# Patient Record
Sex: Male | Born: 1935
Health system: Southern US, Community
[De-identification: ages and names within clinical notes are randomized; demographics above are authoritative.]

## PROBLEM LIST (undated history)

## (undated) DIAGNOSIS — I890 Lymphedema, not elsewhere classified: Secondary | ICD-10-CM

## (undated) DIAGNOSIS — I700 Atherosclerosis of aorta: Secondary | ICD-10-CM

## (undated) DIAGNOSIS — J45909 Unspecified asthma, uncomplicated: Secondary | ICD-10-CM

## (undated) DIAGNOSIS — K449 Diaphragmatic hernia without obstruction or gangrene: Secondary | ICD-10-CM

## (undated) DIAGNOSIS — K635 Polyp of colon: Secondary | ICD-10-CM

## (undated) DIAGNOSIS — I499 Cardiac arrhythmia, unspecified: Secondary | ICD-10-CM

## (undated) DIAGNOSIS — E669 Obesity, unspecified: Secondary | ICD-10-CM

## (undated) DIAGNOSIS — M1990 Unspecified osteoarthritis, unspecified site: Secondary | ICD-10-CM

## (undated) DIAGNOSIS — I350 Nonrheumatic aortic (valve) stenosis: Secondary | ICD-10-CM

## (undated) DIAGNOSIS — I340 Nonrheumatic mitral (valve) insufficiency: Secondary | ICD-10-CM

## (undated) DIAGNOSIS — I10 Essential (primary) hypertension: Secondary | ICD-10-CM

## (undated) DIAGNOSIS — I2510 Atherosclerotic heart disease of native coronary artery without angina pectoris: Secondary | ICD-10-CM

## (undated) DIAGNOSIS — R011 Cardiac murmur, unspecified: Secondary | ICD-10-CM

## (undated) DIAGNOSIS — B351 Tinea unguium: Secondary | ICD-10-CM

## (undated) DIAGNOSIS — I5030 Unspecified diastolic (congestive) heart failure: Secondary | ICD-10-CM

## (undated) DIAGNOSIS — I509 Heart failure, unspecified: Secondary | ICD-10-CM

## (undated) DIAGNOSIS — K219 Gastro-esophageal reflux disease without esophagitis: Secondary | ICD-10-CM

## (undated) DIAGNOSIS — E785 Hyperlipidemia, unspecified: Secondary | ICD-10-CM

## (undated) DIAGNOSIS — L03119 Cellulitis of unspecified part of limb: Secondary | ICD-10-CM

## (undated) HISTORY — DX: Hyperlipidemia, unspecified: E78.5

## (undated) HISTORY — DX: Nonrheumatic aortic (valve) stenosis: I35.0

## (undated) HISTORY — DX: Cellulitis of unspecified part of limb: L03.119

## (undated) HISTORY — DX: Nonrheumatic mitral (valve) insufficiency: I34.0

## (undated) HISTORY — DX: Unspecified asthma, uncomplicated: J45.909

## (undated) HISTORY — DX: Cardiac murmur, unspecified: R01.1

## (undated) HISTORY — DX: Essential (primary) hypertension: I10

## (undated) HISTORY — DX: Gastro-esophageal reflux disease without esophagitis: K21.9

## (undated) HISTORY — DX: Diaphragmatic hernia without obstruction or gangrene: K44.9

## (undated) HISTORY — DX: Unspecified osteoarthritis, unspecified site: M19.90

## (undated) HISTORY — DX: Unspecified diastolic (congestive) heart failure: I50.30

## (undated) HISTORY — DX: Atherosclerosis of aorta: I70.0

## (undated) HISTORY — DX: Heart failure, unspecified: I50.9

## (undated) HISTORY — DX: Obesity, unspecified: E66.9

## (undated) HISTORY — DX: Atherosclerotic heart disease of native coronary artery without angina pectoris: I25.10

## (undated) HISTORY — DX: Cardiac arrhythmia, unspecified: I49.9

## (undated) HISTORY — DX: Tinea unguium: B35.1

## (undated) HISTORY — DX: Polyp of colon: K63.5

## (undated) HISTORY — DX: Lymphedema, not elsewhere classified: I89.0

---

## 1999-05-18 HISTORY — PX: CHOLECYSTECTOMY: SHX55

## 2002-11-15 HISTORY — PX: CARDIAC CATHETERIZATION: SHX172

## 2003-09-21 ENCOUNTER — Other Ambulatory Visit: Payer: Self-pay

## 2006-07-11 ENCOUNTER — Ambulatory Visit: Payer: Self-pay | Admitting: Gastroenterology

## 2007-10-19 ENCOUNTER — Ambulatory Visit: Payer: Self-pay | Admitting: Otolaryngology

## 2008-05-31 ENCOUNTER — Emergency Department: Payer: Self-pay | Admitting: Emergency Medicine

## 2009-02-16 ENCOUNTER — Emergency Department: Payer: Self-pay | Admitting: Emergency Medicine

## 2010-09-24 ENCOUNTER — Ambulatory Visit: Payer: Self-pay | Admitting: Internal Medicine

## 2012-02-23 ENCOUNTER — Ambulatory Visit (INDEPENDENT_AMBULATORY_CARE_PROVIDER_SITE_OTHER): Payer: No Typology Code available for payment source | Admitting: Cardiovascular Disease

## 2012-02-23 ENCOUNTER — Encounter: Payer: Self-pay | Admitting: Cardiovascular Disease

## 2012-02-23 VITALS — BP 130/80 | HR 79 | Ht 71.0 in | Wt 289.2 lb

## 2012-02-23 DIAGNOSIS — R079 Chest pain, unspecified: Secondary | ICD-10-CM

## 2012-02-23 DIAGNOSIS — R9431 Abnormal electrocardiogram [ECG] [EKG]: Secondary | ICD-10-CM

## 2012-02-23 DIAGNOSIS — E785 Hyperlipidemia, unspecified: Secondary | ICD-10-CM

## 2012-02-23 DIAGNOSIS — I359 Nonrheumatic aortic valve disorder, unspecified: Secondary | ICD-10-CM

## 2012-02-23 DIAGNOSIS — E119 Type 2 diabetes mellitus without complications: Secondary | ICD-10-CM

## 2012-02-23 DIAGNOSIS — I10 Essential (primary) hypertension: Secondary | ICD-10-CM

## 2012-02-23 DIAGNOSIS — I350 Nonrheumatic aortic (valve) stenosis: Secondary | ICD-10-CM | POA: Insufficient documentation

## 2012-02-23 DIAGNOSIS — R0602 Shortness of breath: Secondary | ICD-10-CM

## 2012-02-23 NOTE — Assessment & Plan Note (Signed)
Not all measurements are available on recent echocardiogram. Moderate aortic valve stenosis by aortic valve area and pressure gradient. Mean gradient and velocity measurements not available. He is relatively asymptomatic. We have suggested he repeat his echocardiogram every other year with better measurements.

## 2012-02-23 NOTE — Assessment & Plan Note (Signed)
We have encouraged continued exercise, careful diet management in an effort to lose weight. 

## 2012-02-23 NOTE — Patient Instructions (Addendum)
You are doing well. No medication changes were made.  Please call us if you have new issues that need to be addressed before your next appt.  Your physician wants you to follow-up in: 12 months.  You will receive a reminder letter in the mail two months in advance. If you don't receive a letter, please call our office to schedule the follow-up appointment. 

## 2012-02-23 NOTE — Assessment & Plan Note (Signed)
Blood pressure is well controlled on today's visit. No changes made to the medications. 

## 2012-02-23 NOTE — Assessment & Plan Note (Signed)
We have encouraged him to stay on his statin. Goal LDL less than 70.

## 2012-02-23 NOTE — Assessment & Plan Note (Signed)
Right bundle branch block. Recent stress test 2012 showing no ischemia. No further workup at this time. He is asymptomatic. Normal echocardiogram apart from valvular disease.

## 2012-02-23 NOTE — Assessment & Plan Note (Signed)
Shortness of breath is mild, likely secondary to obesity, deconditioning, underlying aortic valve stenosis. We have encouraged him to watch his diet, lose weight, increase his exercise.

## 2012-02-23 NOTE — Progress Notes (Signed)
Patient ID: [name], male    DOB: 29-Nov-1930, 78 y.o.   MRN: [id number]  HPI Comments: [name] is a very pleasant 78 year old gentleman with obesity, diabetes, mild coronary artery disease by cardiac catheterization in 2004, aortic valve stenosis (estimated at moderate with peak gradient 55 mmHg by outside echocardiogram), normal ejection fraction with normal wall motion, minimal carotid arterial disease who presents for evaluation.    He reports that he was told by his primary care physician that he had a heart attack in the past.  He denies any significant chest pain. He does have mild chronic shortness of breath at baseline. He does not exercise on a regular basis but does stay active. 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 He and his wife do not do any regular exercise program.   Cardiac catheterization July 2004 showed 20% disease in the LAD, RCA and left circumflex Stress test in 2012 shows no significant ischemia, attenuation artifact in the septal wall  EKG shows normal sinus rhythm with right bundle branch block,    Outpatient Encounter Prescriptions as of 02/23/2012  Medication Sig Dispense Refill  . clopidogrel (PLAVIX) 75 MG tablet Take 75 mg by mouth daily.      . furosemide (LASIX) 20 MG tablet Take 20 mg by mouth daily.      Marland Kitchen lisinopril (PRINIVIL,ZESTRIL) 20 MG tablet Take 20 mg by mouth daily.      . metFORMIN (GLUCOPHAGE) 500 MG tablet Take 500 mg by mouth 2 (two) times daily with a meal.      . metoprolol (LOPRESSOR) 50 MG tablet Take 50 mg by mouth daily.      . phentermine 37.5 MG capsule Take 37.5 mg by mouth every morning.      . potassium chloride (K-DUR) 10 MEQ tablet Take 10 mEq by mouth daily.      . simvastatin (ZOCOR) 40 MG tablet Take 40 mg by mouth every evening.       Review of Systems  Constitutional: Positive for unexpected weight change.  HENT: Negative.   Eyes: Negative.     Respiratory: Positive for shortness of breath.   Cardiovascular: Negative.   Gastrointestinal: Positive for abdominal distention.  Musculoskeletal: Positive for gait problem.  Skin: Negative.   Neurological: Negative.   Hematological: Negative.   Psychiatric/Behavioral: Negative.   All other systems reviewed and are negative.    BP 130/80  Pulse 79  Ht 5\' 11"  (1.803 m)  Wt 289 lb 4 oz (131.203 kg)  BMI 40.34 kg/m2  Physical Exam  Nursing note and vitals reviewed. Constitutional: He is oriented to person, place, and time. He appears well-developed and well-nourished.       Obese, poor gait.  HENT:  Head: Normocephalic.  Nose: Nose normal.  Mouth/Throat: Oropharynx is clear and moist.  Eyes: Conjunctivae normal are normal. Pupils are equal, round, and reactive to light.  Neck: Normal range of motion. Neck supple. No JVD present.  Cardiovascular: Normal rate, regular rhythm, S1 normal, S2 normal, normal heart sounds and intact distal pulses.  Exam reveals no gallop and no friction rub.   No murmur heard. Pulmonary/Chest: Effort normal and breath sounds normal. No respiratory distress. He has no wheezes. He has no rales. He exhibits no tenderness.  Abdominal: Soft. Bowel sounds are normal. He exhibits no distension. There is no tenderness.       Ventral hernia noted  Musculoskeletal: Normal  range of motion. He exhibits no edema and no tenderness.  Lymphadenopathy:    He has no cervical adenopathy.  Neurological: He is alert and oriented to person, place, and time. Coordination normal.  Skin: Skin is warm and dry. No rash noted. No erythema.  Psychiatric: He has a normal mood and affect. His behavior is normal. Judgment and thought content normal.           Assessment and Plan

## 2012-04-04 ENCOUNTER — Encounter: Payer: Self-pay | Admitting: Cardiovascular Disease

## 2012-06-09 ENCOUNTER — Telehealth: Payer: Self-pay | Admitting: *Deleted

## 2012-06-09 NOTE — Telephone Encounter (Signed)
Pt wife called pt has been having pain in his arm for the last 2 weeks. Wife asking for pain meds.

## 2012-06-09 NOTE — Telephone Encounter (Signed)
Any other suggestions?

## 2012-06-09 NOTE — Telephone Encounter (Signed)
Sounds musculoskeletal Possible rotator cuff? Participate without exam. Could consider seeing orthopedics at [facility] Also could see physical therapy at [facility] Would continue anti-inflammatories with Aleve. Do not try to lift anything heavy, rest the arm

## 2012-06-09 NOTE — Telephone Encounter (Signed)
Pt's wife says pt has had left arm pain that is worse when he raises arm and "cannot raise it away from his side" d/t pain. Has had this x 2 weeks without relief from tylenol, ibuprofen or heat Denies CP or worsening SOB I advised he f/u with PCP about this since it sounds more musculoskeletal Says he is switching from Dr. Hoy Register to another PCP but has not found one yet. I explained we do not generally prescribe pain meds and should see PCP for this/possibly urgent care if no PCP Understanding verb

## 2012-06-12 NOTE — Telephone Encounter (Signed)
Pt's wife informed says pt's arm is "doing better" I offered to refer to ortho but pt says he will "think about it" and call us back if he needs this

## 2013-03-13 ENCOUNTER — Ambulatory Visit: Payer: No Typology Code available for payment source | Admitting: Cardiovascular Disease

## 2013-03-20 ENCOUNTER — Ambulatory Visit (INDEPENDENT_AMBULATORY_CARE_PROVIDER_SITE_OTHER): Payer: No Typology Code available for payment source | Admitting: Cardiovascular Disease

## 2013-03-20 ENCOUNTER — Encounter: Payer: Self-pay | Admitting: Cardiovascular Disease

## 2013-03-20 VITALS — BP 120/80 | HR 72 | Ht 71.0 in | Wt 289.5 lb

## 2013-03-20 DIAGNOSIS — I350 Nonrheumatic aortic (valve) stenosis: Secondary | ICD-10-CM

## 2013-03-20 DIAGNOSIS — I10 Essential (primary) hypertension: Secondary | ICD-10-CM

## 2013-03-20 DIAGNOSIS — I359 Nonrheumatic aortic valve disorder, unspecified: Secondary | ICD-10-CM

## 2013-03-20 DIAGNOSIS — E119 Type 2 diabetes mellitus without complications: Secondary | ICD-10-CM

## 2013-03-20 DIAGNOSIS — E785 Hyperlipidemia, unspecified: Secondary | ICD-10-CM

## 2013-03-20 DIAGNOSIS — R0602 Shortness of breath: Secondary | ICD-10-CM

## 2013-03-20 NOTE — Assessment & Plan Note (Signed)
Cholesterol is at goal on the current lipid regimen. No changes to the medications were made.  

## 2013-03-20 NOTE — Assessment & Plan Note (Signed)
We have encouraged continued exercise, careful diet management in an effort to lose weight. 

## 2013-03-20 NOTE — Assessment & Plan Note (Signed)
Moderate aortic valve stenosis. We will repeat the echocardiogram early in 2015. He is currently asymptomatic

## 2013-03-20 NOTE — Assessment & Plan Note (Signed)
Blood pressure is well controlled on today's visit. No changes made to the medications. 

## 2013-03-20 NOTE — Progress Notes (Signed)
Patient ID: [name], male    DOB: 31-Mar-1931, 79 y.o.   MRN: [id number]  HPI Comments: [name] is a very pleasant 79 year old gentleman with obesity, diabetes, smoking history for 30 years, mild coronary artery disease by cardiac catheterization in 2004, aortic valve stenosis (estimated at moderate with peak gradient 55 mmHg by outside echocardiogram), normal ejection fraction with normal wall motion, minimal carotid arterial disease who presents for evaluation.    In followup today, [name] reports that he is doing relatively well. He does have chronic mild shortness of breath which she attributes to deconditioning and old smoking history. He does go walking down the street without any symptoms of chest discomfort. Does not participate in a regular exercise program.  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   Cardiac catheterization July 2004 showed 20% disease in the LAD, RCA and left circumflex Stress test in 2012 shows no significant ischemia, attenuation artifact in the septal wall  Vitamin D 13, hematocrit 42, creatinine 0.77, total cholesterol 157, HDL 54, LDL 89  EKG shows normal sinus rhythm with heart rate 72 beats per minute, right bundle branch block,    Outpatient Encounter Prescriptions as of 03/20/2013  Medication Sig  . clopidogrel (PLAVIX) 75 MG tablet Take 75 mg by mouth daily.  . furosemide (LASIX) 20 MG tablet Take 20 mg by mouth daily.  Marland Kitchen lisinopril (PRINIVIL,ZESTRIL) 20 MG tablet Take 20 mg by mouth daily.  . metFORMIN (GLUCOPHAGE) 500 MG tablet Take 500 mg by mouth 2 (two) times daily with a meal.  . metoprolol (LOPRESSOR) 50 MG tablet Take 50 mg by mouth daily.  . potassium chloride (K-DUR) 10 MEQ tablet Take 10 mEq by mouth daily.  . [DISCONTINUED] phentermine 37.5 MG capsule Take 37.5 mg by mouth every morning.  . [DISCONTINUED] simvastatin (ZOCOR) 40 MG tablet Take 40 mg by mouth every evening.    Review of Systems   Constitutional: Positive for unexpected weight change.  HENT: Negative.   Eyes: Negative.   Respiratory: Positive for shortness of breath.   Cardiovascular: Negative.   Gastrointestinal: Positive for abdominal distention.  Endocrine: Negative.   Musculoskeletal: Positive for gait problem.  Skin: Negative.   Allergic/Immunologic: Negative.   Neurological: Negative.   Hematological: Negative.   Psychiatric/Behavioral: Negative.   All other systems reviewed and are negative.    BP 120/80  Pulse 72  Ht 5\' 11"  (1.803 m)  Wt 289 lb 8 oz (131.316 kg)  BMI 40.39 kg/m2  Physical Exam  Nursing note and vitals reviewed. Constitutional: He is oriented to person, place, and time. He appears well-developed and well-nourished.  Obese, poor gait.  HENT:  Head: Normocephalic.  Nose: Nose normal.  Mouth/Throat: Oropharynx is clear and moist.  Eyes: Conjunctivae are normal. Pupils are equal, round, and reactive to light.  Neck: Normal range of motion. Neck supple. No JVD present.  Cardiovascular: Normal rate, regular rhythm, S1 normal, S2 normal, normal heart sounds and intact distal pulses.  Exam reveals no gallop and no friction rub.   No murmur heard. Pulmonary/Chest: Effort normal and breath sounds normal. No respiratory distress. He has no wheezes. He has no rales. He exhibits no tenderness.  Abdominal: Soft. Bowel sounds are normal. He exhibits no distension. There is no tenderness.  Ventral hernia noted  Musculoskeletal: Normal range of motion. He exhibits no edema and no tenderness.  Lymphadenopathy:    He has no cervical adenopathy.  Neurological: He  is alert and oriented to person, place, and time. Coordination normal.  Skin: Skin is warm and dry. No rash noted. No erythema.  Psychiatric: He has a normal mood and affect. His behavior is normal. Judgment and thought content normal.      Assessment and Plan

## 2013-03-20 NOTE — Patient Instructions (Addendum)
You are doing well. No medication changes were made.  Please start Vitamin D  We will schedule an echocardiogram for early next year  Please call us if you have new issues that need to be addressed before your next appt.  Your physician wants you to follow-up in: 12 months.  You will receive a reminder letter in the mail two months in advance. If you don't receive a letter, please call our office to schedule the follow-up appointment.

## 2013-03-20 NOTE — Assessment & Plan Note (Signed)
Chronic mild shortness of breath likely from obesity, deconditioning, aortic valve stenosis

## 2013-05-17 DIAGNOSIS — I700 Atherosclerosis of aorta: Secondary | ICD-10-CM

## 2013-05-17 HISTORY — DX: Atherosclerosis of aorta: I70.0

## 2013-05-18 ENCOUNTER — Inpatient Hospital Stay: Payer: Self-pay | Admitting: Internal Medicine

## 2013-05-18 DIAGNOSIS — I2510 Atherosclerotic heart disease of native coronary artery without angina pectoris: Secondary | ICD-10-CM | POA: Diagnosis present

## 2013-05-18 DIAGNOSIS — E785 Hyperlipidemia, unspecified: Secondary | ICD-10-CM | POA: Diagnosis present

## 2013-05-18 DIAGNOSIS — Z87891 Personal history of nicotine dependence: Secondary | ICD-10-CM | POA: Diagnosis not present

## 2013-05-18 DIAGNOSIS — R079 Chest pain, unspecified: Secondary | ICD-10-CM | POA: Diagnosis not present

## 2013-05-18 DIAGNOSIS — R0789 Other chest pain: Secondary | ICD-10-CM | POA: Diagnosis not present

## 2013-05-18 DIAGNOSIS — E119 Type 2 diabetes mellitus without complications: Secondary | ICD-10-CM | POA: Diagnosis present

## 2013-05-18 DIAGNOSIS — I214 Non-ST elevation (NSTEMI) myocardial infarction: Secondary | ICD-10-CM | POA: Diagnosis not present

## 2013-05-18 DIAGNOSIS — J9819 Other pulmonary collapse: Secondary | ICD-10-CM | POA: Diagnosis not present

## 2013-05-18 DIAGNOSIS — E669 Obesity, unspecified: Secondary | ICD-10-CM | POA: Diagnosis not present

## 2013-05-18 DIAGNOSIS — R0602 Shortness of breath: Secondary | ICD-10-CM | POA: Diagnosis not present

## 2013-05-18 DIAGNOSIS — I359 Nonrheumatic aortic valve disorder, unspecified: Secondary | ICD-10-CM | POA: Diagnosis present

## 2013-05-18 DIAGNOSIS — I10 Essential (primary) hypertension: Secondary | ICD-10-CM | POA: Diagnosis not present

## 2013-05-18 DIAGNOSIS — Z6838 Body mass index (BMI) 38.0-38.9, adult: Secondary | ICD-10-CM | POA: Diagnosis not present

## 2013-05-18 LAB — BASIC METABOLIC PANEL
Anion Gap: 6 — ABNORMAL LOW (ref 7–16)
BUN: 11 mg/dL (ref 7–18)
CALCIUM: 9 mg/dL (ref 8.5–10.1)
CHLORIDE: 107 mmol/L (ref 98–107)
Co2: 27 mmol/L (ref 21–32)
Creatinine: 0.86 mg/dL (ref 0.60–1.30)
EGFR (Non-African Amer.): >60
Glucose: 161 mg/dL — ABNORMAL HIGH (ref 65–99)
Osmolality: 282 (ref 275–301)
Potassium: 3.8 mmol/L (ref 3.5–5.1)
Sodium: 140 mmol/L (ref 136–145)

## 2013-05-18 LAB — CBC
HCT: 46 % (ref 40.0–52.0)
HGB: 15.3 g/dL (ref 13.0–18.0)
MCH: 27.3 pg (ref 26.0–34.0)
MCHC: 33.3 g/dL (ref 32.0–36.0)
MCV: 82 fL (ref 80–100)
PLATELETS: 181 10*3/uL (ref 150–440)
RBC: 5.63 10*6/uL (ref 4.40–5.90)
RDW: 14.3 % (ref 11.5–14.5)
WBC: 7.9 10*3/uL (ref 3.8–10.6)

## 2013-05-18 LAB — PROTIME-INR
INR: 1.1
Prothrombin Time: 13.8 secs (ref 11.5–14.7)

## 2013-05-18 LAB — CK TOTAL AND CKMB (NOT AT ARMC)
CK, Total: 150 U/L (ref 35–232)
CK-MB: 8 ng/mL — ABNORMAL HIGH (ref 0.5–3.6)

## 2013-05-18 LAB — TROPONIN I
TROPONIN-I: 2.6 ng/mL — AB
Troponin-I: 2.5 ng/mL — ABNORMAL HIGH

## 2013-05-18 LAB — CK-MB: CK-MB: 10.6 ng/mL — ABNORMAL HIGH (ref 0.5–3.6)

## 2013-05-18 LAB — APTT: ACTIVATED PTT: 30.9 s (ref 23.6–35.9)

## 2013-05-18 LAB — CK: CK, Total: 179 U/L (ref 35–232)

## 2013-05-18 LAB — PRO B NATRIURETIC PEPTIDE: B-Type Natriuretic Peptide: 148 pg/mL (ref 0–450)

## 2013-05-19 DIAGNOSIS — I359 Nonrheumatic aortic valve disorder, unspecified: Secondary | ICD-10-CM | POA: Diagnosis not present

## 2013-05-19 DIAGNOSIS — R079 Chest pain, unspecified: Secondary | ICD-10-CM | POA: Diagnosis not present

## 2013-05-19 DIAGNOSIS — I10 Essential (primary) hypertension: Secondary | ICD-10-CM

## 2013-05-19 DIAGNOSIS — E119 Type 2 diabetes mellitus without complications: Secondary | ICD-10-CM | POA: Diagnosis not present

## 2013-05-19 DIAGNOSIS — I214 Non-ST elevation (NSTEMI) myocardial infarction: Secondary | ICD-10-CM

## 2013-05-19 DIAGNOSIS — E669 Obesity, unspecified: Secondary | ICD-10-CM | POA: Diagnosis not present

## 2013-05-19 HISTORY — PX: TRANSTHORACIC ECHOCARDIOGRAM: SHX275

## 2013-05-19 LAB — LIPID PANEL
Cholesterol: 151 mg/dL (ref 0–200)
HDL Cholesterol: 49 mg/dL (ref 40–60)
LDL CHOLESTEROL, CALC: 90 mg/dL (ref 0–100)
Triglycerides: 58 mg/dL (ref 0–200)
VLDL CHOLESTEROL, CALC: 12 mg/dL (ref 5–40)

## 2013-05-19 LAB — CK-MB: CK-MB: 6.4 ng/mL — ABNORMAL HIGH (ref 0.5–3.6)

## 2013-05-19 LAB — APTT: ACTIVATED PTT: 101.3 s — AB (ref 23.6–35.9)

## 2013-05-19 LAB — CK TOTAL AND CKMB (NOT AT ARMC): CK, Total: 130 U/L (ref 35–232)

## 2013-05-19 LAB — TROPONIN I: Troponin-I: 2 ng/mL — ABNORMAL HIGH

## 2013-05-20 DIAGNOSIS — E119 Type 2 diabetes mellitus without complications: Secondary | ICD-10-CM | POA: Diagnosis not present

## 2013-05-20 DIAGNOSIS — I214 Non-ST elevation (NSTEMI) myocardial infarction: Secondary | ICD-10-CM | POA: Diagnosis not present

## 2013-05-20 DIAGNOSIS — I359 Nonrheumatic aortic valve disorder, unspecified: Secondary | ICD-10-CM | POA: Diagnosis not present

## 2013-05-20 DIAGNOSIS — E669 Obesity, unspecified: Secondary | ICD-10-CM | POA: Diagnosis not present

## 2013-05-20 DIAGNOSIS — I10 Essential (primary) hypertension: Secondary | ICD-10-CM | POA: Diagnosis not present

## 2013-05-20 LAB — HEMATOCRIT: HCT: 40.4 % (ref 40.0–52.0)

## 2013-05-20 LAB — PLATELET COUNT: PLATELETS: 159 10*3/uL (ref 150–440)

## 2013-05-20 LAB — HEMOGLOBIN: HGB: 13.7 g/dL (ref 13.0–18.0)

## 2013-05-20 LAB — APTT: Activated PTT: 102.6 secs — ABNORMAL HIGH (ref 23.6–35.9)

## 2013-05-21 ENCOUNTER — Encounter: Payer: Self-pay | Admitting: Cardiovascular Disease

## 2013-05-21 DIAGNOSIS — E119 Type 2 diabetes mellitus without complications: Secondary | ICD-10-CM | POA: Diagnosis not present

## 2013-05-21 DIAGNOSIS — I10 Essential (primary) hypertension: Secondary | ICD-10-CM | POA: Diagnosis not present

## 2013-05-21 DIAGNOSIS — E669 Obesity, unspecified: Secondary | ICD-10-CM | POA: Diagnosis not present

## 2013-05-21 DIAGNOSIS — I214 Non-ST elevation (NSTEMI) myocardial infarction: Secondary | ICD-10-CM | POA: Diagnosis not present

## 2013-05-21 DIAGNOSIS — I2510 Atherosclerotic heart disease of native coronary artery without angina pectoris: Secondary | ICD-10-CM

## 2013-05-21 DIAGNOSIS — I359 Nonrheumatic aortic valve disorder, unspecified: Secondary | ICD-10-CM | POA: Diagnosis not present

## 2013-05-21 HISTORY — PX: CARDIAC CATHETERIZATION: SHX172

## 2013-05-21 LAB — BASIC METABOLIC PANEL
Anion Gap: 6 — ABNORMAL LOW (ref 7–16)
BUN: 17 mg/dL (ref 7–18)
Calcium, Total: 9.1 mg/dL (ref 8.5–10.1)
Chloride: 105 mmol/L (ref 98–107)
Co2: 29 mmol/L (ref 21–32)
Creatinine: 0.88 mg/dL (ref 0.60–1.30)
EGFR (African American): >60
Glucose: 101 mg/dL — ABNORMAL HIGH (ref 65–99)
OSMOLALITY: 281 (ref 275–301)
Potassium: 3.7 mmol/L (ref 3.5–5.1)
Sodium: 140 mmol/L (ref 136–145)

## 2013-05-21 LAB — APTT: Activated PTT: 91.1 secs — ABNORMAL HIGH (ref 23.6–35.9)

## 2013-05-21 LAB — HEMOGLOBIN A1C: Hemoglobin A1C: 5.9 % (ref 4.2–6.3)

## 2013-05-22 ENCOUNTER — Other Ambulatory Visit: Payer: No Typology Code available for payment source

## 2013-05-22 DIAGNOSIS — E119 Type 2 diabetes mellitus without complications: Secondary | ICD-10-CM | POA: Diagnosis not present

## 2013-05-22 DIAGNOSIS — I10 Essential (primary) hypertension: Secondary | ICD-10-CM | POA: Diagnosis not present

## 2013-05-22 DIAGNOSIS — I214 Non-ST elevation (NSTEMI) myocardial infarction: Secondary | ICD-10-CM | POA: Diagnosis not present

## 2013-05-22 DIAGNOSIS — I359 Nonrheumatic aortic valve disorder, unspecified: Secondary | ICD-10-CM | POA: Diagnosis not present

## 2013-05-22 HISTORY — PX: TRANSESOPHAGEAL ECHOCARDIOGRAM: SHX273

## 2013-05-22 LAB — HEMOGLOBIN: HGB: 13.2 g/dL (ref 13.0–18.0)

## 2013-05-22 LAB — APTT: ACTIVATED PTT: 30.2 s (ref 23.6–35.9)

## 2013-05-22 LAB — PLATELET COUNT: PLATELETS: 152 10*3/uL (ref 150–440)

## 2013-05-23 ENCOUNTER — Other Ambulatory Visit: Payer: Self-pay

## 2013-05-23 DIAGNOSIS — I350 Nonrheumatic aortic (valve) stenosis: Secondary | ICD-10-CM

## 2013-05-23 DIAGNOSIS — I10 Essential (primary) hypertension: Secondary | ICD-10-CM

## 2013-06-04 ENCOUNTER — Encounter: Payer: Medicare Other | Admitting: Cardiovascular Disease

## 2013-06-04 ENCOUNTER — Encounter: Payer: Self-pay | Admitting: Physician Assistant

## 2013-06-04 ENCOUNTER — Encounter: Payer: Self-pay | Admitting: *Deleted

## 2013-06-04 ENCOUNTER — Encounter: Payer: Medicare Other | Admitting: Physician Assistant

## 2013-06-05 ENCOUNTER — Encounter: Payer: Self-pay | Admitting: Physician Assistant

## 2013-06-05 ENCOUNTER — Ambulatory Visit (INDEPENDENT_AMBULATORY_CARE_PROVIDER_SITE_OTHER): Payer: Medicare Other | Admitting: Physician Assistant

## 2013-06-05 VITALS — BP 112/78 | HR 71 | Ht 71.0 in | Wt 283.5 lb

## 2013-06-05 DIAGNOSIS — I350 Nonrheumatic aortic (valve) stenosis: Secondary | ICD-10-CM

## 2013-06-05 DIAGNOSIS — I359 Nonrheumatic aortic valve disorder, unspecified: Secondary | ICD-10-CM | POA: Diagnosis not present

## 2013-06-05 DIAGNOSIS — I10 Essential (primary) hypertension: Secondary | ICD-10-CM | POA: Diagnosis not present

## 2013-06-05 DIAGNOSIS — I2510 Atherosclerotic heart disease of native coronary artery without angina pectoris: Secondary | ICD-10-CM | POA: Insufficient documentation

## 2013-06-05 DIAGNOSIS — R079 Chest pain, unspecified: Secondary | ICD-10-CM

## 2013-06-05 DIAGNOSIS — E119 Type 2 diabetes mellitus without complications: Secondary | ICD-10-CM

## 2013-06-05 DIAGNOSIS — E785 Hyperlipidemia, unspecified: Secondary | ICD-10-CM

## 2013-06-05 MED ORDER — NITROGLYCERIN 0.4 MG SL SUBL: 0.4000 mg | SUBLINGUAL_TABLET | SUBLINGUAL | Status: DC | PRN

## 2013-06-05 MED ORDER — ATORVASTATIN CALCIUM 40 MG PO TABS: 40.0000 mg | ORAL_TABLET | Freq: Every day | ORAL | Status: DC

## 2013-06-05 MED ORDER — METFORMIN HCL 500 MG PO TABS: 500.0000 mg | ORAL_TABLET | Freq: Two times a day (BID) | ORAL | Status: DC

## 2013-06-05 MED ORDER — LISINOPRIL 10 MG PO TABS: 10.0000 mg | ORAL_TABLET | Freq: Every day | ORAL | Status: DC

## 2013-06-05 MED ORDER — FUROSEMIDE 20 MG PO TABS: 20.0000 mg | ORAL_TABLET | Freq: Every day | ORAL | Status: DC

## 2013-06-05 MED ORDER — METOPROLOL SUCCINATE ER 25 MG PO TB24: 25.0000 mg | ORAL_TABLET | Freq: Every day | ORAL | Status: DC

## 2013-06-05 MED ORDER — POTASSIUM CHLORIDE ER 10 MEQ PO TBCR: 10.0000 meq | EXTENDED_RELEASE_TABLET | Freq: Every day | ORAL | Status: DC

## 2013-06-05 MED ORDER — CLOPIDOGREL BISULFATE 75 MG PO TABS: 75.0000 mg | ORAL_TABLET | Freq: Every day | ORAL | Status: DC

## 2013-06-05 NOTE — Assessment & Plan Note (Addendum)
Continue high-potency statin. Check lipid panel, LFTs in 6 weeks.

## 2013-06-05 NOTE — Assessment & Plan Note (Signed)
Nonobstructive by recent cath. No further chest pain. Continue Plavix, BB, statin, NTG SL PRN.

## 2013-06-05 NOTE — Patient Instructions (Signed)
Please take all medications a prescribed. I have sent two weeks' worth of medications to the [facility] on [address]. We will call in 90 days prescriptions through your mail service.  Please establish care with a primary care provider within 1 to 2 weeks. We have provided the contact information for [facility] here in town.   We will see you back in 3 months.

## 2013-06-05 NOTE — Assessment & Plan Note (Signed)
Moderate by TEE on recent admission. No chest pain or shortness of breath. He does have peripheral edema on exam. Will refill Lasix. Continue to monitor AS. Educated on symptoms to monitor.

## 2013-06-05 NOTE — Progress Notes (Signed)
Patient ID: [name], male   DOB: 09/11/1930, 80 y.o.   MRN: [id number]   Date:  06/05/2013   ID:  [name], DOB [birth year], MRN [id number]  PCP:  Evelene Croon, MD  Primary Cardiologist:  Concha Se, MD   History of Present Illness: [name] is a 80 y.o. male with moderate AS, nonobstructive CAD, h/o tobacco abuse, DM2, obesity, diabetes and ASA allergy (hives) who presents today for post-hospital follow-up.  He was admitted at [facility] 1/2 to 05/22/13 for NSTEMI. He reported experiencing intermittent chest discomfort at rest 2 days prior to admission, and presented to the ED for further evaluation at the urging of his family. Serial troponins 2.60->2.50-2.00. He underwent 2D echo- EF 60-65%, impaired diastolic function, mild LA dilatation, mild MR/AI/TR, mod AS, high normal RVSP (30.4 mmHg). He underwent follow-up cardiac catheterization 05/21/13 showing 30% oLM, 30% D1, 20% mLCx, 30% pRCA; EF 60%, severe AS (mean gradient 28 mmHg, peak 26, area 0.9). Note, R radial very tortuous. Difficult to access, avoidance of R radial approach in future recommended. Follow-up TEE the following [place] revealed moderate AS (valve area by planimetry between 1.0-1.26 cm sq), moderate aortic arch and descending aortic atherosclerosis. He was discharged later that [place]. NSTEMI, type 2 was diagnosed due to AS, tachycardia on admission.   Hgb A1C 5.9% HDL 49 LDL 90 TG 58 TC 151  He reports feeling well since discharge. He denies chest pain, SOB/DOE, palpitations or syncope. He has run out of several medications including Plavix and Lasix. No tobacco or EtOH abuse. He is requesting refills. He is looking for a new PCP.  EKG: NSR, 71 bpm, RBBB, LAD, no ST/T changes  Wt Readings from Last 3 Encounters:  06/05/13 283 lb 8 oz (128.595 kg)  03/20/13 289 lb 8 oz (131.316 kg)  02/23/12 289 lb 4 oz (131.203 kg)     Past Medical History  Diagnosis Date  . Hiatal hernia   . Hypertension   . Coronary artery disease       a. Nonobstructive by 2004 cath b. Nonobstructive by 2015 cath  . Hyperlipidemia   . Asthma   . Osteoarthritis   . Obesity   . Onychomycosis   . Diabetes mellitus without complication     a. Hgb A1C 5.9% 05/2013  . Arrhythmia   . Aortic atherosclerosis 05/2013    Per TEE  . MI (myocardial infarction)     Current Outpatient Prescriptions  Medication Sig Dispense Refill  . atorvastatin (LIPITOR) 40 MG tablet Take 40 mg by mouth daily.      . Cholecalciferol (VITAMIN D-3) 5000 UNITS TABS Take by mouth daily.      . clopidogrel (PLAVIX) 75 MG tablet Take 75 mg by mouth daily.      . furosemide (LASIX) 20 MG tablet Take 20 mg by mouth daily.      Marland Kitchen lisinopril (PRINIVIL,ZESTRIL) 20 MG tablet Take 20 mg by mouth daily.      . metFORMIN (GLUCOPHAGE) 500 MG tablet Take 500 mg by mouth 2 (two) times daily with a meal.      . metoprolol tartrate (LOPRESSOR) 25 MG tablet Take 25 mg by mouth daily.      . nitroGLYCERIN (NITROSTAT) 0.4 MG SL tablet Place 0.4 mg under the tongue every 5 (five) minutes as needed for chest pain.      . potassium chloride (K-DUR) 10 MEQ tablet Take 10 mEq by mouth daily.       No current facility-administered medications  for this visit.    Allergies:    Allergies  Allergen Reactions  . Aspirin     Rash     Social History:  The patient  reports that he quit smoking about 32 years ago. His smoking use included Cigarettes. He has a 30 pack-year smoking history. He does not have any smokeless tobacco history on file. He reports that he does not drink alcohol or use illicit drugs.   Family History:  Family History  Problem Relation Age of Onset  . Heart disease Mother   . Heart disease Father [id number]    Review of Systems: General: negative for chills, fever, night sweats or weight changes.  Cardiovascular:  negative for chest pain, dyspnea on exertion, edema, orthopnea, palpitations, paroxysmal nocturnal dyspnea or shortness of breath Dermatological:  negative  for rash Respiratory:  negative for cough or wheezing Urologic: negative for hematuria Abdominal:  negative for nausea, vomiting, diarrhea, bright red blood per rectum, melena, or hematemesis Neurologic: negative for visual changes, syncope, or dizziness All other systems reviewed and are otherwise negative except as noted above.  PHYSICAL EXAM: VS:  BP 112/78  Pulse 71  Ht 5\' 11"  (1.803 m)  Wt 283 lb 8 oz (128.595 kg)  BMI 39.56 kg/m2 Well nourished, well developed, in no acute distress. Ambulates with a cane. HEENT: normal, PERRL Neck: no JVD or bruits Cardiac:  normal S1, S2, III/VI systolic crescendo-decrescendo murmur at RUSB; RRR; no murmur or gallops Lungs:  clear to auscultation bilaterally, no wheezing, rhonchi or rales Abd: soft, central adiposity, nontender, no hepatomegaly, normoactive BS x 4 quads Ext: 1+ bilateral pretibial edema, cyanosis or clubbing Skin: warm and dry, cap refill < 2 sec Neuro:  CNs 2-12 intact, no focal abnormalities noted Musculoskeletal: strength and tone appropriate for age  Psych: normal affect

## 2013-06-05 NOTE — Assessment & Plan Note (Deleted)
Well-controlled. Continue current antihypertensives.  

## 2013-06-05 NOTE — Assessment & Plan Note (Signed)
A1C 5.9%. Refilled metformin while patient looking for PCP.

## 2013-06-05 NOTE — Assessment & Plan Note (Signed)
Well-controlled. Continue current antihypertensives.  

## 2013-06-18 ENCOUNTER — Ambulatory Visit: Payer: Medicare Other | Admitting: Adult Health

## 2013-06-29 ENCOUNTER — Ambulatory Visit (INDEPENDENT_AMBULATORY_CARE_PROVIDER_SITE_OTHER): Payer: Medicare Other | Admitting: Adult Health

## 2013-06-29 ENCOUNTER — Encounter: Payer: Self-pay | Admitting: Adult Health

## 2013-06-29 VITALS — BP 122/60 | HR 55 | Temp 98.1°F | Resp 16 | Ht 67.75 in | Wt 282.5 lb

## 2013-06-29 DIAGNOSIS — Z7189 Other specified counseling: Secondary | ICD-10-CM | POA: Diagnosis not present

## 2013-06-29 DIAGNOSIS — Z7689 Persons encountering health services in other specified circumstances: Secondary | ICD-10-CM

## 2013-06-29 DIAGNOSIS — M25569 Pain in unspecified knee: Secondary | ICD-10-CM | POA: Diagnosis not present

## 2013-06-29 NOTE — Progress Notes (Signed)
Pre visit review using our clinic review tool, if applicable. No additional management support is needed unless otherwise documented below in the visit note. 

## 2013-06-29 NOTE — Patient Instructions (Signed)
   Thank you for choosing [facility] for your [facility] needs.  I will request your medical records from your previous physician.  Please make sure that you have metformin (glucophage) in your home. This medication is for your diabetes.  Please go to the [facility] for your knee xrays. You do not need an appointment for the xrays. They do the xrays between the hours of 8:00 AM and 3:30 PM  Return for a follow up visit in May. Please tell them to schedule a 30 min appointment for you.

## 2013-06-29 NOTE — Progress Notes (Signed)
Patient ID: Archie PattenMonroe Riding, male   DOB: [birth year], 80 y.o.   MRN: [id number]    Subjective:    Patient ID: Archie PattenMonroe Adami, male    DOB: [birth year], 80 y.o.   MRN: [id number]  HPI  Patient is a pleasant 80 year old male who presents to clinic to establish care. Previously followed by Dr. Jim LikeNiemeier. He reports recently seeing his previous PCP with labs. Will request records. Pt reports recent MI where he was admitted at [facility] from 05/18/13 thru 05/22/13. He is followed by Cardiologist, Dr. Mariah MillingGollan. Pertaining this this problem, pt is feeling well. Denies any chest pain or shortness of breath.  Pt reports bilateral knee pain. His right knee bothers him more than the left. Ambulates with a cane. He has not been evaluated for this.    Past Medical History  Diagnosis Date  . Hiatal hernia   . Hypertension   . Coronary artery disease     a. Nonobstructive by 2004 cath b. Nonobstructive by 2015 cath  . Hyperlipidemia   . Asthma   . Osteoarthritis   . Obesity   . Onychomycosis   . Diabetes mellitus without complication     a. Hgb A1C 5.9% 05/2013  . Arrhythmia   . Aortic atherosclerosis 05/2013    Per TEE  . MI (myocardial infarction)   . GERD (gastroesophageal reflux disease)   . Heart murmur   . Colon polyp   . Aortic stenosis      Past Surgical History  Procedure Laterality Date  . Cardiac catheterization  11/2002  . Cardiac catheterization  05/21/2013    showing 30% oLM, 30% D1, 20% mLCx, 30% pRCA; EF 60%, severe AS (mean gradient 28 mmHg, peak 26, area 0.9)  . Transthoracic echocardiogram  05/19/2013    EF 60-65%, impaired diastolic function, mild LA dilatation, mild MR/AI/TR, mod AS, high normal RVSP (30.4 mmHg)  . Transesophageal echocardiogram  05/22/2013    Preserved EF, moderate AS (valve area by planimetry between 1.0-1.26 cm sq), moderate aortic arch and descending aortic atherosclerosis.   . Cholecystectomy  2001     Family History  Problem Relation Age of Onset  . Heart disease Mother    . Heart disease Father [id number]  . Stroke Brother      History   Social History  . Marital Status: Married    Spouse Name: N/A    Number of Children: N/A  . Years of Education: N/A   Occupational History  . Retired Teacher, [employer]      Career military   Social History Main Topics  . Smoking status: Former Smoker -- 1.50 packs/Port for 20 years    Types: Cigarettes    Quit date: 02/22/1981  . Smokeless tobacco: Never Used  . Alcohol Use: No  . Drug Use: No  . Sexual Activity: Not on file   Other Topics Concern  . Not on file   Social History Narrative   Mr. Stovall lives in [place] with his wife. They have a son and daughter. He is retired from [employer]. He enjoys fishing.       Current Outpatient Prescriptions on File Prior to Visit  Medication Sig Dispense Refill  . atorvastatin (LIPITOR) 40 MG tablet Take 1 tablet (40 mg total) by mouth daily.  90 tablet  3  . Cholecalciferol (VITAMIN D-3) 5000 UNITS TABS Take by mouth daily.      . clopidogrel (PLAVIX) 75 MG tablet Take 1 tablet (75 mg total) by mouth  daily.  90 tablet  3  . furosemide (LASIX) 20 MG tablet Take 1 tablet (20 mg total) by mouth daily.  90 tablet  3  . lisinopril (PRINIVIL,ZESTRIL) 10 MG tablet Take 1 tablet (10 mg total) by mouth daily.  90 tablet  3  . metFORMIN (GLUCOPHAGE) 500 MG tablet Take 1 tablet (500 mg total) by mouth 2 (two) times daily with a meal.  28 tablet  0  . metoprolol succinate (TOPROL XL) 25 MG 24 hr tablet Take 1 tablet (25 mg total) by mouth daily.  90 tablet  3  . potassium chloride (K-DUR) 10 MEQ tablet Take 1 tablet (10 mEq total) by mouth daily.  90 tablet  3  . nitroGLYCERIN (NITROSTAT) 0.4 MG SL tablet Place 1 tablet (0.4 mg total) under the tongue every 5 (five) minutes as needed for chest pain.  25 tablet  6   No current facility-administered medications on file prior to visit.     Review of Systems  Constitutional: Negative.   HENT: Negative.   Eyes:  Negative.   Respiratory: Negative.  Negative for cough, chest tightness, shortness of breath and wheezing.   Cardiovascular: Positive for leg swelling.  Gastrointestinal: Positive for constipation (improved with MOM).  Endocrine: Negative.   Genitourinary: Negative.   Musculoskeletal: Positive for arthralgias (bilateral knee pain).  Skin: Negative.   Allergic/Immunologic: Negative.   Neurological: Negative.   Hematological: Negative.   Psychiatric/Behavioral: Negative.        Objective:  BP 122/60  Pulse 55  Temp(Src) 98.1 F (36.7 C) (Oral)  Resp 16  Ht 5' 7.75" (1.721 m)  Wt 282 lb 8 oz (128.141 kg)  BMI 43.26 kg/m2  SpO2 97%   Physical Exam  Constitutional: He is oriented to person, place, and time.  Pleasant 80 y/o male in NAD  Cardiovascular: Normal rate and regular rhythm.  Exam reveals no gallop.   Murmur (systolic murmur) heard. Pulmonary/Chest: Effort normal and breath sounds normal. No respiratory distress. He has no wheezes. He has no rales.  Abdominal:  Large, rounded abdomen  Neurological: He is alert and oriented to person, place, and time.  Slight HOH  Skin: Skin is warm and dry.  Psychiatric: He has a normal mood and affect. His behavior is normal. Judgment and thought content normal.        Assessment & Plan:   1. Pain in knee Ambulating with cane. Has pain which is worse in the right than the left. Will get xrays and refer as needed.  - DG Knee Complete 4 Views Left; Future - DG Knee Complete 4 Views Right; Future  2. Encounter to establish care Reviewed patient health history. Will request records from previous PCP. Recent labs drawn. I will have him return in May for follow up of chronic problems. Will need 30 min visit.

## 2013-07-10 ENCOUNTER — Telehealth: Payer: Self-pay | Admitting: Adult Health

## 2013-07-10 NOTE — Telephone Encounter (Signed)
Pt called needing a rx for meformin [facility] script Pt is out of meds

## 2013-07-11 ENCOUNTER — Ambulatory Visit (INDEPENDENT_AMBULATORY_CARE_PROVIDER_SITE_OTHER)
Admission: RE | Admit: 2013-07-11 | Discharge: 2013-07-11 | Disposition: A | Discharge status: Home / Self Care | Payer: Medicare Other | Source: Ambulatory Visit | Attending: Adult Health | Admitting: Adult Health

## 2013-07-11 DIAGNOSIS — M25569 Pain in unspecified knee: Secondary | ICD-10-CM

## 2013-07-11 DIAGNOSIS — IMO0002 Reserved for concepts with insufficient information to code with codable children: Secondary | ICD-10-CM | POA: Diagnosis not present

## 2013-07-11 DIAGNOSIS — M1710 Unilateral primary osteoarthritis, unspecified knee: Secondary | ICD-10-CM | POA: Diagnosis not present

## 2013-07-11 MED ORDER — METFORMIN HCL 500 MG PO TABS: 500.0000 mg | ORAL_TABLET | Freq: Two times a day (BID) | ORAL | Status: DC

## 2013-07-11 NOTE — Telephone Encounter (Signed)
Spoke with pt, needed Rx sent to mail order, also sent [facility] mth supply to [facility] since pt was out of medication

## 2013-07-18 ENCOUNTER — Telehealth: Payer: Self-pay | Admitting: Adult Health

## 2013-07-18 NOTE — Telephone Encounter (Addendum)
The patient's wife is calling for his x ray results. The patient is hard of hearing please speak with his wife.

## 2013-07-18 NOTE — Telephone Encounter (Signed)
Advised pt of xray results, advised that we could give a ortho referral, pt agreed to see ortho.

## 2013-07-19 ENCOUNTER — Other Ambulatory Visit: Payer: Self-pay | Admitting: Adult Health

## 2013-07-19 DIAGNOSIS — M25561 Pain in right knee: Secondary | ICD-10-CM

## 2013-07-19 DIAGNOSIS — M25562 Pain in left knee: Principal | ICD-10-CM

## 2013-07-27 DIAGNOSIS — M79609 Pain in unspecified limb: Secondary | ICD-10-CM | POA: Diagnosis not present

## 2013-07-27 DIAGNOSIS — B351 Tinea unguium: Secondary | ICD-10-CM | POA: Diagnosis not present

## 2013-08-02 DIAGNOSIS — IMO0002 Reserved for concepts with insufficient information to code with codable children: Secondary | ICD-10-CM | POA: Diagnosis not present

## 2013-08-02 DIAGNOSIS — M1710 Unilateral primary osteoarthritis, unspecified knee: Secondary | ICD-10-CM | POA: Diagnosis not present

## 2013-12-19 ENCOUNTER — Ambulatory Visit: Admitting: Cardiovascular Disease

## 2014-03-18 ENCOUNTER — Encounter: Payer: Self-pay | Admitting: Cardiovascular Disease

## 2014-03-18 ENCOUNTER — Ambulatory Visit (INDEPENDENT_AMBULATORY_CARE_PROVIDER_SITE_OTHER): Payer: Medicare Other | Admitting: Cardiovascular Disease

## 2014-03-18 VITALS — BP 118/68 | HR 89 | Ht 71.0 in | Wt 280.5 lb

## 2014-03-18 DIAGNOSIS — E1159 Type 2 diabetes mellitus with other circulatory complications: Secondary | ICD-10-CM

## 2014-03-18 DIAGNOSIS — I10 Essential (primary) hypertension: Secondary | ICD-10-CM | POA: Diagnosis not present

## 2014-03-18 DIAGNOSIS — I2510 Atherosclerotic heart disease of native coronary artery without angina pectoris: Secondary | ICD-10-CM

## 2014-03-18 DIAGNOSIS — M25561 Pain in right knee: Secondary | ICD-10-CM

## 2014-03-18 DIAGNOSIS — I25119 Atherosclerotic heart disease of native coronary artery with unspecified angina pectoris: Secondary | ICD-10-CM | POA: Diagnosis not present

## 2014-03-18 DIAGNOSIS — I350 Nonrheumatic aortic (valve) stenosis: Secondary | ICD-10-CM | POA: Diagnosis not present

## 2014-03-18 DIAGNOSIS — M25562 Pain in left knee: Secondary | ICD-10-CM

## 2014-03-18 DIAGNOSIS — E785 Hyperlipidemia, unspecified: Secondary | ICD-10-CM

## 2014-03-18 MED ORDER — ATORVASTATIN CALCIUM 40 MG PO TABS: 40.0000 mg | ORAL_TABLET | Freq: Every day | ORAL | Status: DC

## 2014-03-18 MED ORDER — CLOPIDOGREL BISULFATE 75 MG PO TABS: 75.0000 mg | ORAL_TABLET | Freq: Every day | ORAL | Status: DC

## 2014-03-18 MED ORDER — FUROSEMIDE 20 MG PO TABS: 20.0000 mg | ORAL_TABLET | Freq: Every day | ORAL | Status: DC

## 2014-03-18 MED ORDER — POTASSIUM CHLORIDE ER 10 MEQ PO TBCR: 10.0000 meq | EXTENDED_RELEASE_TABLET | Freq: Every day | ORAL | Status: DC | PRN

## 2014-03-18 MED ORDER — POTASSIUM CHLORIDE ER 10 MEQ PO TBCR: 10.0000 meq | EXTENDED_RELEASE_TABLET | Freq: Every day | ORAL | Status: DC

## 2014-03-18 MED ORDER — METOPROLOL SUCCINATE ER 25 MG PO TB24: 25.0000 mg | ORAL_TABLET | Freq: Every day | ORAL | Status: DC

## 2014-03-18 MED ORDER — LISINOPRIL 10 MG PO TABS: 10.0000 mg | ORAL_TABLET | Freq: Every day | ORAL | Status: DC

## 2014-03-18 MED ORDER — FUROSEMIDE 20 MG PO TABS: 20.0000 mg | ORAL_TABLET | Freq: Every day | ORAL | Status: DC | PRN

## 2014-03-18 NOTE — Assessment & Plan Note (Signed)
Currently with no symptoms of angina. No further workup at this time. Continue current medication regimen. 

## 2014-03-18 NOTE — Assessment & Plan Note (Signed)
Encouraged him to stay on his Lipitor. Goal LDL less than 70 

## 2014-03-18 NOTE — Progress Notes (Signed)
Patient ID: Jeffery BenjaminMonroe N [name], male    DOB: 26-Apr-1931, 80 y.o.   MRN: [id number]  HPI Comments: [name] is a very pleasant 80 year old gentleman with obesity, diabetes, smoking history for 30 years, mild coronary artery disease by cardiac catheterization in 2004, aortic valve stenosis (estimated at moderate with peak gradient 55 mmHg by outside echocardiogram) moderate by TEE in January 2015, normal ejection fraction with normal wall motion, minimal carotid arterial disease who presents for routine follow-up  In followup today, [name] reports that he is doing relatively well.  He reports that his wife recently had bilateral stroke. She is in a nursing home. Significant stress secondary to this.  He continues to have chronic mild shortness of breath which he attributes to deconditioning and old smoking history.  He is not doing any regular exercise.weight continues to be a problem. Chronic lower extremity swelling, right greater than left He does not take Lasix. 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   Cardiac catheterization July 2004 showed 20% disease in the LAD, RCA and left circumflex Stress test in 2012 shows no significant ischemia, attenuation artifact in the septal wall  Vitamin D 13, hematocrit 42, creatinine 0.77, total cholesterol 157, HDL 54, LDL 89  EKG shows normal sinus rhythm with heart rate 89 beats per minute, right bundle branch block, APCs   Outpatient Encounter Prescriptions as of 03/18/2014  Medication Sig  . atorvastatin (LIPITOR) 40 MG tablet Take 1 tablet (40 mg total) by mouth daily.  . Cholecalciferol (VITAMIN D-3) 5000 UNITS TABS Take by mouth daily.  . clopidogrel (PLAVIX) 75 MG tablet Take 1 tablet (75 mg total) by mouth daily.  . furosemide (LASIX) 20 MG tablet Take 1 tablet (20 mg total) by mouth daily.  Marland Kitchen. lisinopril (PRINIVIL,ZESTRIL) 10 MG tablet Take 1 tablet (10 mg total)  by mouth daily.  . metFORMIN (GLUCOPHAGE) 500 MG tablet Take 1 tablet (500 mg total) by mouth 2 (two) times daily with a meal.  . metoprolol succinate (TOPROL XL) 25 MG 24 hr tablet Take 1 tablet (25 mg total) by mouth daily.  . nitroGLYCERIN (NITROSTAT) 0.4 MG SL tablet Place 1 tablet (0.4 mg total) under the tongue every 5 (five) minutes as needed for chest pain.  . potassium chloride (K-DUR) 10 MEQ tablet Take 1 tablet (10 mEq total) by mouth daily.    Review of Systems  Constitutional: Negative.   HENT: Negative.  Congestion: Keithwe continue penicillin we'll aggressively to be a 50.   Eyes: Negative.   Respiratory: Negative.   Cardiovascular: Positive for leg swelling.  Gastrointestinal: Negative.   Endocrine: Negative.   Musculoskeletal: Positive for gait problem.  Skin: Negative.   Allergic/Immunologic: Negative.   Neurological: Negative.   Hematological: Negative.   Psychiatric/Behavioral: Negative.   All other systems reviewed and are negative.   BP 118/68 mmHg  Pulse 89  Ht 5\' 11"  (1.803 m)  Wt 280 lb 8 oz (127.234 kg)  BMI 39.14 kg/m2  Physical Exam  Constitutional: He is oriented to person, place, and time. He appears well-developed and well-nourished.  HENT:  Head: Normocephalic.  Nose: Nose normal.  Mouth/Throat: Oropharynx is clear and moist.  Eyes: Conjunctivae are normal. Pupils are equal, round, and reactive to light.  Neck: Normal range of motion. Neck supple. No JVD present.  Cardiovascular: Normal rate, regular rhythm, S1 normal, S2 normal, normal heart sounds and intact distal  pulses.  Exam reveals no gallop and no friction rub.   No murmur heard. Pulmonary/Chest: Effort normal and breath sounds normal. No respiratory distress. He has no wheezes. He has no rales. He exhibits no tenderness.  Abdominal: Soft. Bowel sounds are normal. He exhibits no distension. There is no tenderness.  Musculoskeletal: Normal range of motion. He exhibits no edema or  tenderness.  Lymphadenopathy:    He has no cervical adenopathy.  Neurological: He is alert and oriented to person, place, and time. Coordination normal.  Skin: Skin is warm and dry. No rash noted. No erythema.  Psychiatric: He has a normal mood and affect. His behavior is normal. Judgment and thought content normal.      Assessment and Plan   Nursing note and vitals reviewed.

## 2014-03-18 NOTE — Patient Instructions (Addendum)
You are doing well. No medication changes were made.  Please continue a walking program  If the leg swelling gets worse or worsening shortness of breath, take a furosemide/lasix with potassium  Please call us if you have new issues that need to be addressed before your next appt.  Your physician wants you to follow-up in: 6 months.  You will receive a reminder letter in the mail two months in advance. If you don't receive a letter, please call our office to schedule the follow-up appointment.  Your next appointment will be scheduled in our new office located at :  [facility]  [address], [address]  [place], [place] [id number]

## 2014-03-18 NOTE — Assessment & Plan Note (Signed)
Blood pressure is well controlled on today's visit. No changes made to the medications. 

## 2014-03-18 NOTE — Assessment & Plan Note (Signed)
Moderate by TEE on recent admission. No chest pain or shortness of breath. Continue to monitor AS. Educated on symptoms to monitor.

## 2014-03-18 NOTE — Assessment & Plan Note (Signed)
We have encouraged continued exercise, careful diet management in an effort to lose weight. 

## 2014-03-18 NOTE — Assessment & Plan Note (Signed)
He continues to have chronic knee pain. Prior history of cortisone shot in his knee

## 2014-05-26 ENCOUNTER — Emergency Department: Payer: Self-pay | Admitting: Emergency Medicine

## 2014-05-26 DIAGNOSIS — Z79899 Other long term (current) drug therapy: Secondary | ICD-10-CM | POA: Diagnosis not present

## 2014-05-26 DIAGNOSIS — H6692 Otitis media, unspecified, left ear: Secondary | ICD-10-CM | POA: Diagnosis not present

## 2014-05-26 DIAGNOSIS — R51 Headache: Secondary | ICD-10-CM | POA: Diagnosis not present

## 2014-05-26 DIAGNOSIS — H6122 Impacted cerumen, left ear: Secondary | ICD-10-CM | POA: Diagnosis not present

## 2014-05-26 DIAGNOSIS — Z7902 Long term (current) use of antithrombotics/antiplatelets: Secondary | ICD-10-CM | POA: Diagnosis not present

## 2014-05-26 DIAGNOSIS — Z792 Long term (current) use of antibiotics: Secondary | ICD-10-CM | POA: Diagnosis not present

## 2014-05-26 DIAGNOSIS — E119 Type 2 diabetes mellitus without complications: Secondary | ICD-10-CM | POA: Diagnosis not present

## 2014-05-26 DIAGNOSIS — Z87891 Personal history of nicotine dependence: Secondary | ICD-10-CM | POA: Diagnosis not present

## 2014-05-26 DIAGNOSIS — I10 Essential (primary) hypertension: Secondary | ICD-10-CM | POA: Diagnosis not present

## 2014-05-26 LAB — COMPREHENSIVE METABOLIC PANEL
ANION GAP: 8 (ref 7–16)
Albumin: 3.3 g/dL — ABNORMAL LOW (ref 3.4–5.0)
Alkaline Phosphatase: 75 U/L
BUN: 16 mg/dL (ref 7–18)
Bilirubin,Total: 1.2 mg/dL — ABNORMAL HIGH (ref 0.2–1.0)
CALCIUM: 8.8 mg/dL (ref 8.5–10.1)
Chloride: 106 mmol/L (ref 98–107)
Co2: 27 mmol/L (ref 21–32)
Creatinine: 0.87 mg/dL (ref 0.60–1.30)
EGFR (African American): >60
GLUCOSE: 100 mg/dL — AB (ref 65–99)
Osmolality: 283 (ref 275–301)
Potassium: 3.6 mmol/L (ref 3.5–5.1)
SGOT(AST): 22 U/L (ref 15–37)
SGPT (ALT): 17 U/L
SODIUM: 141 mmol/L (ref 136–145)
Total Protein: 6.8 g/dL (ref 6.4–8.2)

## 2014-05-26 LAB — CBC
HCT: 42 % (ref 40.0–52.0)
HGB: 13.6 g/dL (ref 13.0–18.0)
MCH: 26.8 pg (ref 26.0–34.0)
MCHC: 32.5 g/dL (ref 32.0–36.0)
MCV: 83 fL (ref 80–100)
Platelet: 160 10*3/uL (ref 150–440)
RBC: 5.08 10*6/uL (ref 4.40–5.90)
RDW: 14.8 % — ABNORMAL HIGH (ref 11.5–14.5)
WBC: 6.1 10*3/uL (ref 3.8–10.6)

## 2014-05-26 LAB — SEDIMENTATION RATE: Erythrocyte Sed Rate: 13 mm/hr (ref 0–20)

## 2014-07-22 DIAGNOSIS — M25562 Pain in left knee: Secondary | ICD-10-CM | POA: Diagnosis not present

## 2014-08-06 DIAGNOSIS — M1711 Unilateral primary osteoarthritis, right knee: Secondary | ICD-10-CM | POA: Diagnosis not present

## 2014-08-06 DIAGNOSIS — M25562 Pain in left knee: Secondary | ICD-10-CM | POA: Diagnosis not present

## 2014-08-06 DIAGNOSIS — G8929 Other chronic pain: Secondary | ICD-10-CM | POA: Diagnosis not present

## 2014-09-07 NOTE — H&P (Signed)
PATIENT NAME:  [name], RUZ MR#:  [id number] DATE OF BIRTH:  30-Jun-1930  DATE OF ADMISSION:  05/18/2013  REFERRING PHYSICIAN:  Dr. Margarita Grizzle.  PRIMARY CARE PHYSICIAN:  Nonlocal,  CARDIOLOGIST:  Dr. Mariah Milling.  CHIEF COMPLAINT:   Chest pain.  An 81 year old gentleman with past medical history of coronary artery disease, hypertension, hyperlipidemia, type 2 diabetes, non-insulin-requiring; presenting with chest pain. He describes his symptoms originally occurring approximately 1 Arpin prior to arrival, currently located over the left chest, lasting a total of 1 to 2 hours. He describes this only as discomfort. Could not further qualify his pain, rated between 6 and 7 out of 10 without radiation.  He also has some mild associated shortness of breath. Symptoms completely resolved ; however, did occur at rest at that time. He had a recurrence of his symptoms the [place] of admission at the urgency of his family, he came to the Emergency Department for further workup and evaluation. Currently, he is without complaints.  REVIEW OF SYSTEMS:    CONSTITUTIONAL: Denies fever, fatigue, weakness.  EYES: Denies blurred vision, double vision, eye pain.  EARS, NOSE, THROAT: Denies tinnitus, ear pain or hearing loss.  RESPIRATORY: Denies cough, wheeze or painful respiration.  CARDIOVASCULAR: Positive for chest pain as described; however, he denies any orthopnea, edema, palpitation.  GASTROINTESTINAL: Denies nausea, vomiting, diarrhea, abdominal pain. GENITOURINARY:   Denies dysuria or hematuria.  ENDOCRINE: Denies nocturia or thyroid problems.  HEMATOLOGIC AND LYMPHATICS:  Denies easy bruising or bleeding.  SKIN: Denies rash or lesions.  MUSCULOSKELETAL: Denies pain in neck, back, shoulder, knees, hips or arthritic symptoms.   NEUROLOGIC:  Denies paralysis, paresthesias. PSYCHIATRIC:  Denies any anxiety or depressive symptoms.  Otherwise, full review of systems performed by me is negative.   PAST MEDICAL HISTORY:  Type 2 diabetes, non-insulin-requiring; hyperlipidemia, coronary artery disease, hypertension.   SOCIAL HISTORY: Remote history of tobacco usage. Was in the [employer] for 20 years.  Denies any alcohol usage.   FAMILY HISTORY: Denies any knowledge of cardiovascular disease or sudden cardiac death.   ALLERGIES: ASPIRIN.   HOME MEDICATIONS: He does not know any of his medications, though he is not on insulin. Will review his medications when his family arrives with medication list.   PHYSICAL EXAMINATION: VITAL SIGNS: Temperature 98.3, heart rate 96, respirations 20, blood pressure 150/70, saturating 96% on room air. Weight 127 kg. BMI is 39.1.  GENERAL: Well-nourished, well-built gentleman in no acute distress.  HEAD: Normocephalic, atraumatic.  EYES: Pupils equal, round and reactive to light. Extraocular muscles intact. No scleral icterus.  MOUTH: Moist mucosal membranes. Dentition intact. No abscess noted.  EAR, NOSE, THROAT:  Throat clear without exudates. No external lesions.  NECK: Supple. No thyromegaly. No nodules. No JVD.  PULMONARY: Clear to auscultation bilaterally. No wheezes, rubs or rhonchi. No use of accessory muscles. Good respiratory effort.  CHEST: Nontender to palpation.  CARDIOVASCULAR: S1, S2.  Regular rate, regular rhythm with a 3 out of 6 systolic ejection murmur. No edema. Pedal pulses 2+ bilaterally.  GASTROINTESTINAL: Soft, nontender, nondistended. Positive bowel sounds. No hepatosplenomegaly.  MUSCULOSKELETAL: No swelling, clubbing, edema. Range of motion full in all extremities.  NEUROLOGIC: Cranial nerves II through XII intact. No gross focal neurological deficits. Sensation intact. Reflexes intact. SKIN: No ulcerations, lesions, rashes or cyanosis. Skin warm and dry. Turgor is intact. PSYCHIATRIC:  Mood and affect within normal. The patient is awake, alert and oriented x 3. Insight and judgment intact.   LABORATORY DATA: Sodium 140, potassium 3.8,  chloride 107, bicarb  27, BUN 11, creatinine 0.86, glucose 161. Troponin I 2.6, CK-MB 10.6. WBC 7.9, hemoglobin 15.3, platelets 181. EKG performed revealing sinus tachycardia, heart rate of 112 with occasional PVCs with a right bundle branch block.   ASSESSMENT AND PLAN: An 81 year old gentleman with history of coronary artery disease, hypertension, hyperlipidemia, type 2 diabetes, who presented with chest pain. 1.  Non-ST elevation myocardial infarction. HE HAS AN ALLERGY TO ASPIRIN, thus did not receive this.  He is currently chest pain-free. He, however, does not know any of his medications. We will admit to cardiac telemetry, trend his cardiac enzymes. His cardiologist, Dr. Mariah MillingGollan, is consulted. We will check a transthoracic echocardiogram. He has been placed on a heparin drip, and we will add statin therapy to his medications and Lipitor.  2.  Type 2 diabetes. We will hold all p.o. agents. We will add insulin sliding scale with q.6 hour Accu-Cheks.  3.  Hypertension. He is on no medications. We will add hydralazine if needed. Currently, blood pressure is well within normal limits.  4.  Hyperlipidemia.  We will add statin therapy as described above with Lipitor.  5.  Venous thromboembolism prophylaxis.  He will be on a heparin drip.   The patient is full code.   TIME SPENT: 45 minutes    ____________________________ Cletis Athensavid K. Yaroslav Gombos, MD dkh:dmm D: 05/18/2013 20:26:00 ET T: 05/18/2013 21:02:06 ET JOB#: [id number]  cc: Cletis Athensavid K. Ezekiel Menzer, MD, <Dictator> Gordon Carlson Synetta ShadowK Julias Mould MD ELECTRONICALLY SIGNED 05/19/2013 20:19

## 2014-09-07 NOTE — Discharge Summary (Signed)
PATIENT NAME:  Jeffery CoriaDAY, Gabriel N MR#:  [id number] DATE OF BIRTH:  June 26, 1930  DATE OF ADMISSION:  05/18/2013 DATE OF DISCHARGE:  05/22/2013  DISCHARGE DIAGNOSES: 1.  Non-ST segment elevation myocardial infarction.  2.  Aortic valve stenosis, moderate.  3.  Diabetes.  4.  Hypertension.   IMAGING STUDIES: Include a chest x-ray, portable, which showed vascular crowding, atelectasis.   PROCEDURES:  1.  Cardiac catheterization showed no angiographic evidence for occlusive coronary artery disease. EF of 60%.  2.  Echocardiogram showed EF of 60% to 65% impaired relaxation moderate aortic valve stenosis.  3.  TEE showed moderate aortic valve stenosis.   ADMITTING HISTORY AND PHYSICAL: Please see detailed H and P dictated by Dr. Clint GuyHower previously. In brief, an 81 year old African American male patient with history of coronary artery disease, hypertension, hyperlipidemia, and diabetes, presented to the hospital complaining of chest pain. The patient was admitted to the hospitalist service after he had elevated troponin with diagnosis of NSTEMI. His troponins were 2.6, 2.5 and 2. The patient had a cardiac catheter which showed no significant occlusion. His NSTEMI was thought to be likely secondary to small vessel disease. He had an echocardiogram done, which showed moderate aortic stenosis but catheterization suggested severe aortic stenosis after which he was followed by TEE which showed moderate aortic stenosis. No recommendation for valve replacement at this time. He needs follow up with Cardiology. His hypertension and diabetes were well controlled during the hospital stay.   Prior to discharge, the patient's examination showed no chest wall tenderness. Cardiac examination showed S1 and S2 with a systolic murmur.   He is chest pain-free by the Bright of discharge.   DISCHARGE MEDICATIONS: Include:  1.  Lasix 20 mg oral once a [place].  2.  Lisinopril 10 mg oral once a [place].  3.  Potassium chloride 10 mEq oral  once a [place].  4.  Toprol-XL 25 mg oral once a [place].  6.  Atorvastatin 40 mg oral once a Galan.  7.  Metformin 5 mg oral 2 times a [place].  8.  Nitroglycerin 0.4 sublingual as needed for chest pain.   DISCHARGE INSTRUCTIONS: Low-sodium, low-fat, low-cholesterol diet. Activity as tolerated. Follow up with Dr. Mariah MillingGollan with cardiology in 1 to 2 weeks.   TIME SPENT: On [place] of discharge in discharge activity was 42 minutes     ____________________________ Molinda BailiffSrikar R. Johnnie Moten, MD srs:dp D: 05/23/2013 13:36:00 ET T: 05/23/2013 15:19:52 ET JOB#: [id number]  cc: Wardell HeathSrikar R. Elpidio AnisSudini, MD, <Dictator> Antonieta Ibaimothy J. Gollan, MD Orie FishermanSRIKAR R Rindi Beechy MD ELECTRONICALLY SIGNED 05/24/2013 11:13

## 2014-09-07 NOTE — Consult Note (Signed)
General Aspect [name] is a very pleasant 81 year old gentleman with obesity, diabetes, smoking history for 30 years, mild coronary artery disease by cardiac catheterization in 2004, aortic valve stenosis (estimated at moderate with peak gradient 55 mmHg by outside echocardiogram  11/2011), normal ejection fraction with normal wall motion, minimal carotid arterial disease who presents for chest pain, NSTEMI.    He describes his chest pain symptoms originally occurring approximately 1 [place] prior to arrival, currently located over the left chest, lasting a total of 1 to 2 hours. It is a discomfort,  6 out of 10 without radiation, mild associated shortness of breath. He had a recurrence of his symptoms the [place] of admission and at the urging of his family, he came to the Emergency Department for further workup and evaluation.   In the ER troponin>2. He was comfortable on arrival.  last seen in clinic 11/14.  he was doing relatively well. He does have chronic mild shortness of breath which he attributes to deconditioning and old smoking history. He does go walking down the street without any symptoms of chest discomfort. Does not participate in a regular exercise program.   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   Cardiac catheterization July 2004 showed 20% disease in the LAD, RCA and left circumflex Stress test in 2012 shows no significant ischemia, attenuation artifact in the septal wall  Vitamin D 13, hematocrit 42, creatinine 0.77, total cholesterol 157, HDL 54, LDL 89  Previous EKG shows normal sinus rhythm with heart rate 72 beats per minute, right bundle branch block   Present Illness . Outpatient Encounter Prescriptions as of 03/20/2013   ???  clopidogrel (PLAVIX) 75 MG tablet  Take 75 mg by mouth daily.   ???  furosemide (LASIX) 20 MG tablet  Take 20 mg by mouth daily.   ???  lisinopril (PRINIVIL,ZESTRIL) 20 MG tablet  Take 20 mg by mouth daily.    ???  metFORMIN (GLUCOPHAGE) 500 MG tablet  Take 500 mg by mouth 2 (two) times daily with a meal.   ???  metoprolol (LOPRESSOR) 50 MG tablet  Take 50 mg by mouth daily.   ???  potassium chloride (K-DUR) 10 MEQ tablet  Take 10 mEq by mouth daily.  SOCIAL HISTORY: Remote history of tobacco usage. Was in the [employer] for 20 years.  Denies any alcohol usage.   FAMILY HISTORY: Denies any knowledge of cardiovascular disease or sudden cardiac death.   Physical Exam:  GEN well developed, well nourished, no acute distress, obese   HEENT hearing intact to voice, moist oral mucosa   NECK supple   RESP normal resp effort  clear BS   CARD Regular rate and rhythm  Murmur   Murmur Systolic   Systolic Murmur Out flow   ABD denies tenderness  no hernia   LYMPH negative neck   EXTR negative edema   SKIN normal to palpation   NEURO motor/sensory function intact   PSYCH alert, A+O to time, place, person, good insight   Review of Systems:  Subjective/Chief Complaint chest pain thursday, ABD discomfort Friday, feels ok now, mild SOB particularly with exertion.   General: No Complaints   Skin: No Complaints   ENT: No Complaints   Eyes: No Complaints   Neck: No Complaints   Respiratory: Short of breath   Cardiovascular: Chest pain or discomfort   Gastrointestinal: No Complaints   Genitourinary: No Complaints   Vascular: No Complaints  Musculoskeletal: No Complaints   Neurologic: No Complaints   Hematologic: No Complaints   Endocrine: No Complaints   Psychiatric: No Complaints   Review of Systems: All other systems were reviewed and found to be negative   Medications/Allergies Reviewed Medications/Allergies reviewed     Diabetes:    Hypercholesterolemia:    Hypertension:    CAD:    Gall Bladder:        Admit Diagnosis:   NSTEMI: Onset Date: 19-May-2013, Status: Active, Description: NSTEMI  Lab Results:  Routine Chem:  02-Jan-15 18:01   Result Comment  TROPONIN - RESULTS VERIFIED BY REPEAT TESTING.  - C/ERICA COLLINS AT 1836 05/18/13.PMH  - READ-BACK PROCESS PERFORMED.  Result(s) reported on 18 May 2013 at 06:49PM.  Glucose, Serum  161  BUN 11  Creatinine (comp) 0.86  Sodium, Serum 140  Potassium, Serum 3.8  Chloride, Serum 107  CO2, Serum 27  Calcium (Total), Serum 9.0  Anion Gap  6  Osmolality (calc) 282  eGFR (African American) >60  eGFR (Non-African American) >60 (eGFR values <7m/min/1.73 m2 may be an indication of chronic kidney disease (CKD). Calculated eGFR is useful in patients with stable renal function. The eGFR calculation will not be reliable in acutely ill patients when serum creatinine is changing rapidly. It is not useful in  patients on dialysis. The eGFR calculation may not be applicable to patients at the low and high extremes of body sizes, pregnant women, and vegetarians.)  B-Type Natriuretic Peptide ([facility] 148 (Result(s) reported on 18 May 2013 at 06:37PM.)  Cardiac:  02-Jan-15 18:01   Troponin I  2.60 (0.00-0.05 0.05 ng/mL or less: NEGATIVE  Repeat testing in 3-6 hrs  if clinically indicated. >0.05 ng/mL: POTENTIAL  MYOCARDIAL INJURY. Repeat  testing in 3-6 hrs if  clinically indicated. NOTE: An increase or decrease  of 30% or more on serial  testing suggests a  clinically important change)  CK, Total 179 (Result(s) reported on 18 May 2013 at 10:31PM.)  CPK-MB, Serum  10.6 (Result(s) reported on 18 May 2013 at 07:36PM.)    22:24   Troponin I  2.50 (0.00-0.05 0.05 ng/mL or less: NEGATIVE  Repeat testing in 3-6 hrs  if clinically indicated. >0.05 ng/mL: POTENTIAL  MYOCARDIAL INJURY. Repeat  testing in 3-6 hrs if  clinically indicated. NOTE: An increase or decrease  of 30% or more on serial  testing suggests a  clinically important change)  03-Jan-15 02:13   Troponin I  2.00 (0.00-0.05 0.05 ng/mL or less: NEGATIVE  Repeat testing in 3-6 hrs  if clinically indicated. >0.05 ng/mL:  POTENTIAL  MYOCARDIAL INJURY. Repeat  testing in 3-6 hrs if  clinically indicated. NOTE: An increase or decrease  of 30% or more on serial  testing suggests a  clinically important change)  Routine Coag:  02-Jan-15 18:01   Activated PTT (APTT) 30.9 (A HCT value >55% may artifactually increase the APTT. In one study, the increase was an average of 19%. Reference: "Effect on Routine and Special Coagulation Testing Values of Citrate Anticoagulant Adjustment in Patients with High HCT Values." American Journal of Clinical Pathology 2006;126:400-405.)  Prothrombin 13.8  INR 1.1 (INR reference interval applies to patients on anticoagulant therapy. A single INR therapeutic range for coumarins is not optimal for all indications; however, the suggested range for most indications is 2.0 - 3.0. Exceptions to the INR Reference Range may include: Prosthetic heart valves, acute myocardial infarction, prevention of myocardial infarction, and combinations of aspirin and anticoagulant. The need for a higher  or lower target INR must be assessed individually. Reference: The Pharmacology and Management of the Vitamin K  antagonists: the seventh ACCP Conference on Antithrombotic and Thrombolytic Therapy. HQPRF.1638 Sept:126 (3suppl): [id number]. A HCT value >55% may artifactually increase the PT.  In one study,  the increase was an average of 25%. Reference:  "Effect on Routine and Special Coagulation Testing Values of Citrate Anticoagulant Adjustment in Patients with High HCT Values." American Journal of Clinical Pathology 2006;126:400-405.)  Routine Hem:  02-Jan-15 18:01   WBC (CBC) 7.9  RBC (CBC) 5.63  Hemoglobin (CBC) 15.3  Hematocrit (CBC) 46.0  Platelet Count (CBC) 181 (Result(s) reported on 18 May 2013 at 06:30PM.)  MCV 82  MCH 27.3  MCHC 33.3  RDW 14.3   EKG:  Interpretation EKG shows sinus tachycardia with frequent APCS, rate >100, RBBB Follow up EKG with NSR with RBBB, rate improved    Radiology Results: XRay:    02-Jan-15 19:25, Chest Portable Single View  Chest Portable Single View   REASON FOR EXAM:    chest pain  COMMENTS:       PROCEDURE: DXR - DXR PORTABLE CHEST SINGLE VIEW  - May 18 2013  7:25PM     CLINICAL DATA:  Chest pain.    EXAM:  PORTABLE CHEST - 1 VIEW    COMPARISON:  None.    FINDINGS:  The heart is upper limits of normal in size. The mediastinal and  hilar contours are within normal limits for age. There is mild  tortuosity of the thoracicaorta. Low lung volumes with vascular  crowding and bibasilar atelectasis. No definite infiltrates, edema  or effusions. The bony thorax is intact.     IMPRESSION:  Low lung volumes with vascular crowding and atelectasis.      Electronically Signed    By: Kalman Jewels M.D.    On: 05/18/2013 19:25         Verified By: Marlane Hatcher, M.D.,    ASA: Hives  Vital Signs/Nurse's Notes: **Vital Signs.:   03-Jan-15 05:47  Vital Signs Type Routine  Temperature Temperature (F) 97.6  Celsius 36.4  Pulse Pulse 77  Respirations Respirations 18  Systolic BP Systolic BP 466  Diastolic BP (mmHg) Diastolic BP (mmHg) 52  Mean BP 82  Pulse Ox % Pulse Ox % 96  Pulse Ox Activity Level  At rest  Oxygen Delivery Room Air/ 21 %    Impression [name] is a very pleasant 81 year old gentleman with obesity, diabetes, smoking history for 30 years, mild coronary artery disease by cardiac catheterization in 2004, aortic valve stenosis (estimated at moderate with peak gradient 55 mmHg by outside echocardiogram  11/2011), normal ejection fraction with normal wall motion, minimal carotid arterial disease who presents for chest pain, NSTEMI.   1) NSTEMI: chest pain worst on Thusday, two days ago,  now troponin trending down. Unstable angina, came on at rest. Tachycardia on arrival in setting of known aortic valve stenosis may also be contributing to sx. --Plan is for cardiac cath on Monday,  comfortable  now aspirin allergy, stay on plavix B-blocker, start statin (low dose as previous total cholesterol was 150s)  2) Aortic valve stenosis Moderate stenosis in 2013 echo pending  3)Diabetes: risk factor for CAD would check HbA1C continue outpt meds  4) HTN: Continue outpt meds, can hold ACE on monday   Electronic Signatures: Ida Rogue (MD)  (Signed 03-Jan-15 11:48)  Authored: General Aspect/Present Illness, History and Physical Exam, Review of System, Past Medical History,  Health Issues, Home Medications, Labs, EKG , Radiology, Allergies, Vital Signs/Nurse's Notes, Impression/Plan   Last Updated: 03-Jan-15 11:48 by Ida Rogue (MD)

## 2014-10-15 DIAGNOSIS — E780 Pure hypercholesterolemia: Secondary | ICD-10-CM | POA: Diagnosis not present

## 2014-10-15 DIAGNOSIS — R5383 Other fatigue: Secondary | ICD-10-CM | POA: Diagnosis not present

## 2014-10-15 DIAGNOSIS — E559 Vitamin D deficiency, unspecified: Secondary | ICD-10-CM | POA: Diagnosis not present

## 2014-10-15 DIAGNOSIS — R5381 Other malaise: Secondary | ICD-10-CM | POA: Diagnosis not present

## 2014-10-15 DIAGNOSIS — E119 Type 2 diabetes mellitus without complications: Secondary | ICD-10-CM | POA: Diagnosis not present

## 2014-10-15 DIAGNOSIS — N320 Bladder-neck obstruction: Secondary | ICD-10-CM | POA: Diagnosis not present

## 2014-10-17 ENCOUNTER — Ambulatory Visit: Admitting: Nurse Practitioner

## 2014-10-17 ENCOUNTER — Ambulatory Visit (INDEPENDENT_AMBULATORY_CARE_PROVIDER_SITE_OTHER): Payer: Medicare Other | Admitting: Cardiovascular Disease

## 2014-10-17 ENCOUNTER — Encounter: Payer: Self-pay | Admitting: Cardiovascular Disease

## 2014-10-17 ENCOUNTER — Encounter (INDEPENDENT_AMBULATORY_CARE_PROVIDER_SITE_OTHER): Payer: Self-pay

## 2014-10-17 VITALS — BP 140/60 | HR 50 | Ht 71.0 in | Wt 277.5 lb

## 2014-10-17 DIAGNOSIS — I10 Essential (primary) hypertension: Secondary | ICD-10-CM | POA: Diagnosis not present

## 2014-10-17 DIAGNOSIS — Z01810 Encounter for preprocedural cardiovascular examination: Secondary | ICD-10-CM | POA: Insufficient documentation

## 2014-10-17 DIAGNOSIS — R9431 Abnormal electrocardiogram [ECG] [EKG]: Secondary | ICD-10-CM | POA: Diagnosis not present

## 2014-10-17 DIAGNOSIS — I25119 Atherosclerotic heart disease of native coronary artery with unspecified angina pectoris: Secondary | ICD-10-CM

## 2014-10-17 DIAGNOSIS — I350 Nonrheumatic aortic (valve) stenosis: Secondary | ICD-10-CM

## 2014-10-17 DIAGNOSIS — E785 Hyperlipidemia, unspecified: Secondary | ICD-10-CM

## 2014-10-17 NOTE — Assessment & Plan Note (Signed)
He denies any anginal symptoms. No further testing at this time

## 2014-10-17 NOTE — Patient Instructions (Addendum)
You are doing well. No medication changes were made.  We will order an echocardiogram for severe aortic valve stenosis  Please call us if you have new issues that need to be addressed before your next appt.  Your physician wants you to follow-up in: 6 months.  You will receive a reminder letter in the mail two months in advance. If you don't receive a letter, please call our office to schedule the follow-up appointment.  Echocardiogram An echocardiogram, or echocardiography, uses sound waves (ultrasound) to produce an image of your heart. The echocardiogram is simple, painless, obtained within a short period of time, and offers valuable information to your health care provider. The images from an echocardiogram can provide information such as:  Evidence of coronary artery disease (CAD).  Heart size.  Heart muscle function.  Heart valve function.  Aneurysm detection.  Evidence of a past heart attack.  Fluid buildup around the heart.  Heart muscle thickening.  Assess heart valve function. LET [facility] Center[facility]OUR HEALTH CARE PROVIDER KNOW ABOUT:  Any allergies you have.  All medicines you are taking, including vitamins, herbs, eye drops, creams, and over-the-counter medicines.  Previous problems you or members of your family have had with the use of anesthetics.  Any blood disorders you have.  Previous surgeries you have had.  Medical conditions you have.  Possibility of pregnancy, if this applies. BEFORE THE PROCEDURE  No special preparation is needed. Eat and drink normally.  PROCEDURE   In order to produce an image of your heart, gel will be applied to your chest and a wand-like tool (transducer) will be moved over your chest. The gel will help transmit the sound waves from the transducer. The sound waves will harmlessly bounce off your heart to allow the heart images to be captured in real-time motion. These images will then be recorded.  You may need an IV to receive a medicine  that improves the quality of the pictures. AFTER THE PROCEDURE You may return to your normal schedule including diet, activities, and medicines, unless your health care provider tells you otherwise. Document Released: 04/30/2000 Document Revised: 09/17/2013 Document Reviewed: 01/08/2013 [facility] Patient Information 2015 [place], [place]. This information is not intended to replace advice given to you by your health care provider. Make sure you discuss any questions you have with your health care provider.

## 2014-10-17 NOTE — Assessment & Plan Note (Signed)
Blood pressure is well controlled on today's visit. No changes made to the medications. 

## 2014-10-17 NOTE — Assessment & Plan Note (Signed)
Cholesterol is at goal on the current lipid regimen. No changes to the medications were made.  

## 2014-10-17 NOTE — Progress Notes (Signed)
Patient ID: Merrick Klich, male    DOB: [birth year], 81 y.o.   MRN: [id number]  HPI Comments: Mr. Gadbois is a very pleasant 81 year old gentleman with obesity, diabetes, smoking history for 30 years, mild coronary artery disease by cardiac catheterization in 2004, aortic valve stenosis (estimated at moderate with peak gradient 55 mmHg by outside echocardiogram) moderate by TEE in January 2015, normal ejection fraction with normal wall motion, minimal carotid arterial disease who presents for routine follow-up   of his aortic valve disease  He reports that he is scheduled to have knee surgery in several weeks' time. He does not know the physician performing the surgery. He denies any new symptoms. He does have chronic mild shortness of breath No chest pain symptoms concerning for angina  wife  previouslyhad bilateral stroke. She is in a nursing home. Significant stress secondary to this.  He is not doing any regular exercise.weight continues to be a problem. Chronic lower extremity swelling, right greater than left. He does not use compression hose He feels that swelling has been stable  EKG on today's visit shows sinus bradycardia with rate 50 bpm, right bundle branch block  Other past medical history Cardiac catheterization July 2004 showed 20% disease in the LAD, RCA and left circumflex Stress test in 2012 shows no significant ischemia, attenuation artifact in the septal wall  Vitamin D 13, hematocrit 42, creatinine 0.77, total cholesterol 157, HDL 54, LDL 89   Allergies  Allergen Reactions  . Aspirin     Rash     Current Outpatient Prescriptions on File Prior to Visit  Medication Sig Dispense Refill  . atorvastatin (LIPITOR) 40 MG tablet Take 1 tablet (40 mg total) by mouth daily. 90 tablet 3  . clopidogrel (PLAVIX) 75 MG tablet Take 1 tablet (75 mg total) by mouth daily. 90 tablet 3  . furosemide (LASIX) 20 MG tablet Take 1 tablet (20 mg total) by mouth daily as needed. (Patient  taking differently: Take 20 mg by mouth daily. ) 90 tablet 3  . lisinopril (PRINIVIL,ZESTRIL) 10 MG tablet Take 1 tablet (10 mg total) by mouth daily. 90 tablet 3  . metFORMIN (GLUCOPHAGE) 500 MG tablet Take 1 tablet (500 mg total) by mouth 2 (two) times daily with a meal. 180 tablet 1  . metoprolol succinate (TOPROL XL) 25 MG 24 hr tablet Take 1 tablet (25 mg total) by mouth daily. 90 tablet 3   No current facility-administered medications on file prior to visit.    Past Medical History  Diagnosis Date  . Hiatal hernia   . Hypertension   . Coronary artery disease     a. Nonobstructive by 2004 cath b. Nonobstructive by 2015 cath  . Hyperlipidemia   . Asthma   . Osteoarthritis   . Obesity   . Onychomycosis   . Diabetes mellitus without complication     a. Hgb A1C 5.9% 05/2013  . Arrhythmia   . Aortic atherosclerosis 05/2013    Per TEE  . MI (myocardial infarction)   . GERD (gastroesophageal reflux disease)   . Heart murmur   . Colon polyp   . Aortic stenosis     Past Surgical History  Procedure Laterality Date  . Cardiac catheterization  11/2002  . Cardiac catheterization  05/21/2013    showing 30% oLM, 30% D1, 20% mLCx, 30% pRCA; EF 60%, severe AS (mean gradient 28 mmHg, peak 26, area 0.9)  . Transthoracic echocardiogram  05/19/2013    EF 60-65%, impaired diastolic function,  mild LA dilatation, mild MR/AI/TR, mod AS, high normal RVSP (30.4 mmHg)  . Transesophageal echocardiogram  05/22/2013    Preserved EF, moderate AS (valve area by planimetry between 1.0-1.26 cm sq), moderate aortic arch and descending aortic atherosclerosis.   . Cholecystectomy  2001    Social History  reports that he quit smoking about 33 years ago. His smoking use included Cigarettes. He has a 30 pack-year smoking history. He has never used smokeless tobacco. He reports that he does not drink alcohol or use illicit drugs.  Family History family history includes Heart disease in his mother; Heart disease  (age of onset: 1560) in his father; Stroke in his brother.   Review of Systems  Constitutional: Negative.   HENT: Congestion: Keithwe continue penicillin we'll aggressively to be a 50.   Respiratory: Negative.   Cardiovascular: Positive for leg swelling.  Gastrointestinal: Negative.   Musculoskeletal: Positive for gait problem.  Neurological: Negative.   Psychiatric/Behavioral: Negative.   All other systems reviewed and are negative.   BP 140/60 mmHg  Pulse 50  Ht 5\' 11"  (1.803 m)  Wt 277 lb 8 oz (125.873 kg)  BMI 38.72 kg/m2  Physical Exam  Constitutional: He is oriented to person, place, and time. He appears well-developed and well-nourished.  HENT:  Head: Normocephalic.  Nose: Nose normal.  Mouth/Throat: Oropharynx is clear and moist.  Eyes: Conjunctivae are normal. Pupils are equal, round, and reactive to light.  Neck: Normal range of motion. Neck supple. No JVD present.  Cardiovascular: Regular rhythm, S1 normal, S2 normal, normal heart sounds and intact distal pulses.  Bradycardia present.  Exam reveals no gallop and no friction rub.   No murmur heard. Nonpitting edema worse on the right than the left to below the knees  Pulmonary/Chest: Effort normal and breath sounds normal. No respiratory distress. He has no wheezes. He has no rales. He exhibits no tenderness.  Abdominal: Soft. Bowel sounds are normal. He exhibits no distension. There is no tenderness.  Musculoskeletal: Normal range of motion. He exhibits edema. He exhibits no tenderness.  Lymphadenopathy:    He has no cervical adenopathy.  Neurological: He is alert and oriented to person, place, and time. Coordination normal.  Skin: Skin is warm and dry. No rash noted. No erythema.  Psychiatric: He has a normal mood and affect. His behavior is normal. Judgment and thought content normal.      Assessment and Plan   Nursing note and vitals reviewed.

## 2014-10-17 NOTE — Assessment & Plan Note (Signed)
Echocardiogram has been ordered to evaluate his aortic valve Previously was moderate stenosis. He would be moderate risk of complication with total knee replacement.  Currently with no angina. No further testing needed

## 2014-10-17 NOTE — Assessment & Plan Note (Addendum)
We have recommended repeat echocardiogram to evaluate his aortic valve. Previously moderate aortic valve stenosis Last TEE was 1.5 years ago. He is relatively sedentary, no significant change in his shortness of breath symptoms.

## 2014-10-21 ENCOUNTER — Ambulatory Visit: Payer: Medicare Other | Admitting: Nurse Practitioner

## 2014-10-21 DIAGNOSIS — Z0289 Encounter for other administrative examinations: Secondary | ICD-10-CM

## 2014-10-22 ENCOUNTER — Ambulatory Visit (INDEPENDENT_AMBULATORY_CARE_PROVIDER_SITE_OTHER): Payer: Medicare Other

## 2014-10-22 ENCOUNTER — Other Ambulatory Visit: Payer: Self-pay

## 2014-10-22 DIAGNOSIS — R9431 Abnormal electrocardiogram [ECG] [EKG]: Secondary | ICD-10-CM

## 2014-10-22 DIAGNOSIS — I350 Nonrheumatic aortic (valve) stenosis: Secondary | ICD-10-CM

## 2014-10-22 DIAGNOSIS — I25119 Atherosclerotic heart disease of native coronary artery with unspecified angina pectoris: Secondary | ICD-10-CM

## 2014-10-31 DIAGNOSIS — M1712 Unilateral primary osteoarthritis, left knee: Secondary | ICD-10-CM | POA: Diagnosis not present

## 2015-01-08 ENCOUNTER — Encounter: Payer: Self-pay | Admitting: Emergency Medicine

## 2015-01-08 ENCOUNTER — Other Ambulatory Visit: Payer: Self-pay | Admitting: Nurse Practitioner

## 2015-01-08 ENCOUNTER — Other Ambulatory Visit: Payer: Self-pay

## 2015-01-08 ENCOUNTER — Emergency Department
Admission: EM | Admit: 2015-01-08 | Discharge: 2015-01-08 | Disposition: A | Discharge status: Home / Self Care | Payer: Medicare Other | Attending: Emergency Medicine | Admitting: Emergency Medicine

## 2015-01-08 DIAGNOSIS — E119 Type 2 diabetes mellitus without complications: Secondary | ICD-10-CM | POA: Diagnosis not present

## 2015-01-08 DIAGNOSIS — Z79899 Other long term (current) drug therapy: Secondary | ICD-10-CM | POA: Insufficient documentation

## 2015-01-08 DIAGNOSIS — Z7902 Long term (current) use of antithrombotics/antiplatelets: Secondary | ICD-10-CM | POA: Insufficient documentation

## 2015-01-08 DIAGNOSIS — Z87891 Personal history of nicotine dependence: Secondary | ICD-10-CM | POA: Diagnosis not present

## 2015-01-08 DIAGNOSIS — I951 Orthostatic hypotension: Secondary | ICD-10-CM | POA: Insufficient documentation

## 2015-01-08 DIAGNOSIS — R42 Dizziness and giddiness: Secondary | ICD-10-CM | POA: Diagnosis present

## 2015-01-08 DIAGNOSIS — I10 Essential (primary) hypertension: Secondary | ICD-10-CM | POA: Diagnosis not present

## 2015-01-08 LAB — URINALYSIS COMPLETE WITH MICROSCOPIC (ARMC ONLY)
Bilirubin Urine: NEGATIVE
Glucose, UA: NEGATIVE mg/dL
Ketones, ur: NEGATIVE mg/dL
Leukocytes, UA: NEGATIVE
NITRITE: NEGATIVE
PROTEIN: NEGATIVE mg/dL
SPECIFIC GRAVITY, URINE: 1.011 (ref 1.005–1.030)
pH: 6 (ref 5.0–8.0)

## 2015-01-08 NOTE — ED Provider Notes (Signed)
[facility] Emergency Department Provider Note  ____________________________________________  Time seen: 10:50 AM  I have reviewed the triage vital signs and the nursing notes.   HISTORY  Chief Complaint Dizziness    HPI [name] is a 81 y.o. male who complains of episodes of dizziness over the last 3 days. He describes the dizziness as lightheadedness and denies any sense of motion. It happens whenever he stands up particular getting up in the morning from bed. He reports that he is eating and drinking very well and taking all of his medications. Apart from the lightheadedness, no other symptoms. No headache numbness tingling or weakness vision changes. No fever chills chest pain shortness of breath cough runny nose abdominal pain nausea vomiting diarrhea. No falls or injuries.     Past Medical History  Diagnosis Date  . Hiatal hernia   . Hypertension   . Coronary artery disease     a. Nonobstructive by 2004 cath b. Nonobstructive by 2015 cath  . Hyperlipidemia   . Asthma   . Osteoarthritis   . Obesity   . Onychomycosis   . Diabetes mellitus without complication     a. Hgb A1C 5.9% 05/2013  . Arrhythmia   . Aortic atherosclerosis 05/2013    Per TEE  . MI (myocardial infarction)   . GERD (gastroesophageal reflux disease)   . Heart murmur   . Colon polyp   . Aortic stenosis     Patient Active Problem List   Diagnosis Date Noted  . Preop cardiovascular exam 10/17/2014  . Pain in knee 06/29/2013  . Encounter to establish care 06/29/2013  . CAD (coronary artery disease), native coronary artery 06/05/2013  . Hypertension 06/05/2013  . Abnormal EKG 02/23/2012  . Hyperlipidemia 02/23/2012  . Diabetes mellitus 02/23/2012  . Aortic valve stenosis 02/23/2012    Past Surgical History  Procedure Laterality Date  . Cardiac catheterization  11/2002  . Cardiac catheterization  05/21/2013    showing 30% oLM, 30% D1, 20% mLCx, 30% pRCA; EF 60%, severe  AS (mean gradient 28 mmHg, peak 26, area 0.9)  . Transthoracic echocardiogram  05/19/2013    EF 60-65%, impaired diastolic function, mild LA dilatation, mild MR/AI/TR, mod AS, high normal RVSP (30.4 mmHg)  . Transesophageal echocardiogram  05/22/2013    Preserved EF, moderate AS (valve area by planimetry between 1.0-1.26 cm sq), moderate aortic arch and descending aortic atherosclerosis.   . Cholecystectomy  2001    Current Outpatient Rx  Name  Route  Sig  Dispense  Refill  . atorvastatin (LIPITOR) 40 MG tablet   Oral   Take 1 tablet (40 mg total) by mouth daily.   90 tablet   3   . clopidogrel (PLAVIX) 75 MG tablet   Oral   Take 1 tablet (75 mg total) by mouth daily.   90 tablet   3   . furosemide (LASIX) 20 MG tablet   Oral   Take 1 tablet (20 mg total) by mouth daily as needed. Patient taking differently: Take 20 mg by mouth daily.    90 tablet   3   . lisinopril (PRINIVIL,ZESTRIL) 10 MG tablet   Oral   Take 1 tablet (10 mg total) by mouth daily.   90 tablet   3   . metFORMIN (GLUCOPHAGE) 500 MG tablet   Oral   Take 1 tablet (500 mg total) by mouth 2 (two) times daily with a meal.   180 tablet   1   .  metoprolol succinate (TOPROL XL) 25 MG 24 hr tablet   Oral   Take 1 tablet (25 mg total) by mouth daily.   90 tablet   3   . potassium chloride (K-DUR) 10 MEQ tablet   Oral   Take 10 mEq by mouth daily.           Allergies Aspirin  Family History  Problem Relation Age of Onset  . Heart disease Mother   . Heart disease Father 54  . Stroke Brother     Social History Social History  Substance Use Topics  . Smoking status: Former Smoker -- 1.50 packs/[place] for 20 years    Types: Cigarettes    Quit date: 02/22/1981  . Smokeless tobacco: Never Used  . Alcohol Use: No    Review of Systems  Constitutional: No fever or chills. No weight changes Eyes:No blurry vision or double vision.  ENT: No sore throat. Cardiovascular: No chest pain. Respiratory: No  dyspnea or cough. Gastrointestinal: Negative for abdominal pain, vomiting and diarrhea.  No BRBPR or melena. Genitourinary: Negative for dysuria, urinary retention, bloody urine, or difficulty urinating. Musculoskeletal: Negative for back pain. No joint swelling or pain. Skin: Negative for rash. Neurological: Negative for headaches, focal weakness or numbness. Lightheadedness as above Psychiatric:No anxiety or depression.   Endocrine:No hot/cold intolerance, changes in energy, or sleep difficulty.  10-point ROS otherwise negative.  ____________________________________________   PHYSICAL EXAM:  VITAL SIGNS: ED Triage Vitals  Enc Vitals Group     BP 01/08/15 1041 141/53 mmHg     Pulse Rate 01/08/15 1041 65     Resp 01/08/15 1041 18     Temp 01/08/15 1041 97.7 F (36.5 C)     Temp src --      SpO2 01/08/15 1041 97 %     Weight 01/08/15 1041 200 lb (90.719 kg)     Height 01/08/15 1041 5\' 11"  (1.803 m)     Head Cir --      Peak Flow --      Pain Score 01/08/15 1040 0     Pain Loc --      Pain Edu? --      Excl. in GC? --      Constitutional: Alert and oriented. Well appearing and in no distress. Eyes: No scleral icterus. No conjunctival pallor. PERRL. EOMI ENT   Head: Normocephalic and atraumatic.   Nose: No congestion/rhinnorhea. No septal hematoma   Mouth/Throat: MMM, no pharyngeal erythema. No peritonsillar mass. No uvula shift.   Neck: No stridor. No SubQ emphysema. No meningismus. Hematological/Lymphatic/Immunilogical: No cervical lymphadenopathy. Cardiovascular: RRR. Normal and symmetric distal pulses are present in all extremities. No murmurs, rubs, or gallops. Respiratory: Normal respiratory effort without tachypnea nor retractions. Breath sounds are clear and equal bilaterally. No wheezes/rales/rhonchi. Gastrointestinal: Soft and nontender. No distention. There is no CVA tenderness.  No rebound, rigidity, or guarding. Genitourinary:  deferred Musculoskeletal: Nontender with normal range of motion in all extremities. No joint effusions.  No lower extremity tenderness.  No edema. Neurologic:   Normal speech and language.  CN 2-10 normal. Motor grossly intact. No pronator drift.  Normal gait. No gross focal neurologic deficits are appreciated.  Skin:  Skin is warm, dry and intact. No rash noted.  No petechiae, purpura, or bullae. Psychiatric: Mood and affect are normal. Speech and behavior are normal. Patient exhibits appropriate insight and judgment.  ____________________________________________    LABS (pertinent positives/negatives) (all labs ordered are listed, but only abnormal results are  displayed) Labs Reviewed  URINALYSIS COMPLETEWITH MICROSCOPIC ([facility] ONLY) - Abnormal; Notable for the following:    Color, Urine YELLOW (*)    APPearance CLEAR (*)    Hgb urine dipstick 1+ (*)    Bacteria, UA RARE (*)    Squamous Epithelial / LPF 0-5 (*)    All other components within normal limits  CBG MONITORING, ED   fingerstick glucose 90 ____________________________________________   EKG  Interpreted by me Normal sinus rhythm rate of 61, normal axis and intervals. Right bundle branch block. Normal ST segments and T waves.  ____________________________________________    RADIOLOGY    ____________________________________________   PROCEDURES  ____________________________________________   INITIAL IMPRESSION / ASSESSMENT AND PLAN / ED COURSE  Pertinent labs & imaging results that were available during my care of the patient were reviewed by me and considered in my medical decision making (see chart for details).  Patient well appearing no acute distress. No symptoms at present time. Her fingerstick EKG and urinalysis all unremarkable. No further workup indicated at this time, we will discharge the patient home and have him follow up with primary care for continued monitoring of her symptoms. Encourage  the patient to drink extra fluids to maintain good hydration.  ____________________________________________   FINAL CLINICAL IMPRESSION(S) / ED DIAGNOSES  Final diagnoses:  Orthostatic lightheadedness      Sharman Cheek, MD 01/08/15 1247

## 2015-01-08 NOTE — Discharge Instructions (Signed)

## 2015-01-08 NOTE — ED Notes (Signed)
Pt presents with dizziness for three days. Pt is diabetic.

## 2015-01-08 NOTE — ED Notes (Signed)
BGL 90

## 2015-01-10 LAB — GLUCOSE, CAPILLARY: GLUCOSE-CAPILLARY: 85 mg/dL (ref 65–99)

## 2015-01-13 ENCOUNTER — Ambulatory Visit (INDEPENDENT_AMBULATORY_CARE_PROVIDER_SITE_OTHER): Payer: Medicare Other | Admitting: Nurse Practitioner

## 2015-01-13 VITALS — BP 122/58 | HR 61 | Temp 98.3°F | Resp 18 | Ht 71.0 in | Wt 285.8 lb

## 2015-01-13 DIAGNOSIS — E1159 Type 2 diabetes mellitus with other circulatory complications: Secondary | ICD-10-CM

## 2015-01-13 DIAGNOSIS — Z01818 Encounter for other preprocedural examination: Secondary | ICD-10-CM | POA: Diagnosis not present

## 2015-01-13 DIAGNOSIS — M25561 Pain in right knee: Secondary | ICD-10-CM

## 2015-01-13 DIAGNOSIS — I10 Essential (primary) hypertension: Secondary | ICD-10-CM

## 2015-01-13 DIAGNOSIS — I25119 Atherosclerotic heart disease of native coronary artery with unspecified angina pectoris: Secondary | ICD-10-CM | POA: Diagnosis not present

## 2015-01-13 DIAGNOSIS — M25562 Pain in left knee: Secondary | ICD-10-CM

## 2015-01-13 LAB — CBC WITH DIFFERENTIAL/PLATELET
BASOS PCT: 0.5 % (ref 0.0–3.0)
Basophils Absolute: 0 10*3/uL (ref 0.0–0.1)
EOS ABS: 0.3 10*3/uL (ref 0.0–0.7)
Eosinophils Relative: 4.4 % (ref 0.0–5.0)
HEMATOCRIT: 41.2 % (ref 39.0–52.0)
Hemoglobin: 13.5 g/dL (ref 13.0–17.0)
LYMPHS PCT: 27.4 % (ref 12.0–46.0)
Lymphs Abs: 1.8 10*3/uL (ref 0.7–4.0)
MCHC: 32.8 g/dL (ref 30.0–36.0)
MCV: 83 fl (ref 78.0–100.0)
Monocytes Absolute: 0.4 10*3/uL (ref 0.1–1.0)
Monocytes Relative: 6.7 % (ref 3.0–12.0)
NEUTROS ABS: 4 10*3/uL (ref 1.4–7.7)
Neutrophils Relative %: 61 % (ref 43.0–77.0)
PLATELETS: 161 10*3/uL (ref 150.0–400.0)
RBC: 4.97 Mil/uL (ref 4.22–5.81)
RDW: 14.8 % (ref 11.5–15.5)
WBC: 6.5 10*3/uL (ref 4.0–10.5)

## 2015-01-13 LAB — LIPID PANEL
CHOLESTEROL: 132 mg/dL (ref 0–200)
HDL: 44.3 mg/dL (ref >39.00)
LDL Cholesterol: 73 mg/dL (ref 0–99)
NonHDL: 87.68
TRIGLYCERIDES: 72 mg/dL (ref 0.0–149.0)
Total CHOL/HDL Ratio: 3
VLDL: 14.4 mg/dL (ref 0.0–40.0)

## 2015-01-13 LAB — COMPREHENSIVE METABOLIC PANEL
ALT: 12 U/L (ref 0–53)
AST: 13 U/L (ref 0–37)
Albumin: 3.5 g/dL (ref 3.5–5.2)
Alkaline Phosphatase: 64 U/L (ref 39–117)
BILIRUBIN TOTAL: 1.1 mg/dL (ref 0.2–1.2)
BUN: 14 mg/dL (ref 6–23)
CALCIUM: 9 mg/dL (ref 8.4–10.5)
CHLORIDE: 105 meq/L (ref 96–112)
CO2: 34 meq/L — AB (ref 19–32)
Creatinine, Ser: 0.77 mg/dL (ref 0.40–1.50)
GFR: 124.36 mL/min (ref >60.00)
Glucose, Bld: 93 mg/dL (ref 70–99)
POTASSIUM: 4 meq/L (ref 3.5–5.1)
Sodium: 144 mEq/L (ref 135–145)
Total Protein: 6.2 g/dL (ref 6.0–8.3)

## 2015-01-13 LAB — HEMOGLOBIN A1C: Hgb A1c MFr Bld: 5.9 % (ref 4.6–6.5)

## 2015-01-13 NOTE — Patient Instructions (Signed)
Work on small portions (fist sized or less), cut down on fried foods, rice, pasta, and potatoes. Eat small meals 5-6 times a [place] instead of 3 large meals.   Work on chair exercises- there are DVD's and classes.   Can of soup lift- 10 x each arm and work up to 3 reps  Stretching in the chair ect...  Please visit the lab before leaving today.   Bring the paperwork to be filled out.  Follow up in 4 weeks.

## 2015-01-13 NOTE — Progress Notes (Signed)
Patient ID: [name], male    DOB: 1931/02/13  Age: 81 y.o. MRN: [id number]  CC: Follow-up   HPI [facility] [name] presents for ER follow up for dizziness and other follow up. Former [facility] pt. [name] is accompanied by his brother today.  1) Dx from ER visit on 01/08/15 was orthostatic lightheadedness.   [name] was encouraged to maintain good hydration, which his brother reports [name] is not doing.  EKG NSR w/ RBBB  Left knee replacement- not scheduled yet, but needs clearance today  1 tablet after eating   Right leg swelling uses cane for walking  History [name] has a past medical history of Hiatal hernia; Hypertension; Coronary artery disease; Hyperlipidemia; Asthma; Osteoarthritis; Obesity; Onychomycosis; Diabetes mellitus without complication; Arrhythmia; Aortic atherosclerosis (05/2013); MI (myocardial infarction); GERD (gastroesophageal reflux disease); Heart murmur; Colon polyp; and Aortic stenosis.   [name] has past surgical history that includes Cardiac catheterization (11/2002); Cardiac catheterization (05/21/2013); transthoracic echocardiogram (05/19/2013); transesophageal echocardiogram (05/22/2013); and Cholecystectomy (2001).   His family history includes Heart disease in his mother; Heart disease (age of onset: 40) in his father; Stroke in his brother.[name] reports that [name] quit smoking about 33 years ago. His smoking use included Cigarettes. [name] has a 30 pack-year smoking history. [name] has never used smokeless tobacco. [name] reports that [name] does not drink alcohol or use illicit drugs.  Outpatient Prescriptions Prior to Visit  Medication Sig Dispense Refill  . atorvastatin (LIPITOR) 40 MG tablet Take 1 tablet (40 mg total) by mouth daily. 90 tablet 3  . clopidogrel (PLAVIX) 75 MG tablet Take 1 tablet (75 mg total) by mouth daily. 90 tablet 3  . furosemide (LASIX) 20 MG tablet Take 1 tablet (20 mg total) by mouth daily as needed. (Patient taking differently: Take 20 mg by mouth daily. ) 90 tablet 3  . lisinopril  (PRINIVIL,ZESTRIL) 10 MG tablet Take 1 tablet (10 mg total) by mouth daily. 90 tablet 3  . metFORMIN (GLUCOPHAGE) 500 MG tablet Take 1 tablet (500 mg total) by mouth 2 (two) times daily with a meal. 180 tablet 1  . metoprolol succinate (TOPROL XL) 25 MG 24 hr tablet Take 1 tablet (25 mg total) by mouth daily. 90 tablet 3  . potassium chloride (K-DUR) 10 MEQ tablet Take 10 mEq by mouth daily.     No facility-administered medications prior to visit.    ROS Review of Systems  Constitutional: Negative for fever, chills, diaphoresis and fatigue.  Eyes: Negative for visual disturbance.  Respiratory: Negative for chest tightness, shortness of breath and wheezing.   Cardiovascular: Positive for leg swelling. Negative for chest pain and palpitations.  Gastrointestinal: Negative for nausea, vomiting and diarrhea.  Endocrine: Negative for polydipsia, polyphagia and polyuria.  Musculoskeletal: Positive for joint swelling, arthralgias and gait problem.  Skin: Negative for rash.  Neurological: Negative for dizziness, weakness and numbness.  Psychiatric/Behavioral: The patient is not nervous/anxious.     Objective:  BP 122/58 mmHg  Pulse 61  Temp(Src) 98.3 F (36.8 C)  Resp 18  Ht  (1.803 m)  Wt 285 lb 12.8 oz (129.638 kg)  BMI 39.88 kg/m2  SpO2 95%  Physical Exam  Constitutional: [name] is oriented to person, place, and time. [name] appears well-developed and well-nourished. No distress.  Cardiovascular: Normal rate, regular rhythm and intact distal pulses.   Murmur heard. Systolic  Pulmonary/Chest: Effort normal and breath sounds normal.  Musculoskeletal: [name] exhibits edema and tenderness.  Left knee is enlarged due to OA, slight swelling and  tenderness around patella Walker for ambulation  Neurological: [name] is alert and oriented to person, place, and time.  Skin: Skin is warm and dry. No rash noted. [name] is not diaphoretic.  Psychiatric: [name] has a normal mood and affect. His behavior is  normal. Judgment and thought content normal.   Assessment & Plan:   [name] was seen today for follow-up.  Diagnoses and all orders for this visit:  Preoperative clearance -     Comprehensive metabolic panel -     Hemoglobin A1c -     Lipid panel -     CBC with Differential/Platelet  Type 2 diabetes mellitus with other circulatory complications -     Hemoglobin A1c  Essential hypertension -     Comprehensive metabolic panel  Arthralgia of both knees  I am having [name] maintain his metFORMIN, atorvastatin, clopidogrel, lisinopril, metoprolol succinate, furosemide, and potassium chloride.  No orders of the defined types were placed in this encounter.     Follow-up: Return in about 4 weeks (around 02/10/2015) for Follow up.

## 2015-01-13 NOTE — Progress Notes (Signed)
Pre visit review using our clinic review tool, if applicable. No additional management support is needed unless otherwise documented below in the visit note. 

## 2015-01-27 DIAGNOSIS — I2510 Atherosclerotic heart disease of native coronary artery without angina pectoris: Secondary | ICD-10-CM | POA: Diagnosis not present

## 2015-01-27 DIAGNOSIS — I359 Nonrheumatic aortic valve disorder, unspecified: Secondary | ICD-10-CM | POA: Diagnosis not present

## 2015-01-27 DIAGNOSIS — E119 Type 2 diabetes mellitus without complications: Secondary | ICD-10-CM | POA: Diagnosis not present

## 2015-01-27 DIAGNOSIS — I10 Essential (primary) hypertension: Secondary | ICD-10-CM | POA: Diagnosis not present

## 2015-01-29 ENCOUNTER — Encounter: Payer: Self-pay | Admitting: Nurse Practitioner

## 2015-01-29 DIAGNOSIS — Z01818 Encounter for other preprocedural examination: Secondary | ICD-10-CM | POA: Insufficient documentation

## 2015-01-29 NOTE — Assessment & Plan Note (Addendum)
Okay to be cleared for surgery of left knee. Brother will bring paper work to be filled out.  Cardiac clearance performed by Dr. Mariah Milling

## 2015-01-29 NOTE — Assessment & Plan Note (Signed)
Discussed small meals and watching starches in diet. Described chair exercises to pt and encouraged him to find DVD's or resources in the community for activity.

## 2015-01-29 NOTE — Assessment & Plan Note (Signed)
BP Readings from Last 3 Encounters:  01/13/15 122/58  01/08/15 138/60  10/17/14 140/60   Controlled. Continue on current regimen.

## 2015-01-29 NOTE — Assessment & Plan Note (Signed)
Surgery for left knee not currently scheduled. Left knee is swollen and painful. Obvious gait difficulties due to pain. Cleared for surgery. Pt's brother to bring paper to sign.

## 2015-01-30 DIAGNOSIS — I2510 Atherosclerotic heart disease of native coronary artery without angina pectoris: Secondary | ICD-10-CM | POA: Diagnosis not present

## 2015-01-30 DIAGNOSIS — E119 Type 2 diabetes mellitus without complications: Secondary | ICD-10-CM | POA: Diagnosis not present

## 2015-02-10 ENCOUNTER — Ambulatory Visit: Payer: Medicare Other | Admitting: Nurse Practitioner

## 2015-02-23 ENCOUNTER — Other Ambulatory Visit: Payer: Self-pay | Admitting: Cardiovascular Disease

## 2015-03-26 ENCOUNTER — Encounter: Payer: Self-pay | Admitting: Nurse Practitioner

## 2015-03-26 ENCOUNTER — Ambulatory Visit (INDEPENDENT_AMBULATORY_CARE_PROVIDER_SITE_OTHER): Payer: Medicare Other | Admitting: Nurse Practitioner

## 2015-03-26 ENCOUNTER — Ambulatory Visit
Admission: RE | Admit: 2015-03-26 | Discharge: 2015-03-26 | Disposition: A | Discharge status: Home / Self Care | Payer: Medicare Other | Source: Ambulatory Visit | Attending: Nurse Practitioner | Admitting: Nurse Practitioner

## 2015-03-26 ENCOUNTER — Other Ambulatory Visit: Payer: Self-pay | Admitting: Nurse Practitioner

## 2015-03-26 DIAGNOSIS — R600 Localized edema: Secondary | ICD-10-CM | POA: Insufficient documentation

## 2015-03-26 DIAGNOSIS — I25119 Atherosclerotic heart disease of native coronary artery with unspecified angina pectoris: Secondary | ICD-10-CM | POA: Diagnosis not present

## 2015-03-26 DIAGNOSIS — M7989 Other specified soft tissue disorders: Secondary | ICD-10-CM

## 2015-03-26 DIAGNOSIS — R609 Edema, unspecified: Secondary | ICD-10-CM | POA: Diagnosis not present

## 2015-03-26 DIAGNOSIS — H9193 Unspecified hearing loss, bilateral: Secondary | ICD-10-CM | POA: Diagnosis not present

## 2015-03-26 DIAGNOSIS — H9190 Unspecified hearing loss, unspecified ear: Secondary | ICD-10-CM | POA: Insufficient documentation

## 2015-03-26 LAB — BASIC METABOLIC PANEL
BUN: 17 mg/dL (ref 6–23)
CALCIUM: 9.6 mg/dL (ref 8.4–10.5)
CO2: 31 meq/L (ref 19–32)
CREATININE: 0.84 mg/dL (ref 0.40–1.50)
Chloride: 105 mEq/L (ref 96–112)
GFR: 112.43 mL/min (ref >60.00)
Glucose, Bld: 102 mg/dL — ABNORMAL HIGH (ref 70–99)
Potassium: 4.2 mEq/L (ref 3.5–5.1)
Sodium: 143 mEq/L (ref 135–145)

## 2015-03-26 LAB — BRAIN NATRIURETIC PEPTIDE: PRO B NATRI PEPTIDE: 42 pg/mL (ref 0.0–100.0)

## 2015-03-26 LAB — D-DIMER, QUANTITATIVE: D-Dimer, Quant: 0.89 ug/mL-FEU — ABNORMAL HIGH (ref 0.00–0.48)

## 2015-03-26 NOTE — Patient Instructions (Addendum)
Work on portion control- SMALL PORTIONS! Eat 4-5 times a [place] small plates Work on adding veggies and fruits to your diet  We will contact you with your referral for hearing evaluation and ultrasound of right leg  Peripheral Edema You have swelling in your legs (peripheral edema). This swelling is due to excess accumulation of salt and water in your body. Edema may be a sign of heart, kidney or liver disease, or a side effect of a medication. It may also be due to problems in the leg veins. Elevating your legs and using special support stockings may be very helpful, if the cause of the swelling is due to poor venous circulation. Avoid long periods of standing, whatever the cause. Treatment of edema depends on identifying the cause. Chips, pretzels, pickles and other salty foods should be avoided. Restricting salt in your diet is almost always needed. Water pills (diuretics) are often used to remove the excess salt and water from your body via urine. These medicines prevent the kidney from reabsorbing sodium. This increases urine flow. Diuretic treatment may also result in lowering of potassium levels in your body. Potassium supplements may be needed if you have to use diuretics daily. Daily weights can help you keep track of your progress in clearing your edema. You should call your caregiver for follow up care as recommended. SEEK IMMEDIATE MEDICAL CARE IF:   You have increased swelling, pain, redness, or heat in your legs.  You develop shortness of breath, especially when lying down.  You develop chest or abdominal pain, weakness, or fainting.  You have a fever.   This information is not intended to replace advice given to you by your health care provider. Make sure you discuss any questions you have with your health care provider.   Document Released: 06/10/2004 Document Revised: 07/26/2011 Document Reviewed: 11/13/2014 Elsevier Interactive Patient Education [employer]2016 Else[employer] Inc.

## 2015-03-26 NOTE — Progress Notes (Signed)
Patient ID: [name], male    DOB: [birth year]  Age: 81 y.o. MRN: [id number]  CC: Follow-up and Leg Swelling   HPI Braxton Cremeans presents for follow up of right leg swelling. He is accompanied by his younger brother today.   1) Right leg swelling is worse than left. He reports it sometimes matches his left leg and then swells worse with dependent positions. He denies pain or erythema. He is still using a cane.   Dr. Mariah Milling has addressed this, but it was stable in June. Denies compression hose use.   Weight- discussed weight at length, patient reports he would like to work on losing weight. I think there was an erroneous entry in August stating he was at 200 lbs.   2) HOH- pt reports 20 years in the [employer] did this to him. He has trouble hearing myself, but his brother he understands well. He is agreeable to getting aides for hearing.   History Abid has a past medical history of Hiatal hernia; Hypertension; Coronary artery disease; Hyperlipidemia; Asthma; Osteoarthritis; Obesity; Onychomycosis; Diabetes mellitus without complication; Arrhythmia; Aortic atherosclerosis (05/2013); MI (myocardial infarction); GERD (gastroesophageal reflux disease); Heart murmur; Colon polyp; and Aortic stenosis.   He has past surgical history that includes Cardiac catheterization (11/2002); Cardiac catheterization (05/21/2013); transthoracic echocardiogram (05/19/2013); transesophageal echocardiogram (05/22/2013); and Cholecystectomy (2001).   His family history includes Heart disease in his mother; Heart disease (age of onset: 29) in his father; Stroke in his brother.He reports that he quit smoking about 34 years ago. His smoking use included Cigarettes. He has a 30 pack-year smoking history. He has never used smokeless tobacco. He reports that he does not drink alcohol or use illicit drugs.  Outpatient Prescriptions Prior to Visit  Medication Sig Dispense Refill  . atorvastatin (LIPITOR) 40 MG tablet TAKE 1 TABLET DAILY  90 tablet 2  . clopidogrel (PLAVIX) 75 MG tablet TAKE 1 TABLET DAILY 90 tablet 2  . furosemide (LASIX) 20 MG tablet TAKE 1 TABLET DAILY 90 tablet 2  . KLOR-CON M10 10 MEQ tablet TAKE 1 TABLET DAILY 90 tablet 2  . lisinopril (PRINIVIL,ZESTRIL) 10 MG tablet TAKE 1 TABLET DAILY 90 tablet 2  . metFORMIN (GLUCOPHAGE) 500 MG tablet Take 1 tablet (500 mg total) by mouth 2 (two) times daily with a meal. 180 tablet 1  . potassium chloride (K-DUR) 10 MEQ tablet Take 10 mEq by mouth daily.    . TOPROL XL 25 MG 24 hr tablet TAKE 1 TABLET DAILY 90 tablet 2   No facility-administered medications prior to visit.    ROS Review of Systems  Constitutional: Negative for fever, chills, diaphoresis and fatigue.  Respiratory: Negative for chest tightness, shortness of breath and wheezing.   Cardiovascular: Positive for leg swelling. Negative for chest pain and palpitations.  Endocrine: Negative for polydipsia, polyphagia and polyuria.  Skin: Negative for color change, pallor, rash and wound.  Neurological: Negative for numbness.   Objective:  BP 140/62 mmHg  Pulse 57  Temp(Src) 98 F (36.7 C) (Oral)  Resp 16  Ht  (1.803 m)  Wt 287 lb (130.182 kg)  BMI 40.05 kg/m2  SpO2 96%  Physical Exam  Constitutional: He is oriented to person, place, and time. He appears well-developed and well-nourished. No distress.  HENT:  Head: Normocephalic and atraumatic.  Right Ear: External ear normal.  Left Ear: External ear normal.  Cardiovascular: Normal rate and regular rhythm.  Exam reveals no gallop and no friction rub.   Murmur heard.  Pulmonary/Chest: Effort normal and breath sounds normal. No respiratory distress. He has no wheezes. He has no rales. He exhibits no tenderness.  Musculoskeletal: He exhibits edema. He exhibits no tenderness.  2+ pitting edema bilaterally, R>L edema, hairless extremities.   Neurological: He is alert and oriented to person, place, and time. Coordination abnormal.  Left knee  pain, using cane  Skin: Skin is warm and dry. No rash noted. He is not diaphoretic.  Psychiatric: He has a normal mood and affect. His behavior is normal. Judgment and thought content normal.   Assessment & Plan:   Archie PattenMonroe was seen today for follow-up and leg swelling.  Diagnoses and all orders for this visit:  Morbid obesity due to excess calories (HCC)  Hard of hearing, bilateral -     Ambulatory referral to Audiology  Leg swelling -     Basic Metabolic Panel (BMET) -     D-Dimer, Quantitative -     B Nat Peptide -     US Venous Img Lower Unilateral Right; Future   I am having Mr. Allums maintain his metFORMIN, potassium chloride, TOPROL XL, lisinopril, furosemide, clopidogrel, atorvastatin, and KLOR-CON M10.  No orders of the defined types were placed in this encounter.     Follow-up: Return in about 4 weeks (around 04/23/2015) for Follow up.

## 2015-03-26 NOTE — Assessment & Plan Note (Addendum)
Very large abdomen. BMI 40. Pt given handout with low carb easy options. He was told to watch portions and eat frequent small meals/snacks. Not a big sweet eater. Brother states he eats out often because he is with family or visiting his wife in the nursing home. FU in 4 weeks

## 2015-03-26 NOTE — Assessment & Plan Note (Signed)
PVD suspected. Lower extremities are hairless and edematous (pitting). Will get US of right leg to r/out clot. D-dimer ordered. Checking BNP, BMET too   FU in 4 weeks

## 2015-03-26 NOTE — Assessment & Plan Note (Signed)
Eval for hearing aides referral placed today. Pt has younger brother who can take him to visits. He is agreeable to this.

## 2015-03-28 ENCOUNTER — Ambulatory Visit: Payer: Medicare Other

## 2015-05-08 ENCOUNTER — Ambulatory Visit
Admission: RE | Admit: 2015-05-08 | Discharge: 2015-05-08 | Disposition: A | Discharge status: Home / Self Care | Payer: Medicare Other | Source: Ambulatory Visit | Attending: Internal Medicine | Admitting: Internal Medicine

## 2015-05-08 ENCOUNTER — Other Ambulatory Visit: Payer: Self-pay | Admitting: Internal Medicine

## 2015-05-08 DIAGNOSIS — I25119 Atherosclerotic heart disease of native coronary artery with unspecified angina pectoris: Secondary | ICD-10-CM | POA: Diagnosis not present

## 2015-05-08 DIAGNOSIS — E669 Obesity, unspecified: Secondary | ICD-10-CM | POA: Diagnosis not present

## 2015-05-08 DIAGNOSIS — R600 Localized edema: Secondary | ICD-10-CM | POA: Diagnosis not present

## 2015-05-08 DIAGNOSIS — E119 Type 2 diabetes mellitus without complications: Secondary | ICD-10-CM | POA: Diagnosis not present

## 2015-05-08 DIAGNOSIS — E785 Hyperlipidemia, unspecified: Secondary | ICD-10-CM | POA: Diagnosis not present

## 2015-05-08 DIAGNOSIS — I10 Essential (primary) hypertension: Secondary | ICD-10-CM | POA: Diagnosis not present

## 2015-05-08 DIAGNOSIS — E1169 Type 2 diabetes mellitus with other specified complication: Secondary | ICD-10-CM | POA: Diagnosis not present

## 2015-05-14 ENCOUNTER — Encounter: Payer: Self-pay | Admitting: *Deleted

## 2015-06-05 ENCOUNTER — Encounter: Payer: Self-pay | Admitting: Cardiovascular Disease

## 2015-06-05 ENCOUNTER — Ambulatory Visit (INDEPENDENT_AMBULATORY_CARE_PROVIDER_SITE_OTHER): Payer: Medicare Other | Admitting: Cardiovascular Disease

## 2015-06-05 VITALS — BP 128/62 | HR 72 | Ht 71.0 in | Wt 281.0 lb

## 2015-06-05 DIAGNOSIS — I10 Essential (primary) hypertension: Secondary | ICD-10-CM

## 2015-06-05 DIAGNOSIS — M7989 Other specified soft tissue disorders: Secondary | ICD-10-CM

## 2015-06-05 DIAGNOSIS — I25119 Atherosclerotic heart disease of native coronary artery with unspecified angina pectoris: Secondary | ICD-10-CM

## 2015-06-05 DIAGNOSIS — E1159 Type 2 diabetes mellitus with other circulatory complications: Secondary | ICD-10-CM

## 2015-06-05 DIAGNOSIS — E785 Hyperlipidemia, unspecified: Secondary | ICD-10-CM

## 2015-06-05 DIAGNOSIS — I350 Nonrheumatic aortic (valve) stenosis: Secondary | ICD-10-CM | POA: Diagnosis not present

## 2015-06-05 NOTE — Patient Instructions (Signed)
You are doing well. No medication changes were made.  Please call us if you have new issues that need to be addressed before your next appt.  Your physician wants you to follow-up in: 12 months.  You will receive a reminder letter in the mail two months in advance. If you don't receive a letter, please call our office to schedule the follow-up appointment. 

## 2015-06-05 NOTE — Assessment & Plan Note (Signed)
Blood pressure is well controlled on today's visit. No changes made to the medications. 

## 2015-06-05 NOTE — Assessment & Plan Note (Signed)
Currently with no symptoms of angina. No further workup at this time. Continue current medication regimen. 

## 2015-06-05 NOTE — Progress Notes (Signed)
Patient ID: [name], male    DOB: [birth year], 82 y.o.   MRN: [id number]  HPI Comments: [name] is a very pleasant 82 year old gentleman with obesity, diabetes, smoking history for 30 years, mild coronary artery disease by cardiac catheterization in 2004, aortic valve stenosis (estimated at moderate with peak gradient 55 mmHg by outside echocardiogram) moderate by TEE in January 2015, normal ejection fraction with normal wall motion, minimal carotid arterial disease who presents for routine follow-up   of his aortic valve disease  Echocardiogram in July 2016 again confirming moderate aortic valve stenosis, results reviewed with him on today's visit In follow-up, he reports having severe right knee pain. He did not go ahead with the replacement surgery He is concerned about leaving his wife who is in a nursing home, had a bilateral stroke. Walks with a cane, no regular exercise Has chronic right lower extremity swelling, previous trauma to the right leg. Does not wear a compression hose Denies any significant chest pain on exertion, he does have chronic mild shortness of breath. Family presents with him today, reports it is from his weight and deconditioning He reports that he stop smoking 30 years ago  Lower extremity venous Doppler reviewed with him from 05/08/2015 showing no DVT on the right  Lab work reviewed with him showing total cholesterol 132, LDL 73, hemoglobin A1c 5.9  EKG on today's visit shows sinus bradycardia with rate 72 bpm, right bundle branch block  Other past medical history Cardiac catheterization July 2004 showed 20% disease in the LAD, RCA and left circumflex Stress test in 2012 shows no significant ischemia, attenuation artifact in the septal wall  Vitamin D 13, hematocrit 42, creatinine 0.77, total cholesterol 157, HDL 54, LDL 89   Allergies  Allergen Reactions  . Aspirin Rash and Other (See Comments)    Reaction: Trouble breathing     Current Outpatient  Prescriptions on File Prior to Visit  Medication Sig Dispense Refill  . atorvastatin (LIPITOR) 40 MG tablet TAKE 1 TABLET DAILY 90 tablet 2  . clopidogrel (PLAVIX) 75 MG tablet TAKE 1 TABLET DAILY 90 tablet 2  . furosemide (LASIX) 20 MG tablet TAKE 1 TABLET DAILY 90 tablet 2  . KLOR-CON M10 10 MEQ tablet TAKE 1 TABLET DAILY 90 tablet 2  . lisinopril (PRINIVIL,ZESTRIL) 10 MG tablet TAKE 1 TABLET DAILY 90 tablet 2  . TOPROL XL 25 MG 24 hr tablet TAKE 1 TABLET DAILY 90 tablet 2   No current facility-administered medications on file prior to visit.    Past Medical History  Diagnosis Date  . Hiatal hernia   . Hypertension   . Coronary artery disease     a. Nonobstructive by 2004 cath b. Nonobstructive by 2015 cath  . Hyperlipidemia   . Asthma   . Osteoarthritis   . Obesity   . Onychomycosis   . Diabetes mellitus without complication (HCC)     a. Hgb A1C 5.9% 05/2013  . Arrhythmia   . Aortic atherosclerosis (HCC) 05/2013    Per TEE  . MI (myocardial infarction) (HCC)   . GERD (gastroesophageal reflux disease)   . Heart murmur   . Colon polyp   . Aortic stenosis     Past Surgical History  Procedure Laterality Date  . Cardiac catheterization  11/2002  . Cardiac catheterization  05/21/2013    showing 30% oLM, 30% D1, 20% mLCx, 30% pRCA; EF 60%, severe AS (mean gradient 28 mmHg, peak 26, area 0.9)  . Transthoracic echocardiogram  05/19/2013    EF 60-65%, impaired diastolic function, mild LA dilatation, mild MR/AI/TR, mod AS, high normal RVSP (30.4 mmHg)  . Transesophageal echocardiogram  05/22/2013    Preserved EF, moderate AS (valve area by planimetry between 1.0-1.26 cm sq), moderate aortic arch and descending aortic atherosclerosis.   . Cholecystectomy  2001    Social History  reports that he quit smoking about 80 years ago. His smoking use included Cigarettes. He has a 30 pack-year smoking history. He has never used smokeless tobacco. He reports that he does not drink alcohol or use  illicit drugs.  Family History family history includes Heart disease in his mother; Heart disease (age of onset: 72) in his father; Stroke in his brother.   Review of Systems  Constitutional: Negative.   HENT: Congestion: Keithwe continue penicillin we'll aggressively to be a 52.   Respiratory: Negative.   Cardiovascular: Positive for leg swelling.       Right lower extremity  Gastrointestinal: Negative.   Musculoskeletal: Positive for gait problem.       Walks with a cane  Neurological: Negative.   Psychiatric/Behavioral: Negative.   All other systems reviewed and are negative.   BP 128/62 mmHg  Pulse 72  Ht  (1.803 m)  Wt 281 lb (127.461 kg)  BMI 39.21 kg/m2  Physical Exam  Constitutional: He is oriented to person, place, and time. He appears well-developed and well-nourished.  Morbidly obese  HENT:  Head: Normocephalic.  Nose: Nose normal.  Mouth/Throat: Oropharynx is clear and moist.  Eyes: Conjunctivae are normal. Pupils are equal, round, and reactive to light.  Neck: Normal range of motion. Neck supple. No JVD present.  Cardiovascular: Regular rhythm, S1 normal, S2 normal and intact distal pulses.  Bradycardia present.  Exam reveals no gallop and no friction rub.   Murmur heard.  Systolic murmur is present with a grade of 3/6  Nonpitting edema of the right lower extremity below the knee  Pulmonary/Chest: Effort normal and breath sounds normal. No respiratory distress. He has no wheezes. He has no rales. He exhibits no tenderness.  Abdominal: Soft. Bowel sounds are normal. He exhibits no distension. There is no tenderness.  Musculoskeletal: Normal range of motion. He exhibits edema. He exhibits no tenderness.  Lymphadenopathy:    He has no cervical adenopathy.  Neurological: He is alert and oriented to person, place, and time. Coordination normal.  Skin: Skin is warm and dry. No rash noted. No erythema.  Psychiatric: He has a normal mood and affect. His  behavior is normal. Judgment and thought content normal.      Assessment and Plan   Nursing note and vitals reviewed.

## 2015-06-05 NOTE — Assessment & Plan Note (Signed)
Moderate aortic valve stenosis, repeat echocardiogram in one year May be contributing to mild shortness of breath on exertion

## 2015-06-05 NOTE — Assessment & Plan Note (Signed)
Swelling of the right lower extremity likely venous insufficiency, unable to rule out component of lymphedema. Recommended compression hose on the right

## 2015-06-05 NOTE — Assessment & Plan Note (Signed)
Unable to exercise, has difficulty losing weight Recommended a strict diet

## 2015-06-05 NOTE — Assessment & Plan Note (Signed)
Cholesterol is at goal on the current lipid regimen. No changes to the medications were made.  

## 2015-06-05 NOTE — Assessment & Plan Note (Signed)
Diabetes numbers relatively well-controlled He is unable to exercise. Recommended a strict diet, low carbohydrates

## 2015-06-21 ENCOUNTER — Encounter: Payer: Self-pay | Admitting: Emergency Medicine

## 2015-06-21 ENCOUNTER — Emergency Department: Payer: Medicare Other

## 2015-06-21 ENCOUNTER — Observation Stay
Admission: EM | Admit: 2015-06-21 | Discharge: 2015-06-21 | Disposition: A | Discharge status: Home / Self Care | Payer: Medicare Other | Attending: Internal Medicine | Admitting: Internal Medicine

## 2015-06-21 DIAGNOSIS — Z6836 Body mass index (BMI) 36.0-36.9, adult: Secondary | ICD-10-CM | POA: Diagnosis not present

## 2015-06-21 DIAGNOSIS — R079 Chest pain, unspecified: Secondary | ICD-10-CM | POA: Diagnosis present

## 2015-06-21 DIAGNOSIS — I350 Nonrheumatic aortic (valve) stenosis: Secondary | ICD-10-CM

## 2015-06-21 DIAGNOSIS — Z7902 Long term (current) use of antithrombotics/antiplatelets: Secondary | ICD-10-CM | POA: Insufficient documentation

## 2015-06-21 DIAGNOSIS — E1159 Type 2 diabetes mellitus with other circulatory complications: Secondary | ICD-10-CM | POA: Diagnosis not present

## 2015-06-21 DIAGNOSIS — Z823 Family history of stroke: Secondary | ICD-10-CM | POA: Insufficient documentation

## 2015-06-21 DIAGNOSIS — I2510 Atherosclerotic heart disease of native coronary artery without angina pectoris: Secondary | ICD-10-CM | POA: Insufficient documentation

## 2015-06-21 DIAGNOSIS — K449 Diaphragmatic hernia without obstruction or gangrene: Secondary | ICD-10-CM | POA: Insufficient documentation

## 2015-06-21 DIAGNOSIS — Z9889 Other specified postprocedural states: Secondary | ICD-10-CM | POA: Diagnosis not present

## 2015-06-21 DIAGNOSIS — E119 Type 2 diabetes mellitus without complications: Secondary | ICD-10-CM | POA: Diagnosis not present

## 2015-06-21 DIAGNOSIS — I25119 Atherosclerotic heart disease of native coronary artery with unspecified angina pectoris: Secondary | ICD-10-CM | POA: Diagnosis not present

## 2015-06-21 DIAGNOSIS — E669 Obesity, unspecified: Secondary | ICD-10-CM | POA: Insufficient documentation

## 2015-06-21 DIAGNOSIS — I252 Old myocardial infarction: Secondary | ICD-10-CM | POA: Diagnosis not present

## 2015-06-21 DIAGNOSIS — Z79899 Other long term (current) drug therapy: Secondary | ICD-10-CM | POA: Diagnosis not present

## 2015-06-21 DIAGNOSIS — Z886 Allergy status to analgesic agent status: Secondary | ICD-10-CM | POA: Insufficient documentation

## 2015-06-21 DIAGNOSIS — M1991 Primary osteoarthritis, unspecified site: Secondary | ICD-10-CM | POA: Diagnosis not present

## 2015-06-21 DIAGNOSIS — J45909 Unspecified asthma, uncomplicated: Secondary | ICD-10-CM | POA: Insufficient documentation

## 2015-06-21 DIAGNOSIS — K219 Gastro-esophageal reflux disease without esophagitis: Secondary | ICD-10-CM | POA: Insufficient documentation

## 2015-06-21 DIAGNOSIS — Z87891 Personal history of nicotine dependence: Secondary | ICD-10-CM | POA: Insufficient documentation

## 2015-06-21 DIAGNOSIS — Z86010 Personal history of colonic polyps: Secondary | ICD-10-CM | POA: Diagnosis not present

## 2015-06-21 DIAGNOSIS — R0789 Other chest pain: Principal | ICD-10-CM | POA: Insufficient documentation

## 2015-06-21 DIAGNOSIS — I10 Essential (primary) hypertension: Secondary | ICD-10-CM | POA: Diagnosis not present

## 2015-06-21 DIAGNOSIS — Z8249 Family history of ischemic heart disease and other diseases of the circulatory system: Secondary | ICD-10-CM | POA: Diagnosis not present

## 2015-06-21 DIAGNOSIS — E785 Hyperlipidemia, unspecified: Secondary | ICD-10-CM | POA: Insufficient documentation

## 2015-06-21 DIAGNOSIS — Z9049 Acquired absence of other specified parts of digestive tract: Secondary | ICD-10-CM | POA: Insufficient documentation

## 2015-06-21 DIAGNOSIS — E782 Mixed hyperlipidemia: Secondary | ICD-10-CM | POA: Diagnosis not present

## 2015-06-21 LAB — BASIC METABOLIC PANEL
ANION GAP: 5 (ref 5–15)
BUN: 11 mg/dL (ref 6–20)
CHLORIDE: 103 mmol/L (ref 101–111)
CO2: 31 mmol/L (ref 22–32)
Calcium: 8.8 mg/dL — ABNORMAL LOW (ref 8.9–10.3)
Creatinine, Ser: 0.76 mg/dL (ref 0.61–1.24)
GFR calc Af Amer: >60 mL/min (ref >60)
GLUCOSE: 128 mg/dL — AB (ref 65–99)
POTASSIUM: 4.1 mmol/L (ref 3.5–5.1)
Sodium: 139 mmol/L (ref 135–145)

## 2015-06-21 LAB — TROPONIN I
Troponin I: <0.03 ng/mL (ref <0.031)
Troponin I: <0.03 ng/mL (ref <0.031)
Troponin I: <0.03 ng/mL (ref <0.031)

## 2015-06-21 LAB — CBC
HEMATOCRIT: 42.3 % (ref 40.0–52.0)
HEMOGLOBIN: 14 g/dL (ref 13.0–18.0)
MCH: 27.3 pg (ref 26.0–34.0)
MCHC: 33 g/dL (ref 32.0–36.0)
MCV: 82.5 fL (ref 80.0–100.0)
Platelets: 131 10*3/uL — ABNORMAL LOW (ref 150–440)
RBC: 5.13 MIL/uL (ref 4.40–5.90)
RDW: 14.3 % (ref 11.5–14.5)
WBC: 9.6 10*3/uL (ref 3.8–10.6)

## 2015-06-21 MED ORDER — LISINOPRIL 10 MG PO TABS
10.0000 mg | ORAL_TABLET | Freq: Every day | ORAL | Status: DC
  Administered 2015-06-21: 10 mg via ORAL
  Filled 2015-06-21: qty 1

## 2015-06-21 MED ORDER — FUROSEMIDE 40 MG PO TABS
20.0000 mg | ORAL_TABLET | Freq: Every day | ORAL | Status: DC
  Administered 2015-06-21: 20 mg via ORAL
  Filled 2015-06-21: qty 1

## 2015-06-21 MED ORDER — NITROGLYCERIN 2 % TD OINT
0.5000 [in_us] | TOPICAL_OINTMENT | Freq: Once | TRANSDERMAL | Status: AC
  Administered 2015-06-21: 0.5 [in_us] via TOPICAL
  Filled 2015-06-21: qty 1

## 2015-06-21 MED ORDER — ATORVASTATIN CALCIUM 20 MG PO TABS
40.0000 mg | ORAL_TABLET | Freq: Every day | ORAL | Status: DC
  Administered 2015-06-21: 40 mg via ORAL
  Filled 2015-06-21: qty 2

## 2015-06-21 MED ORDER — ENOXAPARIN SODIUM 100 MG/ML ~~LOC~~ SOLN
SUBCUTANEOUS | Status: AC
  Administered 2015-06-21: 40 mg via SUBCUTANEOUS
  Filled 2015-06-21: qty 1

## 2015-06-21 MED ORDER — METOPROLOL SUCCINATE ER 50 MG PO TB24: 25.0000 mg | ORAL_TABLET | Freq: Every day | ORAL | Status: DC

## 2015-06-21 MED ORDER — NITROGLYCERIN 2 % TD OINT: 1.0000 [in_us] | TOPICAL_OINTMENT | Freq: Four times a day (QID) | TRANSDERMAL | Status: DC

## 2015-06-21 MED ORDER — CLOPIDOGREL BISULFATE 75 MG PO TABS
75.0000 mg | ORAL_TABLET | Freq: Every day | ORAL | Status: DC
  Administered 2015-06-21: 75 mg via ORAL
  Filled 2015-06-21: qty 1

## 2015-06-21 MED ORDER — POTASSIUM CHLORIDE CRYS ER 20 MEQ PO TBCR
10.0000 meq | EXTENDED_RELEASE_TABLET | Freq: Every day | ORAL | Status: DC
  Administered 2015-06-21: 10 meq via ORAL
  Filled 2015-06-21: qty 2

## 2015-06-21 MED ORDER — ENOXAPARIN SODIUM 40 MG/0.4ML ~~LOC~~ SOLN
40.0000 mg | SUBCUTANEOUS | Status: DC
  Administered 2015-06-21: 40 mg via SUBCUTANEOUS
  Filled 2015-06-21: qty 0.4

## 2015-06-21 NOTE — ED Notes (Signed)
Paged Dr. Juliene Pina to get diet orders for patient since he would not be getting to have his stress test done today. Per Dr. Juliene Pina, she will consult with cardiology to see about discharging patient if his third troponin is negative and he can have stress test done as outpatient.

## 2015-06-21 NOTE — ED Notes (Signed)
Rayna Sexton Mackiewicz - brother - phone [phone], cell - [phone]   Please call when bed assigned

## 2015-06-21 NOTE — ED Notes (Signed)
Pt to rm 2 via EMS from home, report awoken from sleep w/ substernal chest pain.  Denies accompanying sx.  Pt report hx of DM.  Pt reports taking 6 medications but unsure of names.  PT NAD upon arrival, respirations equal and unlabored, skin warm and dry.

## 2015-06-21 NOTE — ED Notes (Signed)
Dr. Juliene Pina called back and stated that if patients third troponin was negative he could be discharged to home and that she had placed his D/C orders.

## 2015-06-21 NOTE — ED Notes (Signed)
Dr. Elberta Fortis currently at bedside. Per Dr. Elberta Fortis hold Metoprolol for stress test, ok to administer Lisinopril.

## 2015-06-21 NOTE — ED Notes (Signed)
Nitro paste wiped off of patients chest prior to d/c

## 2015-06-21 NOTE — ED Notes (Signed)
Spoke with Dr. Juliene Pina to confirm whether patient is to be NPO or NPO except meds. Per Dr. Juliene Pina, patient can take medications.

## 2015-06-21 NOTE — ED Notes (Signed)
Per Dr. Juliene Pina, patient is to be NPO for [facility] stress test

## 2015-06-21 NOTE — ED Notes (Signed)
Spoke with [facility] to see if patient needed to hold any medications. Per NM, they will have to consult with cardiology to see if patient can have stress test done today, patient is supposed to have 3 negative troponin's that are 4 hours apart. If patient is not able to have stress test today, it will be Monday before it can be done, as they do not do stress test on Sundays. NM tech is going to page on call cardiologist and call us back to let us know.

## 2015-06-21 NOTE — H&P (Signed)
[facility] - [place] at [facility]   PATIENT NAME: [name]    MR#:  [id number]  DATE OF BIRTH:  [birth year]  DATE OF ADMISSION:  06/21/2015  PRIMARY CARE PHYSICIAN: Carollee Leitz, NP   REQUESTING/REFERRING PHYSICIAN:   CHIEF COMPLAINT:   Chief Complaint  Patient presents with  . Chest Pain    HISTORY OF PRESENT ILLNESS: [name]  is a 82 y.o. male with a known history of hypertension, coronary artery disease, hyperlipidemia, diabetes metastatic type II presented to the emergency room with chest pain. Patient woke up this morning around 3 AM with substernal chest pain. Pain was aching in nature 3 out of 10 on a scale of 1-10 located sub sternally. Workup in the emergency room first set of troponin was negative and EKG did not show any ST segment abnormality. No complaints of any nausea or vomiting and diaphoresis. No history of any syncope. It is chest pain-free in the emergency room. Had cardiac catheter in the past and is nonobstructive. Patient says his chest pain got aggravated when he takes a deep breath. No history of cough, fever and chills.  PAST MEDICAL HISTORY:   Past Medical History  Diagnosis Date  . Hiatal hernia   . Hypertension   . Coronary artery disease     a. Nonobstructive by 2004 cath b. Nonobstructive by 2015 cath  . Hyperlipidemia   . Asthma   . Osteoarthritis   . Obesity   . Onychomycosis   . Diabetes mellitus without complication (HCC)     a. Hgb A1C 5.9% 05/2013  . Arrhythmia   . Aortic atherosclerosis (HCC) 05/2013    Per TEE  . MI (myocardial infarction) (HCC)   . GERD (gastroesophageal reflux disease)   . Heart murmur   . Colon polyp   . Aortic stenosis     PAST SURGICAL HISTORY: Past Surgical History  Procedure Laterality Date  . Cardiac catheterization  11/2002  . Cardiac catheterization  05/21/2013    showing 30% oLM, 30% D1, 20% mLCx, 30% pRCA; EF 60%, severe AS (mean gradient 28 mmHg, peak 26, area 0.9)  .  Transthoracic echocardiogram  05/19/2013    EF 60-65%, impaired diastolic function, mild LA dilatation, mild MR/AI/TR, mod AS, high normal RVSP (30.4 mmHg)  . Transesophageal echocardiogram  05/22/2013    Preserved EF, moderate AS (valve area by planimetry between 1.0-1.26 cm sq), moderate aortic arch and descending aortic atherosclerosis.   . Cholecystectomy  2001    SOCIAL HISTORY:  Social History  Substance Use Topics  . Smoking status: Former Smoker -- 1.50 packs/[place] for 20 years    Types: Cigarettes    Quit date: 02/22/1981  . Smokeless tobacco: Never Used  . Alcohol Use: No    FAMILY HISTORY:  Family History  Problem Relation Age of Onset  . Heart disease Mother   . Heart disease Father 16  . Stroke Brother     DRUG ALLERGIES:  Allergies  Allergen Reactions  . Aspirin Rash and Other (See Comments)    Reaction: Trouble breathing     REVIEW OF SYSTEMS:   CONSTITUTIONAL: No fever, fatigue or weakness.  EYES: No blurred or double vision.  EARS, NOSE, AND THROAT: No tinnitus or ear pain.  RESPIRATORY: No cough, shortness of breath, wheezing or hemoptysis.  CARDIOVASCULAR: Has chest pain,no orthopnea, edema.  GASTROINTESTINAL: No nausea, vomiting, diarrhea or abdominal pain.  GENITOURINARY: No dysuria, hematuria.  ENDOCRINE: No polyuria, nocturia,  HEMATOLOGY: No  anemia, easy bruising or bleeding SKIN: No rash or lesion. MUSCULOSKELETAL: No joint pain or arthritis.   NEUROLOGIC: No tingling, numbness, weakness.  PSYCHIATRY: No anxiety or depression.   MEDICATIONS AT HOME:  Prior to Admission medications   Medication Sig Start Date End Date Taking? Authorizing Provider  atorvastatin (LIPITOR) 40 MG tablet TAKE 1 TABLET DAILY 02/24/15  Yes Antonieta Iba, MD  clopidogrel (PLAVIX) 75 MG tablet TAKE 1 TABLET DAILY 02/24/15  Yes Antonieta Iba, MD  furosemide (LASIX) 20 MG tablet TAKE 1 TABLET DAILY 02/24/15  Yes Antonieta Iba, MD  KLOR-CON M10 10 MEQ tablet TAKE  1 TABLET DAILY 02/24/15  Yes Antonieta Iba, MD  lisinopril (PRINIVIL,ZESTRIL) 10 MG tablet TAKE 1 TABLET DAILY 02/24/15  Yes Antonieta Iba, MD  TOPROL XL 25 MG 24 hr tablet TAKE 1 TABLET DAILY 02/24/15  Yes Antonieta Iba, MD      PHYSICAL EXAMINATION:   VITAL SIGNS: Blood pressure 131/56, pulse 82, temperature 97.4 F (36.3 C), temperature source Oral, resp. rate 27, height  (1.803 m), weight 117.935 kg (260 lb), SpO2 98 %.  GENERAL:  82 y.o.-year-old patient lying in the bed with no acute distress.  EYES: Pupils equal, round, reactive to light and accommodation. No scleral icterus. Extraocular muscles intact.  HEENT: Head atraumatic, normocephalic. Oropharynx and nasopharynx clear.  NECK:  Supple, no jugular venous distention. No thyroid enlargement, no tenderness.  LUNGS: Normal breath sounds bilaterally, no wheezing, rales,rhonchi or crepitation. No use of accessory muscles of respiration.  CARDIOVASCULAR: S1, S2 normal. No murmurs, rubs, or gallops.  ABDOMEN: Soft,obese, nontender, nondistended. Bowel sounds present. No organomegaly or mass.  EXTREMITIES: No pedal edema, cyanosis, or clubbing.  NEUROLOGIC: Cranial nerves II through XII are intact. Muscle strength 5/5 in all extremities. Sensation intact. Gait normal PSYCHIATRIC: The patient is alert and oriented x 3.  SKIN: No obvious rash, lesion, or ulcer.   LABORATORY PANEL:   CBC  Recent Labs Lab 06/21/15 0403  WBC 9.6  HGB 14.0  HCT 42.3  PLT 131*  MCV 82.5  MCH 27.3  MCHC 33.0  RDW 14.3   ------------------------------------------------------------------------------------------------------------------  Chemistries   Recent Labs Lab 06/21/15 0403  NA 139  K 4.1  CL 103  CO2 31  GLUCOSE 128*  BUN 11  CREATININE 0.76  CALCIUM 8.8*   ------------------------------------------------------------------------------------------------------------------ estimated creatinine clearance is 92.9  mL/min (by C-G formula based on Cr of 0.76). ------------------------------------------------------------------------------------------------------------------ No results for input(s): TSH, T4TOTAL, T3FREE, THYROIDAB in the last 72 hours.  Invalid input(s): FREET3   Coagulation profile No results for input(s): INR, PROTIME in the last 168 hours. ------------------------------------------------------------------------------------------------------------------- No results for input(s): DDIMER in the last 72 hours. -------------------------------------------------------------------------------------------------------------------  Cardiac Enzymes  Recent Labs Lab 06/21/15 0403  TROPONINI <0.03   ------------------------------------------------------------------------------------------------------------------ Invalid input(s): POCBNP  ---------------------------------------------------------------------------------------------------------------  Urinalysis    Component Value Date/Time   COLORURINE YELLOW* 01/08/2015 1205   APPEARANCEUR CLEAR* 01/08/2015 1205   LABSPEC 1.011 01/08/2015 1205   PHURINE 6.0 01/08/2015 1205   GLUCOSEU NEGATIVE 01/08/2015 1205   HGBUR 1+* 01/08/2015 1205   BILIRUBINUR NEGATIVE 01/08/2015 1205   KETONESUR NEGATIVE 01/08/2015 1205   PROTEINUR NEGATIVE 01/08/2015 1205   NITRITE NEGATIVE 01/08/2015 1205   LEUKOCYTESUR NEGATIVE 01/08/2015 1205     RADIOLOGY: Dg Chest 2 View  06/21/2015  CLINICAL DATA:  Chest pain EXAM: CHEST  2 VIEW COMPARISON:  05/18/2013 FINDINGS: Normal heart size and stable aortic tortuosity. Stable appearance of the hila. There  is no edema, consolidation, effusion, or pneumothorax. IMPRESSION: Stable.  No evidence of acute cardiopulmonary disease. Electronically Signed   By: Marnee Spring M.D.   On: 06/21/2015 04:30    EKG: Orders placed or performed during the hospital encounter of 06/21/15  . ED EKG within 10 minutes  . ED  EKG within 10 minutes    IMPRESSION AND PLAN: 82 year old male patient with history of hypertension, type 2 diabetes mellitus, coronary artery disease, hyperlipidemia presented to the emergency room with chest pain. EKG was normal and first set of troponin was negative. Admitting diagnosis 1. Chest pain rule out acute coronary syndrome 2. Hypertension 3. Hyperlipidemia 4. Coronary artery disease 5. Type 2 diabetes mellitus Treatment plan Admit patient to telemetry under observation bed Patient allergic to aspirin Plavix 75 MG orally daily Anticoagulate patient with subcutaneous Lovenox 40 mg daily Cycle troponin to rule out ischemia Echocardiogram, cardiology consultation Supportive care.  All the records are reviewed and case discussed with ED provider. Management plans discussed with the patient, family and they are in agreement.  CODE STATUS:FULL Code Status History    This patient does not have a recorded code status. Please follow your organizational policy for patients in this situation.       TOTAL TIME TAKING CARE OF THIS PATIENT: .    Ihor Austin M.D on 06/21/2015 at 5:27 AM  Between 7am to 6pm - Pager - [phone]  After 6pm go to [email or web address] - password [email or web address] [facility]  [place]  [facility]  Office  [phone]  CC: Primary care physician; Carollee Leitz, NP

## 2015-06-21 NOTE — Discharge Summary (Signed)
[facility] - [place] at [facility]   PATIENT NAME: [name]    MR#:  [id number]  DATE OF BIRTH:  03-09-1931  DATE OF ADMISSION:  06/21/2015 ADMITTING PHYSICIAN: No admitting provider for patient encounter.  DATE OF DISCHARGE: 06/21/2015  ADMISSION DIAGNOSIS:  Chest pain  DISCHARGE DIAGNOSIS:  Principal Problem:   Chest pain at rest   SECONDARY DIAGNOSIS:   Past Medical History  Diagnosis Date  . Hiatal hernia   . Hypertension   . Coronary artery disease     a. Nonobstructive by 2004 cath b. Nonobstructive by 2015 cath  . Hyperlipidemia   . Asthma   . Osteoarthritis   . Obesity   . Onychomycosis   . Diabetes mellitus without complication (HCC)     a. Hgb A1C 5.9% 05/2013  . Arrhythmia   . Aortic atherosclerosis (HCC) 05/2013    Per TEE  . MI (myocardial infarction) (HCC)   . GERD (gastroesophageal reflux disease)   . Heart murmur   . Colon polyp   . Aortic stenosis     HOSPITAL COURSE:    82 year old male with a history of diabetes who presented with atypical chest pain.  1. Atypical chest pain: Patient has risk factors for coronary disease and therefore was admitted to hospital. Troponins were negative. Patient will follow-up with cardiology on Monday for outpatient stress test. He is on Plavix which should be continued.  2. Essential hypertension: Blood pressure is controlled. Continue lisinopril and metoprolol.  3. Hyperlipidemia: Continue atorvastatin  4. Diet-controlled diabetes: Patient will follow up with his PCP.   DISCHARGE CONDITIONS AND DIET:  Patient is stable for discharge on a diabetic heart healthy diet  CONSULTS OBTAINED:     DRUG ALLERGIES:   Allergies  Allergen Reactions  . Aspirin Rash and Other (See Comments)    Reaction: Trouble breathing     DISCHARGE MEDICATIONS:   Current Discharge Medication List    CONTINUE these medications which have NOT CHANGED   Details  atorvastatin (LIPITOR) 40 MG  tablet TAKE 1 TABLET DAILY Qty: 90 tablet, Refills: 2    clopidogrel (PLAVIX) 75 MG tablet TAKE 1 TABLET DAILY Qty: 90 tablet, Refills: 2    furosemide (LASIX) 20 MG tablet TAKE 1 TABLET DAILY Qty: 90 tablet, Refills: 2    KLOR-CON M10 10 MEQ tablet TAKE 1 TABLET DAILY Qty: 90 tablet, Refills: 2    lisinopril (PRINIVIL,ZESTRIL) 10 MG tablet TAKE 1 TABLET DAILY Qty: 90 tablet, Refills: 2    TOPROL XL 25 MG 24 hr tablet TAKE 1 TABLET DAILY Qty: 90 tablet, Refills: 2              Today   CHIEF COMPLAINT:  Patient is doing well this morning. He has had no chest pain since he's been in the hospital.   VITAL SIGNS:  Blood pressure 130/67, pulse 78, temperature 97.4 F (36.3 C), temperature source Oral, resp. rate 16, height  (1.803 m), weight 117.935 kg (260 lb), SpO2 97 %.   REVIEW OF SYSTEMS:  Review of Systems  Constitutional: Negative for fever, chills and malaise/fatigue.  HENT: Negative for sore throat.   Eyes: Negative for blurred vision.  Respiratory: Negative for cough, hemoptysis, shortness of breath and wheezing.   Cardiovascular: Negative for chest pain, palpitations and leg swelling.  Gastrointestinal: Negative for nausea, vomiting, abdominal pain, diarrhea and blood in stool.  Genitourinary: Negative for dysuria.  Musculoskeletal: Negative for back pain.  Neurological: Negative for dizziness,  tremors and headaches.  Endo/Heme/Allergies: Does not bruise/bleed easily.     PHYSICAL EXAMINATION:  GENERAL:  82 y.o.-year-old patient lying in the bed with no acute distress.  NECK:  Supple, no jugular venous distention. No thyroid enlargement, no tenderness.  LUNGS: Normal breath sounds bilaterally, no wheezing, rales,rhonchi  No use of accessory muscles of respiration.  CARDIOVASCULAR: S1, S2 normal. No murmurs, rubs, or gallops.  ABDOMEN: Soft, non-tender, non-distended. Bowel sounds present. No organomegaly or mass.  EXTREMITIES: No pedal edema,  cyanosis, or clubbing.  PSYCHIATRIC: The patient is alert and oriented x 3.  SKIN: No obvious rash, lesion, or ulcer.   DATA REVIEW:   CBC  Recent Labs Lab 06/21/15 0403  WBC 9.6  HGB 14.0  HCT 42.3  PLT 131*    Chemistries   Recent Labs Lab 06/21/15 0403  NA 139  K 4.1  CL 103  CO2 31  GLUCOSE 128*  BUN 11  CREATININE 0.76  CALCIUM 8.8*    Cardiac Enzymes  Recent Labs Lab 06/21/15 0403 06/21/15 0541  TROPONINI <0.03 <0.03    Microbiology Results  @  RADIOLOGY:  Dg Chest 2 View  06/21/2015  CLINICAL DATA:  Chest pain EXAM: CHEST  2 VIEW COMPARISON:  05/18/2013 FINDINGS: Normal heart size and stable aortic tortuosity. Stable appearance of the hila. There is no edema, consolidation, effusion, or pneumothorax. IMPRESSION: Stable.  No evidence of acute cardiopulmonary disease. Electronically Signed   By: Marnee Spring M.D.   On: 06/21/2015 04:30      Management plans discussed with the patient and he is in agreement. Stable for discharge   Patient should follow up with cardiology Monday  CODE STATUS:     Code Status Orders        Start     Ordered   06/21/15 0534  Full code   Continuous     06/21/15 0533    Code Status History    Date Active Date Inactive Code Status Order ID Comments User Context   This patient has a current code status but no historical code status.      TOTAL TIME TAKING CARE OF THIS PATIENT: 35 minutes.    Note: This dictation was prepared with Dragon dictation along with smaller phrase technology. Any transcriptional errors that result from this process are unintentional.  Roy Tokarz M.D on 06/21/2015 at 11:26 AM  Between 7am to 6pm - Pager - [phone] After 6pm go to [email or web address] - password [email or web address] [facility]  [place] [facility]  Office  [phone]  CC: Primary care physician; Carollee Leitz, NP

## 2015-06-21 NOTE — Consult Note (Signed)
CARDIOLOGY CONSULT NOTE     Primary Care Physician: Carollee Leitz, NP Referring Physician:  Admit Date: 06/21/2015  Reason for consultation:  [name] is a 82 y.o. male with a h/o hypertension, coronary artery disease, hyperlipidemia, and type 2 diabetes who presented to the emergency room with chest pain. His pain began this morning around 3:00 when he got up to use the bathroom. He says that his pain is better now that he is in the emergency room. He was walking to the bathroom and the pain began. He says the pain is made worse by taking a deep breath. He is unsure if laying flat makes the pain worse. He has no history of cough, fever, chills. And no sick contacts. Per report he has had a heart catheterization in the past which showed nonobstructive disease.  Currently, he denies symptoms of palpitations, chest pain, shortness of breath, orthopnea, PND, lower extremity edema, dizziness, presyncope, syncope, or neurologic sequela. The patient is tolerating medications without difficulties and is otherwise without complaint today.   Past Medical History  Diagnosis Date  . Hiatal hernia   . Hypertension   . Coronary artery disease     a. Nonobstructive by 2004 cath b. Nonobstructive by 2015 cath  . Hyperlipidemia   . Asthma   . Osteoarthritis   . Obesity   . Onychomycosis   . Diabetes mellitus without complication (HCC)     a. Hgb A1C 5.9% 05/2013  . Arrhythmia   . Aortic atherosclerosis (HCC) 05/2013    Per TEE  . MI (myocardial infarction) (HCC)   . GERD (gastroesophageal reflux disease)   . Heart murmur   . Colon polyp   . Aortic stenosis    Past Surgical History  Procedure Laterality Date  . Cardiac catheterization  11/2002  . Cardiac catheterization  05/21/2013    showing 30% oLM, 30% D1, 20% mLCx, 30% pRCA; EF 60%, severe AS (mean gradient 28 mmHg, peak 26, area 0.9)  . Transthoracic echocardiogram  05/19/2013    EF 60-65%, impaired diastolic function, mild LA dilatation,  mild MR/AI/TR, mod AS, high normal RVSP (30.4 mmHg)  . Transesophageal echocardiogram  05/22/2013    Preserved EF, moderate AS (valve area by planimetry between 1.0-1.26 cm sq), moderate aortic arch and descending aortic atherosclerosis.   . Cholecystectomy  2001    . atorvastatin  40 mg Oral Daily  . clopidogrel  75 mg Oral Daily  . enoxaparin (LOVENOX) injection  40 mg Subcutaneous Q24H  . furosemide  20 mg Oral Daily  . lisinopril  10 mg Oral Daily  . metoprolol succinate  25 mg Oral Daily  . nitroGLYCERIN  1 inch Topical 4 times per Lewman  . potassium chloride  10 mEq Oral Daily      Allergies  Allergen Reactions  . Aspirin Rash and Other (See Comments)    Reaction: Trouble breathing     Social History   Social History  . Marital Status: Married    Spouse Name: N/A  . Number of Children: N/A  . Years of Education: N/A   Occupational History  . Retired Teacher, music   Social History Main Topics  . Smoking status: Former Smoker -- 1.50 packs/[place] for 20 years    Types: Cigarettes    Quit date: 02/22/1981  . Smokeless tobacco: Never Used  . Alcohol Use: No  . Drug Use: No  . Sexual Activity: Not on file  Other Topics Concern  . Not on file   Social History Narrative   [name] lives in [place] with his wife. They have a son and daughter. He is retired from [employer]. He enjoys fishing.    Family History  Problem Relation Age of Onset  . Heart disease Mother   . Heart disease Father 52  . Stroke Brother     ROS- All systems are reviewed and negative except as per the HPI above  Physical Exam: Telemetry: Filed Vitals:   06/21/15 0730 06/21/15 0800 06/21/15 0830 06/21/15 0900  BP: 121/60 129/68 124/63 128/60  Pulse: 84 89 77 83  Temp:      TempSrc:      Resp: Height:      Weight:      SpO2: 97% 95% 98% 96%    GEN- The patient is well appearing, alert and oriented x 3 today.   Head- normocephalic,  atraumatic Eyes-  Sclera clear, conjunctiva pink Ears- hearing intact Oropharynx- clear Neck- supple, no JVP Lymph- no cervical lymphadenopathy Lungs- Clear to ausculation bilaterally, normal work of breathing Heart- Regular rate and rhythm,  2/6 systolic murmur at the base, 1/6 systolic murmur at the apex. No rubs or gallops, PMI not laterally displaced GI- soft, NT, ND, + BS Extremities- no clubbing, cyanosis, or edema MS- no significant deformity or atrophy Skin- no rash or lesion Psych- euthymic mood, full affect Neuro- strength and sensation are intact  EKG: sinus rhythm with right bundle branch block  Labs:   Lab Results  Component Value Date   WBC 9.6 06/21/2015   HGB 14.0 06/21/2015   HCT 42.3 06/21/2015   MCV 82.5 06/21/2015   PLT 131* 06/21/2015    Recent Labs Lab 06/21/15 0403  NA 139  K 4.1  CL 103  CO2 31  BUN 11  CREATININE 0.76  CALCIUM 8.8*  GLUCOSE 128*   Lab Results  Component Value Date   CKTOTAL 130 05/19/2013   CKMB 6.4* 05/19/2013   TROPONINI <0.03 06/21/2015    Lab Results  Component Value Date   CHOL 132 01/13/2015   CHOL 151 05/19/2013   Lab Results  Component Value Date   HDL 44.30 01/13/2015   HDL 49 05/19/2013   Lab Results  Component Value Date   LDLCALC 73 01/13/2015   LDLCALC 90 05/19/2013   Lab Results  Component Value Date   TRIG 72.0 01/13/2015   TRIG 58 05/19/2013   Lab Results  Component Value Date   CHOLHDL 3 01/13/2015   No results found for: LDLDIRECT    Radiology: No evidence of acute cardiopulmonary disease  Echo: 10/22/14 EF 55-60%, mild concentric LVH, no wall motion abnormalities, moderate aortic stenosis  ASSESSMENT AND PLAN:   1. Chest pain: The pain is somewhat atypical, but he does have risk factors for coronary artery disease. At this time agree with cycling of troponins. He is only had essentially one troponin which was negative, the other troponin was drawn within an hour of the first. Would  cycle 3 troponins, and form stress test after the 3 troponins have been done. He has had Lovenox and agree with continuing that. Also agree with aspirin.  2. Hypertension: Continue lisinopril. Would hold metoprolol for stress testing.  3. Hyperlipidemia: continue of atorvastatin.   Jeffery Zimmerle Jorja Loa, MD 06/21/2015  10:04 AM

## 2015-06-21 NOTE — ED Notes (Signed)
Tammy called from cardiopulmonary and said that patient will not be able to have stress test until Monday because they only get 4 doses of the medication for the stress test on Saturday and the last dose has expired.   Patient needs to have 3 negative troponin's before the stress test can be done, 3rd troponin not due until 11:34.

## 2015-06-21 NOTE — ED Provider Notes (Signed)
[facility] Emergency Department Provider Note  ____________________________________________  Time seen: Approximately 4:35 AM  I have reviewed the triage vital signs and the nursing notes.   HISTORY  Chief Complaint Chest Pain    HPI [name] is a 82 y.o. male who presents to the ED from home via EMS with a chief complaint of chest pain. Patient has a history of CAD, hypertension, hyperlipidemia who was awakened from sleep with substernal chest pressure. Symptoms not associated with diaphoresis, nausea/vomiting, shortness of breath or dizziness. Patient denies recent fever, chills, cough, congestion, abdominal pain, diarrhea. Denies recent travel or trauma. Chest pressure was 10/10 at home; without intervention it has decreased to 3/10 on my interview and exam.   Past Medical History  Diagnosis Date  . Hiatal hernia   . Hypertension   . Coronary artery disease     a. Nonobstructive by 2004 cath b. Nonobstructive by 2015 cath  . Hyperlipidemia   . Asthma   . Osteoarthritis   . Obesity   . Onychomycosis   . Diabetes mellitus without complication (HCC)     a. Hgb A1C 5.9% 05/2013  . Arrhythmia   . Aortic atherosclerosis (HCC) 05/2013    Per TEE  . MI (myocardial infarction) (HCC)   . GERD (gastroesophageal reflux disease)   . Heart murmur   . Colon polyp   . Aortic stenosis     Patient Active Problem List   Diagnosis Date Noted  . Hard of hearing 03/26/2015  . Leg swelling 03/26/2015  . Morbid obesity (HCC) 03/26/2015  . Preoperative clearance 01/29/2015  . Preop cardiovascular exam 10/17/2014  . Pain in knee 06/29/2013  . Encounter to establish care 06/29/2013  . CAD (coronary artery disease), native coronary artery 06/05/2013  . Hypertension 06/05/2013  . Abnormal EKG 02/23/2012  . Hyperlipidemia 02/23/2012  . Diabetes mellitus (HCC) 02/23/2012  . Aortic valve stenosis 02/23/2012    Past Surgical History  Procedure Laterality Date   . Cardiac catheterization  11/2002  . Cardiac catheterization  05/21/2013    showing 30% oLM, 30% D1, 20% mLCx, 30% pRCA; EF 60%, severe AS (mean gradient 28 mmHg, peak 26, area 0.9)  . Transthoracic echocardiogram  05/19/2013    EF 60-65%, impaired diastolic function, mild LA dilatation, mild MR/AI/TR, mod AS, high normal RVSP (30.4 mmHg)  . Transesophageal echocardiogram  05/22/2013    Preserved EF, moderate AS (valve area by planimetry between 1.0-1.26 cm sq), moderate aortic arch and descending aortic atherosclerosis.   . Cholecystectomy  2001    Current Outpatient Rx  Name  Route  Sig  Dispense  Refill  . atorvastatin (LIPITOR) 40 MG tablet      TAKE 1 TABLET DAILY   90 tablet   2   . clopidogrel (PLAVIX) 75 MG tablet      TAKE 1 TABLET DAILY   90 tablet   2   . furosemide (LASIX) 20 MG tablet      TAKE 1 TABLET DAILY   90 tablet   2   . KLOR-CON M10 10 MEQ tablet      TAKE 1 TABLET DAILY   90 tablet   2   . lisinopril (PRINIVIL,ZESTRIL) 10 MG tablet      TAKE 1 TABLET DAILY   90 tablet   2   . TOPROL XL 25 MG 24 hr tablet      TAKE 1 TABLET DAILY   90 tablet   2  Allergies Aspirin  Family History  Problem Relation Age of Onset  . Heart disease Mother   . Heart disease Father 23  . Stroke Brother     Social History Social History  Substance Use Topics  . Smoking status: Former Smoker -- 1.50 packs/[place] for 20 years    Types: Cigarettes    Quit date: 02/22/1981  . Smokeless tobacco: Never Used  . Alcohol Use: No    Review of Systems Constitutional: No fever/chills Eyes: No visual changes. ENT: No sore throat. Cardiovascular: Positive for chest pain. Respiratory: Denies shortness of breath. Gastrointestinal: No abdominal pain.  No nausea, no vomiting.  No diarrhea.  No constipation. Genitourinary: Negative for dysuria. Musculoskeletal: Negative for back pain. Skin: Negative for rash. Neurological: Negative for headaches, focal weakness  or numbness.  10-point ROS otherwise negative.  ____________________________________________   PHYSICAL EXAM:  VITAL SIGNS: ED Triage Vitals  Enc Vitals Group     BP 06/21/15 0352 159/62 mmHg     Pulse Rate 06/21/15 0352 78     Resp 06/21/15 0352 18     Temp 06/21/15 0352 97.4 F (36.3 C)     Temp Source 06/21/15 0352 Oral     SpO2 06/21/15 0352 98 %     Weight 06/21/15 0352 260 lb (117.935 kg)     Height 06/21/15 0352 5\' 11"  (1.803 m)     Head Cir --      Peak Flow --      Pain Score 06/21/15 0352 3     Pain Loc --      Pain Edu? --      Excl. in GC? --     Constitutional: Alert and oriented. Well appearing and in no acute distress. Eyes: Conjunctivae are normal. PERRL. EOMI. Head: Atraumatic. Nose: No congestion/rhinnorhea. Mouth/Throat: Mucous membranes are moist.  Oropharynx non-erythematous. Neck: No stridor.  No carotid bruits. Cardiovascular: Normal rate, regular rhythm. II/VI SEM without gallops or rubs.  Good peripheral circulation. Respiratory: Normal respiratory effort.  No retractions. Lungs CTAB. Gastrointestinal: Soft and nontender. No distention. No abdominal bruits. No CVA tenderness. Musculoskeletal: No lower extremity tenderness nor edema.  No joint effusions. Neurologic:  Normal speech and language. No gross focal neurologic deficits are appreciated. No gait instability. Skin:  Skin is warm, dry and intact. No rash noted. Psychiatric: Mood and affect are normal. Speech and behavior are normal.  ____________________________________________   LABS (all labs ordered are listed, but only abnormal results are displayed)  Labs Reviewed  BASIC METABOLIC PANEL - Abnormal; Notable for the following:    Glucose, Bld 128 (*)    Calcium 8.8 (*)    All other components within normal limits  CBC - Abnormal; Notable for the following:    Platelets 131 (*)    All other components within normal limits  TROPONIN I    ____________________________________________  EKG  ED ECG REPORT I, Benedict Kue J, the attending physician, personally viewed and interpreted this ECG.   Date: 06/21/2015  EKG Time: 0350  Rate: 77  Rhythm: normal EKG, normal sinus rhythm  Axis: WNL  Intervals:right bundle branch block  ST&T Change: Nonspecific  ____________________________________________  RADIOLOGY  Chest 2 view (viewed by me, interpreted per Dr. Grace Isaac): Stable. No evidence of acute cardiopulmonary disease. ____________________________________________   PROCEDURES  Procedure(s) performed: None  Critical Care performed: No  ____________________________________________   INITIAL IMPRESSION / ASSESSMENT AND PLAN / ED COURSE  Pertinent labs & imaging results that were available during my care of  the patient were reviewed by me and considered in my medical decision making (see chart for details).  82 year old male with CAD, htn, hyperlipidemia who presents with chest pain. Patient is allergic to aspirin; initiate ntg paste, discuss with hospitalist for admission in the ED for admission. ____________________________________________   FINAL CLINICAL IMPRESSION(S) / ED DIAGNOSES  Final diagnoses:  Chest pain, unspecified chest pain type  Coronary artery disease involving native coronary artery of native heart with angina pectoris (HCC)  Essential hypertension  Hyperlipidemia  Type 2 diabetes mellitus with other circulatory complication (HCC)  Aortic valve stenosis      Irean Hong, MD 06/21/15 [phone]

## 2015-06-23 ENCOUNTER — Telehealth: Payer: Self-pay | Admitting: Nurse Practitioner

## 2015-06-23 NOTE — Telephone Encounter (Signed)
Appointment made for 07/03/15 with Naomie Dean

## 2015-06-23 NOTE — Telephone Encounter (Signed)
Pt daughter called to get a ED follow up for Diagnosis is Chest pains. Pt went to ED on Saturday  and was released Saturday afternoon. No appt avail to sch. Pt wants to only see [name]. Let me know where to sch. Call pt daughter Osie Bond Lee@ [phone]. Thank you!

## 2015-06-25 ENCOUNTER — Encounter: Payer: Self-pay | Admitting: Physician Assistant

## 2015-06-26 ENCOUNTER — Other Ambulatory Visit
Admission: RE | Admit: 2015-06-26 | Discharge: 2015-06-26 | Disposition: A | Discharge status: Home / Self Care | Payer: Medicare Other | Source: Ambulatory Visit | Attending: Physician Assistant | Admitting: Physician Assistant

## 2015-06-26 ENCOUNTER — Ambulatory Visit (INDEPENDENT_AMBULATORY_CARE_PROVIDER_SITE_OTHER): Payer: Medicare Other | Admitting: Physician Assistant

## 2015-06-26 ENCOUNTER — Encounter: Payer: Self-pay | Admitting: Physician Assistant

## 2015-06-26 VITALS — BP 134/60 | HR 72 | Ht 71.0 in | Wt 282.8 lb

## 2015-06-26 DIAGNOSIS — M7989 Other specified soft tissue disorders: Secondary | ICD-10-CM | POA: Diagnosis not present

## 2015-06-26 DIAGNOSIS — R079 Chest pain, unspecified: Secondary | ICD-10-CM

## 2015-06-26 DIAGNOSIS — E669 Obesity, unspecified: Secondary | ICD-10-CM

## 2015-06-26 DIAGNOSIS — I350 Nonrheumatic aortic (valve) stenosis: Secondary | ICD-10-CM

## 2015-06-26 DIAGNOSIS — I25119 Atherosclerotic heart disease of native coronary artery with unspecified angina pectoris: Secondary | ICD-10-CM

## 2015-06-26 DIAGNOSIS — I10 Essential (primary) hypertension: Secondary | ICD-10-CM

## 2015-06-26 DIAGNOSIS — E785 Hyperlipidemia, unspecified: Secondary | ICD-10-CM

## 2015-06-26 LAB — BASIC METABOLIC PANEL
Anion gap: 6 (ref 5–15)
BUN: 14 mg/dL (ref 6–20)
CHLORIDE: 105 mmol/L (ref 101–111)
CO2: 29 mmol/L (ref 22–32)
CREATININE: 0.71 mg/dL (ref 0.61–1.24)
Calcium: 8.9 mg/dL (ref 8.9–10.3)
GFR calc Af Amer: >60 mL/min (ref >60)
GFR calc non Af Amer: >60 mL/min (ref >60)
GLUCOSE: 119 mg/dL — AB (ref 65–99)
Potassium: 3.8 mmol/L (ref 3.5–5.1)
Sodium: 140 mmol/L (ref 135–145)

## 2015-06-26 NOTE — Progress Notes (Signed)
Cardiology Office Note Date:  06/26/2015  Patient ID:  [name], [name] April 15, 1931, MRN [id number] PCP:  Carollee Leitz, NP  Cardiologist:  Dr. Mariah Milling, MD    Chief Complaint: ED follow up for pleuritic chest pain  History of Present Illness: Norvil Lannom is a 82 y.o. male with history of nonobstructive CAD by cardiac cath in 2004 and 2015, moderate aortic stenosis, minimal carotid arterial disease, DM2, obesity, chronic lower extremity swelling, and tobacco abuse who presents for ED follow up of pleuritic chest pain.   Last cardiac cath in 05/21/2013 showed left main 30% stenosis, mid LAD diffuse 30%, mid LCx 20% proximal RCA 30%, mild systemic HTN, moderately elevated LVEDP, EF by echo 60%, there was severe aortic stenosis with a mean gradient of 28 mm Hg and a peak gradient of 36 mm Hg and acalculated valve area of 0.9. It was recommended he have a TEE for possible AVR. Outside echo in 2013 showed EF 55%, normal wall motion, mild diastolic dysfunction, mild LVH, trace MR, and an estimated aortic peak gradient of 55 mm Hg. He underwent TEE on 05/22/2013 that showed EF 60-65%, borderline LVH, mildly dilated left atrium, mild MR, mild AI, moderate aortic stenosis with a valve area between 1.0-1.26, moderate plaque involving the descending aorta and aortic arch. He last underwent echo 10/22/2014 for pre-op evaluation (he did not have this knee surgery done) that showed an EF of 55-60%, no RWMA, GR1DD, moderate aortic stenosis with a mean gradient of 28 mm Hg, valve area of (VTI) 1.07, valve area (Vmax) 1.08, left atrium mildly dilated. He has chronic lower extremity swelling from previous trauma. He does not wear compression hose. Prior lower extremity dopplers have been negative for DVT, most recently 05/08/2015.   He was seen in the ED and consulted on by cardiology 06/21/2015 for nonexertional, pleuritic chest pain that woke him from his sleep. No associated symptoms. BP was elevated at 159 systolic. Troponin  was negative x 3. CXR non-acute. He was discharged with outpatient follow up.   Since his ED visit he has been doing well and has not had any further chest pain. His lower extremity swelling is at baseline and he refuses to wear compression stockings. His weight is at baseline. He eats out a [employer] several times weekly along with a local hot dog restaurant. He does not weigh himself. He is leaning more towards getting his knee operated on at this time. He is tolerating his medications at this time. Back in September, Dr. Dareen Piano increased his Lasix to 40 mg daily and he has been tolerating this without issues.    Past Medical History  Diagnosis Date  . Hiatal hernia   . Hypertension   . Coronary artery disease     a. Nonobstructive by 2004 cath b. Nonobstructive by 2015 cath in setting of NSTEMI  . Hyperlipidemia   . Asthma   . Osteoarthritis   . Obesity   . Onychomycosis   . Diabetes mellitus without complication (HCC)     a. Hgb A1C 5.9% 05/2013  . Arrhythmia   . Aortic atherosclerosis (HCC) 05/2013    Per TEE  . GERD (gastroesophageal reflux disease)   . Heart murmur   . Colon polyp   . Moderate aortic stenosis     a. echo 2013: EF 55%, nl wall motion, mild DD, mild LVH, trace MR, moderate AS with peak gradient of 55 mm Hg; b. TEE 05/2013: EF EF 60-65%, borderline LVH, mild MR/AI,  mod AS with valve area 1.0-1.26; c. echo 10/2014: 55-60%, no RWMA, GR1DD, mod AS w/ mean gradient of 28 mm Hg       Past Surgical History  Procedure Laterality Date  . Cardiac catheterization  11/2002  . Cardiac catheterization  05/21/2013    showing 30% oLM, 30% D1, 20% mLCx, 30% pRCA; EF 60%, severe AS (mean gradient 28 mmHg, peak 26, area 0.9)  . Transthoracic echocardiogram  05/19/2013    EF 60-65%, impaired diastolic function, mild LA dilatation, mild MR/AI/TR, mod AS, high normal RVSP (30.4 mmHg)  . Transesophageal echocardiogram  05/22/2013    Preserved EF, moderate AS (valve area by planimetry  between 1.0-1.26 cm sq), moderate aortic arch and descending aortic atherosclerosis.   . Cholecystectomy  2001    Current Outpatient Prescriptions  Medication Sig Dispense Refill  . atorvastatin (LIPITOR) 40 MG tablet TAKE 1 TABLET DAILY 90 tablet 2  . clopidogrel (PLAVIX) 75 MG tablet TAKE 1 TABLET DAILY 90 tablet 2  . furosemide (LASIX) 40 MG tablet Take 40 mg by mouth daily.    Marland Kitchen KLOR-CON M10 10 MEQ tablet TAKE 1 TABLET DAILY 90 tablet 2  . lisinopril (PRINIVIL,ZESTRIL) 10 MG tablet TAKE 1 TABLET DAILY 90 tablet 2  . TOPROL XL 25 MG 24 hr tablet TAKE 1 TABLET DAILY 90 tablet 2   No current facility-administered medications for this visit.    Allergies:   Aspirin   Social History:  The patient  reports that he quit smoking about 34 years ago. His smoking use included Cigarettes. He has a 30 pack-year smoking history. He has never used smokeless tobacco. He reports that he does not drink alcohol or use illicit drugs.   Family History:  The patient's family history includes Heart disease in his mother; Heart disease (age of onset: 23) in his father; Stroke in his brother.  ROS:   Review of Systems  Constitutional: Negative for fever, chills, weight loss, malaise/fatigue and diaphoresis.  HENT: Positive for congestion.   Eyes: Negative for discharge and redness.  Respiratory: Positive for cough. Negative for hemoptysis, sputum production, shortness of breath and wheezing.        Dry cough x 1 Raptis  Cardiovascular: Positive for leg swelling. Negative for chest pain, palpitations, orthopnea, claudication and PND.  Gastrointestinal: Negative for heartburn, nausea, vomiting and abdominal pain.  Musculoskeletal: Negative for myalgias, back pain, joint pain and falls.  Skin: Negative for rash.  Neurological: Negative for dizziness, sensory change, speech change, focal weakness, loss of consciousness and weakness.  Endo/Heme/Allergies: Does not bruise/bleed easily.  Psychiatric/Behavioral:  Negative for substance abuse. The patient is not nervous/anxious.   All other systems reviewed and are negative.    PHYSICAL EXAM:  VS:  BP 134/60 mmHg  Pulse 72  Ht 5\' 11"  (1.803 m)  Wt 282 lb 12 oz (128.255 kg)  BMI 39.45 kg/m2 BMI: Body mass index is 39.45 kg/(m^2). Well nourished, well developed, in no acute distress HEENT: normocephalic, atraumatic Neck: no JVD, carotid bruits or masses Cardiac:  normal S1, S2; RRR; II/VI systolic murmurs, no rubs, or gallops Lungs:  clear to auscultation bilaterally, no wheezing, rhonchi or rales Abd: obese, distended, soft, nontender, no hepatomegaly, + BS MS: no deformity or atrophy Ext: 1+ pitting edema to the bilateral knees Skin: warm and dry, no rash Neuro:  moves all extremities spontaneously, no focal abnormalities noted, follows commands Psych: euthymic mood, full affect   EKG:  Was ordered today. Shows NSR, 72 bpm,  RBBB  Recent Labs: 01/13/2015: ALT 12 03/26/2015: Pro B Natriuretic peptide (BNP) 42.0 06/21/2015: Hemoglobin 14.0; Platelets 131* 06/26/2015: BUN 14; Creatinine, Ser 0.71; Potassium 3.8; Sodium 140  01/13/2015: Cholesterol 132; HDL 44.30; LDL Cholesterol 73; Total CHOL/HDL Ratio 3; Triglycerides 72.0; VLDL 14.4   Estimated Creatinine Clearance: 97.2 mL/min (by C-G formula based on Cr of 0.71).   Wt Readings from Last 3 Encounters:  06/26/15 282 lb 12 oz (128.255 kg)  06/21/15 260 lb (117.935 kg)  06/05/15 281 lb (127.461 kg)     Other studies reviewed: Additional studies/records reviewed today include: summarized above  ASSESSMENT AND PLAN:  1. Chest pain: -No further symptoms -Schedule Lexiscan Myoview to evaluate for high risk ischemia -Symptoms appeared to be atypical, possibly related to #2  2. Moderate aortic stenosis: -Check echo to evaluate his AS and right-sided pressure -He is back to his baseline  3. Chronic lower extremity swelling: -On Lasix 40 mg daily and tolerating without issues, check  bmet -He eats a poor diet consisting of high salt foods -Needs to decrease restaurant foods -Wear compression stockings  4. Obesity: -Weight loss advised -Possible sleep apnea  5.         HTN: -Continue current medications  6.         HLD: -Continue current medications    Disposition: F/u with Dr. Mariah Milling, MD in 1 month  Current medicines are reviewed at length with the patient today.  The patient did not have any concerns regarding medicines.  Elinor Dodge PA-C 06/26/2015 1:13 PM     [facility] [place] [address] [place], [place] [address] [phone]

## 2015-06-26 NOTE — Patient Instructions (Addendum)
Medication Instructions:  Your physician recommends that you continue on your current medications as directed. Please refer to the Current Medication list given to you today.   Labwork: BMET  Testing/Procedures: Your physician has requested that you have an echocardiogram. Echocardiography is a painless test that uses sound waves to create images of your heart. It provides your doctor with information about the size and shape of your heart and how well your heart's chambers and valves are working. This procedure takes approximately one hour. There are no restrictions for this procedure.  Your physician has requested that you have a lexiscan myoview. For further information please visit [email or web address] Please follow instruction sheet, as given.  [facility] MYOVIEW  Your caregiver has ordered a Stress Test with nuclear imaging. The purpose of this test is to evaluate the blood supply to your heart muscle. This procedure is referred to as a "Non-Invasive Stress Test." This is because other than having an IV started in your vein, nothing is inserted or "invades" your body. Cardiac stress tests are done to find areas of poor blood flow to the heart by determining the extent of coronary artery disease (CAD). Some patients exercise on a treadmill, which naturally increases the blood flow to your heart, while others who are  unable to walk on a treadmill due to physical limitations have a pharmacologic/chemical stress agent called Lexiscan . This medicine will mimic walking on a treadmill by temporarily increasing your coronary blood flow.   Please note: these test may take anywhere between 2-4 hours to complete  PLEASE REPORT TO [facility] ENTRANCE  THE VOLUNTEERS AT THE FIRST DESK WILL DIRECT YOU WHERE TO GO  Date of Procedure:______Feb 13_______________________________  Arrival Time for Procedure:____7:45am__________________________  Instructions regarding medication:     _xx___:  Hold  metoprolol the night before procedure and morning of procedure   PLEASE NOTIFY THE OFFICE AT LEAST 24 HOURS IN ADVANCE IF YOU ARE UNABLE TO KEEP YOUR APPOINTMENT.  [phone] AND  PLEASE NOTIFY NUCLEAR MEDICINE AT [facility] AT LEAST 24 HOURS IN ADVANCE IF YOU ARE UNABLE TO KEEP YOUR APPOINTMENT. [phone]  How to prepare for your Myoview test:   Do not eat or drink after midnight  No caffeine for 24 hours prior to test  No smoking 24 hours prior to test.  Your medication may be taken with water.  If your doctor stopped a medication because of this test, do not take that medication.  Ladies, please do not wear dresses.  Skirts or pants are appropriate. Please wear a short sleeve shirt.  No perfume, cologne or lotion.  Wear comfortable walking shoes. No heels!            Follow-Up: Your physician recommends that you schedule a follow-up appointment in: one month with Dr. Mariah Milling.    Any Other Special Instructions Will Be Listed Below (If Applicable).     If you need a refill on your cardiac medications before your next appointment, please call your pharmacy.  Echocardiogram An echocardiogram, or echocardiography, uses sound waves (ultrasound) to produce an image of your heart. The echocardiogram is simple, painless, obtained within a short period of time, and offers valuable information to your health care provider. The images from an echocardiogram can provide information such as:  Evidence of coronary artery disease (CAD).  Heart size.  Heart muscle function.  Heart valve function.  Aneurysm detection.  Evidence of a past heart attack.  Fluid buildup around the heart.  Heart muscle thickening.  Assess heart valve function. LET [facility] CARE PROVIDER KNOW ABOUT:  Any allergies you have.  All medicines you are taking, including vitamins, herbs, eye drops, creams, and over-the-counter medicines.  Previous problems you or members of your family have  had with the use of anesthetics.  Any blood disorders you have.  Previous surgeries you have had.  Medical conditions you have.  Possibility of pregnancy, if this applies. BEFORE THE PROCEDURE  No special preparation is needed. Eat and drink normally.  PROCEDURE   In order to produce an image of your heart, gel will be applied to your chest and a wand-like tool (transducer) will be moved over your chest. The gel will help transmit the sound waves from the transducer. The sound waves will harmlessly bounce off your heart to allow the heart images to be captured in real-time motion. These images will then be recorded.  You may need an IV to receive a medicine that improves the quality of the pictures. AFTER THE PROCEDURE You may return to your normal schedule including diet, activities, and medicines, unless your health care provider tells you otherwise.   This information is not intended to replace advice given to you by your health care provider. Make sure you discuss any questions you have with your health care provider.   Document Released: 04/30/2000 Document Revised: 05/24/2014 Document Reviewed: 01/08/2013 Elsevier Interactive Patient Education 2016 [employer] Cardiac Nuclear Scanning A cardiac nuclear scan is used to check your heart for problems, such as the following:  A portion of the heart is not getting enough blood.  Part of the heart muscle has died, which happens with a heart attack.  The heart wall is not working normally.  In this test, a radioactive dye (tracer) is injected into your bloodstream. After the tracer has traveled to your heart, a scanning device is used to measure how much of the tracer is absorbed by or distributed to various areas of your heart. LET [facility] CARE PROVIDER KNOW ABOUT:  Any allergies you have.  All medicines you are taking, including vitamins, herbs, eye drops, creams, and over-the-counter medicines.  Previous problems you  or members of your family have had with the use of anesthetics.  Any blood disorders you have.  Previous surgeries you have had.  Medical conditions you have.  RISKS AND COMPLICATIONS Generally, this is a safe procedure. However, as with any procedure, problems can occur. Possible problems include:   Serious chest pain.  Rapid heartbeat.  Sensation of warmth in your chest. This usually passes quickly. BEFORE THE PROCEDURE Ask your health care provider about changing or stopping your regular medicines. PROCEDURE This procedure is usually done at a hospital and takes 2-4 hours.  An IV tube is inserted into one of your veins.  Your health care provider will inject a small amount of radioactive tracer through the tube.  You will then wait for 20-40 minutes while the tracer travels through your bloodstream.  You will lie down on an exam table so images of your heart can be taken. Images will be taken for about 15-20 minutes.  You will exercise on a treadmill or stationary bike. While you exercise, your heart activity will be monitored with an electrocardiogram (ECG), and your blood pressure will be checked.  If you are unable to exercise, you may be given a medicine to make your heart beat faster.  When blood flow to your heart has peaked, tracer will again be injected through the IV  tube.  After 20-40 minutes, you will get back on the exam table and have more images taken of your heart.  When the procedure is over, your IV tube will be removed. AFTER THE PROCEDURE  You will likely be able to leave shortly after the test. Unless your health care provider tells you otherwise, you may return to your normal schedule, including diet, activities, and medicines.  Make sure you find out how and when you will get your test results.   This information is not intended to replace advice given to you by your health care provider. Make sure you discuss any questions you have with your  health care provider.   Document Released: 05/28/2004 Document Revised: 05/08/2013 Document Reviewed: 04/11/2013 Elsevier Interactive Patient Education [employer]

## 2015-06-30 ENCOUNTER — Encounter
Admission: RE | Admit: 2015-06-30 | Discharge: 2015-06-30 | Disposition: A | Discharge status: Home / Self Care | Payer: Medicare Other | Source: Ambulatory Visit | Attending: Physician Assistant | Admitting: Physician Assistant

## 2015-06-30 DIAGNOSIS — R079 Chest pain, unspecified: Secondary | ICD-10-CM | POA: Diagnosis not present

## 2015-06-30 LAB — NM MYOCAR MULTI W/SPECT W/WALL MOTION / EF
CHL CUP NUCLEAR SDS: 0
CHL CUP NUCLEAR SRS: 4
CHL CUP STRESS STAGE 2 GRADE: 0 %
CHL CUP STRESS STAGE 2 HR: 60 {beats}/min
CHL CUP STRESS STAGE 3 GRADE: 0 %
CHL CUP STRESS STAGE 4 GRADE: 0 %
CHL CUP STRESS STAGE 5 GRADE: 0 %
CHL CUP STRESS STAGE 5 HR: 100 {beats}/min
CHL CUP STRESS STAGE 6 DBP: 97 mmHg
CHL CUP STRESS STAGE 6 SBP: 160 mmHg
CSEPEW: 1 METS
CSEPPHR: 92 {beats}/min
LV dias vol: 127 mL
LV sys vol: 69 mL
Percent HR: 74 %
Percent of predicted max HR: 66 %
Rest HR: 63 {beats}/min
SSS: 3
Stage 1 HR: 61 {beats}/min
Stage 2 Speed: 0 mph
Stage 3 HR: 60 {beats}/min
Stage 3 Speed: 0 mph
Stage 4 HR: 92 {beats}/min
Stage 4 Speed: 0 mph
Stage 5 Speed: 0 mph
Stage 6 Grade: 0 %
Stage 6 HR: 90 {beats}/min
Stage 6 Speed: 0 mph
TID: 1.07

## 2015-06-30 MED ORDER — REGADENOSON 0.4 MG/5ML IV SOLN
0.4000 mg | Freq: Once | INTRAVENOUS | Status: AC
  Administered 2015-06-30: 0.4 mg via INTRAVENOUS

## 2015-06-30 MED ORDER — TECHNETIUM TC 99M SESTAMIBI - CARDIOLITE
13.9080 | Freq: Once | INTRAVENOUS | Status: AC | PRN
  Administered 2015-06-30: 13.908 via INTRAVENOUS

## 2015-06-30 MED ORDER — TECHNETIUM TC 99M SESTAMIBI - CARDIOLITE
31.3390 | Freq: Once | INTRAVENOUS | Status: AC | PRN
  Administered 2015-06-30: 31.339 via INTRAVENOUS

## 2015-07-03 ENCOUNTER — Encounter: Payer: Self-pay | Admitting: Nurse Practitioner

## 2015-07-03 ENCOUNTER — Ambulatory Visit (INDEPENDENT_AMBULATORY_CARE_PROVIDER_SITE_OTHER): Payer: Medicare Other | Admitting: Nurse Practitioner

## 2015-07-03 VITALS — BP 140/58 | HR 57 | Temp 97.6°F | Ht 71.0 in | Wt 282.0 lb

## 2015-07-03 DIAGNOSIS — R079 Chest pain, unspecified: Secondary | ICD-10-CM | POA: Diagnosis not present

## 2015-07-03 DIAGNOSIS — M7989 Other specified soft tissue disorders: Secondary | ICD-10-CM

## 2015-07-03 DIAGNOSIS — I739 Peripheral vascular disease, unspecified: Secondary | ICD-10-CM | POA: Diagnosis not present

## 2015-07-03 DIAGNOSIS — I25119 Atherosclerotic heart disease of native coronary artery with unspecified angina pectoris: Secondary | ICD-10-CM | POA: Diagnosis not present

## 2015-07-03 NOTE — Progress Notes (Signed)
Patient ID: [name], male    DOB: 1931-05-08  Age: 82 y.o. MRN: [id number]  CC: Hospitalization Follow-up   HPI [facility] [name] presents for ED follow-up for chest pain.   Patient was seen in the [facility] emergency department on 06/21/2015  He came to ED from EMS chief complaint of chest pain He was awoken with substernal chest pressure which was 10/10 and then decreased to 3/10 at the time he was seen in the emergency room  He has a history of aortic valve stenosis, CAD, EKG abnormality, hyperlipidemia, and DM   chest x-ray showed no acute cardiopulmonary disease  platelets were slightly low  troponin was normal  From Note on 06/26/15 by Eula Listen, PA:  He was seen in the ED and consulted on by cardiology 06/21/2015 for nonexertional, pleuritic chest pain that woke him from his sleep. No associated symptoms. BP was elevated at 159 systolic. Troponin was negative x 3. CXR non-acute. He was discharged with outpatient follow up.   Since his ED visit he has been doing well and has not had any further chest pain. His lower extremity swelling is at baseline and he refuses to wear compression stockings. His weight is at baseline. He eats out a [employer] several times weekly along with a local hot dog restaurant. He does not weigh himself. He is leaning more towards getting his knee operated on at this time. He is tolerating his medications at this time. Back in September, Dr. Dareen Piano increased his Lasix to 40 mg daily and he has been tolerating this without issues.   Today:   1) DM-  Eye exam- Recommended  Foot Exam- Today Diet- No formal  Exercise- No formal   Never weighs at home   2) Chest pain- No chest pains since leaving hospital  Feels much improved  No concerns today  History [name] has a past medical history of Hiatal hernia; Hypertension; Coronary artery disease; Hyperlipidemia; Asthma; Osteoarthritis; Obesity; Onychomycosis; Diabetes mellitus without  complication (HCC); Arrhythmia; Aortic atherosclerosis (HCC) (05/2013); GERD (gastroesophageal reflux disease); Heart murmur; Colon polyp; and Moderate aortic stenosis.   He has past surgical history that includes Cardiac catheterization (11/2002); Cardiac catheterization (05/21/2013); transthoracic echocardiogram (05/19/2013); transesophageal echocardiogram (05/22/2013); and Cholecystectomy (2001).   His family history includes Heart disease in his mother; Heart disease (age of onset: 24) in his father; Stroke in his brother.He reports that he quit smoking about 34 years ago. His smoking use included Cigarettes. He has a 30 pack-year smoking history. He has never used smokeless tobacco. He reports that he does not drink alcohol or use illicit drugs.  Outpatient Prescriptions Prior to Visit  Medication Sig Dispense Refill  . atorvastatin (LIPITOR) 40 MG tablet TAKE 1 TABLET DAILY 90 tablet 2  . clopidogrel (PLAVIX) 75 MG tablet TAKE 1 TABLET DAILY 90 tablet 2  . furosemide (LASIX) 40 MG tablet Take 40 mg by mouth daily.    Marland Kitchen KLOR-CON M10 10 MEQ tablet TAKE 1 TABLET DAILY 90 tablet 2  . lisinopril (PRINIVIL,ZESTRIL) 10 MG tablet TAKE 1 TABLET DAILY 90 tablet 2  . TOPROL XL 25 MG 24 hr tablet TAKE 1 TABLET DAILY 90 tablet 2   No facility-administered medications prior to visit.    ROS Review of Systems  Constitutional: Negative for fever, chills, diaphoresis and fatigue.  Eyes: Negative for visual disturbance.  Respiratory: Negative for chest tightness, shortness of breath and wheezing.   Cardiovascular: Positive for leg swelling. Negative for chest pain  and palpitations.  Gastrointestinal: Negative for nausea, vomiting and diarrhea.  Musculoskeletal: Positive for arthralgias and gait problem.  Skin: Negative for rash.  Neurological: Negative for dizziness, weakness and numbness.  Psychiatric/Behavioral: The patient is not nervous/anxious.    Objective:  BP 140/58 mmHg  Pulse 57  Temp(Src)  97.6 F (36.4 C) (Oral)  Ht  (1.803 m)  Wt 282 lb (127.914 kg)  BMI 39.35 kg/m2  SpO2 98%  Physical Exam  Constitutional: He is oriented to person, place, and time. He appears well-developed and well-nourished. No distress.  HENT:  Head: Normocephalic and atraumatic.  Right Ear: External ear normal.  Left Ear: External ear normal.  Cardiovascular: Normal rate and regular rhythm.  Exam reveals no gallop and no friction rub.   Murmur heard. Pulmonary/Chest: Effort normal and breath sounds normal. No respiratory distress. He has no wheezes. He has no rales. He exhibits no tenderness.  Neurological: He is alert and oriented to person, place, and time. Coordination abnormal.  Uses cane for ambulation  Skin: Skin is warm and dry. No rash noted. He is not diaphoretic.  Psychiatric: He has a normal mood and affect. His behavior is normal. Judgment and thought content normal.   Assessment & Plan:   [name] was seen today for hospitalization follow-up.  Diagnoses and all orders for this visit:  PVD (peripheral vascular disease) (HCC) -     ABI; Future  Chest pain at rest  Leg swelling   I am having [name] maintain his TOPROL XL, lisinopril, clopidogrel, atorvastatin, KLOR-CON M10, and furosemide.  No orders of the defined types were placed in this encounter.     Follow-up: Return in about 3 months (around 09/30/2015) for Follow up .

## 2015-07-03 NOTE — Patient Instructions (Signed)
PLEASE try and wear your compression hose  Watch salty foods in your diet  Stay active- work on walking a little bit further every few days.

## 2015-07-03 NOTE — Progress Notes (Signed)
Pre visit review using our clinic review tool, if applicable. No additional management support is needed unless otherwise documented below in the visit note. 

## 2015-07-07 NOTE — Assessment & Plan Note (Signed)
Checking ABI Discussed compression hose with patient he reports he does not have any We'll await results before prescribing for patient

## 2015-07-07 NOTE — Assessment & Plan Note (Signed)
Hospital follow-up for chest pain at rest Patient denies any further chest pain he is also followed up with cardiology regarding this I personally reviewed charts from discharge and from note from Eula Listen on 06/26/2015  Patient is getting further testing I am adding an ABI to this

## 2015-07-10 ENCOUNTER — Ambulatory Visit (INDEPENDENT_AMBULATORY_CARE_PROVIDER_SITE_OTHER): Payer: Medicare Other

## 2015-07-10 DIAGNOSIS — I350 Nonrheumatic aortic (valve) stenosis: Secondary | ICD-10-CM | POA: Diagnosis not present

## 2015-07-14 ENCOUNTER — Other Ambulatory Visit: Payer: Self-pay

## 2015-07-14 DIAGNOSIS — I350 Nonrheumatic aortic (valve) stenosis: Secondary | ICD-10-CM

## 2015-07-22 ENCOUNTER — Encounter: Payer: Self-pay | Admitting: Emergency Medicine

## 2015-07-22 ENCOUNTER — Emergency Department
Admission: EM | Admit: 2015-07-22 | Discharge: 2015-07-22 | Disposition: A | Discharge status: Home / Self Care | Payer: Medicare Other | Attending: Emergency Medicine | Admitting: Emergency Medicine

## 2015-07-22 DIAGNOSIS — Z79899 Other long term (current) drug therapy: Secondary | ICD-10-CM | POA: Insufficient documentation

## 2015-07-22 DIAGNOSIS — M25562 Pain in left knee: Secondary | ICD-10-CM | POA: Diagnosis not present

## 2015-07-22 DIAGNOSIS — E119 Type 2 diabetes mellitus without complications: Secondary | ICD-10-CM | POA: Diagnosis not present

## 2015-07-22 DIAGNOSIS — M25561 Pain in right knee: Secondary | ICD-10-CM | POA: Insufficient documentation

## 2015-07-22 DIAGNOSIS — Z7902 Long term (current) use of antithrombotics/antiplatelets: Secondary | ICD-10-CM | POA: Diagnosis not present

## 2015-07-22 DIAGNOSIS — I10 Essential (primary) hypertension: Secondary | ICD-10-CM | POA: Diagnosis not present

## 2015-07-22 DIAGNOSIS — Z87891 Personal history of nicotine dependence: Secondary | ICD-10-CM | POA: Diagnosis not present

## 2015-07-22 NOTE — ED Notes (Addendum)
Reports bilat knee pain.  States his MD told him his knees are worn out and needs surgery.  NAD sitting in wheelchair reading newspaper.

## 2015-07-22 NOTE — ED Provider Notes (Signed)
[facility] Emergency Department Provider Note  ____________________________________________  Time seen: Approximately 1:08 PM  I have reviewed the triage vital signs and the nursing notes.   HISTORY  Chief Complaint Knee Pain    HPI [name] is a 82 y.o. male , NAD, presents to the emergency department with history of bilateral knee pain. He is accompanied by family member who assists with history. States he has significant pain in the right knee last night that has now resolved. Patient states he no longer has knee pain but has a history of significant arthritis and has had injections in both knees in the past. Patient is unable to remember the names of his orthopedic physician or his primary care physician. Family member who accompanies also does not know the names of the treating physicians. The patient denies chest pain, shortness of breath, numbness, weakness, tingling, saddle paresthesias, lower back pain. Has not had any falls, injuries or blood traumas. Denies swelling, redness, warmth to any of his lower extremities.   Past Medical History  Diagnosis Date  . Hiatal hernia   . Hypertension   . Coronary artery disease     a. Nonobstructive by 2004 cath b. Nonobstructive by 2015 cath in setting of NSTEMI  . Hyperlipidemia   . Asthma   . Osteoarthritis   . Obesity   . Onychomycosis   . Diabetes mellitus without complication (HCC)     a. Hgb A1C 5.9% 05/2013  . Arrhythmia   . Aortic atherosclerosis (HCC) 05/2013    Per TEE  . GERD (gastroesophageal reflux disease)   . Heart murmur   . Colon polyp   . Moderate aortic stenosis     a. echo 2013: EF 55%, nl wall motion, mild DD, mild LVH, trace MR, moderate AS with peak gradient of 55 mm Hg; b. TEE 05/2013: EF EF 60-65%, borderline LVH, mild MR/AI, mod AS with valve area 1.0-1.26; c. echo 10/2014: 55-60%, no RWMA, GR1DD, mod AS w/ mean gradient of 28 mm Hg       Patient Active Problem List   Diagnosis  Date Noted  . Chest pain at rest 06/21/2015  . Hard of hearing 03/26/2015  . Leg swelling 03/26/2015  . Morbid obesity (HCC) 03/26/2015  . Preoperative clearance 01/29/2015  . Preop cardiovascular exam 10/17/2014  . Pain in knee 06/29/2013  . Encounter to establish care 06/29/2013  . CAD (coronary artery disease), native coronary artery 06/05/2013  . Hypertension 06/05/2013  . Abnormal EKG 02/23/2012  . Hyperlipidemia 02/23/2012  . Diabetes mellitus (HCC) 02/23/2012  . Aortic valve stenosis 02/23/2012    Past Surgical History  Procedure Laterality Date  . Cardiac catheterization  11/2002  . Cardiac catheterization  05/21/2013    showing 30% oLM, 30% D1, 20% mLCx, 30% pRCA; EF 60%, severe AS (mean gradient 28 mmHg, peak 26, area 0.9)  . Transthoracic echocardiogram  05/19/2013    EF 60-65%, impaired diastolic function, mild LA dilatation, mild MR/AI/TR, mod AS, high normal RVSP (30.4 mmHg)  . Transesophageal echocardiogram  05/22/2013    Preserved EF, moderate AS (valve area by planimetry between 1.0-1.26 cm sq), moderate aortic arch and descending aortic atherosclerosis.   . Cholecystectomy  2001    Current Outpatient Rx  Name  Route  Sig  Dispense  Refill  . atorvastatin (LIPITOR) 40 MG tablet      TAKE 1 TABLET DAILY   90 tablet   2   . clopidogrel (PLAVIX) 75 MG tablet  TAKE 1 TABLET DAILY   90 tablet   2   . furosemide (LASIX) 40 MG tablet   Oral   Take 40 mg by mouth daily.         Marland Kitchen KLOR-CON M10 10 MEQ tablet      TAKE 1 TABLET DAILY   90 tablet   2   . lisinopril (PRINIVIL,ZESTRIL) 10 MG tablet      TAKE 1 TABLET DAILY   90 tablet   2   . TOPROL XL 25 MG 24 hr tablet      TAKE 1 TABLET DAILY   90 tablet   2     Allergies Aspirin  Family History  Problem Relation Age of Onset  . Heart disease Mother   . Heart disease Father 59  . Stroke Brother     Social History Social History  Substance Use Topics  . Smoking status: Former Smoker  -- 1.50 packs/[place] for 20 years    Types: Cigarettes    Quit date: 02/22/1981  . Smokeless tobacco: Never Used  . Alcohol Use: No     Review of Systems  Constitutional: No fever/chills Eyes: No visual changes. Cardiovascular: No chest pain. Respiratory: No cough. No shortness of breath. No wheezing.  Gastrointestinal: No abdominal pain.  No nausea, vomiting.  No diarrhea.  Musculoskeletal: Positive bilateral knee pain (now resolved). Negative for back pain.  Skin: Negative for rash, redness, warmth, swelling. Neurological: Negative for headaches, focal weakness or numbness. No saddle paraesthesias.  10-point ROS otherwise negative.  ____________________________________________   PHYSICAL EXAM:  VITAL SIGNS: ED Triage Vitals  Enc Vitals Group     BP 07/22/15 1203 132/42 mmHg     Pulse Rate 07/22/15 1203 72     Resp 07/22/15 1203 18     Temp 07/22/15 1203 98.2 F (36.8 C)     Temp Source 07/22/15 1203 Oral     SpO2 07/22/15 1203 98 %     Weight 07/22/15 1203 250 lb (113.399 kg)     Height 07/22/15 1203  (1.803 m)     Head Cir --      Peak Flow --      Pain Score --      Pain Loc --      Pain Edu? --      Excl. in GC? --     Constitutional: Alert and oriented. Well appearing and in no acute distress. Eyes: Conjunctivae are normal. Head: Atraumatic. Neck: Supple with full range of motion. Cardiovascular:  Good peripheral circulation. Respiratory: Normal respiratory effort without tachypnea or retractions.  Musculoskeletal: No lower extremity tenderness nor edema.  No joint effusions. Neurologic:  Normal speech and language. No gross focal neurologic deficits are appreciated.  Skin:  Skin is warm, dry and intact. No rash noted. Psychiatric: Mood and affect are normal. Speech and behavior are normal.   ____________________________________________    LABS  None  ____________________________________________  EKG  None ____________________________________________  RADIOLOGY  None  ____________________________________________    PROCEDURES  Procedure(s) performed: None   Medications - No data to display   ____________________________________________   INITIAL IMPRESSION / ASSESSMENT AND PLAN / ED COURSE  Patient's diagnosis is consistent with bilateral knee pain more than likely due to osteoarthritis. After review of notes from the patient's primary care and cardiology consults, and no medications were given today. She may take Tylenol as needed for pain. Patient is to follow up with Dr. Lutricia Horsfall in orthopedics to establish care  and for further evaluation and treatment of knee pain and arthritis. Patient is given ED precautions to return to the ED for any worsening or new symptoms.    ____________________________________________  FINAL CLINICAL IMPRESSION(S) / ED DIAGNOSES  Final diagnoses:  Bilateral knee pain      NEW MEDICATIONS STARTED DURING THIS VISIT:  New Prescriptions   No medications on file         Hope Pigeon, PA-C 07/22/15 1316  Sharman Cheek, MD 07/22/15 1525

## 2015-07-22 NOTE — Discharge Instructions (Signed)
Joint Pain Joint pain, which is also called arthralgia, can be caused by many things. Joint pain often goes away when you follow your health care provider's instructions for relieving pain at home. However, joint pain can also be caused by conditions that require further treatment. Common causes of joint pain include:  Bruising in the area of the joint.  Overuse of the joint.  Wear and tear on the joints that occur with aging (osteoarthritis).  Various other forms of arthritis.  A buildup of a crystal form of uric acid in the joint (gout).  Infections of the joint (septic arthritis) or of the bone (osteomyelitis). Your health care provider may recommend medicine to help with the pain. If your joint pain continues, additional tests may be needed to diagnose your condition. HOME CARE INSTRUCTIONS Watch your condition for any changes. Follow these instructions as directed to lessen the pain that you are feeling.  Take medicines only as directed by your health care provider.  Rest the affected area for as long as your health care provider says that you should. If directed to do so, raise the painful joint above the level of your heart while you are sitting or lying down.  Do not do things that cause or worsen pain.  If directed, apply ice to the painful area:  Put ice in a plastic bag.  Place a towel between your skin and the bag.  Leave the ice on for 20 minutes, 2-3 times per Sachdeva.  Wear an elastic bandage, splint, or sling as directed by your health care provider. Loosen the elastic bandage or splint if your fingers or toes become numb and tingle, or if they turn cold and blue.  Begin exercising or stretching the affected area as directed by your health care provider. Ask your health care provider what types of exercise are safe for you.  Keep all follow-up visits as directed by your health care provider. This is important. SEEK MEDICAL CARE IF:  Your pain increases, and medicine  does not help.  Your joint pain does not improve within 3 days.  You have increased bruising or swelling.  You have a fever.  You lose 10 lb (4.5 kg) or more without trying. SEEK IMMEDIATE MEDICAL CARE IF:  You are not able to move the joint.  Your fingers or toes become numb or they turn cold and blue.   This information is not intended to replace advice given to you by your health care provider. Make sure you discuss any questions you have with your health care provider.   Document Released: 05/03/2005 Document Revised: 05/24/2014 Document Reviewed: 02/12/2014 Elsevier Interactive Patient Education 2016 Elsevier Inc.  Cryotherapy Cryotherapy is when you put ice on your injury. Ice helps lessen pain and puffiness (swelling) after an injury. Ice works the best when you start using it in the first 24 to 48 hours after an injury. HOME CARE  Put a dry or damp towel between the ice pack and your skin.  You may press gently on the ice pack.  Leave the ice on for no more than 10 to 20 minutes at a time.  Check your skin after 5 minutes to make sure your skin is okay.  Rest at least 20 minutes between ice pack uses.  Stop using ice when your skin loses feeling (numbness).  Do not use ice on someone who cannot tell you when it hurts. This includes small children and people with memory problems (dementia). GET HELP RIGHT  AWAY IF:  You have white spots on your skin.  Your skin turns blue or pale.  Your skin feels waxy or hard.  Your puffiness gets worse. MAKE SURE YOU:   Understand these instructions.  Will watch your condition.  Will get help right away if you are not doing well or get worse.   This information is not intended to replace advice given to you by your health care provider. Make sure you discuss any questions you have with your health care provider.   Document Released: 10/20/2007 Document Revised: 07/26/2011 Document Reviewed: 12/24/2010 Elsevier  Interactive Patient Education [employer] Elsevier Inc.

## 2015-07-22 NOTE — ED Notes (Signed)
See triage note   States he developed pain to both knees yesterday w/o injury  states pain eased off with rest   Has had both knee injected in past ..

## 2015-07-29 DIAGNOSIS — M1712 Unilateral primary osteoarthritis, left knee: Secondary | ICD-10-CM | POA: Diagnosis not present

## 2015-07-29 DIAGNOSIS — M25562 Pain in left knee: Secondary | ICD-10-CM | POA: Diagnosis not present

## 2015-07-29 DIAGNOSIS — M25561 Pain in right knee: Secondary | ICD-10-CM | POA: Diagnosis not present

## 2015-07-29 DIAGNOSIS — G8929 Other chronic pain: Secondary | ICD-10-CM | POA: Diagnosis not present

## 2015-08-07 ENCOUNTER — Ambulatory Visit (INDEPENDENT_AMBULATORY_CARE_PROVIDER_SITE_OTHER): Payer: Medicare Other | Admitting: Cardiovascular Disease

## 2015-08-07 ENCOUNTER — Encounter: Payer: Self-pay | Admitting: Cardiovascular Disease

## 2015-08-07 VITALS — BP 130/64 | HR 77 | Ht 71.0 in | Wt 283.5 lb

## 2015-08-07 DIAGNOSIS — R0602 Shortness of breath: Secondary | ICD-10-CM | POA: Diagnosis not present

## 2015-08-07 DIAGNOSIS — I10 Essential (primary) hypertension: Secondary | ICD-10-CM | POA: Diagnosis not present

## 2015-08-07 DIAGNOSIS — R9431 Abnormal electrocardiogram [ECG] [EKG]: Secondary | ICD-10-CM | POA: Diagnosis not present

## 2015-08-07 DIAGNOSIS — E1159 Type 2 diabetes mellitus with other circulatory complications: Secondary | ICD-10-CM

## 2015-08-07 DIAGNOSIS — I350 Nonrheumatic aortic (valve) stenosis: Secondary | ICD-10-CM

## 2015-08-07 DIAGNOSIS — I25119 Atherosclerotic heart disease of native coronary artery with unspecified angina pectoris: Secondary | ICD-10-CM

## 2015-08-07 NOTE — Patient Instructions (Addendum)
You are doing well. No medication changes were made.  We will repeat echocardiogram in one year for aortic valve stenosis  Please call us if you have new issues that need to be addressed before your next appt.  Your physician wants you to follow-up in: 6 months.  You will receive a reminder letter in the mail two months in advance. If you don't receive a letter, please call our office to schedule the follow-up appointment.  May take Furosemide (Water Pill) and Klor-Con (Potassium) after lunch if you have any shortness of breath or swelling.  Please call if you have any questions.

## 2015-08-07 NOTE — Progress Notes (Signed)
Patient ID: [name], male    DOB: [birth year], 82 y.o.   MRN: [id number]  HPI Comments: [name] is a very pleasant 82 year old gentleman with moderate to severe aortic valve stenosis, obesity, diabetes, smoking history for 30 years, mild coronary artery disease by cardiac catheterization in 2004,  normal ejection fraction with normal wall motion, minimal carotid arterial disease who presents for routine follow-up of his aortic valve disease Stop smoking 30 years ago  He reports having some shortness of breath when he climbs stairs Feels well when he is walking on flat surfaces Denies any chest pain on exertion Sometimes when anxious, has some palpitations No regular exercise program Chronic knee pain, walks with a cane  EKG on today's visit shows normal sinus rhythm with right bundle branch block, rate 77 bpm, no change from prior EKGs  Other past medical history reviewed Echocardiogram in July 2016  confirming moderate aortic valve stenosis  wife  is in a nursing home, had a bilateral stroke.  Has chronic right lower extremity swelling, previous trauma to the right leg. Does not wear a compression hose He reports that he stop smoking 30 years ago  Lower extremity venous Doppler reviewed with him from 05/08/2015 showing no DVT on the right   total cholesterol 132, LDL 73, hemoglobin A1c 5.9  Cardiac catheterization July 2004 showed 20% disease in the LAD, RCA and left circumflex Stress test in 2012 shows no significant ischemia, attenuation artifact in the septal wall  Vitamin D 13, hematocrit 42, creatinine 0.77, total cholesterol 157, HDL 54, LDL 89   Allergies  Allergen Reactions  . Aspirin Rash and Other (See Comments)    Reaction: Trouble breathing     Current Outpatient Prescriptions on File Prior to Visit  Medication Sig Dispense Refill  . atorvastatin (LIPITOR) 40 MG tablet TAKE 1 TABLET DAILY 90 tablet 2  . clopidogrel (PLAVIX) 75 MG tablet TAKE 1 TABLET DAILY 90  tablet 2  . furosemide (LASIX) 40 MG tablet Take 40 mg by mouth daily.    Marland Kitchen KLOR-CON M10 10 MEQ tablet TAKE 1 TABLET DAILY 90 tablet 2  . lisinopril (PRINIVIL,ZESTRIL) 10 MG tablet TAKE 1 TABLET DAILY 90 tablet 2  . TOPROL XL 25 MG 24 hr tablet TAKE 1 TABLET DAILY 90 tablet 2   No current facility-administered medications on file prior to visit.    Past Medical History  Diagnosis Date  . Hiatal hernia   . Hypertension   . Coronary artery disease     a. Nonobstructive by 2004 cath b. Nonobstructive by 2015 cath in setting of NSTEMI  . Hyperlipidemia   . Asthma   . Osteoarthritis   . Obesity   . Onychomycosis   . Diabetes mellitus without complication (HCC)     a. Hgb A1C 5.9% 05/2013  . Arrhythmia   . Aortic atherosclerosis (HCC) 05/2013    Per TEE  . GERD (gastroesophageal reflux disease)   . Heart murmur   . Colon polyp   . Moderate aortic stenosis     a. echo 2013: EF 55%, nl wall motion, mild DD, mild LVH, trace MR, moderate AS with peak gradient of 55 mm Hg; b. TEE 05/2013: EF EF 60-65%, borderline LVH, mild MR/AI, mod AS with valve area 1.0-1.26; c. echo 10/2014: 55-60%, no RWMA, GR1DD, mod AS w/ mean gradient of 28 mm Hg       Past Surgical History  Procedure Laterality Date  . Cardiac catheterization  11/2002  .  Cardiac catheterization  05/21/2013    showing 30% oLM, 30% D1, 20% mLCx, 30% pRCA; EF 60%, severe AS (mean gradient 28 mmHg, peak 26, area 0.9)  . Transthoracic echocardiogram  05/19/2013    EF 60-65%, impaired diastolic function, mild LA dilatation, mild MR/AI/TR, mod AS, high normal RVSP (30.4 mmHg)  . Transesophageal echocardiogram  05/22/2013    Preserved EF, moderate AS (valve area by planimetry between 1.0-1.26 cm sq), moderate aortic arch and descending aortic atherosclerosis.   . Cholecystectomy  2001    Social History  reports that he quit smoking about 34 years ago. His smoking use included Cigarettes. He has a 30 pack-year smoking history. He has never  used smokeless tobacco. He reports that he does not drink alcohol or use illicit drugs.  Family History family history includes Heart disease in his mother; Heart disease (age of onset: 8160) in his father; Stroke in his brother.   Review of Systems  Constitutional: Negative.   Respiratory: Negative.   Cardiovascular: Positive for leg swelling.       Right lower extremity  Gastrointestinal: Negative.   Musculoskeletal: Positive for gait problem.       Walks with a cane  Neurological: Negative.   Psychiatric/Behavioral: Negative.   All other systems reviewed and are negative.   BP 130/64 mmHg  Pulse 77  Ht 5\' 11"  (1.803 m)  Wt 283 lb 8 oz (128.595 kg)  BMI 39.56 kg/m2  Physical Exam  Constitutional: He is oriented to person, place, and time. He appears well-developed and well-nourished.  Morbidly obese  HENT:  Head: Normocephalic.  Nose: Nose normal.  Mouth/Throat: Oropharynx is clear and moist.  Eyes: Conjunctivae are normal. Pupils are equal, round, and reactive to light.  Neck: Normal range of motion. Neck supple. No JVD present.  Cardiovascular: Regular rhythm, S1 normal, S2 normal and intact distal pulses.  Bradycardia present.  Exam reveals no gallop and no friction rub.   Murmur heard.  Systolic murmur is present with a grade of 3/6  Nonpitting edema of the right lower extremity below the knee  Pulmonary/Chest: Effort normal and breath sounds normal. No respiratory distress. He has no wheezes. He has no rales. He exhibits no tenderness.  Abdominal: Soft. Bowel sounds are normal. He exhibits no distension. There is no tenderness.  Musculoskeletal: Normal range of motion. He exhibits edema. He exhibits no tenderness.  Lymphadenopathy:    He has no cervical adenopathy.  Neurological: He is alert and oriented to person, place, and time. Coordination normal.  Skin: Skin is warm and dry. No rash noted. No erythema.  Psychiatric: He has a normal mood and affect. His behavior  is normal. Judgment and thought content normal.      Assessment and Plan   Nursing note and vitals reviewed.

## 2015-08-07 NOTE — Assessment & Plan Note (Signed)
Currently with no symptoms of angina. No further workup at this time. Continue current medication regimen. 

## 2015-08-07 NOTE — Assessment & Plan Note (Signed)
Moderate to severe aortic valve stenosis on recent echocardiogram We will repeat echocardiogram in one year

## 2015-08-07 NOTE — Assessment & Plan Note (Signed)
We have encouraged continued exercise, careful diet management in an effort to lose weight. Limited by his knee pain

## 2015-08-07 NOTE — Assessment & Plan Note (Signed)
Blood pressure is well controlled on today's visit. No changes made to the medications. 

## 2015-08-14 DIAGNOSIS — M1712 Unilateral primary osteoarthritis, left knee: Secondary | ICD-10-CM | POA: Diagnosis not present

## 2015-08-21 DIAGNOSIS — M1712 Unilateral primary osteoarthritis, left knee: Secondary | ICD-10-CM | POA: Diagnosis not present

## 2015-08-28 DIAGNOSIS — M1712 Unilateral primary osteoarthritis, left knee: Secondary | ICD-10-CM | POA: Diagnosis not present

## 2015-09-02 DIAGNOSIS — E119 Type 2 diabetes mellitus without complications: Secondary | ICD-10-CM | POA: Diagnosis not present

## 2015-09-02 DIAGNOSIS — E1169 Type 2 diabetes mellitus with other specified complication: Secondary | ICD-10-CM | POA: Diagnosis not present

## 2015-09-02 DIAGNOSIS — E785 Hyperlipidemia, unspecified: Secondary | ICD-10-CM | POA: Diagnosis not present

## 2015-09-09 DIAGNOSIS — E785 Hyperlipidemia, unspecified: Secondary | ICD-10-CM | POA: Diagnosis not present

## 2015-09-09 DIAGNOSIS — E1169 Type 2 diabetes mellitus with other specified complication: Secondary | ICD-10-CM | POA: Diagnosis not present

## 2015-09-09 DIAGNOSIS — E119 Type 2 diabetes mellitus without complications: Secondary | ICD-10-CM | POA: Diagnosis not present

## 2015-09-09 DIAGNOSIS — I25119 Atherosclerotic heart disease of native coronary artery with unspecified angina pectoris: Secondary | ICD-10-CM | POA: Diagnosis not present

## 2015-09-09 DIAGNOSIS — E669 Obesity, unspecified: Secondary | ICD-10-CM | POA: Diagnosis not present

## 2015-09-09 DIAGNOSIS — I10 Essential (primary) hypertension: Secondary | ICD-10-CM | POA: Diagnosis not present

## 2015-09-23 ENCOUNTER — Telehealth: Payer: Self-pay | Admitting: Nurse Practitioner

## 2015-09-23 NOTE — Telephone Encounter (Signed)
Pt daughter Osie BondLawanner called after receiving a call. Lawanner was advised that the call was possibly about the pt up coming appt that needed to be rescheduled. Lawanner advised that the pt could schedule with either Dr. Adriana Simasook or Dr. Birdie SonsSonnenberg. Lawanner stated that she will speak with her father and call the office back.msn

## 2015-10-01 ENCOUNTER — Ambulatory Visit: Payer: Medicare Other | Admitting: Nurse Practitioner

## 2015-10-15 DIAGNOSIS — M1712 Unilateral primary osteoarthritis, left knee: Secondary | ICD-10-CM | POA: Diagnosis not present

## 2015-11-21 ENCOUNTER — Other Ambulatory Visit: Payer: Self-pay | Admitting: Cardiovascular Disease

## 2015-12-19 ENCOUNTER — Emergency Department: Payer: Medicare Other

## 2015-12-19 ENCOUNTER — Encounter: Payer: Self-pay | Admitting: Emergency Medicine

## 2015-12-19 ENCOUNTER — Emergency Department
Admission: EM | Admit: 2015-12-19 | Discharge: 2015-12-20 | Disposition: A | Discharge status: Home / Self Care | Payer: Medicare Other | Attending: Emergency Medicine | Admitting: Emergency Medicine

## 2015-12-19 DIAGNOSIS — I2510 Atherosclerotic heart disease of native coronary artery without angina pectoris: Secondary | ICD-10-CM | POA: Insufficient documentation

## 2015-12-19 DIAGNOSIS — M25561 Pain in right knee: Secondary | ICD-10-CM | POA: Diagnosis not present

## 2015-12-19 DIAGNOSIS — R609 Edema, unspecified: Secondary | ICD-10-CM | POA: Diagnosis not present

## 2015-12-19 DIAGNOSIS — Z7984 Long term (current) use of oral hypoglycemic drugs: Secondary | ICD-10-CM | POA: Diagnosis not present

## 2015-12-19 DIAGNOSIS — E119 Type 2 diabetes mellitus without complications: Secondary | ICD-10-CM | POA: Diagnosis not present

## 2015-12-19 DIAGNOSIS — Z87891 Personal history of nicotine dependence: Secondary | ICD-10-CM | POA: Insufficient documentation

## 2015-12-19 DIAGNOSIS — M7989 Other specified soft tissue disorders: Secondary | ICD-10-CM

## 2015-12-19 DIAGNOSIS — I10 Essential (primary) hypertension: Secondary | ICD-10-CM | POA: Diagnosis not present

## 2015-12-19 DIAGNOSIS — M79604 Pain in right leg: Secondary | ICD-10-CM | POA: Diagnosis not present

## 2015-12-19 DIAGNOSIS — J45909 Unspecified asthma, uncomplicated: Secondary | ICD-10-CM | POA: Diagnosis not present

## 2015-12-19 DIAGNOSIS — R600 Localized edema: Secondary | ICD-10-CM | POA: Diagnosis present

## 2015-12-19 LAB — COMPREHENSIVE METABOLIC PANEL
ALT: 18 U/L (ref 17–63)
AST: 21 U/L (ref 15–41)
Albumin: 3.3 g/dL — ABNORMAL LOW (ref 3.5–5.0)
Alkaline Phosphatase: 71 U/L (ref 38–126)
Anion gap: 6 (ref 5–15)
BILIRUBIN TOTAL: 1.1 mg/dL (ref 0.3–1.2)
BUN: 15 mg/dL (ref 6–20)
CHLORIDE: 109 mmol/L (ref 101–111)
CO2: 26 mmol/L (ref 22–32)
CREATININE: 0.79 mg/dL (ref 0.61–1.24)
Calcium: 9 mg/dL (ref 8.9–10.3)
Glucose, Bld: 122 mg/dL — ABNORMAL HIGH (ref 65–99)
Potassium: 3.6 mmol/L (ref 3.5–5.1)
Sodium: 141 mmol/L (ref 135–145)
TOTAL PROTEIN: 6.5 g/dL (ref 6.5–8.1)

## 2015-12-19 LAB — BRAIN NATRIURETIC PEPTIDE: B Natriuretic Peptide: 48 pg/mL (ref 0.0–100.0)

## 2015-12-19 NOTE — ED Notes (Signed)
Pt found in room att 

## 2015-12-19 NOTE — ED Provider Notes (Signed)
[facility] Emergency Department Provider Note   ____________________________________________   First MD Initiated Contact with Patient 12/19/15 2327     (approximate)  I have reviewed the triage vital signs and the nursing notes.   HISTORY  Chief Complaint Leg Swelling    HPI [name] is a 82 y.o. male who comes into the hospital today complaining of some right knee aching. He reports that when he tries to bend his leg it hurts. He reports that he couldn't sleep that way so he decided to come in and get it checked out. He reports it's been aching for 2 weeks. He also reports that he had some swelling to his left ankle and left leg. He reports it went down but then it seemed to come up on the right side. The patient does take fluid pills but reports that he did not take any today. He reports that his pain was a 6 out of 10 in intensity but reports now with 0 out of 10. The patient did not take any medicine for his pain prior to coming in. The patient denies any chest pain, shortness of breath, belly pain, sweats, nausea, vomiting.   Past Medical History:  Diagnosis Date  . Aortic atherosclerosis (HCC) 05/2013   Per TEE  . Arrhythmia   . Asthma   . Colon polyp   . Coronary artery disease    a. Nonobstructive by 2004 cath b. Nonobstructive by 2015 cath in setting of NSTEMI  . Diabetes mellitus without complication (HCC)    a. Hgb A1C 5.9% 05/2013  . GERD (gastroesophageal reflux disease)   . Heart murmur   . Hiatal hernia   . Hyperlipidemia   . Hypertension   . Moderate aortic stenosis    a. echo 2013: EF 55%, nl wall motion, mild DD, mild LVH, trace MR, moderate AS with peak gradient of 55 mm Hg; b. TEE 05/2013: EF EF 60-65%, borderline LVH, mild MR/AI, mod AS with valve area 1.0-1.26; c. echo 10/2014: 55-60%, no RWMA, GR1DD, mod AS w/ mean gradient of 28 mm Hg     . Obesity   . Onychomycosis   . Osteoarthritis     Patient Active Problem List   Diagnosis Date Noted  . Chest pain at rest 06/21/2015  . Hard of hearing 03/26/2015  . Leg swelling 03/26/2015  . Morbid obesity (HCC) 03/26/2015  . Preoperative clearance 01/29/2015  . Preop cardiovascular exam 10/17/2014  . Pain in knee 06/29/2013  . Encounter to establish care 06/29/2013  . CAD (coronary artery disease), native coronary artery 06/05/2013  . Hypertension 06/05/2013  . Abnormal EKG 02/23/2012  . Hyperlipidemia 02/23/2012  . Diabetes mellitus (HCC) 02/23/2012  . Aortic valve stenosis 02/23/2012    Past Surgical History:  Procedure Laterality Date  . CARDIAC CATHETERIZATION  11/2002  . CARDIAC CATHETERIZATION  05/21/2013   showing 30% oLM, 30% D1, 20% mLCx, 30% pRCA; EF 60%, severe AS (mean gradient 28 mmHg, peak 26, area 0.9)  . CHOLECYSTECTOMY  2001  . TRANSESOPHAGEAL ECHOCARDIOGRAM  05/22/2013   Preserved EF, moderate AS (valve area by planimetry between 1.0-1.26 cm sq), moderate aortic arch and descending aortic atherosclerosis.   . TRANSTHORACIC ECHOCARDIOGRAM  05/19/2013   EF 60-65%, impaired diastolic function, mild LA dilatation, mild MR/AI/TR, mod AS, high normal RVSP (30.4 mmHg)    Prior to Admission medications   Medication Sig Start Date End Date Taking? Authorizing Provider  atorvastatin (LIPITOR) 40 MG tablet TAKE 1 TABLET  DAILY 11/21/15  Yes Antonieta Iba, MD  clopidogrel (PLAVIX) 75 MG tablet TAKE 1 TABLET DAILY 11/21/15  Yes Antonieta Iba, MD  furosemide (LASIX) 20 MG tablet TAKE 1 TABLET DAILY 11/21/15  Yes Antonieta Iba, MD  KLOR-CON M10 10 MEQ tablet TAKE 1 TABLET DAILY 11/21/15  Yes Antonieta Iba, MD  lisinopril (PRINIVIL,ZESTRIL) 10 MG tablet TAKE 1 TABLET DAILY 11/21/15  Yes Antonieta Iba, MD  metFORMIN (GLUCOPHAGE) 500 MG tablet Take 500 mg by mouth 2 (two) times daily with a meal.   Yes Historical Provider, MD  TOPROL XL 25 MG 24 hr tablet TAKE 1 TABLET DAILY 11/21/15  Yes Antonieta Iba, MD    Allergies Aspirin  Family History    Problem Relation Age of Onset  . Heart disease Mother   . Heart disease Father 70  . Stroke Brother     Social History Social History  Substance Use Topics  . Smoking status: Former Smoker    Packs/Clare: 1.50    Years: 20.00    Types: Cigarettes    Quit date: 02/22/1981  . Smokeless tobacco: Never Used  . Alcohol use No    Review of Systems Constitutional: No fever/chills Eyes: No visual changes. ENT: No sore throat. Cardiovascular: Denies chest pain. Respiratory: Denies shortness of breath. Gastrointestinal: No abdominal pain.  No nausea, no vomiting.  No diarrhea.  No constipation. Genitourinary: Negative for dysuria. Musculoskeletal: Right knee pain, right leg pain Skin: Negative for rash. Neurological: Negative for headaches, focal weakness or numbness. Hematological/Lymphatic:Bilateral lower extremity swelling  10-point ROS otherwise negative.  ____________________________________________   PHYSICAL EXAM:  VITAL SIGNS: ED Triage Vitals  Enc Vitals Group     BP 12/19/15 2016 117/86     Pulse Rate 12/19/15 2016 88     Resp 12/19/15 2016 20     Temp 12/19/15 2016 98.9 F (37.2 C)     Temp Source 12/19/15 2016 Oral     SpO2 12/19/15 2016 94 %     Weight 12/19/15 2016 280 lb (127 kg)     Height 12/19/15 2016 5\' 11"  (1.803 m)     Head Circumference --      Peak Flow --      Pain Score 12/19/15 2017 0     Pain Loc --      Pain Edu? --      Excl. in GC? --     Constitutional: Alert and oriented. Well appearing and in no acute distress. Eyes: Conjunctivae are normal. PERRL. EOMI. Head: Atraumatic. Nose: No congestion/rhinnorhea. Mouth/Throat: Mucous membranes are moist.  Oropharynx non-erythematous. Cardiovascular: Normal rate, regular rhythm. Systolic murmur grade 3/6.Marland Kitchen  Good peripheral circulation. Respiratory: Normal respiratory effort.  No retractions. Lungs CTAB. Gastrointestinal: Soft and nontender. No distention. Positive bowel  sounds Musculoskeletal: Bilateral lower extremity edema right greater than left Neurologic:  Normal speech and language. Skin:  Skin is warm, dry and intact.  Psychiatric: Mood and affect are normal.   ____________________________________________   LABS (all labs ordered are listed, but only abnormal results are displayed)  Labs Reviewed  COMPREHENSIVE METABOLIC PANEL - Abnormal; Notable for the following:       Result Value   Glucose, Bld 122 (*)    Albumin 3.3 (*)    All other components within normal limits  BRAIN NATRIURETIC PEPTIDE   ____________________________________________  EKG  none ____________________________________________  RADIOLOGY  [place] RLE: No evidence of deep venous thrombosis in the right lower extremity. ____________________________________________  PROCEDURES  Procedure(s) performed: None  Procedures  Critical Care performed: No  ____________________________________________   INITIAL IMPRESSION / ASSESSMENT AND PLAN / ED COURSE  Pertinent labs & imaging results that were available during my care of the patient were reviewed by me and considered in my medical decision making (see chart for details).  This is an 82 year old male who comes into the hospital today with some right leg and knee pain. The patient also has some right leg swelling. The patient received an ultrasound which was negative for DVT. The patient also has not been taking his Lasix. I have encouraged him to retake his Lasix and I'll do an x-ray of his right knee. The patient will be reassessed.  Clinical Course  Value Comment By Time  DG Knee Complete 4 Views Right Mild tricompartmental osteoarthritis. No acute bony abnormality.  Mild diffuse soft tissue edema.   Rebecka Apley, MD 08/05 [phone]   As the patient's ultrasound is unremarkable and his x-ray shows some arthritis I feel that the patient's pain is likely due to arthritis. I also feel that the patient's swelling  is due to him missing his Lasix. I will discharge the patient home as his pain is improved. He will follow-up with his primary care physician. The patient has no further complaints or concerns at this time. Rebecka Apley, MD 08/05 [phone]   The patient will be discharged to home.  ____________________________________________   FINAL CLINICAL IMPRESSION(S) / ED DIAGNOSES  Final diagnoses:  Leg swelling  Right knee pain      NEW MEDICATIONS STARTED DURING THIS VISIT:  New Prescriptions   No medications on file     Note:  This document was prepared using Dragon voice recognition software and may include unintentional dictation errors.    Rebecka Apley, MD 12/20/15 Georgiann Mohs

## 2015-12-19 NOTE — ED Triage Notes (Signed)
Pt states has bilateral leg swelling. Complains of more swelling to right leg than left. Pt complains of pain with movement of right leg. Pt denies nausea, shob, chest pain, dizzy. Pt states has been increasingly swollen over last 2 days.

## 2015-12-20 DIAGNOSIS — M25561 Pain in right knee: Secondary | ICD-10-CM | POA: Diagnosis not present

## 2015-12-30 DIAGNOSIS — M1711 Unilateral primary osteoarthritis, right knee: Secondary | ICD-10-CM | POA: Diagnosis not present

## 2016-01-02 ENCOUNTER — Ambulatory Visit: Payer: Medicare Other | Admitting: Family Medicine

## 2016-01-21 ENCOUNTER — Ambulatory Visit (INDEPENDENT_AMBULATORY_CARE_PROVIDER_SITE_OTHER): Payer: Medicare Other | Admitting: Family Medicine

## 2016-01-21 ENCOUNTER — Encounter: Payer: Self-pay | Admitting: Family Medicine

## 2016-01-21 DIAGNOSIS — I350 Nonrheumatic aortic (valve) stenosis: Secondary | ICD-10-CM

## 2016-01-21 DIAGNOSIS — E1159 Type 2 diabetes mellitus with other circulatory complications: Secondary | ICD-10-CM | POA: Diagnosis not present

## 2016-01-21 DIAGNOSIS — E785 Hyperlipidemia, unspecified: Secondary | ICD-10-CM

## 2016-01-21 DIAGNOSIS — Z23 Encounter for immunization: Secondary | ICD-10-CM | POA: Diagnosis not present

## 2016-01-21 DIAGNOSIS — I10 Essential (primary) hypertension: Secondary | ICD-10-CM | POA: Diagnosis not present

## 2016-01-21 DIAGNOSIS — I25119 Atherosclerotic heart disease of native coronary artery with unspecified angina pectoris: Secondary | ICD-10-CM | POA: Diagnosis not present

## 2016-01-21 LAB — LIPID PANEL
Cholesterol: 144 mg/dL (ref 0–200)
HDL: 51.3 mg/dL (ref >39.00)
LDL Cholesterol: 81 mg/dL (ref 0–99)
NonHDL: 92.32
Total CHOL/HDL Ratio: 3
Triglycerides: 58 mg/dL (ref 0.0–149.0)
VLDL: 11.6 mg/dL (ref 0.0–40.0)

## 2016-01-21 LAB — HEMOGLOBIN A1C: HEMOGLOBIN A1C: 5.7 % (ref 4.6–6.5)

## 2016-01-21 NOTE — Assessment & Plan Note (Signed)
Stable. A1C today. Continue metformin. 

## 2016-01-21 NOTE — Assessment & Plan Note (Signed)
Stable.  Continue current medications.

## 2016-01-21 NOTE — Assessment & Plan Note (Signed)
Stable. Per Dr. Mariah MillingGollan, repeat echo in 2018.

## 2016-01-21 NOTE — Progress Notes (Signed)
Subjective:  Patient ID: [name], male    DOB: 18-May-1930  Age: 82 y.o. MRN: [id number]  CC: Follow up  HPI:  82 year old male with DM 2, hyperlipidemia, CAD, hypertension, morbid obesity, aortic valve stenosis presents for follow-up.  DM-2  Stable on metformin.  Needs A1c.  HLD  Well controlled on Lipitor. Is due for lipid panel.  HTN  At goal on lisinopril, Toprol-XL, and Lasix.  CAD  Stable.  No reports of chest pain.  On Plavix, statin, ACE inhibitor and beta blocker.  Aortic stenosis  Moderate to severe.  Followed by cardiology.  Per last note, due for repeat echocardiogram in 2018.  Social Hx   Social History   Social History  . Marital status: Married    Spouse name: N/A  . Number of children: N/A  . Years of education: N/A   Occupational History  . Retired Teacher, [employer]      Career military   Social History Main Topics  . Smoking status: Former Smoker    Packs/[place]: 1.50    Years: 20.00    Types: Cigarettes    Quit date: 02/22/1981  . Smokeless tobacco: Never Used  . Alcohol use No  . Drug use: No  . Sexual activity: Not Asked   Other Topics Concern  . None   Social History Narrative   Mr. Housand lives in [place] with his wife. They have a son and daughter. He is retired from [employer]. He enjoys fishing.    Review of Systems  Respiratory: Negative for shortness of breath.   Cardiovascular: Negative for chest pain.   Objective:  BP 132/63 (BP Location: Left Arm, Patient Position: Sitting, Cuff Size: Large)   Pulse 73   Temp 97.8 F (36.6 C) (Oral)   Wt 284 lb 4 oz (128.9 kg)   SpO2 96%   BMI 39.64 kg/m   BP/Weight 01/21/2016 12/20/2015 12/19/2015  Systolic BP 132 141 -  Diastolic BP 63 53 -  Wt. (Lbs) 284.25 - 280  BMI 39.64 - 39.05   Physical Exam  Constitutional: He is oriented to person, place, and time.  Morbidly obese male in no acute distress.  Cardiovascular: Normal rate and regular rhythm.   3/6  systolic murmur.  2-3+ LE edema.   Pulmonary/Chest: Effort normal. No respiratory distress. He has no wheezes. He has no rales.  Neurological: He is alert and oriented to person, place, and time.  Psychiatric: He has a normal mood and affect.  Vitals reviewed. Diabetic Foot Check -  Appearance - Thick nails noted.  Skin - no unusual pallor or redness Monofilament testing -  Right - Great toe, medial, central, lateral ball and posterior foot intact Left - Great toe, medial, central, lateral ball and posterior foot intact   Lab Results  Component Value Date   WBC 9.6 06/21/2015   HGB 14.0 06/21/2015   HCT 42.3 06/21/2015   PLT 131 (L) 06/21/2015   GLUCOSE 122 (H) 12/19/2015   CHOL 132 01/13/2015   TRIG 72.0 01/13/2015   HDL 44.30 01/13/2015   LDLCALC 73 01/13/2015   ALT 18 12/19/2015   AST 21 12/19/2015   NA 141 12/19/2015   K 3.6 12/19/2015   CL 109 12/19/2015   CREATININE 0.79 12/19/2015   BUN 15 12/19/2015   CO2 26 12/19/2015   INR 1.1 05/18/2013   HGBA1C 5.9 01/13/2015    Assessment & Plan:   Problem List Items Addressed This Visit  Hyperlipidemia    Well controlled. Lipid panel today. Continue Lipitor.      Relevant Orders   Lipid Profile   Diabetes mellitus (HCC)    Stable. A1C today. Continue metformin.      Relevant Orders   HgB A1c   Aortic valve stenosis    Stable. Per Dr. Mariah Milling, repeat echo in 2018.       CAD (coronary artery disease), native coronary artery    Stable.  Continue current medications.      Hypertension    Stable.  Continue Toprol, Lisinopril, Lasix.       Other Visit Diagnoses   None.     Follow-up: Return in about 3 months (around 04/21/2016).  Everlene Other DO [facility]

## 2016-01-21 NOTE — Progress Notes (Signed)
Pre visit review using our clinic review tool, if applicable. No additional management support is needed unless otherwise documented below in the visit note. 

## 2016-01-21 NOTE — Patient Instructions (Signed)
Continue your current medications.  We will call it your podiatry referral.  Follow up in 3 months.  Take care  Dr. Adriana Simasook

## 2016-01-21 NOTE — Assessment & Plan Note (Signed)
Well controlled. Lipid panel today. Continue Lipitor. 

## 2016-01-21 NOTE — Assessment & Plan Note (Signed)
Stable.  Continue Toprol, Lisinopril, Lasix.

## 2016-02-04 ENCOUNTER — Ambulatory Visit: Payer: Medicare Other | Admitting: Family

## 2016-02-04 DIAGNOSIS — Z0289 Encounter for other administrative examinations: Secondary | ICD-10-CM

## 2016-02-06 ENCOUNTER — Ambulatory Visit (INDEPENDENT_AMBULATORY_CARE_PROVIDER_SITE_OTHER): Payer: Medicare Other | Admitting: Cardiovascular Disease

## 2016-02-06 ENCOUNTER — Encounter: Payer: Self-pay | Admitting: Cardiovascular Disease

## 2016-02-06 VITALS — BP 132/54 | HR 83 | Resp 22 | Ht 71.0 in | Wt 284.2 lb

## 2016-02-06 DIAGNOSIS — E1159 Type 2 diabetes mellitus with other circulatory complications: Secondary | ICD-10-CM

## 2016-02-06 DIAGNOSIS — I10 Essential (primary) hypertension: Secondary | ICD-10-CM

## 2016-02-06 DIAGNOSIS — I25119 Atherosclerotic heart disease of native coronary artery with unspecified angina pectoris: Secondary | ICD-10-CM

## 2016-02-06 DIAGNOSIS — M7989 Other specified soft tissue disorders: Secondary | ICD-10-CM

## 2016-02-06 DIAGNOSIS — E785 Hyperlipidemia, unspecified: Secondary | ICD-10-CM | POA: Diagnosis not present

## 2016-02-06 DIAGNOSIS — I209 Angina pectoris, unspecified: Secondary | ICD-10-CM | POA: Diagnosis not present

## 2016-02-06 DIAGNOSIS — I350 Nonrheumatic aortic (valve) stenosis: Secondary | ICD-10-CM

## 2016-02-06 NOTE — Progress Notes (Signed)
Cardiology Office Note  Date:  02/06/2016   ID:  [name], DOB 1931-04-12, MRN [id number]  PCP:  Carollee Leitzarrie M Doss, NP   Chief Complaint  Patient presents with  . Other    FU 6 months. No new problems.     HPI:  [name] is a very pleasant 82 year old gentleman with moderate to severe aortic valve stenosis, obesity, diabetes, smoking history for 30 years,  mild coronary artery disease by cardiac catheterization in 2004,  normal ejection fraction with normal wall motion, minimal carotid arterial disease who presents for routine follow-up of his aortic valve stenosis Stop smoking 30 years ago chronic lower extremity swelling worse on the right and left, previous injury to the right lower extremity, previous ultrasound showing no DVT In the past has not wanted to wear compression hose  In follow-up today, biggest complaints are bilateral Knee arthritis,  Recently seen in the ER 12/2015: knee pain Set up appt ortho  Reports having worsening leg swelling Continues to take Lasix daily, denies any change in his breathing Has chronic mild shortness of breath, nothing significant Weight continues to run high though stable No weight gain in the past year He reports having some shortness of breath when he climbs stairs Feels well when he is walking on flat surfaces Denies any chest pain on exertion  Denies having any significant change in palpitations No regular exercise program Chronic knee pain, walks with a cane  EKG on today's visit shows normal sinus rhythm with right bundle branch block, rate 59 bpm, no change from prior EKGs  Other past medical history reviewed Echocardiogram in July 2016  confirming moderate aortic valve stenosis  wife  is in a nursing home, had a bilateral stroke.  Has chronic right lower extremity swelling, previous trauma to the right leg. Does not wear a compression hose  Lower extremity venous Doppler reviewed with him from 05/08/2015 showing no DVT on the  right   total cholesterol 132, LDL 73, hemoglobin A1c 5.9  Cardiac catheterization July 2004 showed 20% disease in the LAD, RCA and left circumflex Stress test in 2012 shows no significant ischemia, attenuation artifact in the septal wall  Vitamin D 13, hematocrit 42, creatinine 0.77, total cholesterol 157, HDL 54, LDL 89   PMH:   has a past medical history of Aortic atherosclerosis (HCC) (05/2013); Arrhythmia; Asthma; Colon polyp; Coronary artery disease; Diabetes mellitus without complication (HCC); GERD (gastroesophageal reflux disease); Heart murmur; Hiatal hernia; Hyperlipidemia; Hypertension; Moderate aortic stenosis; Obesity; Onychomycosis; and Osteoarthritis.  PSH:    Past Surgical History:  Procedure Laterality Date  . CARDIAC CATHETERIZATION  11/2002  . CARDIAC CATHETERIZATION  05/21/2013   showing 30% oLM, 30% D1, 20% mLCx, 30% pRCA; EF 60%, severe AS (mean gradient 28 mmHg, peak 26, area 0.9)  . CHOLECYSTECTOMY  2001  . TRANSESOPHAGEAL ECHOCARDIOGRAM  05/22/2013   Preserved EF, moderate AS (valve area by planimetry between 1.0-1.26 cm sq), moderate aortic arch and descending aortic atherosclerosis.   . TRANSTHORACIC ECHOCARDIOGRAM  05/19/2013   EF 60-65%, impaired diastolic function, mild LA dilatation, mild MR/AI/TR, mod AS, high normal RVSP (30.4 mmHg)    Current Outpatient Prescriptions  Medication Sig Dispense Refill  . atorvastatin (LIPITOR) 40 MG tablet TAKE 1 TABLET DAILY 90 tablet 3  . clopidogrel (PLAVIX) 75 MG tablet TAKE 1 TABLET DAILY 90 tablet 3  . furosemide (LASIX) 20 MG tablet TAKE 1 TABLET DAILY 90 tablet 3  . KLOR-CON M10 10 MEQ tablet TAKE 1 TABLET  DAILY 90 tablet 3  . lisinopril (PRINIVIL,ZESTRIL) 10 MG tablet TAKE 1 TABLET DAILY 90 tablet 3  . metFORMIN (GLUCOPHAGE) 500 MG tablet Take 500 mg by mouth 2 (two) times daily with a meal.    . TOPROL XL 25 MG 24 hr tablet TAKE 1 TABLET DAILY 90 tablet 3   No current facility-administered medications for this  visit.      Allergies:   Aspirin   Social History:  The patient  reports that he quit smoking about 34 years ago. His smoking use included Cigarettes. He has a 30.00 pack-year smoking history. He has never used smokeless tobacco. He reports that he does not drink alcohol or use drugs.   Family History:   family history includes Heart disease in his mother; Heart disease (age of onset: 90) in his father; Stroke in his brother.    Review of Systems: Review of Systems  Constitutional: Negative.   Respiratory: Positive for shortness of breath.   Cardiovascular: Positive for leg swelling.  Gastrointestinal: Negative.   Musculoskeletal: Positive for joint pain.  Neurological: Negative.   Psychiatric/Behavioral: Negative.   All other systems reviewed and are negative.    PHYSICAL EXAM: VS:  BP (!) 132/54 (BP Location: Left Arm, Patient Position: Sitting, Cuff Size: Normal)   Pulse 83   Resp (!) 22   Ht 5\' 11"  (1.803 m)   Wt 284 lb 4 oz (128.9 kg)   BMI 39.64 kg/m  , BMI Body mass index is 39.64 kg/m. GEN: Well nourished, well developed, in no acute distress , obese HEENT: normal  Neck: no JVD, carotid bruits, or masses Cardiac: RRR; 3+ SEM RSB, no rubs, or gallops, significant nonpitting edema bilaterally, right leg greater than left  Respiratory:  clear to auscultation bilaterally, normal work of breathing GI: soft, nontender, nondistended, + BS MS: no deformity or atrophy  Skin: warm and dry, no rash Neuro:  Strength and sensation are intact Psych: euthymic mood, full affect    Recent Labs: 03/26/2015: Pro B Natriuretic peptide (BNP) 42.0 06/21/2015: Hemoglobin 14.0; Platelets 131 12/19/2015: ALT 18; B Natriuretic Peptide 48.0; BUN 15; Creatinine, Ser 0.79; Potassium 3.6; Sodium 141    Lipid Panel Lab Results  Component Value Date   CHOL 144 01/21/2016   HDL 51.30 01/21/2016   LDLCALC 81 01/21/2016   TRIG 58.0 01/21/2016      Wt Readings from Last 3 Encounters:   02/06/16 284 lb 4 oz (128.9 kg)  01/21/16 284 lb 4 oz (128.9 kg)  12/19/15 280 lb (127 kg)    Weight in 07/2016: 283   ASSESSMENT AND PLAN:  Hyperlipidemia Cholesterol is at goal on the current lipid regimen. No changes to the medications were made.  Aortic valve stenosis Moderate to severe aortic valve stenosis, will repeat echocardiogram in 2018 We did discuss various treatment options for his aortic valve when this does become severe We did spend some time talking about TAVR, if he is a candidate at the time  Coronary artery disease involving native coronary artery of native heart with angina pectoris (HCC) Currently with no symptoms of angina. No further workup at this time. Continue current medication regimen.  Essential hypertension Blood pressure is well controlled on today's visit. No changes made to the medications.  Type 2 diabetes mellitus with other circulatory complication (HCC) We have encouraged continued exercise, careful diet management in an effort to lose weight.  Morbid obesity due to excess calories (HCC) Stressed importance of weight loss he has  been told total knee replacement cannot be performed until he loses weight   Leg swelling  leg swelling likely secondary to venous insufficiency, worse on the right than the left . Recommended he be fitted for compression hose . Friend with him today states that he knows the compression hose factory in [place] and can go there to be fitted    Total encounter time more than 25 minutes  Greater than 50% was spent in counseling and coordination of care with the patient   Disposition:   F/U  6 months  No orders of the defined types were placed in this encounter.    Signed, Dossie Arbour, M.D., Ph.D. 02/06/2016  [facility] [place], [place] [phone]

## 2016-02-06 NOTE — Patient Instructions (Signed)
Medication Instructions:   No medication changes made  Please wear compression hose for leg swelling Ok to take extra furosemide/lasix as needed   Labwork:  No new labs needed  Testing/Procedures:  No further testing at this time   Follow-Up: It was a pleasure seeing you in the office today. Please call us if you have new issues that need to be addressed before your next appt.  [phone]  Your physician wants you to follow-up in: 6 months.  You will receive a reminder letter in the mail two months in advance. If you don't receive a letter, please call our office to schedule the follow-up appointment.  If you need a refill on your cardiac medications before your next appointment, please call your pharmacy.

## 2016-02-06 NOTE — Addendum Note (Signed)
Addended by: Ashayla Subia R on: 02/06/2016 03:43 PM   Modules accepted: Orders  

## 2016-02-11 ENCOUNTER — Other Ambulatory Visit: Payer: Medicare Other

## 2016-03-03 ENCOUNTER — Other Ambulatory Visit: Payer: Self-pay

## 2016-03-03 ENCOUNTER — Ambulatory Visit (INDEPENDENT_AMBULATORY_CARE_PROVIDER_SITE_OTHER): Payer: Medicare Other

## 2016-03-03 DIAGNOSIS — I350 Nonrheumatic aortic (valve) stenosis: Secondary | ICD-10-CM | POA: Diagnosis not present

## 2016-04-14 ENCOUNTER — Emergency Department
Admission: EM | Admit: 2016-04-14 | Discharge: 2016-04-14 | Disposition: A | Discharge status: Home / Self Care | Payer: Medicare Other | Attending: Emergency Medicine | Admitting: Emergency Medicine

## 2016-04-14 ENCOUNTER — Emergency Department: Payer: Medicare Other

## 2016-04-14 DIAGNOSIS — M7989 Other specified soft tissue disorders: Secondary | ICD-10-CM | POA: Diagnosis not present

## 2016-04-14 DIAGNOSIS — Z7984 Long term (current) use of oral hypoglycemic drugs: Secondary | ICD-10-CM | POA: Insufficient documentation

## 2016-04-14 DIAGNOSIS — Z87891 Personal history of nicotine dependence: Secondary | ICD-10-CM | POA: Insufficient documentation

## 2016-04-14 DIAGNOSIS — I110 Hypertensive heart disease with heart failure: Secondary | ICD-10-CM | POA: Diagnosis not present

## 2016-04-14 DIAGNOSIS — I509 Heart failure, unspecified: Secondary | ICD-10-CM | POA: Insufficient documentation

## 2016-04-14 DIAGNOSIS — J45909 Unspecified asthma, uncomplicated: Secondary | ICD-10-CM | POA: Diagnosis not present

## 2016-04-14 DIAGNOSIS — I2510 Atherosclerotic heart disease of native coronary artery without angina pectoris: Secondary | ICD-10-CM | POA: Diagnosis not present

## 2016-04-14 DIAGNOSIS — Z79899 Other long term (current) drug therapy: Secondary | ICD-10-CM | POA: Insufficient documentation

## 2016-04-14 DIAGNOSIS — M79605 Pain in left leg: Secondary | ICD-10-CM | POA: Diagnosis present

## 2016-04-14 DIAGNOSIS — R600 Localized edema: Secondary | ICD-10-CM | POA: Diagnosis not present

## 2016-04-14 DIAGNOSIS — E119 Type 2 diabetes mellitus without complications: Secondary | ICD-10-CM | POA: Diagnosis not present

## 2016-04-14 LAB — BASIC METABOLIC PANEL
ANION GAP: 6 (ref 5–15)
BUN: 16 mg/dL (ref 6–20)
CALCIUM: 9.1 mg/dL (ref 8.9–10.3)
CO2: 28 mmol/L (ref 22–32)
Chloride: 109 mmol/L (ref 101–111)
Creatinine, Ser: 0.78 mg/dL (ref 0.61–1.24)
GFR calc non Af Amer: >60 mL/min (ref >60)
Glucose, Bld: 112 mg/dL — ABNORMAL HIGH (ref 65–99)
Potassium: 3.8 mmol/L (ref 3.5–5.1)
Sodium: 143 mmol/L (ref 135–145)

## 2016-04-14 LAB — CBC
HEMATOCRIT: 38.2 % — AB (ref 40.0–52.0)
Hemoglobin: 12.6 g/dL — ABNORMAL LOW (ref 13.0–18.0)
MCH: 26.5 pg (ref 26.0–34.0)
MCHC: 32.9 g/dL (ref 32.0–36.0)
MCV: 80.6 fL (ref 80.0–100.0)
PLATELETS: 193 10*3/uL (ref 150–440)
RBC: 4.74 MIL/uL (ref 4.40–5.90)
RDW: 15.2 % — AB (ref 11.5–14.5)
WBC: 6.2 10*3/uL (ref 3.8–10.6)

## 2016-04-14 LAB — TROPONIN I: Troponin I: <0.03 ng/mL (ref <0.03)

## 2016-04-14 LAB — BRAIN NATRIURETIC PEPTIDE: B Natriuretic Peptide: 46 pg/mL (ref 0.0–100.0)

## 2016-04-14 MED ORDER — FUROSEMIDE 40 MG PO TABS
40.0000 mg | ORAL_TABLET | Freq: Once | ORAL | Status: AC
  Administered 2016-04-14: 40 mg via ORAL
  Filled 2016-04-14: qty 1

## 2016-04-14 NOTE — Discharge Instructions (Signed)
Start taking Lasix (fluid pill) again. Call your primary care and advise them that you stopped your medication and had increasing lower extremity edema.

## 2016-04-14 NOTE — ED Provider Notes (Signed)
[facility] Emergency Department Provider Note  ____________________________________________  Time seen: Approximately 6:21 PM  I have reviewed the triage vital signs and the nursing notes.   HISTORY  Chief Complaint Leg Pain    HPI [name] is a 82 y.o. male with a medical history of arrhythmia, coronary artery disease, diabetes, hypertension, aortic stenosis, obesity, mild CHF, presents to emergency department complaining of bilateral lower extremity edema and pain. Patient states that hetakes a "fluid pill" on a daily basis but states that for the past 2-3 days he is skipped this medication. Patient states that he stopped it because "I felt like I was peeing too much." After stopping the medication, patient has experienced bilateral lower extremity edema. Patient states that he will have some pain when he goes from a sitting to a standing position but as he ambulates the pain disappears. Patient denies any pain at baseline. Patient denies any chest pain, shortness of breath, abdominal pain, nausea or vomiting. No other complaints at this time.   Past Medical History:  Diagnosis Date  . Aortic atherosclerosis (HCC) 05/2013   Per TEE  . Arrhythmia   . Asthma   . Colon polyp   . Coronary artery disease    a. Nonobstructive by 2004 cath b. Nonobstructive by 2015 cath in setting of NSTEMI  . Diabetes mellitus without complication (HCC)    a. Hgb A1C 5.9% 05/2013  . GERD (gastroesophageal reflux disease)   . Heart murmur   . Hiatal hernia   . Hyperlipidemia   . Hypertension   . Moderate aortic stenosis    a. echo 2013: EF 55%, nl wall motion, mild DD, mild LVH, trace MR, moderate AS with peak gradient of 55 mm Hg; b. TEE 05/2013: EF EF 60-65%, borderline LVH, mild MR/AI, mod AS with valve area 1.0-1.26; c. echo 10/2014: 55-60%, no RWMA, GR1DD, mod AS w/ mean gradient of 28 mm Hg     . Obesity   . Onychomycosis   . Osteoarthritis     Patient Active Problem  List   Diagnosis Date Noted  . Leg swelling 03/26/2015  . Morbid obesity (HCC) 03/26/2015  . CAD (coronary artery disease), native coronary artery 06/05/2013  . Hypertension 06/05/2013  . Hyperlipidemia 02/23/2012  . Diabetes mellitus (HCC) 02/23/2012  . Aortic valve stenosis 02/23/2012    Past Surgical History:  Procedure Laterality Date  . CARDIAC CATHETERIZATION  11/2002  . CARDIAC CATHETERIZATION  05/21/2013   showing 30% oLM, 30% D1, 20% mLCx, 30% pRCA; EF 60%, severe AS (mean gradient 28 mmHg, peak 26, area 0.9)  . CHOLECYSTECTOMY  2001  . TRANSESOPHAGEAL ECHOCARDIOGRAM  05/22/2013   Preserved EF, moderate AS (valve area by planimetry between 1.0-1.26 cm sq), moderate aortic arch and descending aortic atherosclerosis.   . TRANSTHORACIC ECHOCARDIOGRAM  05/19/2013   EF 60-65%, impaired diastolic function, mild LA dilatation, mild MR/AI/TR, mod AS, high normal RVSP (30.4 mmHg)    Prior to Admission medications   Medication Sig Start Date End Date Taking? Authorizing Provider  atorvastatin (LIPITOR) 40 MG tablet TAKE 1 TABLET DAILY 11/21/15   Antonieta Iba, MD  clopidogrel (PLAVIX) 75 MG tablet TAKE 1 TABLET DAILY 11/21/15   Antonieta Iba, MD  furosemide (LASIX) 20 MG tablet TAKE 1 TABLET DAILY 11/21/15   Antonieta Iba, MD  KLOR-CON M10 10 MEQ tablet TAKE 1 TABLET DAILY 11/21/15   Antonieta Iba, MD  lisinopril (PRINIVIL,ZESTRIL) 10 MG tablet TAKE 1 TABLET DAILY 11/21/15  Antonieta Ibaimothy J Gollan, MD  metFORMIN (GLUCOPHAGE) 500 MG tablet Take 500 mg by mouth 2 (two) times daily with a meal.    Historical Provider, MD  TOPROL XL 25 MG 24 hr tablet TAKE 1 TABLET DAILY 11/21/15   Antonieta Ibaimothy J Gollan, MD    Allergies Aspirin  Family History  Problem Relation Age of Onset  . Heart disease Mother   . Heart disease Father [id number]  . Stroke Brother     Social History Social History  Substance Use Topics  . Smoking status: Former Smoker    Packs/[place]: 1.50    Years: 20.00    Types: Cigarettes     Quit date: 02/22/1981  . Smokeless tobacco: Never Used  . Alcohol use No     Review of Systems  Constitutional: No fever/chills Eyes: No visual changes.  ENT: No upper respiratory complaints. Cardiovascular: no chest pain. Respiratory: no cough. No SOB. Gastrointestinal: No abdominal pain.  No nausea, no vomiting.  No diarrhea.  No constipation. Genitourinary: Negative for dysuria. No hematuria Musculoskeletal: Negative for musculoskeletal pain.Positive for bilateral lower extremity edema. Skin: Negative for rash, abrasions, lacerations, ecchymosis. Neurological: Negative for headaches, focal weakness or numbness. 10-point ROS otherwise negative.  ____________________________________________   PHYSICAL EXAM:  VITAL SIGNS: ED Triage Vitals [04/14/16 1726]  Enc Vitals Group     BP (!) 150/51     Pulse Rate 79     Resp 18     Temp 98.2 F (36.8 C)     Temp Source Oral     SpO2 99 %     Weight 284 lb (128.8 kg)     Height      Head Circumference      Peak Flow      Pain Score      Pain Loc      Pain Edu?      Excl. in GC?      Constitutional: Alert and oriented. Well appearing and in no acute distress.She is morbidly obese. Eyes: Conjunctivae are normal. PERRL. EOMI. Head: Atraumatic. Neck: No stridor.    Cardiovascular: Normal rate, regular rhythm. Normal S1 and S2.  Good peripheral circulation. Respiratory: Normal respiratory effort without tachypnea or retractions. Lungs CTAB. Good air entry to the bases with no decreased or absent breath sounds. Gastrointestinal: Bowel sounds 4 quadrants. Soft and nontender to palpation. No guarding or rigidity. No palpable masses. No distention. Musculoskeletal: Full range of motion to all extremities. No gross deformities appreciated. Patient does have lower extremity edema that is 2+ and nonpitting. Areas are nontender to palpation. Patient has equal sensation bilateral lower extremities. Dorsalis pedis pulse is appreciated  bilaterally lower extremity. Neurologic:  Normal speech and language. No gross focal neurologic deficits are appreciated.  Skin:  Skin is warm, dry and intact. No rash noted. Psychiatric: Mood and affect are normal. Speech and behavior are normal. Patient exhibits appropriate insight and judgement.   ____________________________________________   LABS (all labs ordered are listed, but only abnormal results are displayed)  Labs Reviewed  BASIC METABOLIC PANEL - Abnormal; Notable for the following:       Result Value   Glucose, Bld 112 (*)    All other components within normal limits  CBC - Abnormal; Notable for the following:    Hemoglobin 12.6 (*)    HCT 38.2 (*)    RDW 15.2 (*)    All other components within normal limits  TROPONIN I  BRAIN NATRIURETIC PEPTIDE   ____________________________________________  EKG  EKG reveals normal sinus rhythm at a rate of 72 bpm. Inverted P-wave in V1 consistent with left atrial enlargement. QRS changes in V1 and V2 consistent with right bundle branch block. No ST elevation or depression noted. PR, QRS, QT interval within normal limits. No Q waves are delta waves identified. ____________________________________________  RADIOLOGY   Dg Chest 2 View  Result Date: 04/14/2016 CLINICAL DATA:  Peripheral swelling EXAM: CHEST  2 VIEW COMPARISON:  06/21/2015 FINDINGS: Cardiac shadow is stable. The lungs are well aerated bilaterally. No focal infiltrate or sizable effusion is seen. IMPRESSION: No active cardiopulmonary disease. Electronically Signed   By: Alcide CleverMark  Lukens M.D.   On: 04/14/2016 18:47    ____________________________________________    PROCEDURES  Procedure(s) performed:    Procedures    Medications  furosemide (LASIX) tablet 40 mg (40 mg Oral Given 04/14/16 1913)     ____________________________________________   INITIAL IMPRESSION / ASSESSMENT AND PLAN / ED COURSE  Pertinent labs & imaging results that were  available during my care of the patient were reviewed by me and considered in my medical decision making (see chart for details).  Review of the [place] CSRS was performed in accordance of the [facility] prior to dispensing any controlled drugs.  Clinical Course     Patient's diagnosis is consistent with Bilateral lower extremity edema status post stopping his Lasix. Patient states that he stopped Lasix because he felt that he was urinating too frequently. After patient stopped this medication, he began to experience lower extremity edema bilateral lower extremities. At this time, exam is reassuring. Labs and imaging returned reassuring results. BNP is unremarkable. At this time, it is felt that this is likely complication from stopping diuretic. It is felt that this is much less likely DVT as this is bilateral and consistent with edema from stopping Lasix. At this time, ultrasound is not ordered.Marland Kitchen. She was advised to continue his Lasix at home and is given a loading dose of 40 mg here in the emergency department. Patient is to follow up with primary care provider as needed or otherwise directed. Patient is given ED precautions to return to the ED for any worsening or new symptoms.     ____________________________________________  FINAL CLINICAL IMPRESSION(S) / ED DIAGNOSES  Final diagnoses:  Bilateral lower extremity edema      NEW MEDICATIONS STARTED DURING THIS VISIT:  New Prescriptions   No medications on file        This chart was dictated using voice recognition software/Dragon. Despite best efforts to proofread, errors can occur which can change the meaning. Any change was purely unintentional.    Racheal PatchesJonathan D Alfrieda Tarry, PA-C 04/14/16 1925    Delorise RoyalsJonathan D Elynore Dolinski, PA-C 04/14/16 1929    Charlynne Panderavid Hsienta Yao, MD 04/14/16 2325

## 2016-04-14 NOTE — ED Triage Notes (Addendum)
Right and left leg pain and swelling X 3 days. Pt takes fluid pill, did not take it for past 2 days because it hurt him to walk. Hx of similar. Pt alert and oriented X4, active, cooperative, pt in NAD. RR even and unlabored, color WNL.    Denies SOB.

## 2016-05-13 ENCOUNTER — Observation Stay: Payer: Medicare Other

## 2016-05-13 ENCOUNTER — Encounter: Payer: Self-pay | Admitting: Emergency Medicine

## 2016-05-13 ENCOUNTER — Observation Stay
Admission: EM | Admit: 2016-05-13 | Discharge: 2016-05-13 | Disposition: A | Discharge status: Home / Self Care | Payer: Medicare Other | Attending: Internal Medicine | Admitting: Internal Medicine

## 2016-05-13 ENCOUNTER — Emergency Department: Payer: Medicare Other

## 2016-05-13 ENCOUNTER — Telehealth: Payer: Self-pay | Admitting: Cardiovascular Disease

## 2016-05-13 DIAGNOSIS — N281 Cyst of kidney, acquired: Secondary | ICD-10-CM | POA: Diagnosis not present

## 2016-05-13 DIAGNOSIS — Z886 Allergy status to analgesic agent status: Secondary | ICD-10-CM | POA: Diagnosis not present

## 2016-05-13 DIAGNOSIS — Z7984 Long term (current) use of oral hypoglycemic drugs: Secondary | ICD-10-CM | POA: Insufficient documentation

## 2016-05-13 DIAGNOSIS — Z6838 Body mass index (BMI) 38.0-38.9, adult: Secondary | ICD-10-CM | POA: Diagnosis not present

## 2016-05-13 DIAGNOSIS — E1159 Type 2 diabetes mellitus with other circulatory complications: Secondary | ICD-10-CM | POA: Diagnosis not present

## 2016-05-13 DIAGNOSIS — I10 Essential (primary) hypertension: Secondary | ICD-10-CM | POA: Diagnosis not present

## 2016-05-13 DIAGNOSIS — R0781 Pleurodynia: Secondary | ICD-10-CM | POA: Diagnosis not present

## 2016-05-13 DIAGNOSIS — I350 Nonrheumatic aortic (valve) stenosis: Secondary | ICD-10-CM | POA: Diagnosis not present

## 2016-05-13 DIAGNOSIS — I110 Hypertensive heart disease with heart failure: Secondary | ICD-10-CM | POA: Insufficient documentation

## 2016-05-13 DIAGNOSIS — R079 Chest pain, unspecified: Secondary | ICD-10-CM | POA: Diagnosis not present

## 2016-05-13 DIAGNOSIS — I2510 Atherosclerotic heart disease of native coronary artery without angina pectoris: Secondary | ICD-10-CM | POA: Diagnosis not present

## 2016-05-13 DIAGNOSIS — E785 Hyperlipidemia, unspecified: Secondary | ICD-10-CM | POA: Diagnosis present

## 2016-05-13 DIAGNOSIS — Z8673 Personal history of transient ischemic attack (TIA), and cerebral infarction without residual deficits: Secondary | ICD-10-CM | POA: Diagnosis not present

## 2016-05-13 DIAGNOSIS — K219 Gastro-esophageal reflux disease without esophagitis: Secondary | ICD-10-CM | POA: Insufficient documentation

## 2016-05-13 DIAGNOSIS — M7989 Other specified soft tissue disorders: Secondary | ICD-10-CM | POA: Diagnosis present

## 2016-05-13 DIAGNOSIS — J45909 Unspecified asthma, uncomplicated: Secondary | ICD-10-CM | POA: Insufficient documentation

## 2016-05-13 DIAGNOSIS — Z87891 Personal history of nicotine dependence: Secondary | ICD-10-CM | POA: Diagnosis not present

## 2016-05-13 DIAGNOSIS — M1990 Unspecified osteoarthritis, unspecified site: Secondary | ICD-10-CM | POA: Insufficient documentation

## 2016-05-13 DIAGNOSIS — I509 Heart failure, unspecified: Secondary | ICD-10-CM | POA: Diagnosis not present

## 2016-05-13 DIAGNOSIS — Z86010 Personal history of colonic polyps: Secondary | ICD-10-CM | POA: Insufficient documentation

## 2016-05-13 DIAGNOSIS — Z7902 Long term (current) use of antithrombotics/antiplatelets: Secondary | ICD-10-CM | POA: Insufficient documentation

## 2016-05-13 DIAGNOSIS — I252 Old myocardial infarction: Secondary | ICD-10-CM | POA: Insufficient documentation

## 2016-05-13 DIAGNOSIS — R0602 Shortness of breath: Secondary | ICD-10-CM | POA: Diagnosis not present

## 2016-05-13 DIAGNOSIS — R011 Cardiac murmur, unspecified: Secondary | ICD-10-CM | POA: Diagnosis not present

## 2016-05-13 DIAGNOSIS — R000 Tachycardia, unspecified: Secondary | ICD-10-CM | POA: Insufficient documentation

## 2016-05-13 DIAGNOSIS — E119 Type 2 diabetes mellitus without complications: Secondary | ICD-10-CM | POA: Diagnosis not present

## 2016-05-13 DIAGNOSIS — I4510 Unspecified right bundle-branch block: Secondary | ICD-10-CM | POA: Insufficient documentation

## 2016-05-13 DIAGNOSIS — R7989 Other specified abnormal findings of blood chemistry: Secondary | ICD-10-CM

## 2016-05-13 DIAGNOSIS — I700 Atherosclerosis of aorta: Secondary | ICD-10-CM | POA: Insufficient documentation

## 2016-05-13 LAB — TROPONIN I
Troponin I: <0.03 ng/mL (ref <0.03)
Troponin I: <0.03 ng/mL (ref <0.03)
Troponin I: <0.03 ng/mL (ref <0.03)

## 2016-05-13 LAB — BASIC METABOLIC PANEL
ANION GAP: 6 (ref 5–15)
BUN: 12 mg/dL (ref 6–20)
CHLORIDE: 103 mmol/L (ref 101–111)
CO2: 31 mmol/L (ref 22–32)
CREATININE: 0.93 mg/dL (ref 0.61–1.24)
Calcium: 9.3 mg/dL (ref 8.9–10.3)
GFR calc non Af Amer: >60 mL/min (ref >60)
Glucose, Bld: 152 mg/dL — ABNORMAL HIGH (ref 65–99)
Potassium: 3.7 mmol/L (ref 3.5–5.1)
Sodium: 140 mmol/L (ref 135–145)

## 2016-05-13 LAB — CBC
HEMATOCRIT: 39.8 % — AB (ref 40.0–52.0)
HEMOGLOBIN: 13 g/dL (ref 13.0–18.0)
MCH: 26 pg (ref 26.0–34.0)
MCHC: 32.7 g/dL (ref 32.0–36.0)
MCV: 79.5 fL — AB (ref 80.0–100.0)
Platelets: 194 10*3/uL (ref 150–440)
RBC: 5.01 MIL/uL (ref 4.40–5.90)
RDW: 15 % — ABNORMAL HIGH (ref 11.5–14.5)
WBC: 10.1 10*3/uL (ref 3.8–10.6)

## 2016-05-13 LAB — GLUCOSE, CAPILLARY
Glucose-Capillary: 128 mg/dL — ABNORMAL HIGH (ref 65–99)
Glucose-Capillary: 109 mg/dL — ABNORMAL HIGH (ref 65–99)
Glucose-Capillary: 100 mg/dL — ABNORMAL HIGH (ref 65–99)

## 2016-05-13 LAB — BRAIN NATRIURETIC PEPTIDE: B Natriuretic Peptide: 32 pg/mL (ref 0.0–100.0)

## 2016-05-13 LAB — FIBRIN DERIVATIVES D-DIMER (ARMC ONLY): Fibrin derivatives D-dimer (ARMC): 1801 — ABNORMAL HIGH (ref 0–499)

## 2016-05-13 LAB — TSH: TSH: 0.865 u[IU]/mL (ref 0.350–4.500)

## 2016-05-13 MED ORDER — ACETAMINOPHEN 325 MG PO TABS: 650.0000 mg | ORAL_TABLET | Freq: Four times a day (QID) | ORAL | Status: DC | PRN

## 2016-05-13 MED ORDER — LISINOPRIL 10 MG PO TABS
10.0000 mg | ORAL_TABLET | Freq: Every day | ORAL | Status: DC
  Administered 2016-05-13: 10 mg via ORAL
  Filled 2016-05-13: qty 1

## 2016-05-13 MED ORDER — MORPHINE SULFATE (PF) 2 MG/ML IV SOLN
INTRAVENOUS | Status: AC
  Filled 2016-05-13: qty 1

## 2016-05-13 MED ORDER — DOCUSATE SODIUM 100 MG PO CAPS
100.0000 mg | ORAL_CAPSULE | Freq: Two times a day (BID) | ORAL | Status: DC
  Administered 2016-05-13: 100 mg via ORAL
  Filled 2016-05-13: qty 1

## 2016-05-13 MED ORDER — TORSEMIDE 20 MG PO TABS: 20.0000 mg | ORAL_TABLET | Freq: Every day | ORAL | Status: DC

## 2016-05-13 MED ORDER — METOPROLOL SUCCINATE ER 25 MG PO TB24
25.0000 mg | ORAL_TABLET | Freq: Every day | ORAL | Status: DC
  Administered 2016-05-13: 25 mg via ORAL
  Filled 2016-05-13: qty 1

## 2016-05-13 MED ORDER — FUROSEMIDE 10 MG/ML IJ SOLN
60.0000 mg | Freq: Once | INTRAMUSCULAR | Status: AC
Start: 2016-05-13 — End: 2016-05-13
  Administered 2016-05-13: 60 mg via INTRAVENOUS
  Filled 2016-05-13: qty 8

## 2016-05-13 MED ORDER — CLOPIDOGREL BISULFATE 75 MG PO TABS
75.0000 mg | ORAL_TABLET | Freq: Every day | ORAL | Status: DC
  Administered 2016-05-13: 75 mg via ORAL
  Filled 2016-05-13: qty 1

## 2016-05-13 MED ORDER — ATORVASTATIN CALCIUM 20 MG PO TABS
40.0000 mg | ORAL_TABLET | Freq: Every day | ORAL | Status: DC
  Administered 2016-05-13: 40 mg via ORAL
  Filled 2016-05-13: qty 2

## 2016-05-13 MED ORDER — ONDANSETRON HCL 4 MG/2ML IJ SOLN: 4.0000 mg | Freq: Four times a day (QID) | INTRAMUSCULAR | Status: DC | PRN

## 2016-05-13 MED ORDER — ACETAMINOPHEN 650 MG RE SUPP: 650.0000 mg | Freq: Four times a day (QID) | RECTAL | Status: DC | PRN

## 2016-05-13 MED ORDER — INSULIN ASPART 100 UNIT/ML ~~LOC~~ SOLN: 0.0000 [IU] | Freq: Three times a day (TID) | SUBCUTANEOUS | Status: DC

## 2016-05-13 MED ORDER — ONDANSETRON HCL 4 MG PO TABS: 4.0000 mg | ORAL_TABLET | Freq: Four times a day (QID) | ORAL | Status: DC | PRN

## 2016-05-13 MED ORDER — IOPAMIDOL (ISOVUE-370) INJECTION 76%
75.0000 mL | Freq: Once | INTRAVENOUS | Status: AC | PRN
  Administered 2016-05-13: 75 mL via INTRAVENOUS

## 2016-05-13 MED ORDER — POTASSIUM CHLORIDE CRYS ER 10 MEQ PO TBCR
10.0000 meq | EXTENDED_RELEASE_TABLET | Freq: Every day | ORAL | Status: DC
  Administered 2016-05-13: 10 meq via ORAL
  Filled 2016-05-13: qty 1

## 2016-05-13 MED ORDER — ENOXAPARIN SODIUM 40 MG/0.4ML ~~LOC~~ SOLN: 40.0000 mg | SUBCUTANEOUS | Status: DC

## 2016-05-13 MED ORDER — SODIUM CHLORIDE 0.9% FLUSH
3.0000 mL | Freq: Two times a day (BID) | INTRAVENOUS | Status: DC
  Administered 2016-05-13: 3 mL via INTRAVENOUS

## 2016-05-13 MED ORDER — MORPHINE SULFATE (PF) 2 MG/ML IV SOLN
2.0000 mg | Freq: Once | INTRAVENOUS | Status: AC
  Administered 2016-05-13: 2 mg via INTRAVENOUS

## 2016-05-13 MED ORDER — FUROSEMIDE 10 MG/ML IJ SOLN
40.0000 mg | Freq: Four times a day (QID) | INTRAMUSCULAR | Status: DC
  Administered 2016-05-13 (×2): 40 mg via INTRAVENOUS
  Filled 2016-05-13 (×2): qty 4

## 2016-05-13 NOTE — Discharge Summary (Signed)
[facility] [place] [facility]   PATIENT NAME: [name]    MR#:  [id number]  DATE OF BIRTH:  [birth year]  DATE OF ADMISSION:  05/13/2016 ADMITTING PHYSICIAN: Arnaldo Natal, MD  DATE OF DISCHARGE: 05/13/16  PRIMARY CARE PHYSICIAN: Carollee Leitz, NP    ADMISSION DIAGNOSIS:  chest pain  DISCHARGE DIAGNOSIS:  Principal Problem:   Pleuritic chest pain Active Problems:   Hyperlipidemia   Diabetes mellitus (HCC)   Moderate aortic stenosis   Coronary artery disease, non-occlusive   Hypertension   Leg swelling   Morbid obesity (HCC)   Positive D dimer   SECONDARY DIAGNOSIS:   Past Medical History:  Diagnosis Date  . Aortic atherosclerosis (HCC) 05/2013   Per TEE  . Arrhythmia   . Asthma   . Colon polyp   . Coronary artery disease    a. Nonobstructive by 2004 cath b. Nonobstructive by 2015 cath in setting of NSTEMI  . Diabetes mellitus without complication (HCC)    a. Hgb A1C 5.9% 05/2013  . GERD (gastroesophageal reflux disease)   . Heart murmur   . Hiatal hernia   . Hyperlipidemia   . Hypertension   . Moderate aortic stenosis    a. echo 2013: EF 55%, nl wall motion, mild DD, mild LVH, trace MR, moderate AS with peak gradient of 55 mm Hg; b. TEE 05/2013: EF EF 60-65%, borderline LVH, mild MR/AI, mod AS with valve area 1.0-1.26; c. echo 10/2014: 55-60%, no RWMA, GR1DD, mod AS w/ mean gradient of 28 mm Hg     . Obesity   . Onychomycosis   . Osteoarthritis     HOSPITAL COURSE:  [name]  is a 82 y.o. male admitted 05/13/2016 with chief complaint Chest Pain . Please see H&P performed by Arnaldo Natal, MD for further information. Patient presented with the above symptoms. Given elevated d-dimer concern for pulmonary embolism, underwent CT angiogram which was negative. Patient evaluated by cardiology no further workup required, cardiac enzymes trended and negative. Patient has remained chest pain-free while in the hospital  DISCHARGE  CONDITIONS:   Stable  CONSULTS OBTAINED:  Treatment Team:  Antonieta Iba, MD  DRUG ALLERGIES:   Allergies  Allergen Reactions  . Aspirin Rash and Other (See Comments)    Reaction: Trouble breathing     DISCHARGE MEDICATIONS:   Current Discharge Medication List    CONTINUE these medications which have NOT CHANGED   Details  atorvastatin (LIPITOR) 40 MG tablet TAKE 1 TABLET DAILY Qty: 90 tablet, Refills: 3    clopidogrel (PLAVIX) 75 MG tablet TAKE 1 TABLET DAILY Qty: 90 tablet, Refills: 3    furosemide (LASIX) 20 MG tablet TAKE 1 TABLET DAILY Qty: 90 tablet, Refills: 3    KLOR-CON M10 10 MEQ tablet TAKE 1 TABLET DAILY Qty: 90 tablet, Refills: 3    lisinopril (PRINIVIL,ZESTRIL) 10 MG tablet TAKE 1 TABLET DAILY Qty: 90 tablet, Refills: 3    metFORMIN (GLUCOPHAGE) 500 MG tablet Take 500 mg by mouth 2 (two) times daily with a meal.    TOPROL XL 25 MG 24 hr tablet TAKE 1 TABLET DAILY Qty: 90 tablet, Refills: 3    torsemide (DEMADEX) 20 MG tablet Take 1 tablet by mouth daily.         DISCHARGE INSTRUCTIONS:    DIET:  Cardiac diet  DISCHARGE CONDITION:  Stable  ACTIVITY:  Activity as tolerated  OXYGEN:  Home Oxygen: No.   Oxygen Delivery: room  air  DISCHARGE LOCATION:  home   If you experience worsening of your admission symptoms, develop shortness of breath, life threatening emergency, suicidal or homicidal thoughts you must seek medical attention immediately by calling 911 or calling your MD immediately  if symptoms less severe.  You Must read complete instructions/literature along with all the possible adverse reactions/side effects for all the Medicines you take and that have been prescribed to you. Take any new Medicines after you have completely understood and accpet all the possible adverse reactions/side effects.   Please note  You were cared for by a hospitalist during your hospital stay. If you have any questions about your discharge  medications or the care you received while you were in the hospital after you are discharged, you can call the unit and asked to speak with the hospitalist on call if the hospitalist that took care of you is not available. Once you are discharged, your primary care physician will handle any further medical issues. Please note that NO REFILLS for any discharge medications will be authorized once you are discharged, as it is imperative that you return to your primary care physician (or establish a relationship with a primary care physician if you do not have one) for your aftercare needs so that they can reassess your need for medications and monitor your lab values.    On the Vanevery of Discharge:   VITAL SIGNS:  Blood pressure (!) 105/40, pulse 82, temperature 98.3 F (36.8 C), temperature source Oral, resp. rate 18, height 5\' 11"  (1.803 m), weight 124.5 kg (274 lb 8 oz), SpO2 97 %.  I/O:   Intake/Output Summary (Last 24 hours) at 05/13/16 1245 Last data filed at 05/13/16 0439  Gross per 24 hour  Intake                0 ml  Output             1100 ml  Net            -1100 ml    PHYSICAL EXAMINATION:  GENERAL:  82 y.o.-year-old patient lying in the bed with no acute distress.  EYES: Pupils equal, round, reactive to light and accommodation. No scleral icterus. Extraocular muscles intact.  HEENT: Head atraumatic, normocephalic. Oropharynx and nasopharynx clear.  NECK:  Supple, no jugular venous distention. No thyroid enlargement, no tenderness.  LUNGS: Normal breath sounds bilaterally, no wheezing, rales,rhonchi or crepitation. No use of accessory muscles of respiration.  CARDIOVASCULAR: S1, S2 normal. 3/6 murmurs, rubs, or gallops.  ABDOMEN: Soft, non-tender, non-distended. Bowel sounds present. No organomegaly or mass.  EXTREMITIES: No pedal edema, cyanosis, or clubbing.  NEUROLOGIC: Cranial nerves II through XII are intact. Muscle strength 5/5 in all extremities. Sensation intact. Gait not  checked.  PSYCHIATRIC: The patient is alert and oriented x 3.  SKIN: No obvious rash, lesion, or ulcer.   DATA REVIEW:   CBC  Recent Labs Lab 05/13/16 0031  WBC 10.1  HGB 13.0  HCT 39.8*  PLT 194    Chemistries   Recent Labs Lab 05/13/16 0031  NA 140  K 3.7  CL 103  CO2 31  GLUCOSE 152*  BUN 12  CREATININE 0.93  CALCIUM 9.3    Cardiac Enzymes  Recent Labs Lab 05/13/16 1027  TROPONINI <0.03    Microbiology Results  No results found for this or any previous visit.  RADIOLOGY:  Dg Chest 2 View  Result Date: 05/13/2016 CLINICAL DATA:  Acute onset of generalized  chest pain and shortness of breath. Initial encounter. EXAM: CHEST  2 VIEW COMPARISON:  Chest radiograph performed 04/14/2016 FINDINGS: The lungs are well-aerated. Mild vascular congestion is noted. Mildly increased interstitial markings may reflect minimal interstitial edema. There is no evidence of pleural effusion or pneumothorax. The heart is normal in size; the mediastinal contour is within normal limits. No acute osseous abnormalities are seen. IMPRESSION: Mild vascular congestion. Mildly increased interstitial markings may reflect minimal interstitial edema. Electronically Signed   By: Roanna Raider M.D.   On: 05/13/2016 00:39   Ct Angio Chest Pe W Or Wo Contrast  Result Date: 05/13/2016 CLINICAL DATA:  82 year old with shortness breath and chest pain. History of coronary artery disease and stroke. Lower extremity edema. Question pulmonary embolism. EXAM: CT ANGIOGRAPHY CHEST WITH CONTRAST TECHNIQUE: Multidetector CT imaging of the chest was performed using the standard protocol during bolus administration of intravenous contrast. Multiplanar CT image reconstructions and MIPs were obtained to evaluate the vascular anatomy. CONTRAST:  75 ml Isovue 370. COMPARISON:  Chest radiographs 05/05/2016 FINDINGS: Cardiovascular: The pulmonary arteries are well opacified with contrast to the level of the  subsegmental branches. There is no evidence of acute pulmonary embolism. There is diffuse atherosclerosis of the aorta, great vessels and coronary arteries. Dense calcifications are present at the aortic valve. The heart size is normal. There is no pericardial effusion. Mediastinum/Nodes: There are no enlarged mediastinal, hilar or axillary lymph nodes. The thyroid gland, trachea and esophagus demonstrate no significant findings. Lungs/Pleura: There is no pleural effusion. Mild dependent atelectasis at both lung bases. No confluent airspace opacity, endobronchial lesion or suspicious pulmonary nodule. Upper abdomen: No acute findings are seen within the visualized upper abdomen status post cholecystectomy. There are low-density left renal cyst. In addition, there is a 2 cm lesion projecting laterally from the interpolar region which measures higher than water density (24 HU on image 101). This is incompletely visualized. Musculoskeletal/Chest wall: There is no chest wall mass or suspicious osseous finding. Mild bilateral gynecomastia. There are paraspinal osteophytes throughout the thoracic spine with a prominent osteophyte at the right seventh costovertebral junction. Review of the MIP images confirms the above findings. IMPRESSION: 1. No evidence of acute pulmonary embolism or other acute chest process. 2. Prominent calcifications at the aortic valve, predisposing to aortic stenosis. Atherosclerosis of the aorta, great vessels and coronary arteries. 3. Mild bibasilar atelectasis. 4. Indeterminate exophytic lesion from the interpolar region of the left kidney, measuring higher than water density and likely reflecting a complex cyst. There are additional simple left renal cysts. Patient had a prior CT 09/21/2003 which is currently not available for comparison. Direct comparison with that study would be helpful to evaluate stability. Electronically Signed   By: Carey Bullocks M.D.   On: 05/13/2016 12:09      Management plans discussed with the patient, family and they are in agreement.  CODE STATUS:     Code Status Orders        Start     Ordered   05/13/16 0436  Full code  Continuous     05/13/16 0435    Code Status History    Date Active Date Inactive Code Status Order ID Comments User Context   06/21/2015  5:33 AM 06/21/2015  3:53 PM Full Code [id number]  Ihor Austin, MD ED      TOTAL TIME TAKING CARE OF THIS PATIENT: 33 minutes.    Hower,  Mardi Mainland.D on 05/13/2016 at 12:45 PM  Between 7am to  6pm - Pager - [phone]  After 6pm go to [email or web address] - Social research officer, [email or web address] [email or web address]  [facility] [place] [facility]  Office  [phone]  CC: Primary care physician; Carollee Leitzarrie M Doss, NP

## 2016-05-13 NOTE — Care Management Obs Status (Signed)
MEDICARE OBSERVATION STATUS NOTIFICATION   Patient Details  Name: Jeffery PattenMonroe Kings MRN: [id number] Date of Birth: 1931-04-17   Medicare Observation Status Notification Given:  Yes  Did not have patient to sign because he is under isolation precautions.  Explained notice.  no questions    Eber HongGreene, Shandy Vi R, RN 05/13/2016, 11:53 AM

## 2016-05-13 NOTE — Consult Note (Signed)
Cardiology Consultation Note  Patient ID: [name], MRN: [id number], DOB/AGE: Apr 15, 1931 82 y.o. Admit date: 05/13/2016   Date of Consult: 05/13/2016 Primary Physician: Jeffery Leitzarrie M Doss, NP Primary Cardiologist: Dr. Mariah MillingGollan, MD Requesting Physician: Dr. Sheryle Hailiamond, MD  Chief Complaint: Chest pain with deep breath Reason for Consult: Same  HPI: 82 y.o. male with h/o nonobstructive CAD by cardiac cath in 2004 and 2015, moderate aortic stenosis, minimal carotid arterial disease, DM2, obesity, chronic lower extremity swelling, and tobacco abuse who presents for ED follow up of pleuritic chest pain.   Last cardiac cath in 05/21/2013 showed left main 30% stenosis, mid LAD diffuse 30%, mid LCx 20% proximal RCA 30%, mild systemic HTN, moderately elevated LVEDP, EF by echo 60%, there was severe aortic stenosis with a mean gradient of 28 mm Hg and a peak gradient of 36 mm Hg and acalculated valve area of 0.9. It was recommended he have a TEE for possible AVR. Outside echo in 2013 showed EF 55%, normal wall motion, mild diastolic dysfunction, mild LVH, trace MR, and an estimated aortic peak gradient of 55 mm Hg. He underwent TEE on 05/22/2013 that showed EF 60-65%, borderline LVH, mildly dilated left atrium, mild MR, mild AI, moderate aortic stenosis with a valve area between 1.0-1.26, moderate plaque involving the descending aorta and aortic arch. He last underwent echo 10/22/2014 for pre-op evaluation (he did not have this knee surgery done) that showed an EF of 55-60%, no RWMA, GR1DD, moderate aortic stenosis with a mean gradient of 28 mm Hg, valve area of (VTI) 1.07, valve area (Vmax) 1.08, left atrium mildly dilated. He has chronic lower extremity swelling from previous trauma. He does not wear compression hose. Prior lower extremity dopplers have been negative for DVT, most recently 05/08/2015.   He was seen in the ED and consulted on by cardiology 06/21/2015 for nonexertional, pleuritic chest pain that woke him from  his sleep. No associated symptoms. BP was elevated at 159 systolic. Troponin was negative x 3. CXR non-acute. He was discharged with outpatient follow up. He underwent outpatient Lexiscan Myoview for this pain in 06/2015 that was low risk.   Patient again developed his same pleuritic chest pain on 12/27 while sitting in his chair. Pain was 4/10 and came on with deep inspiration. He was otherwise without chest pain. Some associated SOB. No associated diaphoresis, nausea, vomiting, palpitations, dizziness, presyncope, or syncope. No changes in his LE swelling.   Upon the patient's arrival to [facility] they were found to have negative troponin x 2. D dimer significantly elevated at 1800, BNP 32 SCr 0.93, K+ 3.7, wbc 10.1, hgb 13.0, plt 194. ECG showed sinus tachycardia, 108 bpm, RBBB, poor R wave progression, CXR showed mild vascular congestion with possible interstitial edema. CTA chest to evaluate for PE was not done. Currently without pain unless with deep breath.   Past Medical History:  Diagnosis Date  . Aortic atherosclerosis (HCC) 05/2013   Per TEE  . Arrhythmia   . Asthma   . Colon polyp   . Coronary artery disease    a. Nonobstructive by 2004 cath b. Nonobstructive by 2015 cath in setting of NSTEMI  . Diabetes mellitus without complication (HCC)    a. Hgb A1C 5.9% 05/2013  . GERD (gastroesophageal reflux disease)   . Heart murmur   . Hiatal hernia   . Hyperlipidemia   . Hypertension   . Moderate aortic stenosis    a. echo 2013: EF 55%, nl wall motion, mild DD, mild  LVH, trace MR, moderate AS with peak gradient of 55 mm Hg; b. TEE 05/2013: EF EF 60-65%, borderline LVH, mild MR/AI, mod AS with valve area 1.0-1.26; c. echo 10/2014: 55-60%, no RWMA, GR1DD, mod AS w/ mean gradient of 28 mm Hg     . Obesity   . Onychomycosis   . Osteoarthritis       Most Recent Cardiac Studies: Cardiac cath 2015: showed left main 30% stenosis, mid LAD diffuse 30%, mid LCx 20% proximal RCA 30%, mild systemic HTN,  moderately elevated LVEDP, EF by echo 60%, there was severe aortic stenosis with a mean gradient of 28 mm Hg and a peak gradient of 36 mm Hg and acalculated valve area of 0.9. It was recommended he have a TEE for possible AVR  Echo 02/2016: Study Conclusions  - Left ventricle: The cavity size was normal. There was moderate   concentric hypertrophy. Systolic function was vigorous. The   estimated ejection fraction was in the range of 65% to 70%. Wall   motion was normal; there were no regional wall motion   abnormalities. Left ventricular diastolic function parameters   were normal for the patient&'s age. There appears to be resting LV   outflow tract gradient of 9mmHg. This is in setting of vigorous   LVEF. - Aortic valve: Thickening. Cusp separation was reduced. There was   moderate to severe stenosis. There appears to be aortic   regurgitation but no available spectral doppler for review.   Recommend reevaluation. Valve area (VTI): 1.3 cm^2. Peak velocity   ratio of LVOT to aortic valve: 0.33. Valve area (Vmax): 1.26   cm^2. Valve area (Vmean): 1.37 cm^2. - Ascending aorta: The ascending aorta was mildly dilated. - Left atrium: The atrium was mildly dilated. - Tricuspid valve: There was moderate regurgitation. - Inferior vena cava: Not visualized.   Surgical History:  Past Surgical History:  Procedure Laterality Date  . CARDIAC CATHETERIZATION  11/2002  . CARDIAC CATHETERIZATION  05/21/2013   showing 30% oLM, 30% D1, 20% mLCx, 30% pRCA; EF 60%, severe AS (mean gradient 28 mmHg, peak 26, area 0.9)  . CHOLECYSTECTOMY  2001  . TRANSESOPHAGEAL ECHOCARDIOGRAM  05/22/2013   Preserved EF, moderate AS (valve area by planimetry between 1.0-1.26 cm sq), moderate aortic arch and descending aortic atherosclerosis.   . TRANSTHORACIC ECHOCARDIOGRAM  05/19/2013   EF 60-65%, impaired diastolic function, mild LA dilatation, mild MR/AI/TR, mod AS, high normal RVSP (30.4 mmHg)     Home Meds: Prior to  Admission medications   Medication Sig Start Date End Date Taking? Authorizing Provider  atorvastatin (LIPITOR) 40 MG tablet TAKE 1 TABLET DAILY 11/21/15   Antonieta Ibaimothy J Gollan, MD  clopidogrel (PLAVIX) 75 MG tablet TAKE 1 TABLET DAILY 11/21/15   Antonieta Ibaimothy J Gollan, MD  furosemide (LASIX) 20 MG tablet TAKE 1 TABLET DAILY 11/21/15   Antonieta Ibaimothy J Gollan, MD  KLOR-CON M10 10 MEQ tablet TAKE 1 TABLET DAILY 11/21/15   Antonieta Ibaimothy J Gollan, MD  lisinopril (PRINIVIL,ZESTRIL) 10 MG tablet TAKE 1 TABLET DAILY 11/21/15   Antonieta Ibaimothy J Gollan, MD  metFORMIN (GLUCOPHAGE) 500 MG tablet Take 500 mg by mouth 2 (two) times daily with a meal.    Historical Provider, MD  TOPROL XL 25 MG 24 hr tablet TAKE 1 TABLET DAILY 11/21/15   Antonieta Ibaimothy J Gollan, MD  torsemide (DEMADEX) 20 MG tablet Take 1 tablet by mouth daily. 04/20/16   Historical Provider, MD    Inpatient Medications:  . atorvastatin  40 mg  Oral Daily  . clopidogrel  75 mg Oral Daily  . docusate sodium  100 mg Oral BID  . enoxaparin (LOVENOX) injection  40 mg Subcutaneous Q24H  . furosemide  40 mg Intravenous Q6H  . insulin aspart  0-15 Units Subcutaneous TID WC  . lisinopril  10 mg Oral Daily  . metoprolol succinate  25 mg Oral Daily  . potassium chloride  10 mEq Oral Daily  . sodium chloride flush  3 mL Intravenous Q12H  . [START ON 05/14/2016] torsemide  20 mg Oral Daily     Allergies:  Allergies  Allergen Reactions  . Aspirin Rash and Other (See Comments)    Reaction: Trouble breathing     Social History   Social History  . Marital status: Married    Spouse name: N/A  . Number of children: N/A  . Years of education: N/A   Occupational History  . Retired Teacher, music   Social History Main Topics  . Smoking status: Former Smoker    Packs/Luster: 1.50    Years: 20.00    Types: Cigarettes    Quit date: 02/22/1981  . Smokeless tobacco: Never Used  . Alcohol use No  . Drug use: No  . Sexual activity: Not on file    Other Topics Concern  . Not on file   Social History Narrative   Mr. Kalp lives in [place] with his wife. They have a son and daughter. He is retired from [employer]. He enjoys fishing.     Family History  Problem Relation Age of Onset  . Heart disease Mother   . Heart disease Father 19  . Stroke Brother      Review of Systems: Review of Systems  Constitutional: Positive for malaise/fatigue. Negative for chills, diaphoresis, fever and weight loss.  HENT: Negative for congestion.   Eyes: Negative for discharge and redness.  Respiratory: Positive for shortness of breath. Negative for cough, hemoptysis, sputum production and wheezing.   Cardiovascular: Positive for chest pain. Negative for palpitations, orthopnea, claudication, leg swelling and PND.       Chest pain associated with deep inspiration   Gastrointestinal: Negative for abdominal pain, blood in stool, heartburn, melena, nausea and vomiting.  Genitourinary: Negative for hematuria.  Musculoskeletal: Negative for falls and myalgias.  Skin: Negative for rash.  Neurological: Negative for dizziness, tingling, tremors, sensory change, speech change, focal weakness, loss of consciousness and weakness.  Endo/Heme/Allergies: Does not bruise/bleed easily.  Psychiatric/Behavioral: Negative for substance abuse. The patient is not nervous/anxious.   All other systems reviewed and are negative.   Labs:  Recent Labs  05/13/16 0031 05/13/16 0445  TROPONINI <0.03 <0.03   Lab Results  Component Value Date   WBC 10.1 05/13/2016   HGB 13.0 05/13/2016   HCT 39.8 (L) 05/13/2016   MCV 79.5 (L) 05/13/2016   PLT 194 05/13/2016     Recent Labs Lab 05/13/16 0031  NA 140  K 3.7  CL 103  CO2 31  BUN 12  CREATININE 0.93  CALCIUM 9.3  GLUCOSE 152*   Lab Results  Component Value Date   CHOL 144 01/21/2016   HDL 51.30 01/21/2016   LDLCALC 81 01/21/2016   TRIG 58.0 01/21/2016   Lab Results  Component Value Date   DDIMER  0.89 (H) 03/26/2015    Radiology/Studies:  Dg Chest 2 View  Result Date: 05/13/2016 CLINICAL DATA:  Acute onset of generalized chest pain and  shortness of breath. Initial encounter. EXAM: CHEST  2 VIEW COMPARISON:  Chest radiograph performed 04/14/2016 FINDINGS: The lungs are well-aerated. Mild vascular congestion is noted. Mildly increased interstitial markings may reflect minimal interstitial edema. There is no evidence of pleural effusion or pneumothorax. The heart is normal in size; the mediastinal contour is within normal limits. No acute osseous abnormalities are seen. IMPRESSION: Mild vascular congestion. Mildly increased interstitial markings may reflect minimal interstitial edema. Electronically Signed   By: Roanna Raider M.D.   On: 05/13/2016 00:39   Dg Chest 2 View  Result Date: 04/14/2016 CLINICAL DATA:  Peripheral swelling EXAM: CHEST  2 VIEW COMPARISON:  06/21/2015 FINDINGS: Cardiac shadow is stable. The lungs are well aerated bilaterally. No focal infiltrate or sizable effusion is seen. IMPRESSION: No active cardiopulmonary disease. Electronically Signed   By: Alcide Clever M.D.   On: 04/14/2016 18:47    EKG: Interpreted by me showed: sinus tachycardia, 108 bpm, RBBB, poor R wave progression Telemetry: Interpreted by me showed: NSR, 90's bpm  Weights: Filed Weights   05/13/16 0017 05/13/16 0439  Weight: 280 lb (127 kg) 274 lb 8 oz (124.5 kg)     Physical Exam: Blood pressure (!) 130/57, pulse 92, temperature 97.6 F (36.4 C), temperature source Oral, resp. rate 18, height 5\' 11"  (1.803 m), weight 274 lb 8 oz (124.5 kg), SpO2 95 %. Body mass index is 38.28 kg/m. General: Well developed, well nourished, in no acute distress. Head: Normocephalic, atraumatic, sclera non-icteric, no xanthomas, nares are without discharge.  Neck: Negative for carotid bruits. JVD not elevated. Lungs: Clear bilaterally to auscultation without wheezes, rales, or rhonchi. Breathing is unlabored.  Pain is worse with deep inspiration.  Heart: RRR with S1 S2. No murmurs, rubs, or gallops appreciated. Abdomen: Soft, non-tender, distended with normoactive bowel sounds. No hepatomegaly. No rebound/guarding. No obvious abdominal masses. Msk:  Strength and tone appear normal for age. Extremities: No clubbing or cyanosis. 2+ pitting edema. Distal pedal pulses are 2+ and equal bilaterally. Neuro: Alert and oriented X 3. No facial asymmetry. No focal deficit. Moves all extremities spontaneously. Psych:  Responds to questions appropriately with a normal affect.    Assessment and Plan:  Principal Problem:   Pleuritic chest pain Active Problems:   Positive D dimer   Diabetes mellitus (HCC)   Moderate aortic stenosis   Coronary artery disease, non-occlusive   Hypertension   Leg swelling   Morbid obesity (HCC)   Hyperlipidemia    1. Pleuritic chest pain/positive D dimer: -Pain worse with deep inspiration, otherwise he is without pain -Patient has negative troponin x 2, cycle one more value to rule out -His D dimer is significantly elevated -Needs CTA chest PE protocol this morning to evaluate for PE -Has recent cardiac cath in 2004 and 2015 showing minimal nonobstructive CAD -Has recent nuclear stress test 06/2015 that was low risk -Cannot rule out mild diastolic dysfunction, continue Lasix as below  2. Moderate to severe aortic stenosis: -Recent echo 02/2016 showing vigorous EF with moderate to severe AS -Needs outpatient follow up  3. Nonocclusive CAD: -As above -Continue current medications -On Plavix as he is allergic to ASA  4. LE swelling: -Chronic issue, felt to be 2/2 venous insufficiency -Compression hose -Continue Lasix  5. DM: -Per IM  6. HLD: -Lipitor  7. HTN: -Continue current medications   Signed, Carola Frost [facility] Pager: [phone] 05/13/2016, 7:42 AM

## 2016-05-13 NOTE — ED Notes (Signed)
Pt urinated into urinal, helped to standing position from lying in bed by this RN.

## 2016-05-13 NOTE — Progress Notes (Signed)
Patient was transferred from the ER following c/o chest pain. On admission patient was A&O X4,  ambulatory with cane, denied chest pain, N&V and was NSR with VS WDL for patient. Patient was oriented to his room, bed alarm on and patient's needed items placed within reach. Patient kept NPO per order.

## 2016-05-13 NOTE — Telephone Encounter (Signed)
Patient [facility] on 05/13/16.  Patient understands to follow up with Dr. Mariah MillingGollan on 05/26/16 at 10:40 [facility]. Patient understands discharge instructions? yes Patient understands medications and regiment? yes Patient understands to bring all medications to this visit? yes

## 2016-05-13 NOTE — H&P (Signed)
[name] is an 82 y.o. male.   Chief Complaint: Chest pain HPI: The patient with past medical history of coronary artery disease as well as aortic stenosis presents emergency department complaining of shortness of breath. History of stroke reports chest pain but the patient does not endorse this complaint with the examiner. In the emergency department he was found to have severe edema of his lower extremities which she states is worse than baseline. He was also short of breath. After administration of Lasix 60 mg IV the patient was breathing easier. Physical exam was significant for murmur consistent with severe aortic stenosis. This finding in addition to his subjective shortness of breath and dramatic lower extremity edema prompted emergency department staff to call the hospitalist service for admission.  Past Medical History:  Diagnosis Date  . Aortic atherosclerosis ([place]) 05/2013   Per TEE  . Arrhythmia   . Asthma   . Colon polyp   . Coronary artery disease    a. Nonobstructive by 2004 cath b. Nonobstructive by 2015 cath in setting of NSTEMI  . Diabetes mellitus without complication ([facility])    a. Hgb A1C 5.9% 05/2013  . GERD (gastroesophageal reflux disease)   . Heart murmur   . Hiatal hernia   . Hyperlipidemia   . Hypertension   . Moderate aortic stenosis    a. echo 2013: EF 55%, nl wall motion, mild DD, mild LVH, trace MR, moderate AS with peak gradient of 55 mm Hg; b. TEE 05/2013: EF EF 60-65%, borderline LVH, mild MR/AI, mod AS with valve area 1.0-1.26; c. echo 10/2014: 55-60%, no RWMA, GR1DD, mod AS w/ mean gradient of 28 mm Hg     . Obesity   . Onychomycosis   . Osteoarthritis     Past Surgical History:  Procedure Laterality Date  . CARDIAC CATHETERIZATION  11/2002  . CARDIAC CATHETERIZATION  05/21/2013   showing 30% oLM, 30% D1, 20% mLCx, 30% pRCA; EF 60%, severe AS (mean gradient 28 mmHg, peak 26, area 0.9)  . CHOLECYSTECTOMY  2001  . TRANSESOPHAGEAL ECHOCARDIOGRAM  05/22/2013   Preserved EF, moderate AS (valve area by planimetry between 1.0-1.26 cm sq), moderate aortic arch and descending aortic atherosclerosis.   . TRANSTHORACIC ECHOCARDIOGRAM  05/19/2013   EF 60-65%, impaired diastolic function, mild LA dilatation, mild MR/AI/TR, mod AS, high normal RVSP (30.4 mmHg)    Family History  Problem Relation Age of Onset  . Heart disease Mother   . Heart disease Father 6  . Stroke Brother    Social History:  reports that he quit smoking about 35 years ago. His smoking use included Cigarettes. He has a 30.00 pack-year smoking history. He has never used smokeless tobacco. He reports that he does not drink alcohol or use drugs.  Allergies:  Allergies  Allergen Reactions  . Aspirin Rash and Other (See Comments)    Reaction: Trouble breathing     Medications Prior to Admission  Medication Sig Dispense Refill  . atorvastatin (LIPITOR) 40 MG tablet TAKE 1 TABLET DAILY 90 tablet 3  . clopidogrel (PLAVIX) 75 MG tablet TAKE 1 TABLET DAILY 90 tablet 3  . furosemide (LASIX) 20 MG tablet TAKE 1 TABLET DAILY 90 tablet 3  . KLOR-CON M10 10 MEQ tablet TAKE 1 TABLET DAILY 90 tablet 3  . lisinopril (PRINIVIL,ZESTRIL) 10 MG tablet TAKE 1 TABLET DAILY 90 tablet 3  . metFORMIN (GLUCOPHAGE) 500 MG tablet Take 500 mg by mouth 2 (two) times daily with a meal.    .  TOPROL XL 25 MG 24 hr tablet TAKE 1 TABLET DAILY 90 tablet 3  . torsemide (DEMADEX) 20 MG tablet Take 1 tablet by mouth daily.      Results for orders placed or performed during the hospital encounter of 05/13/16 (from the past 48 hour(s))  Basic metabolic panel     Status: Abnormal   Collection Time: 05/13/16 12:31 AM  Result Value Ref Range   Sodium 140 135 - 145 mmol/L   Potassium 3.7 3.5 - 5.1 mmol/L   Chloride 103 101 - 111 mmol/L   CO2 31 22 - 32 mmol/L   Glucose, Bld 152 (H) 65 - 99 mg/dL   BUN 12 6 - 20 mg/dL   Creatinine, Ser 0.93 0.61 - 1.24 mg/dL   Calcium 9.3 8.9 - 10.3 mg/dL   GFR calc non Af Amer >60  >60 mL/min   GFR calc Af Amer >60 >60 mL/min    Comment: (NOTE) The eGFR has been calculated using the CKD EPI equation. This calculation has not been validated in all clinical situations. eGFR's persistently <60 mL/min signify possible Chronic Kidney Disease.    Anion gap 6 5 - 15  CBC     Status: Abnormal   Collection Time: 05/13/16 12:31 AM  Result Value Ref Range   WBC 10.1 3.8 - 10.6 K/uL   RBC 5.01 4.40 - 5.90 MIL/uL   Hemoglobin 13.0 13.0 - 18.0 g/dL   HCT 39.8 (L) 40.0 - 52.0 %   MCV 79.5 (L) 80.0 - 100.0 fL   MCH 26.0 26.0 - 34.0 pg   MCHC 32.7 32.0 - 36.0 g/dL   RDW 15.0 (H) 11.5 - 14.5 %   Platelets 194 150 - 440 K/uL  Troponin I     Status: None   Collection Time: 05/13/16 12:31 AM  Result Value Ref Range   Troponin I <0.03 <0.03 ng/mL  Brain natriuretic peptide     Status: None   Collection Time: 05/13/16 12:31 AM  Result Value Ref Range   B Natriuretic Peptide 32.0 0.0 - 100.0 pg/mL  Fibrin derivatives D-Dimer     Status: Abnormal   Collection Time: 05/13/16 12:31 AM  Result Value Ref Range   Fibrin derivatives D-dimer ([facility]) 1,801 (H) 0 - 499    Comment: <> Exclusion of Venous Thromboembolism (VTE) - OUTPATIENTS ONLY        (Emergency Department or [place])             0-499 ng/ml (FEU)  : With a low to intermediate pretest                                        probability for VTE this test result                                        excludes the diagnosis of VTE.           > 499 ng/ml (FEU)  : VTE not excluded.  Additional work up                                   for VTE is required.   <>  Testing on Inpatients and Evaluation of Disseminated Intravascular  Coagulation (DIC)             Reference Range:   0-499 ng/ml (FEU)   TSH     Status: None   Collection Time: 05/13/16  4:45 AM  Result Value Ref Range   TSH 0.865 0.350 - 4.500 uIU/mL    Comment: Performed by a 3rd Generation assay with a functional sensitivity of <=0.01 uIU/mL.  Troponin  I     Status: None   Collection Time: 05/13/16  4:45 AM  Result Value Ref Range   Troponin I <0.03 <0.03 ng/mL  Glucose, capillary     Status: Abnormal   Collection Time: 05/13/16  5:56 AM  Result Value Ref Range   Glucose-Capillary 128 (H) 65 - 99 mg/dL   Dg Chest 2 View  Result Date: 05/13/2016 CLINICAL DATA:  Acute onset of generalized chest pain and shortness of breath. Initial encounter. EXAM: CHEST  2 VIEW COMPARISON:  Chest radiograph performed 04/14/2016 FINDINGS: The lungs are well-aerated. Mild vascular congestion is noted. Mildly increased interstitial markings may reflect minimal interstitial edema. There is no evidence of pleural effusion or pneumothorax. The heart is normal in size; the mediastinal contour is within normal limits. No acute osseous abnormalities are seen. IMPRESSION: Mild vascular congestion. Mildly increased interstitial markings may reflect minimal interstitial edema. Electronically Signed   By: Garald Balding M.D.   On: 05/13/2016 00:39    Review of Systems  Constitutional: Negative for chills and fever.  HENT: Negative for sore throat and tinnitus.   Eyes: Negative for blurred vision and redness.  Respiratory: Positive for shortness of breath. Negative for cough.   Cardiovascular: Positive for chest pain (resolved) and leg swelling. Negative for palpitations, orthopnea and PND.  Gastrointestinal: Negative for abdominal pain, diarrhea, nausea and vomiting.  Genitourinary: Negative for dysuria, frequency and urgency.  Musculoskeletal: Negative for joint pain and myalgias.  Skin: Negative for rash.       No lesions  Neurological: Negative for speech change, focal weakness and weakness.  Endo/Heme/Allergies: Does not bruise/bleed easily.       No temperature intolerance  Psychiatric/Behavioral: Negative for depression and suicidal ideas.    Blood pressure (!) 130/57, pulse 92, temperature 97.6 F (36.4 C), temperature source Oral, resp. rate 18, height  5' 11"  (1.803 m), weight 124.5 kg (274 lb 8 oz), SpO2 95 %. Physical Exam  Constitutional: He is oriented to person, place, and time. He appears well-developed and well-nourished. No distress.  HENT:  Head: Normocephalic and atraumatic.  Mouth/Throat: Oropharynx is clear and moist.  Eyes: Conjunctivae and EOM are normal. Pupils are equal, round, and reactive to light. No scleral icterus.  Neck: Normal range of motion. Neck supple. No JVD present. No tracheal deviation present. No thyromegaly present.  Cardiovascular: Normal rate and regular rhythm.  Exam reveals no gallop and no friction rub.   Murmur heard.  Systolic murmur is present with a grade of 3/6  GI: Soft. Bowel sounds are normal. He exhibits no distension. There is no tenderness.  Genitourinary:  Genitourinary Comments: Deferred  Musculoskeletal: Normal range of motion. He exhibits edema.  Lymphadenopathy:    He has no cervical adenopathy.  Neurological: He is alert and oriented to person, place, and time. No cranial nerve deficit.  Skin: Skin is warm and dry. No rash noted. No erythema.  Psychiatric: He has a normal mood and affect. His behavior is normal. Judgment and thought content normal.     Assessment/Plan This is an 82 year old male admitted  for uncontrolled congestive heart failure. 1. Congestive heart failure: Secondary to aortic stenosis. The patient's last echocardiogram was 2 months ago which showed preserved left ventricular ejection fraction as well as normal diastolic ejection fraction although the latter has been impaired in the past. He is responding well to Lasix will continue IV diuresis until tomorrow at which time he may go back on his maintenance medications. Repeat echocardiogram at the discretion of cardiology input. 2. Chest pain: No longer present; likely secondary to pulmonary vascular congestion. Troponin negative so far. Continue to monitor telemetry. 3. Coronary artery disease: Stable. Continue  Plavix 4. Essential hypertension: Continue lisinopril and metoprolol 5. Hyperlipidemia: Continue statin therapy 6. Diabetes mellitus type 2: Hold oral medications; sliding scale insulin while hospitalized 7. DVT prophylaxis: Lovenox 8. GI prophylaxis: None The patient is a full code. Time spent on admission orders and patient care approximately 45 minutes  Harrie Foreman, MD 05/13/2016, 7:19 AM

## 2016-05-13 NOTE — Discharge Instructions (Signed)

## 2016-05-13 NOTE — Telephone Encounter (Signed)
tcm ph [facility] for chest pain  Needs 1 week fu from 05/13/16  Scheduled with Gollan 05-26-16 at 1040

## 2016-05-13 NOTE — ED Triage Notes (Signed)
Pt presents to ED with c/o chest pain accompanied by shortness of breath starting 30 min PTA. Pt denies n/v, diaphoresis, or headache. Pt alert and oriented x 4, skin warm and dry, respirations even and unlabored.

## 2016-05-14 LAB — HEMOGLOBIN A1C
Hgb A1c MFr Bld: 5.5 % (ref 4.8–5.6)
Mean Plasma Glucose: 111 mg/dL

## 2016-05-18 NOTE — ED Provider Notes (Signed)
[facility] Emergency Department Provider Note   First MD Initiated Contact with Patient 05/13/16 0022     (approximate)  I have reviewed the triage vital signs and the nursing notes.  Street [place] secondary to dementia HISTORY  Chief Complaint Chest Pain   HPI [name] is a 83 y.o. male presents to the emergency department with complaint of progressive dyspnea and worsening bilateral lower extremity edema times one Labat. Patient also admits to central chest discomfort that is nonradiating currently 5 out of 10. Past Medical History:  Diagnosis Date  . Aortic atherosclerosis (HCC) 05/2013   Per TEE  . Arrhythmia   . Asthma   . Colon polyp   . Coronary artery disease    a. Nonobstructive by 2004 cath b. Nonobstructive by 2015 cath in setting of NSTEMI  . Diabetes mellitus without complication (HCC)    a. Hgb A1C 5.9% 05/2013  . GERD (gastroesophageal reflux disease)   . Heart murmur   . Hiatal hernia   . Hyperlipidemia   . Hypertension   . Moderate aortic stenosis    a. echo 2013: EF 55%, nl wall motion, mild DD, mild LVH, trace MR, moderate AS with peak gradient of 55 mm Hg; b. TEE 05/2013: EF EF 60-65%, borderline LVH, mild MR/AI, mod AS with valve area 1.0-1.26; c. echo 10/2014: 55-60%, no RWMA, GR1DD, mod AS w/ mean gradient of 28 mm Hg     . Obesity   . Onychomycosis   . Osteoarthritis     Patient Active Problem List   Diagnosis Date Noted  . Pleuritic chest pain 05/13/2016  . Positive D dimer 05/13/2016  . SOB (shortness of breath)   . Leg swelling 03/26/2015  . Morbid obesity (HCC) 03/26/2015  . Coronary artery disease, non-occlusive 06/05/2013  . Hypertension 06/05/2013  . Hyperlipidemia 02/23/2012  . Diabetes mellitus (HCC) 02/23/2012  . Moderate aortic stenosis 02/23/2012    Past Surgical History:  Procedure Laterality Date  . CARDIAC CATHETERIZATION  11/2002  . CARDIAC CATHETERIZATION  05/21/2013   showing 30% oLM, 30% D1, 20%  mLCx, 30% pRCA; EF 60%, severe AS (mean gradient 28 mmHg, peak 26, area 0.9)  . CHOLECYSTECTOMY  2001  . TRANSESOPHAGEAL ECHOCARDIOGRAM  05/22/2013   Preserved EF, moderate AS (valve area by planimetry between 1.0-1.26 cm sq), moderate aortic arch and descending aortic atherosclerosis.   . TRANSTHORACIC ECHOCARDIOGRAM  05/19/2013   EF 60-65%, impaired diastolic function, mild LA dilatation, mild MR/AI/TR, mod AS, high normal RVSP (30.4 mmHg)    Prior to Admission medications   Medication Sig Start Date End Date Taking? Authorizing Provider  atorvastatin (LIPITOR) 40 MG tablet TAKE 1 TABLET DAILY 11/21/15   Antonieta Iba, MD  clopidogrel (PLAVIX) 75 MG tablet TAKE 1 TABLET DAILY 11/21/15   Antonieta Iba, MD  furosemide (LASIX) 20 MG tablet TAKE 1 TABLET DAILY 11/21/15   Antonieta Iba, MD  KLOR-CON M10 10 MEQ tablet TAKE 1 TABLET DAILY 11/21/15   Antonieta Iba, MD  lisinopril (PRINIVIL,ZESTRIL) 10 MG tablet TAKE 1 TABLET DAILY 11/21/15   Antonieta Iba, MD  metFORMIN (GLUCOPHAGE) 500 MG tablet Take 500 mg by mouth 2 (two) times daily with a meal.    Historical Provider, MD  TOPROL XL 25 MG 24 hr tablet TAKE 1 TABLET DAILY 11/21/15   Antonieta Iba, MD  torsemide (DEMADEX) 20 MG tablet Take 1 tablet by mouth daily. 04/20/16   Historical Provider, MD  Allergies Aspirin  Family History  Problem Relation Age of Onset  . Heart disease Mother   . Heart disease Father 49  . Stroke Brother     Social History Social History  Substance Use Topics  . Smoking status: Former Smoker    Packs/Sawyer: 1.50    Years: 20.00    Types: Cigarettes    Quit date: 02/22/1981  . Smokeless tobacco: Never Used  . Alcohol use No    Review of Systems Constitutional: No fever/chills Eyes: No visual changes. ENT: No sore throat. Cardiovascular:Positive for chest pain. Respiratory: Positive for shortness of breath. Gastrointestinal: No abdominal pain.  No nausea, no vomiting.  No diarrhea.  No  constipation. Genitourinary: Negative for dysuria. Musculoskeletal: Negative for back pain. Positive for lower extremity swelling Skin: Negative for rash. Neurological: Negative for headache weakness or numbness 10-point ROS otherwise negative.  ____________________________________________   PHYSICAL EXAM:  VITAL SIGNS: ED Triage Vitals  Enc Vitals Group     BP 05/13/16 0019 (!) 117/49     Pulse Rate 05/13/16 0019 100     Resp 05/13/16 0019 20     Temp 05/13/16 0019 98.4 F (36.9 C)     Temp Source 05/13/16 0019 Oral     SpO2 05/13/16 0019 98 %     Weight 05/13/16 0017 280 lb (127 kg)     Height 05/13/16 0017 5\' 11"  (1.803 m)     Head Circumference --      Peak Flow --      Pain Score 05/13/16 0017 5     Pain Loc --      Pain Edu? --      Excl. in GC? --     Constitutional: Alert and Well appearing and in no acute distress. Eyes: Conjunctivae are normal. PERRL. EOMI. Head: Atraumatic. Ears:  Healthy appearing ear canals and TMs bilaterally Nose: No congestion/rhinnorhea. Mouth/Throat: Mucous membranes are moist.  Oropharynx non-erythematous. Neck: No stridor.  No cervical spine tenderness to palpation. Cardiovascular: Normal rate, regular rhythm. Good peripheral circulation. Marked Systolic ejection murmur noted (consistent with aortic stenosis) Respiratory: Normal respiratory effort.  No retractions. Lungs CTAB. Gastrointestinal: Soft and nontender. No distention.  Musculoskeletal: 2+ bilateral lower extremity pitting edema. No gross deformities of extremities. Neurologic:Confused but very pleasant  No gross focal neurologic deficits are appreciated.  Skin:  Skin is warm, dry and intact. No rash noted.  ____________________________________________   LABS (all labs ordered are listed, but only abnormal results are displayed)  Labs Reviewed  BASIC METABOLIC PANEL - Abnormal; Notable for the following:       Result Value   Glucose, Bld 152 (*)    All other  components within normal limits  CBC - Abnormal; Notable for the following:    HCT 39.8 (*)    MCV 79.5 (*)    RDW 15.0 (*)    All other components within normal limits  FIBRIN DERIVATIVES D-DIMER ([facility] ONLY) - Abnormal; Notable for the following:    Fibrin derivatives D-dimer ([facility]) 1,801 (*)    All other components within normal limits  GLUCOSE, CAPILLARY - Abnormal; Notable for the following:    Glucose-Capillary 128 (*)    All other components within normal limits  GLUCOSE, CAPILLARY - Abnormal; Notable for the following:    Glucose-Capillary 109 (*)    All other components within normal limits  GLUCOSE, CAPILLARY - Abnormal; Notable for the following:    Glucose-Capillary 100 (*)    All other components within  normal limits  TROPONIN I  BRAIN NATRIURETIC PEPTIDE  TSH  TROPONIN I  TROPONIN I  HEMOGLOBIN A1C   ____________________________________________  EKG  ED ECG REPORT I, Hess[place] N Keyron Pokorski, the attending physician, personally viewed and interpreted this ECG.   Date: 05/18/2016  EKG Time: 12:18 AM  Rate: 108  Rhythm: Sinus tachycardia with right bundle-branch block  Axis: Normal  Intervals: Normal  ST&T Change: None  ____________________________________________  RADIOLOGY I, [place] N Burma Ketcher, personally viewed and evaluated these images (plain radiographs) as part of my medical decision making, as well as reviewing the written report by the radiologist.  CLINICAL DATA:  Acute onset of generalized chest pain and shortness of breath. Initial encounter.  EXAM: CHEST  2 VIEW  COMPARISON:  Chest radiograph performed 04/14/2016  FINDINGS: The lungs are well-aerated. Mild vascular congestion is noted. Mildly increased interstitial markings may reflect minimal interstitial edema. There is no evidence of pleural effusion or pneumothorax.  The heart is normal in size; the mediastinal contour is within normal limits. No acute osseous abnormalities are  seen.  IMPRESSION: Mild vascular congestion. Mildly increased interstitial markings may reflect minimal interstitial edema.   Electronically Signed   By: Roanna RaiderJeffery  Chang M.D.   On: 05/13/2016 00:39 Procedures     INITIAL IMPRESSION / ASSESSMENT AND PLAN / ED COURSE  Pertinent labs & imaging results that were available during my care of the patient were reviewed by me and considered in my medical decision making (see chart for details).  Given history physical exam concern for CHF exacerbation as such patient given Lasix 60 mg in the emergency Department with improvement of dyspnea however dyspnea persists.   Clinical Course     ____________________________________________  FINAL CLINICAL IMPRESSION(S) / ED DIAGNOSES  Final diagnoses:  SOB (shortness of breath)  Aortic stenosis CHF exacerbation   MEDICATIONS GIVEN DURING THIS VISIT:  Medications  morphine 2 MG/ML injection 2 mg (2 mg Intravenous Given 05/13/16 0047)  furosemide (LASIX) injection 60 mg (60 mg Intravenous Given 05/13/16 0147)  iopamidol (ISOVUE-370) 76 % injection 75 mL (75 mLs Intravenous Contrast Given 05/13/16 1139)     NEW OUTPATIENT MEDICATIONS STARTED DURING THIS VISIT:  Discharge Medication List as of 05/13/2016  1:23 PM      Discharge Medication List as of 05/13/2016  1:23 PM      Discharge Medication List as of 05/13/2016  1:23 PM       Note:  This document was prepared using Dragon voice recognition software and may include unintentional dictation errors.    Darci Currentandolph N Raoul Ciano, MD 05/18/16 [phone]

## 2016-05-20 ENCOUNTER — Ambulatory Visit: Payer: Medicare Other | Admitting: Family Medicine

## 2016-05-20 ENCOUNTER — Other Ambulatory Visit: Payer: Self-pay | Admitting: Nurse Practitioner

## 2016-05-20 NOTE — Telephone Encounter (Signed)
Metformin historical medication. Lasix refilled by Dr.Gollan. pts A1C 5.5 lab done 04/2016. Please advise?

## 2016-05-20 NOTE — Telephone Encounter (Signed)
Needs appt

## 2016-05-20 NOTE — Telephone Encounter (Signed)
Pt daughter called requesting a refill on furosemide (LASIX) 20 MG tablet and metFORMIN (GLUCOPHAGE) 500 MG tablet. Can we do a month supply? Please advise, thank you!  [facility] [place] - [id number]  Call pt [phone]  [phone]

## 2016-05-25 ENCOUNTER — Ambulatory Visit (INDEPENDENT_AMBULATORY_CARE_PROVIDER_SITE_OTHER): Payer: Medicare Other | Admitting: Family Medicine

## 2016-05-25 ENCOUNTER — Encounter: Payer: Self-pay | Admitting: Family Medicine

## 2016-05-25 VITALS — BP 114/61 | HR 79 | Temp 97.8°F | Resp 16 | Wt 284.0 lb

## 2016-05-25 DIAGNOSIS — I10 Essential (primary) hypertension: Secondary | ICD-10-CM | POA: Diagnosis not present

## 2016-05-25 DIAGNOSIS — E1159 Type 2 diabetes mellitus with other circulatory complications: Secondary | ICD-10-CM

## 2016-05-25 DIAGNOSIS — E785 Hyperlipidemia, unspecified: Secondary | ICD-10-CM

## 2016-05-25 DIAGNOSIS — I2510 Atherosclerotic heart disease of native coronary artery without angina pectoris: Secondary | ICD-10-CM | POA: Diagnosis not present

## 2016-05-25 MED ORDER — ATORVASTATIN CALCIUM 80 MG PO TABS: 80.0000 mg | ORAL_TABLET | Freq: Every day | ORAL | 3 refills | Status: DC

## 2016-05-25 NOTE — Patient Instructions (Signed)
Follow up in 6 months.  Take care  Dr. Charrise Lardner  

## 2016-05-25 NOTE — Assessment & Plan Note (Signed)
Has been very well controlled. Stopping Metformin. Will continue to watch closely.

## 2016-05-25 NOTE — Progress Notes (Signed)
Subjective:  Patient ID: [name], male    DOB: 27-May-1930  Age: 83 y.o. MRN: [id number]  CC: Hospital follow up  HPI:  83 year old male with morbid obesity, HTN, HLD, DM-2 presents for hospital follow up.  Chest pain  Recently presented to the ED on 12/28 for chest pain.  Was admitted briefly and had a negative cardiac workup.  Was discharged home in stable condition.  He's had no recurrence of chest pain. No shortness of breath. He states that he's doing well at this time. His only complaint today is knee pain.  Hypertension  At goal on metoprolol, Lisinopril, Torsemide/Lasix.  HLD  Most recent LDL was 81.  Currently on lipitor 40 mg daily.  Would like below 70.  DM-2  Last A1C was 5.5 (Dec 2017).  Currently on Metformin.  Will discuss stopping today given good control and advanced age.  Social Hx   Social History   Social History  . Marital status: Married    Spouse name: N/A  . Number of children: N/A  . Years of education: N/A   Occupational History  . Retired Teacher, music   Social History Main Topics  . Smoking status: Former Smoker    Packs/Seel: 1.50    Years: 20.00    Types: Cigarettes    Quit date: 02/22/1981  . Smokeless tobacco: Never Used  . Alcohol use No  . Drug use: No  . Sexual activity: Not Asked   Other Topics Concern  . None   Social History Narrative   Mr. Lavine lives in [place] with his wife. They have a son and daughter. He is retired from [employer]. He enjoys fishing.    Review of Systems  Respiratory: Negative for shortness of breath.   Cardiovascular: Negative for chest pain.  Musculoskeletal:       Knee pain.   Objective:  BP 114/61   Pulse 79   Temp 97.8 F (36.6 C) (Oral)   Resp 16   Wt 284 lb (128.8 kg)   SpO2 100%   BMI 39.61 kg/m   BP/Weight 05/25/2016 05/13/2016 04/14/2016  Systolic BP 114 105 150  Diastolic BP 61 40 51  Wt. (Lbs) 284 274.5 284  BMI 39.61  38.28 39.61   Physical Exam  Constitutional: He appears well-developed. No distress.  Cardiovascular: Normal rate and regular rhythm.   2-3/6 systolic murmur.  Pulmonary/Chest: Effort normal. He has no wheezes. He has no rales.  Neurological: He is alert.  Psychiatric: He has a normal mood and affect.  Vitals reviewed.  Lab Results  Component Value Date   WBC 10.1 05/13/2016   HGB 13.0 05/13/2016   HCT 39.8 (L) 05/13/2016   PLT 194 05/13/2016   GLUCOSE 152 (H) 05/13/2016   CHOL 144 01/21/2016   TRIG 58.0 01/21/2016   HDL 51.30 01/21/2016   LDLCALC 81 01/21/2016   ALT 18 12/19/2015   AST 21 12/19/2015   NA 140 05/13/2016   K 3.7 05/13/2016   CL 103 05/13/2016   CREATININE 0.93 05/13/2016   BUN 12 05/13/2016   CO2 31 05/13/2016   TSH 0.865 05/13/2016   INR 1.1 05/18/2013   HGBA1C 5.5 05/13/2016    Assessment & Plan:   Problem List Items Addressed This Visit    Hypertension    At goal. Continue current meds.      Relevant Medications   atorvastatin (LIPITOR) 80 MG tablet  Hyperlipidemia    Increasing lipitor to 80 mg daily to target LDL of <70.      Relevant Medications   atorvastatin (LIPITOR) 80 MG tablet   Diabetes mellitus (HCC)    Has been very well controlled. Stopping Metformin. Will continue to watch closely.      Relevant Medications   atorvastatin (LIPITOR) 80 MG tablet   Coronary artery disease, non-occlusive - Primary    No further chest pain. Cardiac work up negative. Continue Plavix, Statin, Beta blocker, ACEI.      Relevant Medications   atorvastatin (LIPITOR) 80 MG tablet      Meds ordered this encounter  Medications  . atorvastatin (LIPITOR) 80 MG tablet    Sig: Take 1 tablet (80 mg total) by mouth daily.    Dispense:  90 tablet    Refill:  3    Follow-up: Return in about 6 months (around 11/22/2016).  Everlene OtherJayce Sheryl Saintil DO [facility] [place]

## 2016-05-25 NOTE — Assessment & Plan Note (Signed)
At goal. Continue current meds.  

## 2016-05-25 NOTE — Assessment & Plan Note (Signed)
Increasing lipitor to 80 mg daily to target LDL of <70.

## 2016-05-25 NOTE — Progress Notes (Signed)
Pre visit review using our clinic review tool, if applicable. No additional management support is needed unless otherwise documented below in the visit note. 

## 2016-05-25 NOTE — Assessment & Plan Note (Signed)
No further chest pain. Cardiac work up negative. Continue Plavix, Statin, Beta blocker, ACEI.

## 2016-05-26 ENCOUNTER — Ambulatory Visit: Payer: Medicare Other | Admitting: Cardiovascular Disease

## 2016-05-31 ENCOUNTER — Other Ambulatory Visit: Payer: Self-pay | Admitting: Family Medicine

## 2016-05-31 MED ORDER — FUROSEMIDE 20 MG PO TABS: 20.0000 mg | ORAL_TABLET | Freq: Every day | ORAL | 3 refills | Status: DC

## 2016-05-31 NOTE — Telephone Encounter (Signed)
Pt daughter called and stated that pt still has not received Rx for furosemide (LASIX) 20 MG tablet. Please advise, thank you!  [facility] [place] - [phone]

## 2016-05-31 NOTE — Telephone Encounter (Signed)
Refilled 11/2015. Pt last seen 05/26/15. Please advise?

## 2016-06-08 ENCOUNTER — Encounter: Payer: Self-pay | Admitting: Cardiovascular Disease

## 2016-06-08 ENCOUNTER — Ambulatory Visit (INDEPENDENT_AMBULATORY_CARE_PROVIDER_SITE_OTHER): Payer: Medicare Other | Admitting: Cardiovascular Disease

## 2016-06-08 VITALS — BP 112/56 | HR 80 | Ht 71.0 in | Wt 274.0 lb

## 2016-06-08 DIAGNOSIS — I890 Lymphedema, not elsewhere classified: Secondary | ICD-10-CM

## 2016-06-08 DIAGNOSIS — I350 Nonrheumatic aortic (valve) stenosis: Secondary | ICD-10-CM | POA: Diagnosis not present

## 2016-06-08 DIAGNOSIS — E1159 Type 2 diabetes mellitus with other circulatory complications: Secondary | ICD-10-CM | POA: Diagnosis not present

## 2016-06-08 DIAGNOSIS — E782 Mixed hyperlipidemia: Secondary | ICD-10-CM

## 2016-06-08 DIAGNOSIS — R0602 Shortness of breath: Secondary | ICD-10-CM | POA: Diagnosis not present

## 2016-06-08 DIAGNOSIS — I2510 Atherosclerotic heart disease of native coronary artery without angina pectoris: Secondary | ICD-10-CM | POA: Diagnosis not present

## 2016-06-08 MED ORDER — TORSEMIDE 20 MG PO TABS: 20.0000 mg | ORAL_TABLET | Freq: Every day | ORAL | 6 refills | Status: DC | PRN

## 2016-06-08 MED ORDER — POTASSIUM CHLORIDE CRYS ER 10 MEQ PO TBCR: 10.0000 meq | EXTENDED_RELEASE_TABLET | Freq: Every day | ORAL | 3 refills | Status: DC

## 2016-06-08 MED ORDER — FUROSEMIDE 20 MG PO TABS: 20.0000 mg | ORAL_TABLET | Freq: Every day | ORAL | 3 refills | Status: DC

## 2016-06-08 NOTE — Progress Notes (Signed)
Cardiology Office Note  Date:  06/08/2016   ID:  [name], DOB 04-06-1931, MRN [id number]  PCP:  [name], DO   Chief Complaint  Patient presents with  . other    [facility] 12/28. Pt states everytime he eats he has to go to the bathroom. Reviewed meds with pt verbally.    HPI:  [name] is a very pleasant 83 year old gentleman with moderate to severe aortic valve stenosis, obesity, diabetes, smoking history for 30 years,  mild coronary artery disease by cardiac catheterization in 2004, normal ejection fraction with normal wall motion, minimal carotid arterial disease who presents for routine follow-up of his aortic valve stenosis Stop smoking 30 years ago chronic lower extremity swelling worse on the right and left, previous injury to the right lower extremity, previous ultrasound showing no DVT In the past has not wanted to wear compression hose  In follow-up today he reports that he feels well Chronic shortness of breath, feels it is stable Worse with hills and stairs Chronic lower extremity swelling bilateral Knee arthritis Chronic SOB,  R>L swelling Bad couple of months ago, better now, Denies having any significant change in palpitations No regular exercise program Chronic knee pain, walks with a cane  HBA1C 5.5  In hospital 12/28 for CP, [facility] CT chest was ok, showed heavily calcified aortic valve, coronary and aortic atherosclerosis TNT neg BNP 32 Cr 0.93,  Total chol 140 He was discharged on both Lasix and torsemide, reports that he is taking both  Echo 03/03/2016 Results below reviewed with him in detail Moderate to severe aortic valve stenosis, progression over the past 2 years  The estimated ejection fraction was in the range of 65% to 70%.  There was moderate to severe stenosis.  - Ascending aorta: The ascending aorta was mildly dilated. - Tricuspid valve: There was moderate regurgitation.   EKG on today's visit shows normal sinus rhythm  with right bundle branch block, rate 80 bpm, no change from prior EKGs  Other past medical history reviewed  seen in the ER 12/2015: knee pain, Set up appt ortho wife is in a nursing home, had a bilateral stroke.  Lower extremity venous Doppler reviewed with him from 05/08/2015 showing no DVT on the right  total cholesterol 132, LDL 73, hemoglobin A1c 5.9  Cardiac catheterization July 2004 showed 20% disease in the LAD, RCA and left circumflex Stress test in 2012 shows no significant ischemia, attenuation artifact in the septal wall  Vitamin D 13, hematocrit 42, creatinine 0.77, total cholesterol 157, HDL 54, LDL 89  PMH:   has a past medical history of Aortic atherosclerosis (HCC) (05/2013); Arrhythmia; Asthma; Colon polyp; Coronary artery disease; Diabetes mellitus without complication (HCC); GERD (gastroesophageal reflux disease); Heart murmur; Hiatal hernia; Hyperlipidemia; Hypertension; Moderate aortic stenosis; Obesity; Onychomycosis; and Osteoarthritis.  PSH:    Past Surgical History:  Procedure Laterality Date  . CARDIAC CATHETERIZATION  11/2002  . CARDIAC CATHETERIZATION  05/21/2013   showing 30% oLM, 30% D1, 20% mLCx, 30% pRCA; EF 60%, severe AS (mean gradient 28 mmHg, peak 26, area 0.9)  . CHOLECYSTECTOMY  2001  . TRANSESOPHAGEAL ECHOCARDIOGRAM  05/22/2013   Preserved EF, moderate AS (valve area by planimetry between 1.0-1.26 cm sq), moderate aortic arch and descending aortic atherosclerosis.   . TRANSTHORACIC ECHOCARDIOGRAM  05/19/2013   EF 60-65%, impaired diastolic function, mild LA dilatation, mild MR/AI/TR, mod AS, high normal RVSP (30.4 mmHg)    Current Outpatient Prescriptions  Medication Sig  Dispense Refill  . atorvastatin (LIPITOR) 80 MG tablet Take 1 tablet (80 mg total) by mouth daily. 90 tablet 3  . clopidogrel (PLAVIX) 75 MG tablet TAKE 1 TABLET DAILY 90 tablet 3  . lisinopril (PRINIVIL,ZESTRIL) 10 MG tablet TAKE 1 TABLET DAILY 90 tablet 3  . potassium  chloride (KLOR-CON M10) 10 MEQ tablet Take 1 tablet (10 mEq total) by mouth daily. 90 tablet 3  . TOPROL XL 25 MG 24 hr tablet TAKE 1 TABLET DAILY 90 tablet 3  . torsemide (DEMADEX) 20 MG tablet Take 1 tablet (20 mg total) by mouth daily as needed. 30 tablet 6  . furosemide (LASIX) 20 MG tablet Take 1 tablet (20 mg total) by mouth daily. 90 tablet 3   No current facility-administered medications for this visit.      Allergies:   Aspirin   Social History:  The patient  reports that he quit smoking about 35 years ago. His smoking use included Cigarettes. He has a 30.00 pack-year smoking history. He has never used smokeless tobacco. He reports that he does not drink alcohol or use drugs.   Family History:   family history includes Heart disease in his mother; Heart disease (age of onset: 33) in his father; Stroke in his brother.    Review of Systems: Review of Systems  Constitutional: Negative.   Respiratory: Negative.   Cardiovascular: Negative.   Gastrointestinal: Negative.   Musculoskeletal: Negative.   Neurological: Negative.   Psychiatric/Behavioral: Negative.   All other systems reviewed and are negative.    PHYSICAL EXAM: VS:  BP (!) 112/56 (BP Location: Left Arm, Patient Position: Sitting, Cuff Size: Normal)   Pulse 80   Ht 5\' 11"  (1.803 m)   Wt 274 lb (124.3 kg)   BMI 38.22 kg/m  , BMI Body mass index is 38.22 kg/m. GEN: Well nourished, well developed, in no acute distress , obese HEENT: normal  Neck: no JVD, carotid bruits, or masses Cardiac: RRR; III/VI SEM RSB,  No rubs, or gallops,no edema  Respiratory:  clear to auscultation bilaterally, normal work of breathing GI: soft, nontender, nondistended, + BS MS: no deformity or atrophy  Skin: warm and dry, no rash Neuro:  Strength and sensation are intact Psych: euthymic mood, full affect    Recent Labs: 12/19/2015: ALT 18 05/13/2016: B Natriuretic Peptide 32.0; BUN 12; Creatinine, Ser 0.93; Hemoglobin 13.0;  Platelets 194; Potassium 3.7; Sodium 140; TSH 0.865    Lipid Panel Lab Results  Component Value Date   CHOL 144 01/21/2016   HDL 51.30 01/21/2016   LDLCALC 81 01/21/2016   TRIG 58.0 01/21/2016      Wt Readings from Last 3 Encounters:  06/08/16 274 lb (124.3 kg)  05/25/16 284 lb (128.8 kg)  05/13/16 274 lb 8 oz (124.5 kg)       ASSESSMENT AND PLAN:  SOB (shortness of breath) - Plan: EKG 12-Lead, ECHOCARDIOGRAM COMPLETE Chronic shortness of breath likely secondary to morbid obesity, deconditioning. Exacerbated by aortic valve stenosis. He reports symptoms are stable, worse with hills and stairs   Aortic valve stenosis, etiology of cardiac valve disease unspecified - Plan: EKG 12-Lead, ECHOCARDIOGRAM COMPLETE  will plan for repeat echo later in 2018  Long discussion concerning various treatment options for his aortic valve stenosis, types of surgery   Morbid obesity (HCC) We have encouraged continued exercise, careful diet management in an effort to lose weight.  Type 2 diabetes mellitus with other circulatory complication, unspecified long term insulin use status (HCC)  Coronary artery disease, non-occlusive Currently with no symptoms of angina. No further workup at this time. Continue current medication regimen.  Mixed hyperlipidemia Cholesterol is at goal on the current lipid regimen. No changes to the medications were made.  Lymphedema  recommended compression hose We will provide details to vein and vascular clinic He may benefit from lymphedema compression pumps   Total encounter time more than 25 minutes  Greater than 50% was spent in counseling and coordination of care with the patient   Disposition:   F /U  6 months   Orders Placed This Encounter  Procedures  . EKG 12-Lead  . ECHOCARDIOGRAM COMPLETE     Signed, Dossie Arbour, M.D., Ph.D. 06/08/2016  [facility] [place], [place] [phone]

## 2016-06-08 NOTE — Patient Instructions (Addendum)
Medication Instructions:    Continue furosemide with potassium daily  If your mouth and skin gets dry, Skip the furosemide and potassium for a Kimmel or two  Only take torsemide for severe leg swelling,  takewith potassium  Labwork:  No new labs needed  Testing/Procedures:  Echo in 6 months for aortic valve stenosis   I recommend watching educational videos on topics of interest to you at:       www.goemmi.com  Enter code: HEARTCARE    Follow-Up: It was a pleasure seeing you in the office today. Please call us if you have new issues that need to be addressed before your next appt.  [phone]  Your physician wants you to follow-up in: 6 months.  You will receive a reminder letter in the mail two months in advance. If you don't receive a letter, please call our office to schedule the follow-up appointment.  If you need a refill on your cardiac medications before your next appointment, please call your pharmacy.    Echocardiogram An echocardiogram, or echocardiography, uses sound waves (ultrasound) to produce an image of your heart. The echocardiogram is simple, painless, obtained within a short period of time, and offers valuable information to your health care provider. The images from an echocardiogram can provide information such as:  Evidence of coronary artery disease (CAD).  Heart size.  Heart muscle function.  Heart valve function.  Aneurysm detection.  Evidence of a past heart attack.  Fluid buildup around the heart.  Heart muscle thickening.  Assess heart valve function. Tell a health care provider about:  Any allergies you have.  All medicines you are taking, including vitamins, herbs, eye drops, creams, and over-the-counter medicines.  Any problems you or family members have had with anesthetic medicines.  Any blood disorders you have.  Any surgeries you have had.  Any medical conditions you have.  Whether you are pregnant or may be  pregnant. What happens before the procedure? No special preparation is needed. Eat and drink normally. What happens during the procedure?  In order to produce an image of your heart, gel will be applied to your chest and a wand-like tool (transducer) will be moved over your chest. The gel will help transmit the sound waves from the transducer. The sound waves will harmlessly bounce off your heart to allow the heart images to be captured in real-time motion. These images will then be recorded.  You may need an IV to receive a medicine that improves the quality of the pictures. What happens after the procedure? You may return to your normal schedule including diet, activities, and medicines, unless your health care provider tells you otherwise. This information is not intended to replace advice given to you by your health care provider. Make sure you discuss any questions you have with your health care provider. Document Released: 04/30/2000 Document Revised: 12/20/2015 Document Reviewed: 01/08/2013 Elsevier Interactive Patient Education  2017 [employer]

## 2016-07-02 ENCOUNTER — Telehealth: Payer: Self-pay | Admitting: Cardiovascular Disease

## 2016-07-02 NOTE — Telephone Encounter (Signed)
Pt daughter called and needs to discuss pt medications. She is not sure which ones he should be taking.

## 2016-07-02 NOTE — Telephone Encounter (Signed)
No answer/No voicemail box is set up 

## 2016-07-02 NOTE — Telephone Encounter (Signed)
Spoke with patients daughter and reviewed all medications with her. She states that she had called and requested refills and [facility]. In the future for long term medications [facility] Let her know that I would make note and to call back when they need me to send in a new prescription. She was very pleasant and so appreciative for my time. Instructed her to call back if any further questions or problems.

## 2016-07-16 ENCOUNTER — Emergency Department: Payer: Medicare Other

## 2016-07-16 ENCOUNTER — Encounter: Payer: Self-pay | Admitting: Emergency Medicine

## 2016-07-16 ENCOUNTER — Emergency Department
Admission: EM | Admit: 2016-07-16 | Discharge: 2016-07-16 | Disposition: A | Discharge status: Home / Self Care | Payer: Medicare Other | Attending: Emergency Medicine | Admitting: Emergency Medicine

## 2016-07-16 DIAGNOSIS — I10 Essential (primary) hypertension: Secondary | ICD-10-CM | POA: Insufficient documentation

## 2016-07-16 DIAGNOSIS — R1013 Epigastric pain: Secondary | ICD-10-CM | POA: Diagnosis present

## 2016-07-16 DIAGNOSIS — E119 Type 2 diabetes mellitus without complications: Secondary | ICD-10-CM | POA: Diagnosis not present

## 2016-07-16 DIAGNOSIS — Z87891 Personal history of nicotine dependence: Secondary | ICD-10-CM | POA: Insufficient documentation

## 2016-07-16 DIAGNOSIS — J45909 Unspecified asthma, uncomplicated: Secondary | ICD-10-CM | POA: Diagnosis not present

## 2016-07-16 DIAGNOSIS — I2510 Atherosclerotic heart disease of native coronary artery without angina pectoris: Secondary | ICD-10-CM | POA: Diagnosis not present

## 2016-07-16 DIAGNOSIS — Z7984 Long term (current) use of oral hypoglycemic drugs: Secondary | ICD-10-CM | POA: Diagnosis not present

## 2016-07-16 DIAGNOSIS — Z79899 Other long term (current) drug therapy: Secondary | ICD-10-CM | POA: Insufficient documentation

## 2016-07-16 DIAGNOSIS — R1010 Upper abdominal pain, unspecified: Secondary | ICD-10-CM | POA: Insufficient documentation

## 2016-07-16 DIAGNOSIS — R079 Chest pain, unspecified: Secondary | ICD-10-CM | POA: Diagnosis not present

## 2016-07-16 LAB — HEPATIC FUNCTION PANEL
ALBUMIN: 3.5 g/dL (ref 3.5–5.0)
ALK PHOS: 74 U/L (ref 38–126)
ALT: 15 U/L — AB (ref 17–63)
AST: 21 U/L (ref 15–41)
Bilirubin, Direct: 0.2 mg/dL (ref 0.1–0.5)
Indirect Bilirubin: 0.9 mg/dL (ref 0.3–0.9)
Total Bilirubin: 1.1 mg/dL (ref 0.3–1.2)
Total Protein: 6.8 g/dL (ref 6.5–8.1)

## 2016-07-16 LAB — CBC
HEMATOCRIT: 37.2 % — AB (ref 40.0–52.0)
Hemoglobin: 12.2 g/dL — ABNORMAL LOW (ref 13.0–18.0)
MCH: 25.3 pg — AB (ref 26.0–34.0)
MCHC: 32.8 g/dL (ref 32.0–36.0)
MCV: 77.2 fL — AB (ref 80.0–100.0)
PLATELETS: 163 10*3/uL (ref 150–440)
RBC: 4.81 MIL/uL (ref 4.40–5.90)
RDW: 16.3 % — AB (ref 11.5–14.5)
WBC: 7.7 10*3/uL (ref 3.8–10.6)

## 2016-07-16 LAB — BASIC METABOLIC PANEL
Anion gap: 7 (ref 5–15)
BUN: 14 mg/dL (ref 6–20)
CHLORIDE: 103 mmol/L (ref 101–111)
CO2: 28 mmol/L (ref 22–32)
CREATININE: 0.87 mg/dL (ref 0.61–1.24)
Calcium: 8.9 mg/dL (ref 8.9–10.3)
GFR calc Af Amer: >60 mL/min (ref >60)
GFR calc non Af Amer: >60 mL/min (ref >60)
GLUCOSE: 117 mg/dL — AB (ref 65–99)
Potassium: 3.6 mmol/L (ref 3.5–5.1)
SODIUM: 138 mmol/L (ref 135–145)

## 2016-07-16 LAB — TROPONIN I
Troponin I: <0.03 ng/mL (ref <0.03)
Troponin I: <0.03 ng/mL (ref <0.03)

## 2016-07-16 NOTE — ED Provider Notes (Signed)
[facility] Emergency Department Provider Note   ____________________________________________   First MD Initiated Contact with Patient 07/16/16 1515     (approximate)  I have reviewed the triage vital signs and the nursing notes.   HISTORY  Chief Complaint Chest Pain    HPI [name] is a 83 y.o. male who reports he was sitting watching TV this morning and developed pain in the epigastric area. Persistent pain. Pain is mild to moderate not made at her or worse by anything including eating or activity not reproducible 1 palpation and in fact is gone at present   Past Medical History:  Diagnosis Date  . Aortic atherosclerosis (HCC) 05/2013   Per TEE  . Arrhythmia   . Asthma   . Colon polyp   . Coronary artery disease    a. Nonobstructive by 2004 cath b. Nonobstructive by 2015 cath in setting of NSTEMI  . Diabetes mellitus without complication (HCC)    a. Hgb A1C 5.9% 05/2013  . GERD (gastroesophageal reflux disease)   . Heart murmur   . Hiatal hernia   . Hyperlipidemia   . Hypertension   . Moderate aortic stenosis    a. echo 2013: EF 55%, nl wall motion, mild DD, mild LVH, trace MR, moderate AS with peak gradient of 55 mm Hg; b. TEE 05/2013: EF EF 60-65%, borderline LVH, mild MR/AI, mod AS with valve area 1.0-1.26; c. echo 10/2014: 55-60%, no RWMA, GR1DD, mod AS w/ mean gradient of 28 mm Hg     . Obesity   . Onychomycosis   . Osteoarthritis     Patient Active Problem List   Diagnosis Date Noted  . Lymphedema 06/08/2016  . Morbid obesity (HCC) 03/26/2015  . Coronary artery disease, non-occlusive 06/05/2013  . Hypertension 06/05/2013  . Hyperlipidemia 02/23/2012  . Diabetes mellitus (HCC) 02/23/2012  . Moderate aortic stenosis 02/23/2012    Past Surgical History:  Procedure Laterality Date  . CARDIAC CATHETERIZATION  11/2002  . CARDIAC CATHETERIZATION  05/21/2013   showing 30% oLM, 30% D1, 20% mLCx, 30% pRCA; EF 60%, severe AS (mean  gradient 28 mmHg, peak 26, area 0.9)  . CHOLECYSTECTOMY  2001  . TRANSESOPHAGEAL ECHOCARDIOGRAM  05/22/2013   Preserved EF, moderate AS (valve area by planimetry between 1.0-1.26 cm sq), moderate aortic arch and descending aortic atherosclerosis.   . TRANSTHORACIC ECHOCARDIOGRAM  05/19/2013   EF 60-65%, impaired diastolic function, mild LA dilatation, mild MR/AI/TR, mod AS, high normal RVSP (30.4 mmHg)    Prior to Admission medications   Medication Sig Start Date End Date Taking? Authorizing Provider  atorvastatin (LIPITOR) 80 MG tablet Take 1 tablet (80 mg total) by mouth daily. 05/25/16  Yes [name], DO  clopidogrel (PLAVIX) 75 MG tablet TAKE 1 TABLET DAILY 11/21/15  Yes Antonieta Ibaimothy J Gollan, MD  furosemide (LASIX) 20 MG tablet Take 1 tablet (20 mg total) by mouth daily. 06/08/16 06/08/17 Yes Antonieta Ibaimothy J Gollan, MD  metFORMIN (GLUCOPHAGE) 500 MG tablet Take 500 mg by mouth 2 (two) times daily with a meal.  07/11/13  Yes Historical Provider, MD  potassium chloride (KLOR-CON M10) 10 MEQ tablet Take 1 tablet (10 mEq total) by mouth daily. 06/08/16  Yes Antonieta Ibaimothy J Gollan, MD  TOPROL XL 25 MG 24 hr tablet TAKE 1 TABLET DAILY 11/21/15  Yes Antonieta Ibaimothy J Gollan, MD  torsemide (DEMADEX) 20 MG tablet Take 1 tablet (20 mg total) by mouth daily as needed. 06/08/16  Yes Antonieta Ibaimothy J Gollan, MD  Allergies Aspirin  Family History  Problem Relation Age of Onset  . Heart disease Mother   . Heart disease Father 72  . Stroke Brother     Social History Social History  Substance Use Topics  . Smoking status: Former Smoker    Packs/Michaelson: 1.50    Years: 20.00    Types: Cigarettes    Quit date: 02/22/1981  . Smokeless tobacco: Never Used  . Alcohol use No    Review of Systems Constitutional: No fever/chills Eyes: No visual changes. ENT: No sore throat. Cardiovascular: Denies chest pain. Respiratory: Denies shortness of breath. Gastrointestinal: No abdominal pain.  No nausea, no vomiting.  No diarrhea.  No  constipation. Genitourinary: Negative for dysuria. Musculoskeletal: Negative for back pain. Skin: Negative for rash. Neurological: Negative for headaches, focal weakness or numbness.  10-point ROS otherwise negative.  ____________________________________________   PHYSICAL EXAM:  VITAL SIGNS: ED Triage Vitals  Enc Vitals Group     BP 07/16/16 1212 (!) 110/50     Pulse Rate 07/16/16 1212 97     Resp 07/16/16 1212 20     Temp 07/16/16 1212 99.6 F (37.6 C)     Temp Source 07/16/16 1212 Oral     SpO2 07/16/16 1212 97 %     Weight 07/16/16 1212 285 lb (129.3 kg)     Height 07/16/16 1212 5\' 11"  (1.803 m)     Head Circumference --      Peak Flow --      Pain Score 07/16/16 1222 6     Pain Loc --      Pain Edu? --      Excl. in GC? --     Constitutional: Alert and oriented. Well appearing and in no acute distress. Eyes: Conjunctivae are normal. PERRL. EOMI. Head: Atraumatic. Nose: No congestion/rhinnorhea. Mouth/Throat: Mucous membranes are moist.  Oropharynx non-erythematous. Neck: No stridor.   Cardiovascular: Normal rate, regular rhythm. Grossly normal heart sounds.  Good peripheral circulation. Respiratory: Normal respiratory effort.  No retractions. Lungs CTAB. Gastrointestinal: Soft and nontender. No distention. No abdominal bruits. No CVA tenderness. Musculoskeletal: No lower extremity tenderness Bilateral edema edema.  No joint effusions. Skin:  Skin is warm, dry and intact. No rash noted. Psychiatric: Mood and affect are normal. Speech and behavior are normal.  ____________________________________________   LABS (all labs ordered are listed, but only abnormal results are displayed)  Labs Reviewed  BASIC METABOLIC PANEL - Abnormal; Notable for the following:       Result Value   Glucose, Bld 117 (*)    All other components within normal limits  CBC - Abnormal; Notable for the following:    Hemoglobin 12.2 (*)    HCT 37.2 (*)    MCV 77.2 (*)    MCH 25.3 (*)     RDW 16.3 (*)    All other components within normal limits  HEPATIC FUNCTION PANEL - Abnormal; Notable for the following:    ALT 15 (*)    All other components within normal limits  TROPONIN I  TROPONIN I   ____________________________________________  EKG  EKG read and interpreted by me shows sinus tachycardia rate of 101 normal axis right bundle branch block no acute changes EKG similar to one from January of this year ____________________________________________  RADIOLOGY  Study Result   CLINICAL DATA:  Chest pain  EXAM: CHEST  2 VIEW  COMPARISON:  05/13/2016  FINDINGS: The heart size and mediastinal contours are within normal limits. Both lungs are clear.  The visualized skeletal structures are unremarkable.  IMPRESSION: No active cardiopulmonary disease.   Electronically Signed   By: Marlan Palau M.D.   On: 07/16/2016 13:00    ____________________________________________   PROCEDURES  Procedure(s) performed:   Procedures  Critical Care performed:   ____________________________________________   INITIAL IMPRESSION / ASSESSMENT AND PLAN / ED COURSE  Pertinent labs & imaging results that were available during my care of the patient were reviewed by me and considered in my medical decision making (see chart for details).  On discharge patient feels well.      ____________________________________________   FINAL CLINICAL IMPRESSION(S) / ED DIAGNOSES  Final diagnoses:  Pain of upper abdomen      NEW MEDICATIONS STARTED DURING THIS VISIT:  Discharge Medication List as of 07/16/2016  5:35 PM       Note:  This document was prepared using Dragon voice recognition software and may include unintentional dictation errors.    Arnaldo Natal, MD 07/17/16 [phone]

## 2016-07-16 NOTE — ED Provider Notes (Signed)
[facility] Emergency Department Provider Note   ____________________________________________   First MD Initiated Contact with Patient 07/16/16 1515     (approximate)  I have reviewed the triage vital signs and the nursing notes.   HISTORY  Chief Complaint Chest Pain    HPI [name] is a 83 y.o. male who reports he was sitting watching TV this morning and developed pain in his epigastric area. It was just the pain it did not make him short of breath and nauseated sweaty it did not radiate anywhere resolved and then as he was getting up on the stretcher started again it does not seem to be related to exercise however or eating currently gone again.   Past Medical History:  Diagnosis Date  . Aortic atherosclerosis (HCC) 05/2013   Per TEE  . Arrhythmia   . Asthma   . Colon polyp   . Coronary artery disease    a. Nonobstructive by 2004 cath b. Nonobstructive by 2015 cath in setting of NSTEMI  . Diabetes mellitus without complication (HCC)    a. Hgb A1C 5.9% 05/2013  . GERD (gastroesophageal reflux disease)   . Heart murmur   . Hiatal hernia   . Hyperlipidemia   . Hypertension   . Moderate aortic stenosis    a. echo 2013: EF 55%, nl wall motion, mild DD, mild LVH, trace MR, moderate AS with peak gradient of 55 mm Hg; b. TEE 05/2013: EF EF 60-65%, borderline LVH, mild MR/AI, mod AS with valve area 1.0-1.26; c. echo 10/2014: 55-60%, no RWMA, GR1DD, mod AS w/ mean gradient of 28 mm Hg     . Obesity   . Onychomycosis   . Osteoarthritis     Patient Active Problem List   Diagnosis Date Noted  . Lymphedema 06/08/2016  . Morbid obesity (HCC) 03/26/2015  . Coronary artery disease, non-occlusive 06/05/2013  . Hypertension 06/05/2013  . Hyperlipidemia 02/23/2012  . Diabetes mellitus (HCC) 02/23/2012  . Moderate aortic stenosis 02/23/2012    Past Surgical History:  Procedure Laterality Date  . CARDIAC CATHETERIZATION  11/2002  . CARDIAC  CATHETERIZATION  05/21/2013   showing 30% oLM, 30% D1, 20% mLCx, 30% pRCA; EF 60%, severe AS (mean gradient 28 mmHg, peak 26, area 0.9)  . CHOLECYSTECTOMY  2001  . TRANSESOPHAGEAL ECHOCARDIOGRAM  05/22/2013   Preserved EF, moderate AS (valve area by planimetry between 1.0-1.26 cm sq), moderate aortic arch and descending aortic atherosclerosis.   . TRANSTHORACIC ECHOCARDIOGRAM  05/19/2013   EF 60-65%, impaired diastolic function, mild LA dilatation, mild MR/AI/TR, mod AS, high normal RVSP (30.4 mmHg)    Prior to Admission medications   Medication Sig Start Date End Date Taking? Authorizing Provider  atorvastatin (LIPITOR) 80 MG tablet Take 1 tablet (80 mg total) by mouth daily. 05/25/16  Yes Tommie Sams, DO  clopidogrel (PLAVIX) 75 MG tablet TAKE 1 TABLET DAILY 11/21/15  Yes Antonieta Iba, MD  furosemide (LASIX) 20 MG tablet Take 1 tablet (20 mg total) by mouth daily. 06/08/16 06/08/17 Yes Antonieta Iba, MD  metFORMIN (GLUCOPHAGE) 500 MG tablet Take 500 mg by mouth 2 (two) times daily with a meal.  07/11/13  Yes Historical Provider, MD  potassium chloride (KLOR-CON M10) 10 MEQ tablet Take 1 tablet (10 mEq total) by mouth daily. 06/08/16  Yes Antonieta Iba, MD  TOPROL XL 25 MG 24 hr tablet TAKE 1 TABLET DAILY 11/21/15  Yes Antonieta Iba, MD  torsemide (DEMADEX) 20 MG tablet  Take 1 tablet (20 mg total) by mouth daily as needed. 06/08/16  Yes Antonieta Iba, MD    Allergies Aspirin  Family History  Problem Relation Age of Onset  . Heart disease Mother   . Heart disease Father 20  . Stroke Brother     Social History Social History  Substance Use Topics  . Smoking status: Former Smoker    Packs/Bossler: 1.50    Years: 20.00    Types: Cigarettes    Quit date: 02/22/1981  . Smokeless tobacco: Never Used  . Alcohol use No    Review of Systems Constitutional: No fever/chills Eyes: No visual changes. ENT: No sore throat. Cardiovascular:See history of present illness. Respiratory: Denies  shortness of breath. Gastrointestinal: No abdominal pain.  No nausea, no vomiting.  No diarrhea.  No constipation. Genitourinary: Negative for dysuria. Musculoskeletal: Negative for back pain. Skin: Negative for rash.   10-point ROS otherwise negative.  ____________________________________________   PHYSICAL EXAM:  VITAL SIGNS: ED Triage Vitals  Enc Vitals Group     BP 07/16/16 1212 (!) 110/50     Pulse Rate 07/16/16 1212 97     Resp 07/16/16 1212 20     Temp 07/16/16 1212 99.6 F (37.6 C)     Temp Source 07/16/16 1212 Oral     SpO2 07/16/16 1212 97 %     Weight 07/16/16 1212 285 lb (129.3 kg)     Height 07/16/16 1212 5\' 11"  (1.803 m)     Head Circumference --      Peak Flow --      Pain Score 07/16/16 1222 6     Pain Loc --      Pain Edu? --      Excl. in GC? --     Constitutional: Alert and oriented. Well appearing and in no acute distress. Eyes: Conjunctivae are normal. PERRL. EOMI. Head: Atraumatic. Nose: No congestion/rhinnorhea. Mouth/Throat: Mucous membranes are moist.  Oropharynx non-erythematous. Neck: No stridor.  Cardiovascular: Normal rate, regular rhythm. Grossly normal heart sounds.  Good peripheral circulation. Respiratory: Normal respiratory effort.  No retractions. Lungs CTAB. Gastrointestinal: Soft and nontender. No distention. No abdominal bruits. No CVA tenderness. Musculoskeletal: No lower extremity tenderness Bilateral edema.  No joint effusions. Neurologic:  Normal speech and language. No gross focal neurologic deficits are appreciated. No gait instability. Skin:  Skin is warm, dry and intact. No rash noted. Psychiatric: Mood and affect are normal. Speech and behavior are normal.  ____________________________________________   LABS (all labs ordered are listed, but only abnormal results are displayed)  Labs Reviewed  BASIC METABOLIC PANEL - Abnormal; Notable for the following:       Result Value   Glucose, Bld 117 (*)    All other  components within normal limits  CBC - Abnormal; Notable for the following:    Hemoglobin 12.2 (*)    HCT 37.2 (*)    MCV 77.2 (*)    MCH 25.3 (*)    RDW 16.3 (*)    All other components within normal limits  HEPATIC FUNCTION PANEL - Abnormal; Notable for the following:    ALT 15 (*)    All other components within normal limits  TROPONIN I  TROPONIN I   ____________________________________________  EKG  EKG read and interpreted by me shows sinus tachycardia rate of 101 normal axis right bundle branch block EKG is similar to one from January of this year. ____________________________________________  RADIOLOGY  Study Result   CLINICAL DATA:  Chest  pain  EXAM: CHEST  2 VIEW  COMPARISON:  05/13/2016  FINDINGS: The heart size and mediastinal contours are within normal limits. Both lungs are clear. The visualized skeletal structures are unremarkable.  IMPRESSION: No active cardiopulmonary disease.   Electronically Signed   By: Marlan Palauharles  Clark M.D.   On: 07/16/2016 13:00    ____________________________________________   PROCEDURES  Procedure(s) performed:   Procedures  Critical Care performed:   ____________________________________________   INITIAL IMPRESSION / ASSESSMENT AND PLAN / ED COURSE  Pertinent labs & imaging results that were available during my care of the patient were reviewed by me and considered in my medical decision making (see chart for details).        ____________________________________________   FINAL CLINICAL IMPRESSION(S) / ED DIAGNOSES  Final diagnoses:  Pain of upper abdomen      NEW MEDICATIONS STARTED DURING THIS VISIT:  New Prescriptions   No medications on file     Note:  This document was prepared using Dragon voice recognition software and may include unintentional dictation errors.    Arnaldo NatalPaul F Nolene Rocks, MD 07/16/16 [phone]

## 2016-07-16 NOTE — ED Notes (Signed)

## 2016-07-16 NOTE — ED Notes (Signed)
[name], friend of the pt will call to check on the patient; if we can't a hold of anyone in the pt's family, we can call Jeffery to come pick up the pt. His number is [phone]

## 2016-07-16 NOTE — ED Triage Notes (Addendum)
Pt with mid to left chest pain that started on waking this am. Pt denies heart hx or pain like this before.  Pt with documented hx CAD. Denies sob, weakness. 6/10 pain now. Pt a/o, distracted by wallet in triage.

## 2016-07-16 NOTE — Discharge Instructions (Signed)
Please follow-up with Dr. Darrold JunkerParaschos, the cardiologist just to make sure this is nothing to do with your heart. Please return if it comes on it gets worse again.

## 2016-10-01 ENCOUNTER — Telehealth: Payer: Self-pay

## 2016-10-01 NOTE — Telephone Encounter (Signed)
Patient walked into the office and c/o swelling in bilateral lower extremities.  Last OV was in January with PCP and Cardiology, was given additional torsemide to take for increased edema in extremities.  Patient vitals were 126/58, heart rate 82, temp 94..8, resp 14, and weight 276.2lbs.  Patient is SOB with exertion, but this is chronic, leg swelling chronic also. Today right leg is worse today, 2+ pitting edema to the knee region, left leg 1-2+ pitting to the mid calf region.  Legs are both cool to touch and only painful with extreme pushing on them, negative homans' sign.  Patient tends to sit for long periods of time and doesn't elevate lower extremities, doesn't wear stockings, and didn't understand that he was given extra fluid pill from cardiology to assist with swelling.  After discussion with PCP, advised patient to take torsemide for the next three days with his scheduled furosemide and then return on Monday for a follow up on his condition with PCP. Also advised to elevate lower extremities while sitting as much as possible to asisst with edema. Patient and driver agreed to plan and will return on Monday at scheduled time to see PCP.

## 2016-10-04 ENCOUNTER — Ambulatory Visit (INDEPENDENT_AMBULATORY_CARE_PROVIDER_SITE_OTHER): Payer: Medicare Other | Admitting: Family Medicine

## 2016-10-04 ENCOUNTER — Encounter: Payer: Self-pay | Admitting: Family Medicine

## 2016-10-04 VITALS — BP 118/48 | HR 64 | Temp 98.8°F | Resp 16 | Wt 278.5 lb

## 2016-10-04 DIAGNOSIS — I2510 Atherosclerotic heart disease of native coronary artery without angina pectoris: Secondary | ICD-10-CM

## 2016-10-04 DIAGNOSIS — E782 Mixed hyperlipidemia: Secondary | ICD-10-CM

## 2016-10-04 DIAGNOSIS — R600 Localized edema: Secondary | ICD-10-CM | POA: Diagnosis not present

## 2016-10-04 DIAGNOSIS — D649 Anemia, unspecified: Secondary | ICD-10-CM

## 2016-10-04 DIAGNOSIS — E1159 Type 2 diabetes mellitus with other circulatory complications: Secondary | ICD-10-CM | POA: Diagnosis not present

## 2016-10-04 DIAGNOSIS — I872 Venous insufficiency (chronic) (peripheral): Secondary | ICD-10-CM | POA: Insufficient documentation

## 2016-10-04 DIAGNOSIS — I350 Nonrheumatic aortic (valve) stenosis: Secondary | ICD-10-CM

## 2016-10-04 LAB — COMPREHENSIVE METABOLIC PANEL
ALBUMIN: 3.4 g/dL — AB (ref 3.5–5.2)
ALK PHOS: 66 U/L (ref 39–117)
ALT: 7 U/L (ref 0–53)
AST: 11 U/L (ref 0–37)
BILIRUBIN TOTAL: 0.8 mg/dL (ref 0.2–1.2)
BUN: 13 mg/dL (ref 6–23)
CALCIUM: 8.9 mg/dL (ref 8.4–10.5)
CO2: 30 mEq/L (ref 19–32)
CREATININE: 0.74 mg/dL (ref 0.40–1.50)
Chloride: 108 mEq/L (ref 96–112)
GFR: 129.65 mL/min (ref >60.00)
Glucose, Bld: 109 mg/dL — ABNORMAL HIGH (ref 70–99)
Potassium: 3.7 mEq/L (ref 3.5–5.1)
SODIUM: 142 meq/L (ref 135–145)
TOTAL PROTEIN: 6.2 g/dL (ref 6.0–8.3)

## 2016-10-04 LAB — CBC
HCT: 33 % — ABNORMAL LOW (ref 39.0–52.0)
HEMOGLOBIN: 10.7 g/dL — AB (ref 13.0–17.0)
MCHC: 32.3 g/dL (ref 30.0–36.0)
MCV: 78.2 fl (ref 78.0–100.0)
PLATELETS: 179 10*3/uL (ref 150.0–400.0)
RBC: 4.21 Mil/uL — AB (ref 4.22–5.81)
RDW: 15.7 % — ABNORMAL HIGH (ref 11.5–15.5)
WBC: 5.4 10*3/uL (ref 4.0–10.5)

## 2016-10-04 LAB — LIPID PANEL
Cholesterol: 118 mg/dL (ref 0–200)
HDL: 42.8 mg/dL (ref >39.00)
LDL Cholesterol: 64 mg/dL (ref 0–99)
NonHDL: 74.93
Total CHOL/HDL Ratio: 3
Triglycerides: 57 mg/dL (ref 0.0–149.0)
VLDL: 11.4 mg/dL (ref 0.0–40.0)

## 2016-10-04 LAB — BRAIN NATRIURETIC PEPTIDE: PRO B NATRI PEPTIDE: 105 pg/mL — AB (ref 0.0–100.0)

## 2016-10-04 LAB — HEMOGLOBIN A1C: Hgb A1c MFr Bld: 5.8 % (ref 4.6–6.5)

## 2016-10-04 NOTE — Progress Notes (Signed)
Pre visit review using our clinic review tool, if applicable. No additional management support is needed unless otherwise documented below in the visit note. 

## 2016-10-04 NOTE — Assessment & Plan Note (Signed)
Established problem, worsening. Likely multifactorial in nature: Morbid obesity, inactivity/venous stasis, possible component of CHF. Well-appearing today. Obtaining laboratory studies including BNP. Arranging echo. Continue Lasix. May increase pending laboratory results.

## 2016-10-04 NOTE — Progress Notes (Signed)
Subjective:  Patient ID: [name], male    DOB: 02/25/1931  Age: 83 y.o. MRN: [id number]  CC: Worsening LE edema  HPI:  83 year old male with moderate to severe aortic stenosis, morbid obesity, hypertension, hyperlipidemia, DM 2, nonocclusive CAD presents with the above complaint.  Patient reports a two-week history of increasing lower extremity edema. He denies any associated shortness of breath. He has shortness of breath with exertion at baseline. He is not very active and sits the majority of the [place]. Eats poorly. No recent chest pain. No reported inciting factor. No known exacerbating factors. Patient endorses compliance with Lasix. He has not taken torsemide as he was instructed to do so. No other associated symptoms. No other complaints at this time.  Social Hx   Social History   Social History  . Marital status: Married    Spouse name: N/A  . Number of children: N/A  . Years of education: N/A   Occupational History  . Retired Teacher, [employer]      Career military   Social History Main Topics  . Smoking status: Former Smoker    Packs/Bilodeau: 1.50    Years: 20.00    Types: Cigarettes    Quit date: 02/22/1981  . Smokeless tobacco: Never Used  . Alcohol use No  . Drug use: No  . Sexual activity: Not Asked   Other Topics Concern  . None   Social History Narrative   [name] lives in [place] with his wife. They have a son and daughter. He is retired from [employer]. He enjoys fishing.    Review of Systems  Respiratory: Positive for shortness of breath.   Cardiovascular: Positive for leg swelling.   Objective:  BP (!) 118/48 (BP Location: Left Arm, Patient Position: Sitting, Cuff Size: Large)   Pulse 64   Temp 98.8 F (37.1 C) (Oral)   Resp 16   Wt 278 lb 8 oz (126.3 kg)   SpO2 96%   BMI 38.84 kg/m   BP/Weight 10/04/2016 07/16/2016 06/08/2016  Systolic BP 118 119 112  Diastolic BP 48 50 56  Wt. (Lbs) 278.5 285 274  BMI 38.84 39.75 38.22    Physical Exam  Constitutional: He appears well-developed. No distress.  Cardiovascular: Normal rate and regular rhythm.   3/6 systolic murmur. 2-3 + pitting LE edema. Extends to the thigh.  Pulmonary/Chest: Effort normal and breath sounds normal.  Neurological: He is alert.  Psychiatric: He has a normal mood and affect.  Vitals reviewed.   Lab Results  Component Value Date   WBC 7.7 07/16/2016   HGB 12.2 (L) 07/16/2016   HCT 37.2 (L) 07/16/2016   PLT 163 07/16/2016   GLUCOSE 117 (H) 07/16/2016   CHOL 144 01/21/2016   TRIG 58.0 01/21/2016   HDL 51.30 01/21/2016   LDLCALC 81 01/21/2016   ALT 15 (L) 07/16/2016   AST 21 07/16/2016   NA 138 07/16/2016   K 3.6 07/16/2016   CL 103 07/16/2016   CREATININE 0.87 07/16/2016   BUN 14 07/16/2016   CO2 28 07/16/2016   TSH 0.865 05/13/2016   INR 1.1 05/18/2013   HGBA1C 5.5 05/13/2016    Assessment & Plan:   Problem List Items Addressed This Visit    Moderate aortic stenosis   Relevant Orders   ECHOCARDIOGRAM COMPLETE   Hyperlipidemia   Relevant Orders   Lipid panel   Diabetes mellitus (HCC)   Relevant Orders   Hemoglobin A1c   Comprehensive metabolic  panel   Bilateral leg edema - Primary    Established problem, worsening. Likely multifactorial in nature: Morbid obesity, inactivity/venous stasis, possible component of CHF. Well-appearing today. Obtaining laboratory studies including BNP. Arranging echo. Continue Lasix. May increase pending laboratory results.      Relevant Orders   B Nat Peptide    Other Visit Diagnoses    Anemia, unspecified type       Relevant Orders   CBC     Follow-up: Return in about 3 months (around 01/04/2017).  Everlene Other DO [facility]

## 2016-10-04 NOTE — Patient Instructions (Signed)
Continue your meds.  Once your lab results return we can consider increasing the lasix.  Be active. Healthy, low salt diet.  Take care  Dr. Adriana Simasook

## 2016-10-12 ENCOUNTER — Other Ambulatory Visit: Payer: Self-pay | Admitting: Family Medicine

## 2016-10-12 ENCOUNTER — Telehealth: Payer: Self-pay | Admitting: Radiology

## 2016-10-12 DIAGNOSIS — D649 Anemia, unspecified: Secondary | ICD-10-CM

## 2016-10-12 NOTE — Telephone Encounter (Signed)
PT came in and dropped off stool ifob card. Please place future order. Thank you.

## 2016-10-13 ENCOUNTER — Other Ambulatory Visit (INDEPENDENT_AMBULATORY_CARE_PROVIDER_SITE_OTHER): Payer: Medicare Other

## 2016-10-13 DIAGNOSIS — D649 Anemia, unspecified: Secondary | ICD-10-CM

## 2016-10-13 LAB — FECAL OCCULT BLOOD, IMMUNOCHEMICAL: Fecal Occult Bld: NEGATIVE

## 2016-10-14 ENCOUNTER — Other Ambulatory Visit: Payer: Self-pay | Admitting: Family Medicine

## 2016-10-14 DIAGNOSIS — D649 Anemia, unspecified: Secondary | ICD-10-CM

## 2016-10-14 NOTE — Progress Notes (Signed)
r 

## 2016-10-18 ENCOUNTER — Other Ambulatory Visit (INDEPENDENT_AMBULATORY_CARE_PROVIDER_SITE_OTHER): Payer: Medicare Other

## 2016-10-18 DIAGNOSIS — D649 Anemia, unspecified: Secondary | ICD-10-CM | POA: Diagnosis not present

## 2016-10-19 ENCOUNTER — Other Ambulatory Visit: Payer: Self-pay | Admitting: Family Medicine

## 2016-10-19 LAB — IRON,TIBC AND FERRITIN PANEL
%SAT: 8 % — ABNORMAL LOW (ref 15–60)
Ferritin: 12 ng/mL — ABNORMAL LOW (ref 20–380)
Iron: 25 ug/dL — ABNORMAL LOW (ref 50–180)
TIBC: 304 ug/dL (ref 250–425)

## 2016-10-19 MED ORDER — FERROUS SULFATE 325 (65 FE) MG PO TBEC: 325.0000 mg | DELAYED_RELEASE_TABLET | Freq: Two times a day (BID) | ORAL | 3 refills | Status: DC

## 2016-11-10 ENCOUNTER — Ambulatory Visit
Admission: RE | Admit: 2016-11-10 | Discharge: 2016-11-10 | Disposition: A | Discharge status: Home / Self Care | Payer: Medicare Other | Source: Ambulatory Visit | Attending: Family Medicine | Admitting: Family Medicine

## 2016-11-10 DIAGNOSIS — K219 Gastro-esophageal reflux disease without esophagitis: Secondary | ICD-10-CM | POA: Diagnosis not present

## 2016-11-10 DIAGNOSIS — I351 Nonrheumatic aortic (valve) insufficiency: Secondary | ICD-10-CM | POA: Diagnosis not present

## 2016-11-10 DIAGNOSIS — I350 Nonrheumatic aortic (valve) stenosis: Secondary | ICD-10-CM | POA: Insufficient documentation

## 2016-11-10 DIAGNOSIS — I2510 Atherosclerotic heart disease of native coronary artery without angina pectoris: Secondary | ICD-10-CM | POA: Insufficient documentation

## 2016-11-10 DIAGNOSIS — I10 Essential (primary) hypertension: Secondary | ICD-10-CM | POA: Diagnosis not present

## 2016-11-10 DIAGNOSIS — I371 Nonrheumatic pulmonary valve insufficiency: Secondary | ICD-10-CM | POA: Insufficient documentation

## 2016-11-10 DIAGNOSIS — E119 Type 2 diabetes mellitus without complications: Secondary | ICD-10-CM | POA: Diagnosis not present

## 2016-11-10 DIAGNOSIS — I252 Old myocardial infarction: Secondary | ICD-10-CM | POA: Diagnosis not present

## 2016-11-10 NOTE — Progress Notes (Signed)
*  PRELIMINARY RESULTS* Echocardiogram 2D Echocardiogram has been performed.  Cristela BlueHege, Charline Hoskinson 11/10/2016, 10:04 AM

## 2016-11-25 ENCOUNTER — Ambulatory Visit (INDEPENDENT_AMBULATORY_CARE_PROVIDER_SITE_OTHER): Payer: Medicare Other | Admitting: Family Medicine

## 2016-11-25 ENCOUNTER — Encounter: Payer: Self-pay | Admitting: Family Medicine

## 2016-11-25 VITALS — BP 110/50 | HR 72 | Temp 98.4°F | Resp 16 | Ht 71.0 in | Wt 275.8 lb

## 2016-11-25 DIAGNOSIS — D509 Iron deficiency anemia, unspecified: Secondary | ICD-10-CM

## 2016-11-25 DIAGNOSIS — R600 Localized edema: Secondary | ICD-10-CM | POA: Diagnosis not present

## 2016-11-25 DIAGNOSIS — E611 Iron deficiency: Secondary | ICD-10-CM | POA: Diagnosis not present

## 2016-11-25 DIAGNOSIS — I2510 Atherosclerotic heart disease of native coronary artery without angina pectoris: Secondary | ICD-10-CM

## 2016-11-25 DIAGNOSIS — I10 Essential (primary) hypertension: Secondary | ICD-10-CM | POA: Diagnosis not present

## 2016-11-25 LAB — CBC
HCT: 35.5 % — ABNORMAL LOW (ref 39.0–52.0)
Hemoglobin: 11.5 g/dL — ABNORMAL LOW (ref 13.0–17.0)
MCHC: 32.5 g/dL (ref 30.0–36.0)
MCV: 79 fl (ref 78.0–100.0)
Platelets: 164 10*3/uL (ref 150.0–400.0)
RBC: 4.49 Mil/uL (ref 4.22–5.81)
RDW: 17.5 % — AB (ref 11.5–15.5)
WBC: 5.8 10*3/uL (ref 4.0–10.5)

## 2016-11-25 MED ORDER — ATORVASTATIN CALCIUM 80 MG PO TABS: 80.0000 mg | ORAL_TABLET | Freq: Every day | ORAL | 3 refills | Status: DC

## 2016-11-25 NOTE — Assessment & Plan Note (Signed)
At goal on lisinopril, metoprolol, Lasix.

## 2016-11-25 NOTE — Patient Instructions (Addendum)
Consider compression stockings.  Call with orthopedist about your knee.   Follow up in 3 months.  Take care  Dr. Adriana Simasook

## 2016-11-25 NOTE — Assessment & Plan Note (Signed)
Worsening. Multifactorial: Sedentary, diastolic dysfunction, venous stasis. He's currently on Lasix. Needs compression stockings. Would benefit from Foot LockerUnna boot. Patient does not want to proceed with compression therapy. Advised activity. Continue Lasix.

## 2016-11-25 NOTE — Progress Notes (Signed)
Subjective:  Patient ID: [name], male    DOB: 1931-03-23  Age: 83 y.o. MRN: [id number]  CC: Follow up  HPI:  83 year old male with CAD, hypertension, aortic stenosis and aortic regurg, DM 2, hyperlipidemia, morbid obesity presents for follow-up.  Continues to have severe lower extremity edema bilaterally. Patient is an Event organiser. Takes Lasix regularly.  Recently found to be iron deficient. Hemoglobin 10.7. Tolerating iron. Needs stool card to assess for underlying blood loss.  Shortness of breath is at baseline. No complex chest pain. Diabetes is stable. He is now off his metformin.  Hypertension stable on lisinopril, Toprol-XL, and Lasix.  Social Hx   Social History   Social History  . Marital status: Married    Spouse name: N/A  . Number of children: N/A  . Years of education: N/A   Occupational History  . Retired Teacher, music   Social History Main Topics  . Smoking status: Former Smoker    Packs/[place]: 1.50    Years: 20.00    Types: Cigarettes    Quit date: 02/22/1981  . Smokeless tobacco: Never Used  . Alcohol use No  . Drug use: No  . Sexual activity: Not Asked   Other Topics Concern  . None   Social History Narrative   [name] lives in [place] with his wife. They have a son and daughter. He is retired from [employer]. He enjoys fishing.    Review of Systems  Cardiovascular: Positive for leg swelling.  Gastrointestinal: Negative for blood in stool.  Musculoskeletal: Positive for arthralgias.   Objective:  BP (!) 110/50   Pulse 72   Temp 98.4 F (36.9 C) (Oral)   Resp 16   Ht 5\' 11"  (1.803 m)   Wt 275 lb 12.8 oz (125.1 kg)   SpO2 95%   BMI 38.47 kg/m   BP/Weight 11/25/2016 10/04/2016 07/16/2016  Systolic BP 110 118 119  Diastolic BP 50 48 50  Wt. (Lbs) 275.8 278.5 285  BMI 38.47 38.84 39.75   Physical Exam  Constitutional: He is oriented to person, place, and time.  Morbidly obese male no acute  distress.  HENT:  Head: Normocephalic and atraumatic.  Eyes: Conjunctivae are normal. No scleral icterus.  Cardiovascular: Normal rate and regular rhythm.   3/6 systolic murmur. 3+ pitting lower extremity edema bilaterally, blistering noted. No current open wounds. He does have a healing wound with eschar on the right lower extremity.  Pulmonary/Chest: Effort normal. No respiratory distress. He has no wheezes. He has no rales.  Neurological: He is alert and oriented to person, place, and time.  Psychiatric: He has a normal mood and affect.  Vitals reviewed.   Lab Results  Component Value Date   WBC 5.4 10/04/2016   HGB 10.7 (L) 10/04/2016   HCT 33.0 (L) 10/04/2016   PLT 179.0 10/04/2016   GLUCOSE 109 (H) 10/04/2016   CHOL 118 10/04/2016   TRIG 57.0 10/04/2016   HDL 42.80 10/04/2016   LDLCALC 64 10/04/2016   ALT 7 10/04/2016   AST 11 10/04/2016   NA 142 10/04/2016   K 3.7 10/04/2016   CL 108 10/04/2016   CREATININE 0.74 10/04/2016   BUN 13 10/04/2016   CO2 30 10/04/2016   TSH 0.865 05/13/2016   INR 1.1 05/18/2013   HGBA1C 5.8 10/04/2016    Assessment & Plan:   Problem List Items Addressed This Visit      Cardiovascular and  Mediastinum   Hypertension - Primary    At goal on lisinopril, metoprolol, Lasix.      Relevant Medications   lisinopril (PRINIVIL,ZESTRIL) 10 MG tablet   atorvastatin (LIPITOR) 80 MG tablet     Other   Bilateral leg edema    Worsening. Multifactorial: Sedentary, diastolic dysfunction, venous stasis. He's currently on Lasix. Needs compression stockings. Would benefit from [employer]ocker[employer]na boot. Patient does not want to proceed with compression therapy. Advised activity. Continue Lasix.      Iron deficiency anemia    CBC today. Continue iron. Sending home with stool cards.      Relevant Orders   CBC   Fecal occult blood, imunochemical      Meds ordered this encounter  Medications  . lisinopril (PRINIVIL,ZESTRIL) 10 MG tablet    Sig: Take by  mouth.  Marland Kitchen. atorvastatin (LIPITOR) 80 MG tablet    Sig: Take 1 tablet (80 mg total) by mouth daily.    Dispense:  90 tablet    Refill:  3   Follow-up: Return in about 3 months (around 02/25/2017).  Everlene OtherJayce Libbi Towner DO [facility]

## 2016-11-25 NOTE — Assessment & Plan Note (Signed)
CBC today. Continue iron. Sending home with stool cards.

## 2016-11-26 ENCOUNTER — Other Ambulatory Visit: Payer: Medicare Other

## 2016-12-20 ENCOUNTER — Encounter: Payer: Self-pay | Admitting: Family Medicine

## 2017-01-04 ENCOUNTER — Telehealth: Payer: Self-pay | Admitting: *Deleted

## 2017-01-04 MED ORDER — LISINOPRIL 10 MG PO TABS: 10.0000 mg | ORAL_TABLET | Freq: Every day | ORAL | 3 refills | Status: DC

## 2017-01-04 MED ORDER — ATORVASTATIN CALCIUM 80 MG PO TABS: 80.0000 mg | ORAL_TABLET | Freq: Every day | ORAL | 3 refills | Status: DC

## 2017-01-04 MED ORDER — POTASSIUM CHLORIDE CRYS ER 10 MEQ PO TBCR: 10.0000 meq | EXTENDED_RELEASE_TABLET | Freq: Every day | ORAL | 3 refills | Status: DC

## 2017-01-04 MED ORDER — METOPROLOL SUCCINATE ER 25 MG PO TB24: 25.0000 mg | ORAL_TABLET | Freq: Every day | ORAL | 3 refills | Status: DC

## 2017-01-04 MED ORDER — FUROSEMIDE 20 MG PO TABS: 20.0000 mg | ORAL_TABLET | Freq: Every day | ORAL | 3 refills | Status: DC

## 2017-01-04 MED ORDER — FERROUS SULFATE 325 (65 FE) MG PO TBEC: 325.0000 mg | DELAYED_RELEASE_TABLET | Freq: Two times a day (BID) | ORAL | 3 refills | Status: DC

## 2017-01-04 MED ORDER — CLOPIDOGREL BISULFATE 75 MG PO TABS: 75.0000 mg | ORAL_TABLET | Freq: Every day | ORAL | 3 refills | Status: DC

## 2017-01-04 NOTE — Telephone Encounter (Signed)
Patient came into office with 22 bottles of medication 5 empty the rest were in various stages of full. Patient confused and wanted to know how he was suppose to be taking his medication. All except one bottle of medication was expired. Called [facility] and spoke with pharmacist and verified medications on file with pharmacy and called in [facility] supply advised patient to pick up today and wrote down a completed a print out of each medication for patient and how medication to be taken. Advised PCP of plan and to set patient up for visit with Pharmacist in office in 3 weeks to go over medications again. Called [facility] supply to express script and advised patient to bring with him to visit with pharmacist and not to open until appointment . Patient stated he would pickup medication from [facility] pharmacy and would also keep appointment with pharmacist.  When nurse ask patient how he was taking medication he said he took one from each bottle, some medications were duplicate of others ask these also patient stated yes. Expired medications were taken from patient and disposed of By Olegario Messier and Kenney Houseman. FYI

## 2017-01-31 ENCOUNTER — Ambulatory Visit (INDEPENDENT_AMBULATORY_CARE_PROVIDER_SITE_OTHER): Payer: Medicare Other | Admitting: Pharmacist

## 2017-01-31 VITALS — Wt 278.8 lb

## 2017-01-31 DIAGNOSIS — D509 Iron deficiency anemia, unspecified: Secondary | ICD-10-CM

## 2017-01-31 DIAGNOSIS — Z79899 Other long term (current) drug therapy: Secondary | ICD-10-CM

## 2017-01-31 NOTE — Progress Notes (Signed)
Care was provided under my supervision. I agree with the management as indicated in the note.  Detrick Dani DO  

## 2017-01-31 NOTE — Patient Instructions (Signed)
Thank you for coming to see [place] today.   I have put three days worth of pills in the green bottles. Take those until gone and then throw the green bottles away. From then on, you will take medicines out of the orange bottles sent from [facility] (the [place]   The only medicine that the [place] did not send you is the ferrous sulfate (red iron pills). Keep taking those - 1 pill twice a Mac[place]. I will have that prescription transferred to the [facility] on [address]. This is one of the three preferred pharmacies with your insurance.   Call me if you have any questions! [phone].

## 2017-01-31 NOTE — Progress Notes (Signed)
Saw patient as a follow up to nurse visit on 01/04/2017 for medication management.   Patient presents with his friend, ambulating slowly with assistance of a cane.   Patient brings with him two bags of medication - one bag of Rx bottles from [facility], the other from the [place]. Patient had 0-3 days' worth of pills in each of the rx bottles from [facility] and full bottles from the [place].   With the patient's permission, I filled Elly [place] (green) bottles with 3 days' supply of each medication. I instructed patient to take meds from from green bottles until gone, then switch to [place] bottles. Only medication not sent from [place] is ferrous sulfate. I will have this medication transferred to one of the patient's preferred pharmacies. No discrepancies noted between med list and pill bottles. Patient verbalized understanding.    Allena Katz, Pharm.D. PGY2 Ambulatory Care Pharmacy Resident Phone: [phone]

## 2017-02-25 ENCOUNTER — Ambulatory Visit: Payer: Medicare Other | Admitting: Family Medicine

## 2017-03-02 ENCOUNTER — Ambulatory Visit (INDEPENDENT_AMBULATORY_CARE_PROVIDER_SITE_OTHER): Payer: Medicare Other | Admitting: Family Medicine

## 2017-03-02 ENCOUNTER — Encounter: Payer: Self-pay | Admitting: Family Medicine

## 2017-03-02 VITALS — BP 136/58 | HR 75 | Temp 98.4°F | Wt 276.8 lb

## 2017-03-02 DIAGNOSIS — Z23 Encounter for immunization: Secondary | ICD-10-CM | POA: Diagnosis not present

## 2017-03-02 DIAGNOSIS — I872 Venous insufficiency (chronic) (peripheral): Secondary | ICD-10-CM | POA: Diagnosis not present

## 2017-03-02 DIAGNOSIS — D509 Iron deficiency anemia, unspecified: Secondary | ICD-10-CM

## 2017-03-02 DIAGNOSIS — I10 Essential (primary) hypertension: Secondary | ICD-10-CM

## 2017-03-02 DIAGNOSIS — I2510 Atherosclerotic heart disease of native coronary artery without angina pectoris: Secondary | ICD-10-CM | POA: Diagnosis not present

## 2017-03-02 DIAGNOSIS — R600 Localized edema: Secondary | ICD-10-CM | POA: Diagnosis not present

## 2017-03-02 LAB — CBC
HEMATOCRIT: 37.8 % — AB (ref 39.0–52.0)
HEMOGLOBIN: 12.2 g/dL — AB (ref 13.0–17.0)
MCHC: 32.3 g/dL (ref 30.0–36.0)
MCV: 80.4 fl (ref 78.0–100.0)
PLATELETS: 181 10*3/uL (ref 150.0–400.0)
RBC: 4.7 Mil/uL (ref 4.22–5.81)
RDW: 15.4 % (ref 11.5–15.5)
WBC: 6.4 10*3/uL (ref 4.0–10.5)

## 2017-03-02 LAB — COMPREHENSIVE METABOLIC PANEL
ALBUMIN: 3.6 g/dL (ref 3.5–5.2)
ALT: 13 U/L (ref 0–53)
AST: 19 U/L (ref 0–37)
Alkaline Phosphatase: 78 U/L (ref 39–117)
BUN: 17 mg/dL (ref 6–23)
CHLORIDE: 104 meq/L (ref 96–112)
CO2: 31 meq/L (ref 19–32)
Calcium: 9 mg/dL (ref 8.4–10.5)
Creatinine, Ser: 0.88 mg/dL (ref 0.40–1.50)
GFR: 106.05 mL/min (ref >60.00)
GLUCOSE: 100 mg/dL — AB (ref 70–99)
POTASSIUM: 4 meq/L (ref 3.5–5.1)
Sodium: 141 mEq/L (ref 135–145)
Total Bilirubin: 1.2 mg/dL (ref 0.2–1.2)
Total Protein: 6.7 g/dL (ref 6.0–8.3)

## 2017-03-02 LAB — TSH: TSH: 1.45 u[IU]/mL (ref 0.35–4.50)

## 2017-03-02 NOTE — Progress Notes (Signed)
Tommi Rumps, MD Phone: [phone]  [name] is a 83 y.o. male who presents today for follow-up. Patient is a poor historian.  Hypertension: Not checking at home. Taking lisinopril and metoprolol. No chest pain or shortness of breath. Does have edema that appears to be chronic.  Patient notes lower extremity edema slightly worse than usual. Notes right leg greater than left leg. Notes right leg has gotten slightly less swollen. Appears to have had multiple ultrasounds of the right leg with the last over a year ago to evaluate for DVT. Both were negative. He notes no dyspnea. No orthopnea. No PND. No fevers. No history of DVT or blood clot. No chest pain. Reports there is a rash on his legs that he states is new though it does appears to have been documented previously with blistering. Notes the rash came on after somebody started him on a red pill. Swelling and rash has improved to some degree after he was placed back on Lasix.  Patient does have history of iron deficiency anemia. Notes he is not taking the iron supplement anymore. No bleeding. Did undergo FOBT testing that was negative.  PMH: Former smoker   ROS see history of present illness  Objective  Physical Exam Vitals:   03/02/17 1335  BP: (!) 136/58  Pulse: 75  Temp: 98.4 F (36.9 C)  SpO2: 95%    BP Readings from Last 3 Encounters:  03/02/17 (!) 136/58  11/25/16 (!) 110/50  10/04/16 (!) 118/48   Wt Readings from Last 3 Encounters:  03/02/17 276 lb 12.8 oz (125.6 kg)  01/31/17 278 lb 12.8 oz (126.5 kg)  11/25/16 275 lb 12.8 oz (125.1 kg)    Physical Exam  Constitutional: No distress.  Cardiovascular: Normal rate and regular rhythm.   Murmur (3/6 systolic murmur) heard. Pulmonary/Chest: Effort normal and breath sounds normal.  Musculoskeletal:  Bilateral lower extremity edema right greater than left, right calf measures 54.5 cm, left calf measures 50.5 cm, patient does have evidence of blistering and  thickening of his skin bilateral lower extremities with hyperkeratotic skin specifically in the anterior right lower extremity, there is no erythema, no warmth, no tenderness, no calf tenderness or cords noted  Neurological: He is alert.  Skin: Skin is warm and dry. He is not diaphoretic.     Assessment/Plan: Please see individual problem list.  Coronary artery disease, non-occlusive Asymptomatic. Continue current regimen.  Hypertension Well-controlled. Continue current regimen.  Bilateral leg edema Appears to be a chronic issue. Possibly worsened previously though possibly slightly improved. No apparent signs of infection. Suspect multifactorial cause with sedentary nature, venous insufficiency, and possible diastolic dysfunction contributing. We discussed with patient obtaining an ultrasound or a d-dimer given that his right leg has become larger compared to the left. Patient declined this workup and advised that he would continue to monitor given that it has improved some. Discussed that if it worsens or develops any new symptoms or chest pain or shortness of breath he needs to be evaluated. We'll refer to vascular surgery to consider further interventions for the likely venous insufficiency and stasis dermatitis. Given return precautions.  Iron deficiency anemia Stool cards previously negative. No longer on iron. Check CBC.   Orders Placed This Encounter  Procedures  . Flu vaccine HIGH DOSE PF  . Comp Met (CMET)  . TSH  . CBC  . Ambulatory referral to Vascular Surgery    Referral Priority:   Routine    Referral Type:   Surgical  Referral Reason:   Specialty Services Required    Requested Specialty:   Vascular Surgery    Number of Visits Requested:   [place], MD [place]

## 2017-03-02 NOTE — Patient Instructions (Signed)
Nice to see you. If you change your mind regarding the ultrasound of your right leg please let us know.  if you develop worsening swelling, Chest pain, shortness of breath, pain in your legs, or any new or changing symptoms please seek medical attention immediately.

## 2017-03-02 NOTE — Assessment & Plan Note (Signed)
Well-controlled.  Continue current regimen. 

## 2017-03-02 NOTE — Assessment & Plan Note (Signed)
Asymptomatic.  Continue current regimen. 

## 2017-03-02 NOTE — Assessment & Plan Note (Signed)
Stool cards previously negative. No longer on iron. Check CBC.

## 2017-03-02 NOTE — Assessment & Plan Note (Signed)
Appears to be a chronic issue. Possibly worsened previously though possibly slightly improved. No apparent signs of infection. Suspect multifactorial cause with sedentary nature, venous insufficiency, and possible diastolic dysfunction contributing. We discussed with patient obtaining an ultrasound or a d-dimer given that his right leg has become larger compared to the left. Patient declined this workup and advised that he would continue to monitor given that it has improved some. Discussed that if it worsens or develops any new symptoms or chest pain or shortness of breath he needs to be evaluated. We'll refer to vascular surgery to consider further interventions for the likely venous insufficiency and stasis dermatitis. Given return precautions.

## 2017-03-24 ENCOUNTER — Encounter (INDEPENDENT_AMBULATORY_CARE_PROVIDER_SITE_OTHER): Payer: Self-pay | Admitting: Vascular Surgery

## 2017-04-04 ENCOUNTER — Encounter (INDEPENDENT_AMBULATORY_CARE_PROVIDER_SITE_OTHER): Payer: Self-pay

## 2017-04-04 ENCOUNTER — Ambulatory Visit (INDEPENDENT_AMBULATORY_CARE_PROVIDER_SITE_OTHER): Payer: Medicare Other | Admitting: Vascular Surgery

## 2017-04-04 ENCOUNTER — Encounter (INDEPENDENT_AMBULATORY_CARE_PROVIDER_SITE_OTHER): Payer: Self-pay | Admitting: Vascular Surgery

## 2017-04-04 VITALS — BP 127/55 | HR 71 | Resp 19 | Ht 70.0 in | Wt 278.0 lb

## 2017-04-04 DIAGNOSIS — I2510 Atherosclerotic heart disease of native coronary artery without angina pectoris: Secondary | ICD-10-CM | POA: Diagnosis not present

## 2017-04-04 DIAGNOSIS — I872 Venous insufficiency (chronic) (peripheral): Secondary | ICD-10-CM | POA: Diagnosis not present

## 2017-04-04 DIAGNOSIS — I83813 Varicose veins of bilateral lower extremities with pain: Secondary | ICD-10-CM | POA: Diagnosis not present

## 2017-04-04 DIAGNOSIS — I10 Essential (primary) hypertension: Secondary | ICD-10-CM

## 2017-04-05 ENCOUNTER — Ambulatory Visit (INDEPENDENT_AMBULATORY_CARE_PROVIDER_SITE_OTHER): Payer: Medicare Other

## 2017-04-05 VITALS — BP 128/62 | HR 86 | Temp 98.3°F | Resp 16 | Ht 67.0 in | Wt 277.0 lb

## 2017-04-05 DIAGNOSIS — Z1331 Encounter for screening for depression: Secondary | ICD-10-CM

## 2017-04-05 DIAGNOSIS — Z0000 Encounter for general adult medical examination without abnormal findings: Secondary | ICD-10-CM | POA: Diagnosis not present

## 2017-04-05 NOTE — Patient Instructions (Addendum)
  [name] , Thank you for taking time to come for your Medicare Wellness Visit. I appreciate your ongoing commitment to your health goals. Please review the following plan we discussed and let me know if I can assist you in the future.   Follow up with Dr. Birdie SonsSonnenberg as needed.    Bring a copy of your Health Care Power of Attorney and/or Living Will to be scanned into chart once completed.  Have a great St[place]!  These are the goals we discussed: Goals    . Keep Feet Elevated      Wear ted hose and brace as directed, chair exercises as demostrated       This is a list of the screening recommended for you and due dates:  Health Maintenance  Topic Date Due  . Eye exam for diabetics  12/12/1940  . Complete foot exam   01/20/2017  . Tetanus Vaccine  04/05/2018*  . Pneumonia vaccines (1 of 2 - PCV13) 04/05/2018*  . Hemoglobin A1C  04/06/2017  . Flu Shot  Completed  *Topic was postponed. The date shown is not the original due date.

## 2017-04-05 NOTE — Progress Notes (Signed)
Subjective:   [name] is a 83 y.o. male who presents for a subsequent Medicare Annual Wellness Visit.  Review of Systems    No ROS.  Medicare Wellness Visit. Additional risk factors are reflected in the social history.  Cardiac Risk Factors include: advanced age (>58men, >55 women);hypertension;male gender;obesity (BMI >30kg/m2);diabetes mellitus     Objective:    Today's Vitals   04/05/17 1303 04/05/17 1308  BP: 128/62   Pulse: 86   Resp: 16   Temp: 98.3 F (36.8 C)   TempSrc: Oral   SpO2: 96%   Weight: 277 lb (125.6 kg)   Height: 5\' 7"  (1.702 m)   PainSc:  0-No pain   Body mass index is 43.38 kg/m.   Current Medications (verified) Outpatient Encounter Medications as of 04/05/2017  Medication Sig  . atorvastatin (LIPITOR) 80 MG tablet Take 1 tablet (80 mg total) by mouth daily.  . clopidogrel (PLAVIX) 75 MG tablet Take 1 tablet (75 mg total) by mouth daily.  . ferrous sulfate 325 (65 FE) MG EC tablet Take 1 tablet (325 mg total) by mouth 2 (two) times daily.  . furosemide (LASIX) 20 MG tablet Take 1 tablet (20 mg total) by mouth daily.  Marland Kitchen lisinopril (PRINIVIL,ZESTRIL) 10 MG tablet Take 1 tablet (10 mg total) by mouth daily.  . metoprolol succinate (TOPROL XL) 25 MG 24 hr tablet Take 1 tablet (25 mg total) by mouth daily.  . potassium chloride (KLOR-CON M10) 10 MEQ tablet Take 1 tablet (10 mEq total) by mouth daily.   No facility-administered encounter medications on file as of 04/05/2017.     Allergies (verified) Aspirin   History: Past Medical History:  Diagnosis Date  . Aortic atherosclerosis (HCC) 05/2013   Per TEE  . Arrhythmia   . Asthma   . Colon polyp   . Coronary artery disease    a. Nonobstructive by 2004 cath b. Nonobstructive by 2015 cath in setting of NSTEMI  . Diabetes mellitus without complication (HCC)    a. Hgb A1C 5.9% 05/2013  . GERD (gastroesophageal reflux disease)   . Heart murmur   . Hiatal hernia   . Hyperlipidemia   .  Hypertension   . Moderate aortic stenosis    a. echo 2013: EF 55%, nl wall motion, mild DD, mild LVH, trace MR, moderate AS with peak gradient of 55 mm Hg; b. TEE 05/2013: EF EF 60-65%, borderline LVH, mild MR/AI, mod AS with valve area 1.0-1.26; c. echo 10/2014: 55-60%, no RWMA, GR1DD, mod AS w/ mean gradient of 28 mm Hg     . Obesity   . Onychomycosis   . Osteoarthritis    Past Surgical History:  Procedure Laterality Date  . CARDIAC CATHETERIZATION  11/2002  . CARDIAC CATHETERIZATION  05/21/2013   showing 30% oLM, 30% D1, 20% mLCx, 30% pRCA; EF 60%, severe AS (mean gradient 28 mmHg, peak 26, area 0.9)  . CHOLECYSTECTOMY  2001  . TRANSESOPHAGEAL ECHOCARDIOGRAM  05/22/2013   Preserved EF, moderate AS (valve area by planimetry between 1.0-1.26 cm sq), moderate aortic arch and descending aortic atherosclerosis.   . TRANSTHORACIC ECHOCARDIOGRAM  05/19/2013   EF 60-65%, impaired diastolic function, mild LA dilatation, mild MR/AI/TR, mod AS, high normal RVSP (30.4 mmHg)   Family History  Problem Relation Age of Onset  . Heart disease Mother   . Heart disease Father 34  . Stroke Brother    Social History   Occupational History  . Occupation: Retired Chief of Staff  Sergeant     Comment: [employer]  Tobacco Use  . Smoking status: Former Smoker    Packs/Maggart: 1.50    Years: 20.00    Pack years: 30.00    Types: Cigarettes    Last attempt to quit: 02/22/1981    Years since quitting: 36.1  . Smokeless tobacco: Never Used  Substance and Sexual Activity  . Alcohol use: No  . Drug use: No  . Sexual activity: Not on file    Tobacco Counseling Counseling given: Not Answered   Activities of Daily Living In your present state of health, do you have any difficulty performing the following activities: 04/05/2017 05/13/2016  Hearing? Malvin JohnsY Y  Comment HOH -  Vision? N N  Difficulty concentrating or making decisions? Y N  Walking or climbing stairs? Y Y  Dressing or bathing? Y N  Doing  errands, shopping? Y N  Preparing Food and eating ? N -  Using the Toilet? N -  In the past six months, have you accidently leaked urine? N -  Do you have problems with loss of bowel control? N -  Managing your Medications? N -  Managing your Finances? N -  Housekeeping or managing your Housekeeping? N -  Some recent data might be hidden    Immunizations and Health Maintenance Immunization History  Administered Date(s) Administered  . Influenza Split 02/24/2015  . Influenza, High Dose Seasonal PF 01/21/2016, 03/02/2017  . Influenza-Unspecified 01/15/2013   Health Maintenance Due  Topic Date Due  . OPHTHALMOLOGY EXAM  12/12/1940  . FOOT EXAM  01/20/2017    Patient Care Team: Glori LuisSonnenberg, Eric G, MD as PCP - General (Family Medicine) Mariah MillingGollan, Tollie Pizzaimothy J, MD as Consulting Physician (Cardiology)  Indicate any recent Medical Services you may have received from other than [facility] providers in the past year (date may be approximate).     Assessment:   This is a routine wellness examination for [name]. The goal of the wellness visit is to assist the patient how to close the gaps in care and create a preventative care plan for the patient. Presents with daughter, HIPAA compliant.  The roster of all physicians providing medical care to patient is listed in the Snapshot section of the chart.  Taking calcium VIT D as appropriate/Osteoporosis risk reviewed.    Safety issues reviewed; Smoke and carbon monoxide detectors in the home. No firearms in the home.  Wears seatbelts when driving or riding with others. Patient does wear sunscreen or protective clothing when in direct sunlight. No violence in the home.  Depression- PHQ 2 &9 complete.  No signs/symptoms or verbal communication regarding little pleasure in doing things, feeling down, depressed or hopeless. No changes in sleeping, energy, eating, concentrating.  No thoughts of self harm or harm towards others.  Time spent on this topic is  8 minutes.   Patient is alert, normal appearance, oriented to person/place/and time.   No new identified risk were noted.  No failures at ADL's or IADL's.  Ambulates with cane.  BMI- discussed the importance of a healthy diet, water intake and the benefits of aerobic exercise. Educational material provided.   24 hour diet recall: Low carb foods Breakfast: Biscuit and gravy Lunch: None Dinner: ViacomLean meat, green vegetable Daily fluid intake: 1 cups of caffeine, 6 cups of water  Dental- top dentures.  Last OV 2016.  Eye- Visual acuity not assessed per patient preference.  Wears corrective lenses.  Sleep patterns- Sleeps 5 hours at night.  Naps  during the [place].  Patient Concerns: Awaiting feedback form PCP after visit with Dr. Gilda CreaseSchnier.  Encouraged to wear ted hose, brace and keep feet elevated as directed.  Hearing/Vision screen Hearing Screening Comments: Patient has difficulty hearing conversational tones. Does not wear hearing aids. Audiology testing deferred per patient preference. Vision Screening Comments: Visual acuity not assessed per patient preference. He does wear glasses when reading and driving.  Dietary issues and exercise activities discussed: Current Exercise Habits: The patient does not participate in regular exercise at present  Goals    . Keep Feet Elevated      Wear ted hose and brace as directed, chair exercises as demostrated      Depression Screen PHQ 2/9 Scores 04/05/2017 05/25/2016  PHQ - 2 Score 0 0  PHQ- 9 Score 0 -    Fall Risk Fall Risk  04/05/2017 05/25/2016  Falls in the past year? No No    Cognitive Function: MMSE - Mini Mental State Exam 04/05/2017  Not completed: Refused        Screening Tests Health Maintenance  Topic Date Due  . OPHTHALMOLOGY EXAM  12/12/1940  . FOOT EXAM  01/20/2017  . TETANUS/TDAP  04/05/2018 (Originally 12/12/1949)  . PNA vac Low Risk Adult (1 of 2 - PCV13) 04/05/2018 (Originally 12/11/1997)  . HEMOGLOBIN A1C   04/06/2017  . INFLUENZA VACCINE  Completed      Plan:    End of life planning; Advanced aging; Advanced directives discussed.  No HCPOA/Living Will.  Additional information provided to help them start the conversation with family.  Copy of HCPOA/Living Will requested upon completion. Time spent on this topic is 20 minutes.  I have personally reviewed and noted the following in the patient's chart:   . Medical and social history . Use of alcohol, tobacco or illicit drugs  . Current medications and supplements . Functional ability and status . Nutritional status . Physical activity . Advanced directives . List of other physicians . Hospitalizations, surgeries, and ER visits in previous 12 months . Vitals . Screenings to include cognitive, depression, and falls . Referrals and appointments  In addition, I have reviewed and discussed with patient certain preventive protocols, quality metrics, and best practice recommendations. A written personalized care plan for preventive services as well as general preventive health recommendations were provided to patient.     OBrien-Blaney, Rylen Swindler L, LPN   16/10/960411/20/2018      I have reviewed the above information and agree with above.   Duncan Dulleresa Tullo, MD

## 2017-04-09 NOTE — Progress Notes (Signed)
  I have reviewed the above information and agree with above.   Metha Kolasa, MD 

## 2017-04-10 ENCOUNTER — Encounter (INDEPENDENT_AMBULATORY_CARE_PROVIDER_SITE_OTHER): Payer: Self-pay | Admitting: Vascular Surgery

## 2017-04-10 DIAGNOSIS — I83813 Varicose veins of bilateral lower extremities with pain: Secondary | ICD-10-CM | POA: Insufficient documentation

## 2017-04-10 DIAGNOSIS — I872 Venous insufficiency (chronic) (peripheral): Secondary | ICD-10-CM | POA: Insufficient documentation

## 2017-04-10 NOTE — Progress Notes (Signed)
MRN : [id number]  [name] is a 83 y.o. ([birth year]) male who presents with chief complaint of  Chief Complaint  Patient presents with  . New Patient (Initial Visit)    Venous insuffiency  .  History of Present Illness:The patient is seen for evaluation of symptomatic varicose veins. The patient relates burning and stinging which worsened steadily throughout the course of the [place], particularly with standing. The patient also notes an aching and throbbing pain over the varicosities, particularly with prolonged dependent positions. The symptoms are significantly improved with elevation.  The patient also notes that during hot weather the symptoms are greatly intensified. The patient states the pain from the varicose veins interferes with work, daily exercise, shopping and household maintenance. At this point, the symptoms are persistent and severe enough that they're having a negative impact on lifestyle and are interfering with daily activities.  There is no history of DVT, PE or superficial thrombophlebitis. There is no history of ulceration or hemorrhage. The patient denies a significant family history of varicose veins.  The patient has not worn graduated compression in the past. At the present time the patient has not been using over-the-counter analgesics. There is no history of prior surgical intervention or sclerotherapy.    No outpatient medications have been marked as taking for the 04/04/17 encounter (Office Visit) with Gilda CreaseSchnier, Latina CraverGregory G, MD.    Past Medical History:  Diagnosis Date  . Aortic atherosclerosis (HCC) 05/2013   Per TEE  . Arrhythmia   . Asthma   . Colon polyp   . Coronary artery disease    a. Nonobstructive by 2004 cath b. Nonobstructive by 2015 cath in setting of NSTEMI  . Diabetes mellitus without complication (HCC)    a. Hgb A1C 5.9% 05/2013  . GERD (gastroesophageal reflux disease)   . Heart murmur   . Hiatal hernia   . Hyperlipidemia   .  Hypertension   . Moderate aortic stenosis    a. echo 2013: EF 55%, nl wall motion, mild DD, mild LVH, trace MR, moderate AS with peak gradient of 55 mm Hg; b. TEE 05/2013: EF EF 60-65%, borderline LVH, mild MR/AI, mod AS with valve area 1.0-1.26; c. echo 10/2014: 55-60%, no RWMA, GR1DD, mod AS w/ mean gradient of 28 mm Hg     . Obesity   . Onychomycosis   . Osteoarthritis     Past Surgical History:  Procedure Laterality Date  . CARDIAC CATHETERIZATION  11/2002  . CARDIAC CATHETERIZATION  05/21/2013   showing 30% oLM, 30% D1, 20% mLCx, 30% pRCA; EF 60%, severe AS (mean gradient 28 mmHg, peak 26, area 0.9)  . CHOLECYSTECTOMY  2001  . TRANSESOPHAGEAL ECHOCARDIOGRAM  05/22/2013   Preserved EF, moderate AS (valve area by planimetry between 1.0-1.26 cm sq), moderate aortic arch and descending aortic atherosclerosis.   . TRANSTHORACIC ECHOCARDIOGRAM  05/19/2013   EF 60-65%, impaired diastolic function, mild LA dilatation, mild MR/AI/TR, mod AS, high normal RVSP (30.4 mmHg)    Social History Social History   Tobacco Use  . Smoking status: Former Smoker    Packs/Duce: 1.50    Years: 20.00    Pack years: 30.00    Types: Cigarettes    Last attempt to quit: 02/22/1981    Years since quitting: 36.1  . Smokeless tobacco: Never Used  Substance Use Topics  . Alcohol use: No  . Drug use: No    Family History Family History  Problem Relation Age of Onset  .  Heart disease Mother   . Heart disease Father 15  . Stroke Brother     Allergies  Allergen Reactions  . Aspirin Rash and Other (See Comments)    Reaction: Trouble breathing      REVIEW OF SYSTEMS (Negative unless checked)  Constitutional: [] Weight loss  [] Fever  [] Chills Cardiac: [] Chest pain   [] Chest pressure   [] Palpitations   [] Shortness of breath when laying flat   [] Shortness of breath with exertion. Vascular:  [] Pain in legs with walking   [x] Pain in legs with standing  [] History of DVT   [] Phlebitis   [x] Swelling in legs    [x] Varicose veins   [] Non-healing ulcers Pulmonary:   [] Uses home oxygen   [] Productive cough   [] Hemoptysis   [] Wheeze  [] COPD   [] Asthma Neurologic:  [] Dizziness   [] Seizures   [] History of stroke   [] History of TIA  [] Aphasia   [] Vissual changes   [] Weakness or numbness in arm   [] Weakness or numbness in leg Musculoskeletal:   [] Joint swelling   [] Joint pain   [] Low back pain Hematologic:  [] Easy bruising  [] Easy bleeding   [] Hypercoagulable state   [] Anemic Gastrointestinal:  [] Diarrhea   [] Vomiting  [] Gastroesophageal reflux/heartburn   [] Difficulty swallowing. Genitourinary:  [] Chronic kidney disease   [] Difficult urination  [] Frequent urination   [] Blood in urine Skin:  [] Rashes   [] Ulcers  Psychological:  [] History of anxiety   []  History of major depression.  Physical Examination  Vitals:   04/04/17 1408  BP: (!) 127/55  Pulse: 71  Resp: 19  Weight: 126.1 kg (278 lb)  Height: 5\' 10"  (1.778 m)   Body mass index is 39.89 kg/m. Gen: WD/WN, NAD Head: [place]/AT, No temporalis wasting.  Ear/Nose/Throat: Hearing grossly intact, nares w/o erythema or drainage Eyes: PER, EOMI, sclera nonicteric.  Neck: Supple, no large masses.   Pulmonary:  Good air movement, no audible wheezing bilaterally, no use of accessory muscles.  Cardiac: RRR, no JVD Vascular: Large varicosities present extensively greater than 10 mm bilaterally.  Mild venous stasis changes to the legs bilaterally.  2+ soft pitting edema Vessel Right Left  Radial Palpable Palpable  PT Palpable Palpable  DP Palpable Palpable  Gastrointestinal: Non-distended. No guarding/no peritoneal signs.  Musculoskeletal: M/S 5/5 throughout.  No deformity or atrophy.  Neurologic: CN 2-12 intact. Symmetrical.  Speech is fluent. Motor exam as listed above. Psychiatric: Judgment intact, Mood & affect appropriate for pt's clinical situation. Dermatologic: venous rashes no ulcers noted.  No changes consistent with cellulitis. Lymph : No  lichenification or skin changes of chronic lymphedema.  CBC Lab Results  Component Value Date   WBC 6.4 03/02/2017   HGB 12.2 (L) 03/02/2017   HCT 37.8 (L) 03/02/2017   MCV 80.4 03/02/2017   PLT 181.0 03/02/2017    BMET    Component Value Date/Time   NA 141 03/02/2017 1410   NA 141 05/26/2014 1101   K 4.0 03/02/2017 1410   K 3.6 05/26/2014 1101   CL 104 03/02/2017 1410   CL 106 05/26/2014 1101   CO2 31 03/02/2017 1410   CO2 27 05/26/2014 1101   GLUCOSE 100 (H) 03/02/2017 1410   GLUCOSE 100 (H) 05/26/2014 1101   BUN 17 03/02/2017 1410   BUN 16 05/26/2014 1101   CREATININE 0.88 03/02/2017 1410   CREATININE 0.87 05/26/2014 1101   CALCIUM 9.0 03/02/2017 1410   CALCIUM 8.8 05/26/2014 1101   GFRNONAA >60 07/16/2016 1212   GFRNONAA >60 05/26/2014 1101  GFRNONAA >60 05/21/2013 0403   GFRAA >60 07/16/2016 1212   GFRAA >60 05/26/2014 1101   GFRAA >60 05/21/2013 0403   CrCl cannot be calculated (Patient's most recent lab result is older than the maximum 21 days allowed.).  COAG Lab Results  Component Value Date   INR 1.1 05/18/2013    Radiology No results found.  Assessment/Plan 1. Varicose veins of both lower extremities with pain  Recommend:  The patient has large symptomatic varicose veins that are painful and associated with swelling.  I have had a long discussion with the patient regarding  varicose veins and why they cause symptoms.  Patient will begin wearing graduated compression stockings class 1 on a daily basis, beginning first thing in the morning and removing them in the evening. The patient is instructed specifically not to sleep in the stockings.    The patient  will also begin using over-the-counter analgesics such as Motrin 600 mg po TID to help control the symptoms.    In addition, behavioral modification including elevation during the [place] will be initiated.    Pending the results of these changes the  patient will be reevaluated in three months.    An  ultrasound of the venous system will be obtained.   Further plans will be based on the ultrasound results and whether conservative therapies are successful at eliminating the pain and swelling.  - VAS US LOWER EXTREMITY VENOUS REFLUX; Future  2. Chronic venous insufficiency No surgery or intervention at this point in time.    I have had a long discussion with the patient regarding venous insufficiency and why it  causes symptoms. I have discussed with the patient the chronic skin changes that accompany venous insufficiency and the long term sequela such as infection and ulceration.  Patient will begin wearing graduated compression stockings class 1 (20-30 mmHg) or compression wraps on a daily basis a prescription was given. The patient will put the stockings on first thing in the morning and removing them in the evening. The patient is instructed specifically not to sleep in the stockings.    In addition, behavioral modification including several periods of elevation of the lower extremities during the [place] will be continued. I have demonstrated that proper elevation is a position with the ankles at heart level.  The patient is instructed to begin routine exercise, especially walking on a daily basis  Patient should undergo duplex ultrasound of the venous system to ensure that DVT or reflux is not present.  Following the review of the ultrasound the patient will follow up in 2-3 months to reassess the degree of swelling and the control that graduated compression stockings or compression wraps  is offering.   The patient can be assessed for a Lymph Pump at that time  3. Essential hypertension Continue antihypertensive medications as already ordered, these medications have been reviewed and there are no changes at this time.   4. Coronary artery disease, non-occlusive Continue cardiac and antihypertensive medications as already ordered and reviewed, no changes at this time.  Continue statin  as ordered and reviewed, no changes at this time  Nitrates PRN for chest pain     Levora DredgeGregory Marissia Blackham, MD  04/10/2017 4:48 PM

## 2017-05-02 ENCOUNTER — Other Ambulatory Visit: Payer: Self-pay | Admitting: *Deleted

## 2017-05-02 ENCOUNTER — Telehealth: Payer: Self-pay | Admitting: Family Medicine

## 2017-05-02 MED ORDER — METOPROLOL SUCCINATE ER 25 MG PO TB24: 25.0000 mg | ORAL_TABLET | Freq: Every day | ORAL | 0 refills | Status: DC

## 2017-05-02 MED ORDER — ATORVASTATIN CALCIUM 80 MG PO TABS: 80.0000 mg | ORAL_TABLET | Freq: Every day | ORAL | 0 refills | Status: DC

## 2017-05-02 MED ORDER — FERROUS SULFATE 325 (65 FE) MG PO TBEC
325.0000 mg | DELAYED_RELEASE_TABLET | Freq: Two times a day (BID) | ORAL | 3 refills | Status: DC
Start: 2017-05-02 — End: 2017-06-06

## 2017-05-02 MED ORDER — LISINOPRIL 10 MG PO TABS: 10.0000 mg | ORAL_TABLET | Freq: Every day | ORAL | 0 refills | Status: DC

## 2017-05-02 MED ORDER — FUROSEMIDE 20 MG PO TABS: 20.0000 mg | ORAL_TABLET | Freq: Every day | ORAL | 0 refills | Status: DC

## 2017-05-02 MED ORDER — POTASSIUM CHLORIDE CRYS ER 10 MEQ PO TBCR: 10.0000 meq | EXTENDED_RELEASE_TABLET | Freq: Every day | ORAL | 0 refills | Status: DC

## 2017-05-02 MED ORDER — CLOPIDOGREL BISULFATE 75 MG PO TABS: 75.0000 mg | ORAL_TABLET | Freq: Every day | ORAL | 0 refills | Status: DC

## 2017-05-02 NOTE — Telephone Encounter (Signed)
Copied from CRM [id number]. Topic: Quick Communication - See Telephone Encounter >> May 02, 2017  9:42 AM Oneal GroutSebastian, Jennifer S wrote: CRM for notification. See Telephone encounter for:  Needing Refills sent to local pharmacy, will not receive mail order before he will run out.  ferrous sulfate 325 (65 FE) MG EC tablet (please send to [facility] as will on this one only) atorvastatin (LIPITOR) 80 MG tablet  clopidogrel (PLAVIX) 75 MG tablet  furosemide (LASIX) 20 MG tablet  lisinopril (PRINIVIL,ZESTRIL) 10 MG tablet  metoprolol succinate (TOPROL XL) 25 MG 24 hr tablet potassium chloride (KLOR-CON M10) 10 MEQ tablet  Please send to [facility], [place]. Ph # [phone]  05/02/17.

## 2017-05-02 NOTE — Telephone Encounter (Signed)
Rx for 30 [place] supply sent to local pharmacy on medications requested. Iron Rx sent to mail order.

## 2017-05-23 ENCOUNTER — Encounter: Payer: Self-pay | Admitting: Family Medicine

## 2017-05-23 ENCOUNTER — Other Ambulatory Visit: Payer: Self-pay

## 2017-05-23 ENCOUNTER — Ambulatory Visit (INDEPENDENT_AMBULATORY_CARE_PROVIDER_SITE_OTHER): Payer: Medicare Other | Admitting: Family Medicine

## 2017-05-23 VITALS — BP 136/60 | HR 91 | Temp 97.6°F | Wt 279.0 lb

## 2017-05-23 DIAGNOSIS — D509 Iron deficiency anemia, unspecified: Secondary | ICD-10-CM

## 2017-05-23 DIAGNOSIS — E1159 Type 2 diabetes mellitus with other circulatory complications: Secondary | ICD-10-CM

## 2017-05-23 DIAGNOSIS — I872 Venous insufficiency (chronic) (peripheral): Secondary | ICD-10-CM | POA: Diagnosis not present

## 2017-05-23 DIAGNOSIS — M1991 Primary osteoarthritis, unspecified site: Secondary | ICD-10-CM | POA: Diagnosis not present

## 2017-05-23 DIAGNOSIS — I10 Essential (primary) hypertension: Secondary | ICD-10-CM | POA: Diagnosis not present

## 2017-05-23 LAB — CBC
HEMATOCRIT: 36.5 % — AB (ref 39.0–52.0)
HEMOGLOBIN: 11.9 g/dL — AB (ref 13.0–17.0)
MCHC: 32.5 g/dL (ref 30.0–36.0)
MCV: 80.3 fl (ref 78.0–100.0)
Platelets: 146 10*3/uL — ABNORMAL LOW (ref 150.0–400.0)
RBC: 4.55 Mil/uL (ref 4.22–5.81)
RDW: 15.8 % — ABNORMAL HIGH (ref 11.5–15.5)
WBC: 5.2 10*3/uL (ref 4.0–10.5)

## 2017-05-23 LAB — COMPREHENSIVE METABOLIC PANEL
ALBUMIN: 3.4 g/dL — AB (ref 3.5–5.2)
ALK PHOS: 77 U/L (ref 39–117)
ALT: 12 U/L (ref 0–53)
AST: 17 U/L (ref 0–37)
BUN: 14 mg/dL (ref 6–23)
CHLORIDE: 105 meq/L (ref 96–112)
CO2: 32 mEq/L (ref 19–32)
Calcium: 8.7 mg/dL (ref 8.4–10.5)
Creatinine, Ser: 0.75 mg/dL (ref 0.40–1.50)
GFR: 127.46 mL/min (ref >60.00)
Glucose, Bld: 136 mg/dL — ABNORMAL HIGH (ref 70–99)
POTASSIUM: 3.7 meq/L (ref 3.5–5.1)
SODIUM: 143 meq/L (ref 135–145)
TOTAL PROTEIN: 6.4 g/dL (ref 6.0–8.3)
Total Bilirubin: 1.1 mg/dL (ref 0.2–1.2)

## 2017-05-23 LAB — HEMOGLOBIN A1C: HEMOGLOBIN A1C: 5.7 % (ref 4.6–6.5)

## 2017-05-23 NOTE — Patient Instructions (Signed)
Nice to see you. Please try to wear the compression stockings as advised by vascular surgery. We will check lab work today and contact you with results. If you decide you want to do physical therapy please let us know.

## 2017-05-23 NOTE — Progress Notes (Signed)
Tommi Rumps, MD Phone: [phone]  [name] is a 84 y.o. male who presents today for follow-up.  Hypertension: Has been relatively well controlled.  Taking metoprolol, lisinopril, and Lasix.  No chest pain or shortness of breath.  He additionally takes Plavix and Lipitor.  Does have a history of CAD.  Has a history of anemia.  Found to be iron deficient.  Stool cards were negative.  He notes no bleeding.  He has been off of iron as he had a rash on his legs related to the supplement.  Due for recheck.  Patient saw vascular surgery regarding his leg swelling.  He was advised to wear compression stockings though has only been intermittently wearing these due to difficulty having them on underneath his pants.  He has thought about wearing them at night though I discussed they advised him not to wear them while he slept.  Discussed elevation of his legs as well.  Patient does report somewhat of a difficult time getting around related to chronic joint pain in his legs.  He uses a 4 pronged cane to help walk.  He has not done physical therapy previously.  Diabetes: He does note urinating 4-5 times a [place] and one time at night.  No polydipsia.  Not currently on any medications.  Social History   Tobacco Use  Smoking Status Former Smoker  . Packs/Skalla: 1.50  . Years: 20.00  . Pack years: 30.00  . Types: Cigarettes  . Last attempt to quit: 02/22/1981  . Years since quitting: 36.2  Smokeless Tobacco Never Used     ROS see history of present illness  Objective  Physical Exam Vitals:   05/23/17 1033  BP: 136/60  Pulse: 91  Temp: 97.6 F (36.4 C)  SpO2: 96%    BP Readings from Last 3 Encounters:  05/23/17 136/60  04/05/17 128/62  04/04/17 (!) 127/55   Wt Readings from Last 3 Encounters:  05/23/17 279 lb (126.6 kg)  04/05/17 277 lb (125.6 kg)  04/04/17 278 lb (126.1 kg)    Physical Exam  Constitutional: No distress.  Cardiovascular: Normal rate, regular rhythm and  normal heart sounds.  Pulmonary/Chest: Effort normal and breath sounds normal.  Musculoskeletal:  Chronic venous stasis changes bilateral lower extremities, edema left greater than right that is chronic  Neurological: He is alert. Gait normal.  Skin: Skin is warm and dry. He is not diaphoretic.     Assessment/Plan: Please see individual problem list.  Chronic venous insufficiency This is the likely cause of his lower extremity swelling.  I discussed wearing his compression stockings during the [place].  He will not wear them at night.  He will keep his legs propped up.  He will keep his follow-up with vascular surgery.  Diabetes mellitus ([place]) Needs an A1c checked.  Has been controlled.  Iron deficiency anemia Needs recheck.  May need to go back on iron supplementation.  Osteoarthritis Suspect osteoarthritis in his legs.  Offered physical therapy referral though he deferred.  He wants to continue to monitor.   Ghali was seen today for follow-up.  Diagnoses and all orders for this visit:  Type 2 diabetes mellitus with other circulatory complication, unspecified whether long term insulin use (HCC) -     HgB A1c  Essential hypertension -     Comp Met (CMET)  Iron deficiency anemia, unspecified iron deficiency anemia type -     CBC -     Iron, TIBC and Ferritin Panel  Chronic venous insufficiency  Primary osteoarthritis, unspecified site    Orders Placed This Encounter  Procedures  . Comp Met (CMET)  . CBC  . Iron, TIBC and Ferritin Panel  . HgB A1c    No orders of the defined types were placed in this encounter.    Tommi Rumps, MD [place]

## 2017-05-24 LAB — IRON,TIBC AND FERRITIN PANEL
%SAT: 14 % — AB (ref 15–60)
FERRITIN: 18 ng/mL — AB (ref 20–380)
Iron: 41 ug/dL — ABNORMAL LOW (ref 50–180)
TIBC: 297 mcg/dL (calc) (ref 250–425)

## 2017-05-28 ENCOUNTER — Other Ambulatory Visit: Payer: Self-pay | Admitting: Family Medicine

## 2017-05-28 DIAGNOSIS — D509 Iron deficiency anemia, unspecified: Secondary | ICD-10-CM

## 2017-05-28 DIAGNOSIS — M1990 Unspecified osteoarthritis, unspecified site: Secondary | ICD-10-CM | POA: Insufficient documentation

## 2017-05-28 NOTE — Assessment & Plan Note (Signed)
Suspect osteoarthritis in his legs.  Offered physical therapy referral though he deferred.  He wants to continue to monitor.

## 2017-05-28 NOTE — Assessment & Plan Note (Addendum)
This is the likely cause of his lower extremity swelling.  I discussed wearing his compression stockings during the [place].  He will not wear them at night.  He will keep his legs propped up.  He will keep his follow-up with vascular surgery.

## 2017-05-28 NOTE — Assessment & Plan Note (Signed)
Needs an A1c checked.  Has been controlled.

## 2017-05-28 NOTE — Assessment & Plan Note (Signed)
Needs recheck.  May need to go back on iron supplementation.

## 2017-06-01 ENCOUNTER — Other Ambulatory Visit: Payer: Self-pay | Admitting: Family Medicine

## 2017-06-06 ENCOUNTER — Telehealth: Payer: Self-pay | Admitting: Family Medicine

## 2017-06-06 DIAGNOSIS — D649 Anemia, unspecified: Secondary | ICD-10-CM

## 2017-06-06 MED ORDER — FERROUS SULFATE 325 (65 FE) MG PO TBEC: 325.0000 mg | DELAYED_RELEASE_TABLET | Freq: Two times a day (BID) | ORAL | 2 refills | Status: DC

## 2017-06-06 NOTE — Telephone Encounter (Signed)
[facility] by an rn from the [facility]. Please advise She is going to cancel the appointment because she wanted to see if the ferrous sulfate would help first and he has not received this. She would like this called [facility] and [facility] for long term

## 2017-06-06 NOTE — Telephone Encounter (Signed)
Copied from CRM [id number]. Topic: Quick Communication - See Telephone Encounter >> Jun 06, 2017 10:41 AM Landry MellowFoltz, Melissa J wrote: CRM for notification. See Telephone encounter for:   06/06/17. Daughter called - pt does not have any ferrous sulfate medication and states that it was never called into the pharmacy.  Daughter  will be canceling the appt for the specialist.  She would like call back with explanation of the medication not being sent into pharmacy.  She states that pt has not been taking this med for a while. Cb # is [phone]

## 2017-06-06 NOTE — Telephone Encounter (Signed)
Sent to local pharmacy.  Patient needs lab work set up for about a month.  We can send in a long-term supply once we know if he needs to stay on this chronically.

## 2017-06-06 NOTE — Telephone Encounter (Signed)
Please advise 

## 2017-06-07 NOTE — Telephone Encounter (Signed)
Patient is scheduled for labs 06/27/17 please place orders

## 2017-06-07 NOTE — Telephone Encounter (Signed)
Already ordered

## 2017-06-09 ENCOUNTER — Ambulatory Visit: Payer: Medicare Other | Admitting: Gastroenterology

## 2017-06-27 ENCOUNTER — Other Ambulatory Visit: Payer: Medicare Other

## 2017-07-07 ENCOUNTER — Ambulatory Visit (INDEPENDENT_AMBULATORY_CARE_PROVIDER_SITE_OTHER): Payer: Medicare Other

## 2017-07-07 ENCOUNTER — Ambulatory Visit (INDEPENDENT_AMBULATORY_CARE_PROVIDER_SITE_OTHER): Payer: Medicare Other | Admitting: Vascular Surgery

## 2017-07-07 ENCOUNTER — Encounter (INDEPENDENT_AMBULATORY_CARE_PROVIDER_SITE_OTHER): Payer: Self-pay | Admitting: Vascular Surgery

## 2017-07-07 VITALS — BP 127/50 | HR 74 | Resp 19 | Ht 71.0 in | Wt 277.0 lb

## 2017-07-07 DIAGNOSIS — M1991 Primary osteoarthritis, unspecified site: Secondary | ICD-10-CM | POA: Diagnosis not present

## 2017-07-07 DIAGNOSIS — E1159 Type 2 diabetes mellitus with other circulatory complications: Secondary | ICD-10-CM | POA: Diagnosis not present

## 2017-07-07 DIAGNOSIS — I872 Venous insufficiency (chronic) (peripheral): Secondary | ICD-10-CM | POA: Diagnosis not present

## 2017-07-07 DIAGNOSIS — I83812 Varicose veins of left lower extremities with pain: Secondary | ICD-10-CM | POA: Diagnosis not present

## 2017-07-07 DIAGNOSIS — I83811 Varicose veins of right lower extremities with pain: Secondary | ICD-10-CM | POA: Diagnosis not present

## 2017-07-07 DIAGNOSIS — I83009 Varicose veins of unspecified lower extremity with ulcer of unspecified site: Secondary | ICD-10-CM | POA: Diagnosis not present

## 2017-07-07 DIAGNOSIS — L97909 Non-pressure chronic ulcer of unspecified part of unspecified lower leg with unspecified severity: Secondary | ICD-10-CM

## 2017-07-07 DIAGNOSIS — E782 Mixed hyperlipidemia: Secondary | ICD-10-CM | POA: Diagnosis not present

## 2017-07-07 DIAGNOSIS — I890 Lymphedema, not elsewhere classified: Secondary | ICD-10-CM | POA: Diagnosis not present

## 2017-07-07 DIAGNOSIS — I83813 Varicose veins of bilateral lower extremities with pain: Secondary | ICD-10-CM

## 2017-07-07 MED ORDER — DOXYCYCLINE HYCLATE 100 MG PO CAPS: 100.0000 mg | ORAL_CAPSULE | Freq: Two times a day (BID) | ORAL | 0 refills | Status: DC

## 2017-07-07 NOTE — Progress Notes (Signed)
MRN : [id number]  [name] is a 84 y.o. (1930/11/14) male who presents with chief complaint of No chief complaint on file. Marland Kitchen  History of Present Illness: The patient returns to the office for followup evaluation regarding leg swelling.  The swelling has persisted and the pain associated with swelling continues. There have not been any interval development of a ulcerations or wounds.  Since the previous visit the patient has been wearing graduated compression stockings and has noted little if any improvement in the lymphedema. The patient has been using compression routinely morning until night.  The patient also states elevation during the [place] and exercise is being done too.   No outpatient medications have been marked as taking for the 07/07/17 encounter (Appointment) with Gilda Crease, Latina Craver, MD.    Past Medical History:  Diagnosis Date  . Aortic atherosclerosis (HCC) 05/2013   Per TEE  . Arrhythmia   . Asthma   . Colon polyp   . Coronary artery disease    a. Nonobstructive by 2004 cath b. Nonobstructive by 2015 cath in setting of NSTEMI  . Diabetes mellitus without complication (HCC)    a. Hgb A1C 5.9% 05/2013  . GERD (gastroesophageal reflux disease)   . Heart murmur   . Hiatal hernia   . Hyperlipidemia   . Hypertension   . Moderate aortic stenosis    a. echo 2013: EF 55%, nl wall motion, mild DD, mild LVH, trace MR, moderate AS with peak gradient of 55 mm Hg; b. TEE 05/2013: EF EF 60-65%, borderline LVH, mild MR/AI, mod AS with valve area 1.0-1.26; c. echo 10/2014: 55-60%, no RWMA, GR1DD, mod AS w/ mean gradient of 28 mm Hg     . Obesity   . Onychomycosis   . Osteoarthritis     Past Surgical History:  Procedure Laterality Date  . CARDIAC CATHETERIZATION  11/2002  . CARDIAC CATHETERIZATION  05/21/2013   showing 30% oLM, 30% D1, 20% mLCx, 30% pRCA; EF 60%, severe AS (mean gradient 28 mmHg, peak 26, area 0.9)  . CHOLECYSTECTOMY  2001  . TRANSESOPHAGEAL ECHOCARDIOGRAM   05/22/2013   Preserved EF, moderate AS (valve area by planimetry between 1.0-1.26 cm sq), moderate aortic arch and descending aortic atherosclerosis.   . TRANSTHORACIC ECHOCARDIOGRAM  05/19/2013   EF 60-65%, impaired diastolic function, mild LA dilatation, mild MR/AI/TR, mod AS, high normal RVSP (30.4 mmHg)    Social History Social History   Tobacco Use  . Smoking status: Former Smoker    Packs/Nucci: 1.50    Years: 20.00    Pack years: 30.00    Types: Cigarettes    Last attempt to quit: 02/22/1981    Years since quitting: 36.3  . Smokeless tobacco: Never Used  Substance Use Topics  . Alcohol use: No  . Drug use: No    Family History Family History  Problem Relation Age of Onset  . Heart disease Mother   . Heart disease Father 29  . Stroke Brother     Allergies  Allergen Reactions  . Aspirin Rash and Other (See Comments)    Reaction: Trouble breathing      REVIEW OF SYSTEMS (Negative unless checked)  Constitutional: [] Weight loss  [] Fever  [] Chills Cardiac: [] Chest pain   [] Chest pressure   [] Palpitations   [] Shortness of breath when laying flat   [] Shortness of breath with exertion. Vascular:  [] Pain in legs with walking   [] Pain in legs at rest  [] History of DVT   [] Phlebitis   []   Swelling in legs   [x] Varicose veins   [] Non-healing ulcers Pulmonary:   [] Uses home oxygen   [] Productive cough   [] Hemoptysis   [] Wheeze  [] COPD   [] Asthma Neurologic:  [] Dizziness   [] Seizures   [] History of stroke   [] History of TIA  [] Aphasia   [] Vissual changes   [] Weakness or numbness in arm   [] Weakness or numbness in leg Musculoskeletal:   [] Joint swelling   [] Joint pain   [] Low back pain Hematologic:  [] Easy bruising  [] Easy bleeding   [] Hypercoagulable state   [] Anemic Gastrointestinal:  [] Diarrhea   [] Vomiting  [] Gastroesophageal reflux/heartburn   [] Difficulty swallowing. Genitourinary:  [] Chronic kidney disease   [] Difficult urination  [] Frequent urination   [] Blood in urine Skin:   [x] Rashes   [x] Ulcers  Psychological:  [] History of anxiety   []  History of major depression.  Physical Examination  There were no vitals filed for this visit. There is no height or weight on file to calculate BMI. Gen: WD/WN, NAD Head: [place]/AT, No temporalis wasting.  Ear/Nose/Throat: Hearing grossly intact, nares w/o erythema or drainage Eyes: PER, EOMI, sclera nonicteric.  Neck: Supple, no large masses.   Pulmonary:  Good air movement, no audible wheezing bilaterally, no use of accessory muscles.  Cardiac: RRR, no JVD Vascular: 2-3+ edema of the right leg with severe venous changes of the right and left leg.  Venous ulcer noted in the ankle area on the right, mild cellulits Vessel Right Left  Radial Palpable Palpable  PT Palpable Palpable  DP Palpable Palpable  Gastrointestinal: Non-distended. No guarding/no peritoneal signs.  Musculoskeletal: M/S 5/5 throughout.  No deformity or atrophy.  Neurologic: CN 2-12 intact. Symmetrical.  Speech is fluent. Motor exam as listed above. Psychiatric: Judgment intact, Mood & affect appropriate for pt's clinical situation. Dermatologic: Venous stasis dermatitis with ulcers present on the right.  No changes consistent with cellulitis. Lymph : No lichenification or skin changes of chronic lymphedema.  CBC Lab Results  Component Value Date   WBC 5.2 05/23/2017   HGB 11.9 (L) 05/23/2017   HCT 36.5 (L) 05/23/2017   MCV 80.3 05/23/2017   PLT 146.0 (L) 05/23/2017    BMET    Component Value Date/Time   NA 143 05/23/2017 1103   NA 141 05/26/2014 1101   K 3.7 05/23/2017 1103   K 3.6 05/26/2014 1101   CL 105 05/23/2017 1103   CL 106 05/26/2014 1101   CO2 32 05/23/2017 1103   CO2 27 05/26/2014 1101   GLUCOSE 136 (H) 05/23/2017 1103   GLUCOSE 100 (H) 05/26/2014 1101   BUN 14 05/23/2017 1103   BUN 16 05/26/2014 1101   CREATININE 0.75 05/23/2017 1103   CREATININE 0.87 05/26/2014 1101   CALCIUM 8.7 05/23/2017 1103   CALCIUM 8.8 05/26/2014  1101   GFRNONAA >60 07/16/2016 1212   GFRNONAA >60 05/26/2014 1101   GFRNONAA >60 05/21/2013 0403   GFRAA >60 07/16/2016 1212   GFRAA >60 05/26/2014 1101   GFRAA >60 05/21/2013 0403   CrCl cannot be calculated (Patient's most recent lab result is older than the maximum 21 days allowed.).  COAG Lab Results  Component Value Date   INR 1.1 05/18/2013    Radiology No results found.   Assessment/Plan 1. Lymphedema Recommend:  No surgery or intervention at this point in time.    I have reviewed my previous discussion with the patient regarding swelling and why it causes symptoms.  Patient will continue wearing graduated compression stockings class 1 (20-30 mmHg)  on a daily basis. The patient will  beginning wearing the stockings first thing in the morning and removing them in the evening. The patient is instructed specifically not to sleep in the stockings.    In addition, behavioral modification including several periods of elevation of the lower extremities during the [place] will be continued.  This was reviewed with the patient during the initial visit.  The patient will also continue routine exercise, especially walking on a daily basis as was discussed during the initial visit.    Despite conservative treatments including graduated compression therapy class 1 and behavioral modification including exercise and elevation the patient  has not obtained adequate control of the lymphedema.  The patient still has stage 3 lymphedema and therefore, I believe that a lymph pump should be added to improve the control of the patient's lymphedema.  Additionally, a lymph pump is warranted because it will reduce the risk of cellulitis and ulceration in the future.  Patient should follow-up in six months    2. Venous ulcer (HCC) Slight redness surrounding the ulcer I will treat with a course of doxycycline for 10 days  3. Chronic venous insufficiency See #1  4. Type 2 diabetes mellitus with  other circulatory complication, unspecified whether long term insulin use (HCC) Continue hypoglycemic medications as already ordered, these medications have been reviewed and there are no changes at this time.  Hgb A1C to be monitored as already arranged by primary service   5. Primary osteoarthritis, unspecified site Continue NSAID medications as already ordered, these medications have been reviewed and there are no changes at this time.  Continued activity and therapy was stressed.   6. Mixed hyperlipidemia Continue pulmonary medications and aerosols as already ordered, these medications have been reviewed and there are no changes at this time.      Levora DredgeGregory Alfreda Hammad, MD  07/07/2017 1:12 PM

## 2017-07-08 ENCOUNTER — Encounter (INDEPENDENT_AMBULATORY_CARE_PROVIDER_SITE_OTHER): Payer: Self-pay | Admitting: Vascular Surgery

## 2017-07-08 DIAGNOSIS — L97909 Non-pressure chronic ulcer of unspecified part of unspecified lower leg with unspecified severity: Principal | ICD-10-CM

## 2017-07-08 DIAGNOSIS — I83009 Varicose veins of unspecified lower extremity with ulcer of unspecified site: Secondary | ICD-10-CM | POA: Insufficient documentation

## 2017-07-27 ENCOUNTER — Other Ambulatory Visit (INDEPENDENT_AMBULATORY_CARE_PROVIDER_SITE_OTHER): Payer: Medicare Other

## 2017-07-27 DIAGNOSIS — D649 Anemia, unspecified: Secondary | ICD-10-CM | POA: Diagnosis not present

## 2017-07-27 NOTE — Addendum Note (Signed)
Addended by: Penne LashWIGGINS, Takeem Krotzer N on: 07/27/2017 03:06 PM   Modules accepted: Orders

## 2017-07-28 LAB — IRON,TIBC AND FERRITIN PANEL
%SAT: 16 % (ref 15–60)
Ferritin: 29 ng/mL (ref 20–380)
Iron: 42 ug/dL — ABNORMAL LOW (ref 50–180)
TIBC: 265 mcg/dL (calc) (ref 250–425)

## 2017-07-28 LAB — CBC
HEMATOCRIT: 35.5 % — AB (ref 38.5–50.0)
Hemoglobin: 11.8 g/dL — ABNORMAL LOW (ref 13.2–17.1)
MCH: 26.2 pg — ABNORMAL LOW (ref 27.0–33.0)
MCHC: 33.2 g/dL (ref 32.0–36.0)
MCV: 78.7 fL — AB (ref 80.0–100.0)
MPV: 11.9 fL (ref 7.5–12.5)
Platelets: 155 10*3/uL (ref 140–400)
RBC: 4.51 10*6/uL (ref 4.20–5.80)
RDW: 14.4 % (ref 11.0–15.0)
WBC: 5.5 10*3/uL (ref 3.8–10.8)

## 2017-07-30 ENCOUNTER — Other Ambulatory Visit: Payer: Self-pay | Admitting: Family Medicine

## 2017-07-30 DIAGNOSIS — D509 Iron deficiency anemia, unspecified: Secondary | ICD-10-CM

## 2017-08-03 ENCOUNTER — Telehealth: Payer: Self-pay | Admitting: Family Medicine

## 2017-08-03 NOTE — Telephone Encounter (Signed)
Copied from CRM [id number]. Topic: Quick Communication - See Telephone Encounter >> Aug 03, 2017  3:00 PM Terisa Starraylor, Brittany L wrote: CRM for notification. See Telephone encounter for:   08/03/17.  Pt's daughter Tonia GhentLawanna is requesting his lab results from last Thursday. Please call back @ [phone]

## 2017-08-03 NOTE — Telephone Encounter (Signed)
Patients daughter notified of recent labs

## 2017-08-03 NOTE — Telephone Encounter (Signed)
It looks like you called and gave patient and his daughter results on 07-30-17. Please advise.

## 2017-08-05 ENCOUNTER — Emergency Department: Payer: Medicare Other

## 2017-08-05 ENCOUNTER — Other Ambulatory Visit: Payer: Self-pay

## 2017-08-05 ENCOUNTER — Encounter: Payer: Self-pay | Admitting: Emergency Medicine

## 2017-08-05 ENCOUNTER — Emergency Department
Admission: EM | Admit: 2017-08-05 | Discharge: 2017-08-05 | Disposition: A | Discharge status: Home / Self Care | Payer: Medicare Other | Source: Home / Self Care | Attending: Emergency Medicine | Admitting: Emergency Medicine

## 2017-08-05 DIAGNOSIS — Z9049 Acquired absence of other specified parts of digestive tract: Secondary | ICD-10-CM | POA: Insufficient documentation

## 2017-08-05 DIAGNOSIS — J329 Chronic sinusitis, unspecified: Secondary | ICD-10-CM | POA: Diagnosis not present

## 2017-08-05 DIAGNOSIS — J45909 Unspecified asthma, uncomplicated: Secondary | ICD-10-CM | POA: Insufficient documentation

## 2017-08-05 DIAGNOSIS — I2510 Atherosclerotic heart disease of native coronary artery without angina pectoris: Secondary | ICD-10-CM

## 2017-08-05 DIAGNOSIS — L03115 Cellulitis of right lower limb: Secondary | ICD-10-CM

## 2017-08-05 DIAGNOSIS — I10 Essential (primary) hypertension: Secondary | ICD-10-CM | POA: Insufficient documentation

## 2017-08-05 DIAGNOSIS — E119 Type 2 diabetes mellitus without complications: Secondary | ICD-10-CM

## 2017-08-05 DIAGNOSIS — Z7902 Long term (current) use of antithrombotics/antiplatelets: Secondary | ICD-10-CM | POA: Insufficient documentation

## 2017-08-05 DIAGNOSIS — Z87891 Personal history of nicotine dependence: Secondary | ICD-10-CM | POA: Insufficient documentation

## 2017-08-05 DIAGNOSIS — L03116 Cellulitis of left lower limb: Secondary | ICD-10-CM

## 2017-08-05 DIAGNOSIS — Z609 Problem related to social environment, unspecified: Secondary | ICD-10-CM | POA: Diagnosis not present

## 2017-08-05 DIAGNOSIS — L0390 Cellulitis, unspecified: Secondary | ICD-10-CM

## 2017-08-05 DIAGNOSIS — Z79899 Other long term (current) drug therapy: Secondary | ICD-10-CM | POA: Insufficient documentation

## 2017-08-05 DIAGNOSIS — M7989 Other specified soft tissue disorders: Secondary | ICD-10-CM | POA: Diagnosis not present

## 2017-08-05 DIAGNOSIS — J019 Acute sinusitis, unspecified: Secondary | ICD-10-CM | POA: Insufficient documentation

## 2017-08-05 DIAGNOSIS — I5033 Acute on chronic diastolic (congestive) heart failure: Secondary | ICD-10-CM | POA: Diagnosis not present

## 2017-08-05 DIAGNOSIS — R51 Headache: Secondary | ICD-10-CM | POA: Diagnosis not present

## 2017-08-05 DIAGNOSIS — K219 Gastro-esophageal reflux disease without esophagitis: Secondary | ICD-10-CM | POA: Diagnosis not present

## 2017-08-05 LAB — CBC WITH DIFFERENTIAL/PLATELET
BASOS PCT: 1 %
Basophils Absolute: 0 10*3/uL (ref 0–0.1)
EOS PCT: 2 %
Eosinophils Absolute: 0.1 10*3/uL (ref 0–0.7)
HCT: 39.2 % — ABNORMAL LOW (ref 40.0–52.0)
Hemoglobin: 13 g/dL (ref 13.0–18.0)
LYMPHS PCT: 19 %
Lymphs Abs: 1.1 10*3/uL (ref 1.0–3.6)
MCH: 26.4 pg (ref 26.0–34.0)
MCHC: 33.1 g/dL (ref 32.0–36.0)
MCV: 80 fL (ref 80.0–100.0)
MONO ABS: 0.4 10*3/uL (ref 0.2–1.0)
Monocytes Relative: 6 %
NEUTROS ABS: 4.4 10*3/uL (ref 1.4–6.5)
Neutrophils Relative %: 72 %
PLATELETS: 160 10*3/uL (ref 150–440)
RBC: 4.9 MIL/uL (ref 4.40–5.90)
RDW: 16.2 % — AB (ref 11.5–14.5)
WBC: 6.1 10*3/uL (ref 3.8–10.6)

## 2017-08-05 LAB — COMPREHENSIVE METABOLIC PANEL
ALT: 13 U/L — AB (ref 17–63)
AST: 24 U/L (ref 15–41)
Albumin: 3.5 g/dL (ref 3.5–5.0)
Alkaline Phosphatase: 77 U/L (ref 38–126)
Anion gap: 7 (ref 5–15)
BUN: 13 mg/dL (ref 6–20)
CHLORIDE: 105 mmol/L (ref 101–111)
CO2: 27 mmol/L (ref 22–32)
CREATININE: 0.82 mg/dL (ref 0.61–1.24)
Calcium: 8.8 mg/dL — ABNORMAL LOW (ref 8.9–10.3)
GFR calc Af Amer: >60 mL/min (ref >60)
GLUCOSE: 109 mg/dL — AB (ref 65–99)
POTASSIUM: 3.9 mmol/L (ref 3.5–5.1)
Sodium: 139 mmol/L (ref 135–145)
Total Bilirubin: 2.1 mg/dL — ABNORMAL HIGH (ref 0.3–1.2)
Total Protein: 7.1 g/dL (ref 6.5–8.1)

## 2017-08-05 MED ORDER — AMOXICILLIN-POT CLAVULANATE 875-125 MG PO TABS: 1.0000 | ORAL_TABLET | Freq: Two times a day (BID) | ORAL | 0 refills | Status: DC

## 2017-08-05 NOTE — ED Provider Notes (Signed)
[facility] Emergency Department Provider Note   ____________________________________________   First MD Initiated Contact with Patient 08/05/17 1802     (approximate)  I have reviewed the triage vital signs and the nursing notes.   HISTORY  Chief Complaint Headache    HPI [name] is a 84 y.o. male She reports a headache that started last night or yesterday. Seems to be diffuse. He reports now is mild. Didn't try anything to help with pain in his head get better. He also complains of swelling in his legs especially the right one is been present for at least 3-4 weeks. Right leg is warm.   Past Medical History:  Diagnosis Date  . Aortic atherosclerosis (HCC) 05/2013   Per TEE  . Arrhythmia   . Asthma   . Colon polyp   . Coronary artery disease    a. Nonobstructive by 2004 cath b. Nonobstructive by 2015 cath in setting of NSTEMI  . Diabetes mellitus without complication (HCC)    a. Hgb A1C 5.9% 05/2013  . GERD (gastroesophageal reflux disease)   . Heart murmur   . Hiatal hernia   . Hyperlipidemia   . Hypertension   . Moderate aortic stenosis    a. echo 2013: EF 55%, nl wall motion, mild DD, mild LVH, trace MR, moderate AS with peak gradient of 55 mm Hg; b. TEE 05/2013: EF EF 60-65%, borderline LVH, mild MR/AI, mod AS with valve area 1.0-1.26; c. echo 10/2014: 55-60%, no RWMA, GR1DD, mod AS w/ mean gradient of 28 mm Hg     . Obesity   . Onychomycosis   . Osteoarthritis     Patient Active Problem List   Diagnosis Date Noted  . Venous ulcer (HCC) 07/08/2017  . Osteoarthritis 05/28/2017  . Varicose veins of both lower extremities with pain 04/10/2017  . Iron deficiency anemia 11/25/2016  . Chronic venous insufficiency 10/04/2016  . Lymphedema 06/08/2016  . Morbid obesity (HCC) 03/26/2015  . Coronary artery disease, non-occlusive 06/05/2013  . Hypertension 06/05/2013  . Hyperlipidemia 02/23/2012  . Diabetes mellitus (HCC) 02/23/2012  .  Moderate aortic stenosis 02/23/2012    Past Surgical History:  Procedure Laterality Date  . CARDIAC CATHETERIZATION  11/2002  . CARDIAC CATHETERIZATION  05/21/2013   showing 30% oLM, 30% D1, 20% mLCx, 30% pRCA; EF 60%, severe AS (mean gradient 28 mmHg, peak 26, area 0.9)  . CHOLECYSTECTOMY  2001  . TRANSESOPHAGEAL ECHOCARDIOGRAM  05/22/2013   Preserved EF, moderate AS (valve area by planimetry between 1.0-1.26 cm sq), moderate aortic arch and descending aortic atherosclerosis.   . TRANSTHORACIC ECHOCARDIOGRAM  05/19/2013   EF 60-65%, impaired diastolic function, mild LA dilatation, mild MR/AI/TR, mod AS, high normal RVSP (30.4 mmHg)    Prior to Admission medications   Medication Sig Start Date End Date Taking? Authorizing Provider  amoxicillin-clavulanate (AUGMENTIN) 875-125 MG tablet Take 1 tablet by mouth 2 (two) times daily for 10 days. 08/05/17 08/15/17  Arnaldo Natal, MD  atorvastatin (LIPITOR) 80 MG tablet Take 1 tablet (80 mg total) by mouth daily. 06/01/17   Glori Luis, MD  clopidogrel (PLAVIX) 75 MG tablet Take 1 tablet (75 mg total) by mouth daily. 06/01/17   Glori Luis, MD  doxycycline (VIBRAMYCIN) 100 MG capsule Take 1 capsule (100 mg total) by mouth 2 (two) times daily. 07/07/17   Schnier, Latina Craver, MD  ferrous sulfate 325 (65 FE) MG EC tablet Take 1 tablet (325 mg total) by mouth 2 (two)  times daily. 06/06/17   Glori Luis, MD  furosemide (LASIX) 20 MG tablet Take 1 tablet (20 mg total) by mouth daily. 06/01/17 06/01/18  Glori Luis, MD  lisinopril (PRINIVIL,ZESTRIL) 10 MG tablet Take 1 tablet (10 mg total) by mouth daily. 06/01/17   Glori Luis, MD  metoprolol succinate (TOPROL-XL) 25 MG 24 hr tablet Take 1 tablet (25 mg total) by mouth daily. 06/01/17   Glori Luis, MD  potassium chloride (K-DUR) 10 MEQ tablet Take 1 tablet (10 mEq total) by mouth daily. 06/01/17   Glori Luis, MD    Allergies Aspirin  Family History  Problem Relation  Age of Onset  . Heart disease Mother   . Heart disease Father 31  . Stroke Brother     Social History Social History   Tobacco Use  . Smoking status: Former Smoker    Packs/Brashear: 1.50    Years: 20.00    Pack years: 30.00    Types: Cigarettes    Last attempt to quit: 02/22/1981    Years since quitting: 36.4  . Smokeless tobacco: Never Used  Substance Use Topics  . Alcohol use: No  . Drug use: No    Review of Systems  Constitutional: No fever/chills Eyes: No visual changes. ENT: No sore throat. Cardiovascular: Denies chest pain. Respiratory: Denies shortness of breath. Gastrointestinal: No abdominal pain.  No nausea, no vomiting.  No diarrhea.  No constipation. Genitourinary: Negative for dysuria. Musculoskeletal: Negative for back pain. Skin: Negative for rash. Neurological: Negative for headaches, focal weakness   ____________________________________________   PHYSICAL EXAM:  VITAL SIGNS: ED Triage Vitals  Enc Vitals Group     BP 08/05/17 1405 (!) 139/54     Pulse Rate 08/05/17 1405 95     Resp 08/05/17 1405 20     Temp 08/05/17 1405 99 F (37.2 C)     Temp Source 08/05/17 1405 Oral     SpO2 08/05/17 1405 98 %     Weight 08/05/17 1405 220 lb (99.8 kg)     Height 08/05/17 1405 5\' 11"  (1.803 m)     Head Circumference --      Peak Flow --      Pain Score 08/05/17 1430 5     Pain Loc --      Pain Edu? --      Excl. in GC? --     Constitutional: Alert and oriented. Well appearing and in no acute distress. Eyes: Conjunctivae are normal. PER. EOMI. Head: Atraumatic. Nose: No congestion/rhinnorhea. Mouth/Throat: Mucous membranes are moist.  Oropharynx non-erythematous. Neck: No stridor.  Cardiovascular: Normal rate, regular rhythm. Grossly normal heart sounds.  Good peripheral circulation. Respiratory: Normal respiratory effort.  No retractions. Lungs CTAB. Gastrointestinal: Soft and nontender. No distention. No abdominal bruits. No CVA  tenderness. Musculoskeletal: No lower extremity tenderness bilateral edema. there are chronic skin changes in the right leg and right leg is more swollen than the left. Right leg is red and very warm I can feel it through his jeans.  Neurologic:  Normal speech and language. No gross focal neurologic deficits are appreciated. cranial nerves II through XII are intact cerebellar finger-nose rapid alternating movements in the hands are normal motor strength is 5 over 5 throughout Skin:  Skin is warm, C note on the legs Psychiatric: Mood and affect are normal. Speech and behavior are normal.  ____________________________________________   LABS (all labs ordered are listed, but only abnormal results are displayed)  Labs Reviewed  CBC WITH DIFFERENTIAL/PLATELET - Abnormal; Notable for the following components:      Result Value   HCT 39.2 (*)    RDW 16.2 (*)    All other components within normal limits  COMPREHENSIVE METABOLIC PANEL - Abnormal; Notable for the following components:   Glucose, Bld 109 (*)    Calcium 8.8 (*)    ALT 13 (*)    Total Bilirubin 2.1 (*)    All other components within normal limits   ____________________________________________  EKG   ____________________________________________  RADIOLOGY  ED MD interpretation:  CT read by radiology as no acute changes but sometimes sphenoid fullness possibly sinusitis what I understand  Official radiology report(s): Ct Head Wo Contrast  Result Date: 08/05/2017 CLINICAL DATA:  Headache, primarily frontal EXAM: CT HEAD WITHOUT CONTRAST TECHNIQUE: Contiguous axial images were obtained from the base of the skull through the vertex without intravenous contrast. COMPARISON:  May 26, 2014 FINDINGS: Brain: There is age related volume loss. There is no intracranial mass, hemorrhage, extra-axial fluid collection, or midline shift. There is slight small vessel disease in the centra semiovale bilaterally. Elsewhere gray-white  compartments appear normal. Vascular: No evident hyperdense vessel. There is calcification in each carotid siphon region. Skull: Bony calvarium appears intact. Sinuses/Orbits: There is mucosal thickening in both sphenoid sinuses, more on the left than on the right. There is opacification in several ethmoid air cells bilaterally. The patient has had surgery in each ethmoid air cell region. Visualized orbits appear symmetric bilaterally. Other: Visualized mastoid air cells are clear. IMPRESSION: Age related volume loss with slight periventricular small vessel disease. No acute infarct evident. No mass or hemorrhage. Foci of arterial vascular calcification noted. Postoperative change noted in the ethmoid air cells. There are areas of paranasal sinus disease, most notably in the left sphenoid sinus posteriorly. There are foci of arterial vascular calcification evident. Electronically Signed   By: Bretta BangWilliam  Woodruff III M.D.   On: 08/05/2017 15:08    ____________________________________________   PROCEDURES  Procedure(s) performed:   Procedures  Critical Care performed:   ____________________________________________   INITIAL IMPRESSION / ASSESSMENT AND PLAN / ED COURSE   I offered the patient ultrasound of his leg but he declines this. He says his leg is been swelling better and worse for quite some time. He will take the Augmentin for the sinus infection and the redness of the heat in his leg. He will follow-up with his doctor on Monday and return if he is worse.        ____________________________________________   FINAL CLINICAL IMPRESSION(S) / ED DIAGNOSES  Final diagnoses:  Sinusitis, unspecified chronicity, unspecified location  Cellulitis, unspecified cellulitis site     ED Discharge Orders        Ordered    amoxicillin-clavulanate (AUGMENTIN) 875-125 MG tablet  2 times daily     08/05/17 2017       Note:  This document was prepared using Dragon voice recognition  software and may include unintentional dictation errors.    Arnaldo NatalMalinda, Jahaad Penado F, MD 08/05/17 2021

## 2017-08-05 NOTE — ED Triage Notes (Signed)
Pt arrived via POV with reports of a headache that started last night. Pt states it affected his sleep as well. Pt denies any cough, n/v, or photosensitivity. Pt states the pain is frontal but denies any pain to palpation.    Pt also c/o rash to right leg that has been present for 3 weeks. Pt states he thinks the rash is from a medication he started several weeks ago.  Pt is being followed by vascular for venous ulcers.    Pt unable to provide any other detail to headache. States he couldn't sleep and did not try any medications to help with the pain.

## 2017-08-05 NOTE — Discharge Instructions (Addendum)
please take the Augmentin 1 pill twice a Barre with food. If you don't take it with food it may give you diarrhea. Please follow-up with your regular doctor on Monday to see how your leg is doing. I am worried about the redness and swelling there. You may have an infection. CT scan showed you also have a sinus infection the Augmentin should work well on that as well. you can get some salt water nasal spray and sprayed up he noticed 5 or 6 times a Swails that may help the sinuses drain 2. Please return if you're worse at all if you get a fever feels sicker or have worse pain.

## 2017-08-05 NOTE — ED Notes (Signed)
D/w Dr. Scotty CourtStafford, pt's headache onset and current sxs, also pt being followed by vascular. New order given for CT head. No labs at this time.

## 2017-08-05 NOTE — ED Notes (Signed)
Pt verbalizes understanding of discharge instructions.

## 2017-08-06 ENCOUNTER — Inpatient Hospital Stay
Admission: EM | Admit: 2017-08-06 | Discharge: 2017-08-10 | DRG: 602 | Disposition: A | Discharge status: Skilled Nursing Facility | Payer: Medicare Other | Attending: Internal Medicine | Admitting: Internal Medicine

## 2017-08-06 ENCOUNTER — Other Ambulatory Visit: Payer: Self-pay

## 2017-08-06 ENCOUNTER — Emergency Department: Payer: Medicare Other

## 2017-08-06 DIAGNOSIS — E785 Hyperlipidemia, unspecified: Secondary | ICD-10-CM | POA: Diagnosis present

## 2017-08-06 DIAGNOSIS — Z86010 Personal history of colonic polyps: Secondary | ICD-10-CM

## 2017-08-06 DIAGNOSIS — Z659 Problem related to unspecified psychosocial circumstances: Secondary | ICD-10-CM

## 2017-08-06 DIAGNOSIS — Z794 Long term (current) use of insulin: Secondary | ICD-10-CM

## 2017-08-06 DIAGNOSIS — E876 Hypokalemia: Secondary | ICD-10-CM | POA: Diagnosis not present

## 2017-08-06 DIAGNOSIS — I5032 Chronic diastolic (congestive) heart failure: Secondary | ICD-10-CM

## 2017-08-06 DIAGNOSIS — Z79899 Other long term (current) drug therapy: Secondary | ICD-10-CM

## 2017-08-06 DIAGNOSIS — I2510 Atherosclerotic heart disease of native coronary artery without angina pectoris: Secondary | ICD-10-CM | POA: Diagnosis present

## 2017-08-06 DIAGNOSIS — E119 Type 2 diabetes mellitus without complications: Secondary | ICD-10-CM | POA: Diagnosis present

## 2017-08-06 DIAGNOSIS — E669 Obesity, unspecified: Secondary | ICD-10-CM | POA: Diagnosis present

## 2017-08-06 DIAGNOSIS — I878 Other specified disorders of veins: Secondary | ICD-10-CM | POA: Diagnosis present

## 2017-08-06 DIAGNOSIS — L03116 Cellulitis of left lower limb: Secondary | ICD-10-CM | POA: Diagnosis present

## 2017-08-06 DIAGNOSIS — I890 Lymphedema, not elsewhere classified: Secondary | ICD-10-CM | POA: Diagnosis present

## 2017-08-06 DIAGNOSIS — Z87891 Personal history of nicotine dependence: Secondary | ICD-10-CM

## 2017-08-06 DIAGNOSIS — Z6837 Body mass index (BMI) 37.0-37.9, adult: Secondary | ICD-10-CM

## 2017-08-06 DIAGNOSIS — L03115 Cellulitis of right lower limb: Secondary | ICD-10-CM | POA: Diagnosis not present

## 2017-08-06 DIAGNOSIS — Z7902 Long term (current) use of antithrombotics/antiplatelets: Secondary | ICD-10-CM

## 2017-08-06 DIAGNOSIS — M1990 Unspecified osteoarthritis, unspecified site: Secondary | ICD-10-CM | POA: Diagnosis present

## 2017-08-06 DIAGNOSIS — M6281 Muscle weakness (generalized): Secondary | ICD-10-CM | POA: Diagnosis not present

## 2017-08-06 DIAGNOSIS — M7989 Other specified soft tissue disorders: Secondary | ICD-10-CM | POA: Diagnosis not present

## 2017-08-06 DIAGNOSIS — Z886 Allergy status to analgesic agent status: Secondary | ICD-10-CM

## 2017-08-06 DIAGNOSIS — I872 Venous insufficiency (chronic) (peripheral): Secondary | ICD-10-CM | POA: Diagnosis present

## 2017-08-06 DIAGNOSIS — I10 Essential (primary) hypertension: Secondary | ICD-10-CM | POA: Diagnosis present

## 2017-08-06 DIAGNOSIS — Z609 Problem related to social environment, unspecified: Secondary | ICD-10-CM | POA: Diagnosis not present

## 2017-08-06 DIAGNOSIS — K219 Gastro-esophageal reflux disease without esophagitis: Secondary | ICD-10-CM | POA: Diagnosis present

## 2017-08-06 DIAGNOSIS — I5033 Acute on chronic diastolic (congestive) heart failure: Secondary | ICD-10-CM | POA: Diagnosis present

## 2017-08-06 DIAGNOSIS — I350 Nonrheumatic aortic (valve) stenosis: Secondary | ICD-10-CM | POA: Diagnosis present

## 2017-08-06 LAB — BASIC METABOLIC PANEL
Anion gap: 9 (ref 5–15)
BUN: 20 mg/dL (ref 6–20)
CALCIUM: 9 mg/dL (ref 8.9–10.3)
CHLORIDE: 102 mmol/L (ref 101–111)
CO2: 27 mmol/L (ref 22–32)
CREATININE: 0.99 mg/dL (ref 0.61–1.24)
Glucose, Bld: 112 mg/dL — ABNORMAL HIGH (ref 65–99)
Potassium: 3.6 mmol/L (ref 3.5–5.1)
SODIUM: 138 mmol/L (ref 135–145)

## 2017-08-06 LAB — CBC WITH DIFFERENTIAL/PLATELET
BASOS PCT: 1 %
Basophils Absolute: 0.1 10*3/uL (ref 0–0.1)
EOS ABS: 0.2 10*3/uL (ref 0–0.7)
EOS PCT: 2 %
HCT: 38.5 % — ABNORMAL LOW (ref 40.0–52.0)
HEMOGLOBIN: 12.7 g/dL — AB (ref 13.0–18.0)
Lymphocytes Relative: 18 %
Lymphs Abs: 1.2 10*3/uL (ref 1.0–3.6)
MCH: 26.4 pg (ref 26.0–34.0)
MCHC: 33.1 g/dL (ref 32.0–36.0)
MCV: 79.8 fL — ABNORMAL LOW (ref 80.0–100.0)
Monocytes Absolute: 0.6 10*3/uL (ref 0.2–1.0)
Monocytes Relative: 8 %
NEUTROS PCT: 71 %
Neutro Abs: 4.7 10*3/uL (ref 1.4–6.5)
PLATELETS: 159 10*3/uL (ref 150–440)
RBC: 4.82 MIL/uL (ref 4.40–5.90)
RDW: 16.4 % — AB (ref 11.5–14.5)
WBC: 6.7 10*3/uL (ref 3.8–10.6)

## 2017-08-06 LAB — BRAIN NATRIURETIC PEPTIDE: B NATRIURETIC PEPTIDE 5: 163 pg/mL — AB (ref 0.0–100.0)

## 2017-08-06 NOTE — ED Provider Notes (Signed)
[facility] Emergency Department Provider Note   ____________________________________________   First MD Initiated Contact with Patient 08/06/17 2130     (approximate)  I have reviewed the triage vital signs and the nursing notes.   HISTORY  Chief Complaint Leg Pain and Weakness    HPI [name] is a 84 y.o. male with an extensive previous medical history including venous stasis ulcers and lymphedema, recent diagnosis of cellulitis as well as sinusitis  Patient presents today reports his legs have become very heavy, he is unable to stand up because his legs are uncomfortable, he is also noticed increased discomfort in both lower legs around his ankles.  He has notable swelling around both ankles.  Reports his right leg is always swollen, left leg slightly swollen all the time.  He does however note that there seems to be increased redness especially in the left lower leg.  EMS reported patient's house infected with bedbugs.  Patient was given a prescription for amoxicillin, has not yet filled it.  Denies being in pain.  Reports that both legs feel very heavy making hard for him to stand.  Past Medical History:  Diagnosis Date  . Aortic atherosclerosis (HCC) 05/2013   Per TEE  . Arrhythmia   . Asthma   . Colon polyp   . Coronary artery disease    a. Nonobstructive by 2004 cath b. Nonobstructive by 2015 cath in setting of NSTEMI  . Diabetes mellitus without complication (HCC)    a. Hgb A1C 5.9% 05/2013  . GERD (gastroesophageal reflux disease)   . Heart murmur   . Hiatal hernia   . Hyperlipidemia   . Hypertension   . Moderate aortic stenosis    a. echo 2013: EF 55%, nl wall motion, mild DD, mild LVH, trace MR, moderate AS with peak gradient of 55 mm Hg; b. TEE 05/2013: EF EF 60-65%, borderline LVH, mild MR/AI, mod AS with valve area 1.0-1.26; c. echo 10/2014: 55-60%, no RWMA, GR1DD, mod AS w/ mean gradient of 28 mm Hg     . Obesity   . Onychomycosis    . Osteoarthritis     Patient Active Problem List   Diagnosis Date Noted  . Cellulitis 08/06/2017  . Venous ulcer (HCC) 07/08/2017  . Osteoarthritis 05/28/2017  . Varicose veins of both lower extremities with pain 04/10/2017  . Iron deficiency anemia 11/25/2016  . Chronic venous insufficiency 10/04/2016  . Lymphedema 06/08/2016  . Morbid obesity (HCC) 03/26/2015  . Coronary artery disease, non-occlusive 06/05/2013  . Hypertension 06/05/2013  . Hyperlipidemia 02/23/2012  . Diabetes mellitus (HCC) 02/23/2012  . Moderate aortic stenosis 02/23/2012    Past Surgical History:  Procedure Laterality Date  . CARDIAC CATHETERIZATION  11/2002  . CARDIAC CATHETERIZATION  05/21/2013   showing 30% oLM, 30% D1, 20% mLCx, 30% pRCA; EF 60%, severe AS (mean gradient 28 mmHg, peak 26, area 0.9)  . CHOLECYSTECTOMY  2001  . TRANSESOPHAGEAL ECHOCARDIOGRAM  05/22/2013   Preserved EF, moderate AS (valve area by planimetry between 1.0-1.26 cm sq), moderate aortic arch and descending aortic atherosclerosis.   . TRANSTHORACIC ECHOCARDIOGRAM  05/19/2013   EF 60-65%, impaired diastolic function, mild LA dilatation, mild MR/AI/TR, mod AS, high normal RVSP (30.4 mmHg)    Prior to Admission medications   Medication Sig Start Date End Date Taking? Authorizing Provider  amoxicillin-clavulanate (AUGMENTIN) 875-125 MG tablet Take 1 tablet by mouth 2 (two) times daily for 10 days. 08/05/17 08/15/17  Arnaldo Natal, MD  atorvastatin (LIPITOR) 80 MG tablet Take 1 tablet (80 mg total) by mouth daily. 06/01/17   Glori LuisSonnenberg, Eric G, MD  clopidogrel (PLAVIX) 75 MG tablet Take 1 tablet (75 mg total) by mouth daily. 06/01/17   Glori LuisSonnenberg, Eric G, MD  doxycycline (VIBRAMYCIN) 100 MG capsule Take 1 capsule (100 mg total) by mouth 2 (two) times daily. 07/07/17   Schnier, Latina CraverGregory G, MD  ferrous sulfate 325 (65 FE) MG EC tablet Take 1 tablet (325 mg total) by mouth 2 (two) times daily. 06/06/17   Glori LuisSonnenberg, Eric G, MD  furosemide (LASIX)  20 MG tablet Take 1 tablet (20 mg total) by mouth daily. 06/01/17 06/01/18  Glori LuisSonnenberg, Eric G, MD  lisinopril (PRINIVIL,ZESTRIL) 10 MG tablet Take 1 tablet (10 mg total) by mouth daily. 06/01/17   Glori LuisSonnenberg, Eric G, MD  metoprolol succinate (TOPROL-XL) 25 MG 24 hr tablet Take 1 tablet (25 mg total) by mouth daily. 06/01/17   Glori LuisSonnenberg, Eric G, MD  potassium chloride (K-DUR) 10 MEQ tablet Take 1 tablet (10 mEq total) by mouth daily. 06/01/17   Glori LuisSonnenberg, Eric G, MD    Allergies Aspirin  Family History  Problem Relation Age of Onset  . Heart disease Mother   . Heart disease Father 2260  . Stroke Brother     Social History Social History   Tobacco Use  . Smoking status: Former Smoker    Packs/Violet: 1.50    Years: 20.00    Pack years: 30.00    Types: Cigarettes    Last attempt to quit: 02/22/1981    Years since quitting: 36.4  . Smokeless tobacco: Never Used  Substance Use Topics  . Alcohol use: No  . Drug use: No    Review of Systems Constitutional: No fever/chills Eyes: No visual changes. ENT: No sore throat. Cardiovascular: Denies chest pain. Respiratory: Denies shortness of breath. Gastrointestinal: No abdominal pain.  No nausea, no vomiting.  Genitourinary: Negative for dysuria. Musculoskeletal: Negative for back pain.  See HPI Skin: See HPI Neurological: Negative for headaches, focal weakness or numbness.    ____________________________________________   PHYSICAL EXAM:  VITAL SIGNS: ED Triage Vitals  Enc Vitals Group     BP 08/06/17 2107 (!) 145/51     Pulse Rate 08/06/17 2107 85     Resp 08/06/17 2107 20     Temp 08/06/17 2107 98.4 F (36.9 C)     Temp Source 08/06/17 2107 Oral     SpO2 08/06/17 2107 99 %     Weight 08/06/17 2105 220 lb (99.8 kg)     Height 08/06/17 2105 5\' 11"  (1.803 m)     Head Circumference --      Peak Flow --      Pain Score 08/06/17 2105 0     Pain Loc --      Pain Edu? --      Excl. in GC? --     Constitutional: Alert and  oriented.  Somewhat unkempt, in no distress. Eyes: Conjunctivae are normal. Head: Atraumatic. Nose: No congestion/rhinnorhea. Mouth/Throat: Mucous membranes are moist. Neck: No stridor.   Cardiovascular: Normal rate, regular rhythm. Grossly normal heart sounds.  Good peripheral circulation. Respiratory: Normal respiratory effort.  No retractions. Lungs CTAB. Gastrointestinal: Soft and nontender. No distention.  Obese. Musculoskeletal notable bilateral lower extremity edema, right greater than left.  Lymphedema bilateral, right greater than left.  There is erythema circumferentially around the lower shins bilaterally with warmth and redness, without open skin lesions noted. Neurologic:  Normal speech  and language. No gross focal neurologic deficits are appreciated.  Patient exhibits lower extremity weakness bilateral, reports his finger legs feel too heavy to lift. Skin:  Skin is warm, dry and intact.  With venous stasis lower extremities, also erythema and warmth involving the lower extremities bilateral circumferentially. Psychiatric: Mood and affect are normal. Speech and behavior are normal.  ____________________________________________   LABS (all labs ordered are listed, but only abnormal results are displayed)  Labs Reviewed  CBC WITH DIFFERENTIAL/PLATELET - Abnormal; Notable for the following components:      Result Value   Hemoglobin 12.7 (*)    HCT 38.5 (*)    MCV 79.8 (*)    RDW 16.4 (*)    All other components within normal limits  BASIC METABOLIC PANEL - Abnormal; Notable for the following components:   Glucose, Bld 112 (*)    All other components within normal limits  BRAIN NATRIURETIC PEPTIDE - Abnormal; Notable for the following components:   B Natriuretic Peptide 163.0 (*)    All other components within normal limits  CULTURE, BLOOD (ROUTINE X 2)  CULTURE, BLOOD (ROUTINE X 2)    ____________________________________________  EKG   ____________________________________________  RADIOLOGY      ____________________________________________   PROCEDURES  Procedure(s) performed: None  Procedures  Critical Care performed: No  ____________________________________________   INITIAL IMPRESSION / ASSESSMENT AND PLAN / ED COURSE  Pertinent labs & imaging results that were available during my care of the patient were reviewed by me and considered in my medical decision making (see chart for details).  Patient presents for evaluation of difficulty walking, redness and swelling in his lower legs bilaterally.  On exam, concern for lower leg cellulitis, bilateral, slightly worse right versus left.  Will exclude DVT with ultrasound, though suspect primarily bilateral lower extremity cellulitis.  No evidence of sepsis, he does however likely have a very poor social situation reports his wife is no longer living in his home with him and EMS reports very poor social situation with home disrepair  ----------------------------------------- 11:51 PM on 08/06/2017 -----------------------------------------  Patient agreeable with plan for admission, discussed with hospitalist Dr. Anne Hahn antibiotic choice and recommends initiation of vancomycin now, and will follow up with additional antibiotics after evaluation for admission.      ____________________________________________   FINAL CLINICAL IMPRESSION(S) / ED DIAGNOSES  Final diagnoses:  Bilateral lower leg cellulitis  Poor social situation      NEW MEDICATIONS STARTED DURING THIS VISIT:  New Prescriptions   No medications on file     Note:  This document was prepared using Dragon voice recognition software and may include unintentional dictation errors.     Sharyn Creamer, MD 08/06/17 2352

## 2017-08-06 NOTE — ED Triage Notes (Addendum)
Pt arrives to ED via [facility] from home with c/o leg pain and inability to ambulate. Pt reports being seen here recently, d/x'd with LE cellulitis and r/x'd ABX. Pt arrives with d/c paperwork from 3/22, r/x for Augmentin BID x10 days still attached and unfilled. Pt denies pain; only c/o is leg weakness. Of note; EMS reports pt's residence is "infested with bedbugs"; one insect taken off pt to be given to [facility] for confirmation.

## 2017-08-07 ENCOUNTER — Other Ambulatory Visit: Payer: Self-pay

## 2017-08-07 DIAGNOSIS — E876 Hypokalemia: Secondary | ICD-10-CM | POA: Diagnosis not present

## 2017-08-07 DIAGNOSIS — I5032 Chronic diastolic (congestive) heart failure: Secondary | ICD-10-CM

## 2017-08-07 DIAGNOSIS — I890 Lymphedema, not elsewhere classified: Secondary | ICD-10-CM | POA: Diagnosis present

## 2017-08-07 DIAGNOSIS — Z79899 Other long term (current) drug therapy: Secondary | ICD-10-CM | POA: Diagnosis not present

## 2017-08-07 DIAGNOSIS — L03115 Cellulitis of right lower limb: Secondary | ICD-10-CM | POA: Diagnosis not present

## 2017-08-07 DIAGNOSIS — K219 Gastro-esophageal reflux disease without esophagitis: Secondary | ICD-10-CM | POA: Diagnosis present

## 2017-08-07 DIAGNOSIS — L0390 Cellulitis, unspecified: Secondary | ICD-10-CM | POA: Diagnosis not present

## 2017-08-07 DIAGNOSIS — R4182 Altered mental status, unspecified: Secondary | ICD-10-CM | POA: Diagnosis not present

## 2017-08-07 DIAGNOSIS — E785 Hyperlipidemia, unspecified: Secondary | ICD-10-CM | POA: Diagnosis present

## 2017-08-07 DIAGNOSIS — L03116 Cellulitis of left lower limb: Secondary | ICD-10-CM | POA: Diagnosis not present

## 2017-08-07 DIAGNOSIS — Z6837 Body mass index (BMI) 37.0-37.9, adult: Secondary | ICD-10-CM | POA: Diagnosis not present

## 2017-08-07 DIAGNOSIS — Z794 Long term (current) use of insulin: Secondary | ICD-10-CM | POA: Diagnosis not present

## 2017-08-07 DIAGNOSIS — Z86010 Personal history of colonic polyps: Secondary | ICD-10-CM | POA: Diagnosis not present

## 2017-08-07 DIAGNOSIS — I5033 Acute on chronic diastolic (congestive) heart failure: Secondary | ICD-10-CM | POA: Diagnosis present

## 2017-08-07 DIAGNOSIS — I2510 Atherosclerotic heart disease of native coronary artery without angina pectoris: Secondary | ICD-10-CM | POA: Diagnosis present

## 2017-08-07 DIAGNOSIS — Z7902 Long term (current) use of antithrombotics/antiplatelets: Secondary | ICD-10-CM | POA: Diagnosis not present

## 2017-08-07 DIAGNOSIS — R278 Other lack of coordination: Secondary | ICD-10-CM | POA: Diagnosis not present

## 2017-08-07 DIAGNOSIS — E669 Obesity, unspecified: Secondary | ICD-10-CM | POA: Diagnosis present

## 2017-08-07 DIAGNOSIS — E119 Type 2 diabetes mellitus without complications: Secondary | ICD-10-CM | POA: Diagnosis not present

## 2017-08-07 DIAGNOSIS — I872 Venous insufficiency (chronic) (peripheral): Secondary | ICD-10-CM

## 2017-08-07 DIAGNOSIS — M6281 Muscle weakness (generalized): Secondary | ICD-10-CM | POA: Diagnosis not present

## 2017-08-07 DIAGNOSIS — Z886 Allergy status to analgesic agent status: Secondary | ICD-10-CM | POA: Diagnosis not present

## 2017-08-07 DIAGNOSIS — Z87891 Personal history of nicotine dependence: Secondary | ICD-10-CM | POA: Diagnosis not present

## 2017-08-07 DIAGNOSIS — R41841 Cognitive communication deficit: Secondary | ICD-10-CM | POA: Diagnosis not present

## 2017-08-07 DIAGNOSIS — I10 Essential (primary) hypertension: Secondary | ICD-10-CM | POA: Diagnosis not present

## 2017-08-07 DIAGNOSIS — I878 Other specified disorders of veins: Secondary | ICD-10-CM | POA: Diagnosis present

## 2017-08-07 DIAGNOSIS — I350 Nonrheumatic aortic (valve) stenosis: Secondary | ICD-10-CM | POA: Diagnosis present

## 2017-08-07 DIAGNOSIS — M79604 Pain in right leg: Secondary | ICD-10-CM | POA: Diagnosis not present

## 2017-08-07 DIAGNOSIS — M1990 Unspecified osteoarthritis, unspecified site: Secondary | ICD-10-CM | POA: Diagnosis present

## 2017-08-07 DIAGNOSIS — R2681 Unsteadiness on feet: Secondary | ICD-10-CM | POA: Diagnosis not present

## 2017-08-07 DIAGNOSIS — I5030 Unspecified diastolic (congestive) heart failure: Secondary | ICD-10-CM | POA: Diagnosis not present

## 2017-08-07 LAB — GLUCOSE, CAPILLARY
GLUCOSE-CAPILLARY: 76 mg/dL (ref 65–99)
GLUCOSE-CAPILLARY: 117 mg/dL — AB (ref 65–99)
GLUCOSE-CAPILLARY: 104 mg/dL — AB (ref 65–99)
Glucose-Capillary: 127 mg/dL — ABNORMAL HIGH (ref 65–99)

## 2017-08-07 MED ORDER — CLOPIDOGREL BISULFATE 75 MG PO TABS
75.0000 mg | ORAL_TABLET | Freq: Every day | ORAL | Status: DC
  Administered 2017-08-07 – 2017-08-10 (×4): 75 mg via ORAL
  Filled 2017-08-07 (×4): qty 1

## 2017-08-07 MED ORDER — VANCOMYCIN HCL IN DEXTROSE 1-5 GM/200ML-% IV SOLN
1000.0000 mg | Freq: Two times a day (BID) | INTRAVENOUS | Status: DC
  Administered 2017-08-07 – 2017-08-08 (×3): 1000 mg via INTRAVENOUS
  Filled 2017-08-07 (×4): qty 200

## 2017-08-07 MED ORDER — FUROSEMIDE 10 MG/ML IJ SOLN
40.0000 mg | Freq: Three times a day (TID) | INTRAMUSCULAR | Status: DC
  Administered 2017-08-07 – 2017-08-08 (×4): 40 mg via INTRAVENOUS
  Filled 2017-08-07 (×4): qty 4

## 2017-08-07 MED ORDER — SODIUM CHLORIDE 0.9 % IV SOLN
3.0000 g | Freq: Four times a day (QID) | INTRAVENOUS | Status: DC
  Administered 2017-08-07 – 2017-08-09 (×9): 3 g via INTRAVENOUS
  Filled 2017-08-07 (×11): qty 3

## 2017-08-07 MED ORDER — INSULIN ASPART 100 UNIT/ML ~~LOC~~ SOLN
0.0000 [IU] | Freq: Three times a day (TID) | SUBCUTANEOUS | Status: DC
  Administered 2017-08-07: 1 [IU] via SUBCUTANEOUS
  Filled 2017-08-07: qty 1

## 2017-08-07 MED ORDER — OXYCODONE HCL 5 MG PO TABS
5.0000 mg | ORAL_TABLET | ORAL | Status: DC | PRN
  Administered 2017-08-07 – 2017-08-08 (×2): 5 mg via ORAL
  Filled 2017-08-07 (×2): qty 1

## 2017-08-07 MED ORDER — ONDANSETRON HCL 4 MG PO TABS: 4.0000 mg | ORAL_TABLET | Freq: Four times a day (QID) | ORAL | Status: DC | PRN

## 2017-08-07 MED ORDER — ACETAMINOPHEN 650 MG RE SUPP
650.0000 mg | Freq: Four times a day (QID) | RECTAL | Status: DC | PRN
Start: 2017-08-07 — End: 2017-08-10

## 2017-08-07 MED ORDER — SODIUM CHLORIDE 0.9 % IV SOLN
1.0000 g | Freq: Three times a day (TID) | INTRAVENOUS | Status: DC
  Administered 2017-08-07: 1 g via INTRAVENOUS
  Filled 2017-08-07 (×3): qty 1

## 2017-08-07 MED ORDER — INSULIN ASPART 100 UNIT/ML ~~LOC~~ SOLN: 0.0000 [IU] | Freq: Every day | SUBCUTANEOUS | Status: DC

## 2017-08-07 MED ORDER — ENOXAPARIN SODIUM 40 MG/0.4ML ~~LOC~~ SOLN
40.0000 mg | SUBCUTANEOUS | Status: DC
  Administered 2017-08-07 – 2017-08-09 (×3): 40 mg via SUBCUTANEOUS
  Filled 2017-08-07 (×3): qty 0.4

## 2017-08-07 MED ORDER — ATORVASTATIN CALCIUM 20 MG PO TABS
80.0000 mg | ORAL_TABLET | Freq: Every day | ORAL | Status: DC
  Administered 2017-08-07 – 2017-08-10 (×4): 80 mg via ORAL
  Filled 2017-08-07 (×4): qty 4

## 2017-08-07 MED ORDER — SODIUM CHLORIDE 0.9 % IV SOLN: 1.0000 g | Freq: Once | INTRAVENOUS | Status: DC

## 2017-08-07 MED ORDER — ONDANSETRON HCL 4 MG/2ML IJ SOLN: 4.0000 mg | Freq: Four times a day (QID) | INTRAMUSCULAR | Status: DC | PRN

## 2017-08-07 MED ORDER — FUROSEMIDE 20 MG PO TABS: 20.0000 mg | ORAL_TABLET | Freq: Every day | ORAL | Status: DC

## 2017-08-07 MED ORDER — VANCOMYCIN HCL IN DEXTROSE 1-5 GM/200ML-% IV SOLN
1000.0000 mg | Freq: Once | INTRAVENOUS | Status: AC
Start: 2017-08-07 — End: 2017-08-07
  Administered 2017-08-07: 1000 mg via INTRAVENOUS
  Filled 2017-08-07: qty 200

## 2017-08-07 MED ORDER — LISINOPRIL 10 MG PO TABS
10.0000 mg | ORAL_TABLET | Freq: Every day | ORAL | Status: DC
  Administered 2017-08-07 – 2017-08-10 (×3): 10 mg via ORAL
  Filled 2017-08-07 (×4): qty 1

## 2017-08-07 MED ORDER — ACETAMINOPHEN 325 MG PO TABS: 650.0000 mg | ORAL_TABLET | Freq: Four times a day (QID) | ORAL | Status: DC | PRN

## 2017-08-07 MED ORDER — METOPROLOL SUCCINATE ER 25 MG PO TB24
25.0000 mg | ORAL_TABLET | Freq: Every day | ORAL | Status: DC
  Administered 2017-08-08 – 2017-08-10 (×3): 25 mg via ORAL
  Filled 2017-08-07 (×4): qty 1

## 2017-08-07 NOTE — Consult Note (Signed)
Cardiology Consultation:   Patient IDArchie [name]: [name]; [id number]; 09-Jan-1931   Admit date: 08/06/2017 Date of Consult: 08/07/2017  Primary Care Provider: Glori LuisSonnenberg, Jeffery G, MD Primary Cardiologist: Mariah MillingGollan Primary Electrophysiologist:     Patient Profile:   [name] is a 84 y.o. male with a hx of moderate to severe aortic stenosis, diastolic heart failure who is being seen today for the evaluation of lower extremity edema at the request of Sital Mody.  History of Present Illness:   [name] is an 84 year old male with a history of aortic atherosclerosis, coronary artery disease nonobstructive by cath in 2015, diabetes, moderate to severe aortic stenosis, and chronic diastolic heart failure.  He presented to the hospital yesterday with leg pain and weakness.  He was found to have cellulitis in his legs.  He also has a history of venous stasis ulcers and lymphedema as well as cellulitis.  He was admitted to the hospital for therapy for cellulitis.  He was started on vancomycin and meropenem which has since been changed to Unasyn..  Cardiology was consulted for lower extremity edema.  His most recent echo showed an EF of 35-70% with moderate to severe aortic stenosis.  BNP on admission was 163.  His main complaint today is of left leg weakness.  Of note, per EMS, his house was infected with bedbugs.   Past Medical History:  Diagnosis Date  . Aortic atherosclerosis (HCC) 05/2013   Per TEE  . Arrhythmia   . Asthma   . Colon polyp   . Coronary artery disease    a. Nonobstructive by 2004 cath b. Nonobstructive by 2015 cath in setting of NSTEMI  . Diabetes mellitus without complication (HCC)    a. Hgb A1C 5.9% 05/2013  . GERD (gastroesophageal reflux disease)   . Heart murmur   . Hiatal hernia   . Hyperlipidemia   . Hypertension   . Moderate aortic stenosis    a. echo 2013: EF 55%, nl wall motion, mild DD, mild LVH, trace MR, moderate AS with peak gradient of 55 mm Hg; b. TEE 05/2013: EF EF  60-65%, borderline LVH, mild MR/AI, mod AS with valve area 1.0-1.26; c. echo 10/2014: 55-60%, no RWMA, GR1DD, mod AS w/ mean gradient of 28 mm Hg     . Obesity   . Onychomycosis   . Osteoarthritis     Past Surgical History:  Procedure Laterality Date  . CARDIAC CATHETERIZATION  11/2002  . CARDIAC CATHETERIZATION  05/21/2013   showing 30% oLM, 30% D1, 20% mLCx, 30% pRCA; EF 60%, severe AS (mean gradient 28 mmHg, peak 26, area 0.9)  . CHOLECYSTECTOMY  2001  . TRANSESOPHAGEAL ECHOCARDIOGRAM  05/22/2013   Preserved EF, moderate AS (valve area by planimetry between 1.0-1.26 cm sq), moderate aortic arch and descending aortic atherosclerosis.   . TRANSTHORACIC ECHOCARDIOGRAM  05/19/2013   EF 60-65%, impaired diastolic function, mild LA dilatation, mild MR/AI/TR, mod AS, high normal RVSP (30.4 mmHg)     Home Medications:  Prior to Admission medications   Medication Sig Start Date End Date Taking? Authorizing Provider  atorvastatin (LIPITOR) 80 MG tablet Take 1 tablet (80 mg total) by mouth daily. 06/01/17  Yes Glori LuisSonnenberg, Jeffery G, MD  clopidogrel (PLAVIX) 75 MG tablet Take 1 tablet (75 mg total) by mouth daily. 06/01/17  Yes Glori LuisSonnenberg, Jeffery G, MD  ferrous sulfate 325 (65 FE) MG EC tablet Take 1 tablet (325 mg total) by mouth 2 (two) times daily. 06/06/17  Yes Glori LuisSonnenberg, Jeffery G, MD  furosemide (LASIX) 20 MG tablet Take 1 tablet (20 mg total) by mouth daily. 06/01/17 06/01/18 Yes Glori Luis, MD  lisinopril (PRINIVIL,ZESTRIL) 10 MG tablet Take 1 tablet (10 mg total) by mouth daily. 06/01/17  Yes Glori Luis, MD  metoprolol succinate (TOPROL-XL) 25 MG 24 hr tablet Take 1 tablet (25 mg total) by mouth daily. 06/01/17  Yes Glori Luis, MD  potassium chloride (K-DUR) 10 MEQ tablet Take 1 tablet (10 mEq total) by mouth daily. 06/01/17  Yes Glori Luis, MD    Inpatient Medications: Scheduled Meds: . atorvastatin  80 mg Oral Daily  . clopidogrel  75 mg Oral Daily  . enoxaparin (LOVENOX)  injection  40 mg Subcutaneous Q24H  . furosemide  40 mg Intravenous TID  . insulin aspart  0-5 Units Subcutaneous QHS  . insulin aspart  0-9 Units Subcutaneous TID WC  . lisinopril  10 mg Oral Daily  . metoprolol succinate  25 mg Oral Daily   Continuous Infusions: . ampicillin-sulbactam (UNASYN) IV    . vancomycin     PRN Meds: acetaminophen **OR** acetaminophen, ondansetron **OR** ondansetron (ZOFRAN) IV, oxyCODONE  Allergies:    Allergies  Allergen Reactions  . Aspirin Rash and Other (See Comments)    Reaction: Trouble breathing     Social History:   Social History   Socioeconomic History  . Marital status: Married    Spouse name: Not on file  . Number of children: Not on file  . Years of education: Not on file  . Highest education level: Not on file  Occupational History  . Occupation: Retired Agricultural engineer     Comment: Acupuncturist  Social Needs  . Financial resource strain: Not hard at all  . Food insecurity:    Worry: Never true    Inability: Never true  . Transportation needs:    Medical: No    Non-medical: No  Tobacco Use  . Smoking status: Former Smoker    Packs/Hoaglund: 1.50    Years: 20.00    Pack years: 30.00    Types: Cigarettes    Last attempt to quit: 02/22/1981    Years since quitting: 36.4  . Smokeless tobacco: Never Used  Substance and Sexual Activity  . Alcohol use: No  . Drug use: No  . Sexual activity: Not on file  Lifestyle  . Physical activity:    Days per week: 0 days    Minutes per session: 0 min  . Stress: Only a little  Relationships  . Social connections:    Talks on phone: Patient refused    Gets together: Patient refused    Attends religious service: Patient refused    Active member of club or organization: Patient refused    Attends meetings of clubs or organizations: Patient refused    Relationship status: Patient refused  . Intimate partner violence:    Fear of current or ex partner: Patient refused      Emotionally abused: Patient refused    Physically abused: Patient refused    Forced sexual activity: Patient refused  Other Topics Concern  . Not on file  Social History Narrative   [name] lives in [place] with his wife. They have a son and daughter. He is retired from [employer]. He enjoys fishing.    Family History:    Family History  Problem Relation Age of Onset  . Heart disease Mother   . Heart disease Father 32  . Stroke Brother  ROS:  Please see the history of present illness.   All other ROS reviewed and negative.     Physical Exam/Data:   Vitals:   08/07/17 0000 08/07/17 0033 08/07/17 0431 08/07/17 0443  BP: (!) 115/59 (!) 133/58 (!) 137/52   Pulse: 72 76 73   Resp:  18    Temp:   99.3 F (37.4 C)   TempSrc:   Oral   SpO2: 97% 99% 99%   Weight:    269 lb 6.4 oz (122.2 kg)  Height:    5\' 11"  (1.803 m)    Intake/Output Summary (Last 24 hours) at 08/07/2017 1150 Last data filed at 08/07/2017 0857 Gross per 24 hour  Intake 540 ml  Output 300 ml  Net 240 ml   Filed Weights   08/06/17 2105 08/07/17 0443  Weight: 220 lb (99.8 kg) 269 lb 6.4 oz (122.2 kg)   Body mass index is 37.57 kg/m.  General:  Well nourished, well developed, in no acute distress HEENT: normal Lymph: no adenopathy Neck: no JVD Endocrine:  No thryomegaly Vascular: No carotid bruits; FA pulses 2+ bilaterally without bruits  Cardiac:  normal S1, S2; RRR; 2 out of 6 systolic murmur at the base Lungs:  clear to auscultation bilaterally, no wheezing, rhonchi or rales  Abd: soft, nontender, no hepatomegaly  Ext: Significant nonpitting lower extremity edema right greater than left Musculoskeletal:  No deformities, BUE and BLE strength normal and equal Skin: warm and dry  Neuro:  CNs 2-12 intact, no focal abnormalities noted Psych:  Normal affect   EKG:  The EKG was personally reviewed and demonstrates: EKG pending   Relevant CV Studies: TTE 10/2016 - Left ventricle: The cavity  size was normal. Wall thickness was   increased increased in a pattern of mild to moderate LVH.   Systolic function was vigorous. The estimated ejection fraction   was in the range of 65% to 70%. Doppler parameters are consistent   with abnormal left ventricular relaxation (grade 1 diastolic   dysfunction). - Aortic valve: Right coronary and noncoronary cusp mobility was   severely restricted. Transvalvular velocity was increased. There   was moderate to severe stenosis. There was moderate   regurgitation, though evaluation was limited by lack of septral   Doppler. Mean gradient (S): 36 mm Hg. Valve area (VTI): 1.1 cm^2.   Valve area (Vmax): 0.94 cm^2. - Left atrium: The atrium was mildly dilated. - Right ventricle: The cavity size was normal. Wall thickness was   normal. Systolic function was normal. - Right atrium: The atrium was mildly dilated. - Pulmonary arteries: Systolic pressure wasprobably upper normal to   mildly elevated, with a peak pressure of 25-30 mmHg plus central   venous pressure.  Laboratory Data:  Chemistry Recent Labs  Lab 08/05/17 1412 08/06/17 2156  NA 139 138  K 3.9 3.6  CL 105 102  CO2 27 27  GLUCOSE 109* 112*  BUN 13 20  CREATININE 0.82 0.99  CALCIUM 8.8* 9.0  GFRNONAA >60 >60  GFRAA >60 >60  ANIONGAP 7 9    Recent Labs  Lab 08/05/17 1412  PROT 7.1  ALBUMIN 3.5  AST 24  ALT 13*  ALKPHOS 77  BILITOT 2.1*   Hematology Recent Labs  Lab 08/05/17 1412 08/06/17 2156  WBC 6.1 6.7  RBC 4.90 4.82  HGB 13.0 12.7*  HCT 39.2* 38.5*  MCV 80.0 79.8*  MCH 26.4 26.4  MCHC 33.1 33.1  RDW 16.2* 16.4*  PLT 160 159  Cardiac EnzymesNo results for input(s): TROPONINI in the last 168 hours. No results for input(s): TROPIPOC in the last 168 hours.  BNP Recent Labs  Lab 08/06/17 2156  BNP 163.0*    DDimer No results for input(s): DDIMER in the last 168 hours.  Radiology/Studies:  Ct Head Wo Contrast  Result Date: 08/05/2017 CLINICAL DATA:   Headache, primarily frontal EXAM: CT HEAD WITHOUT CONTRAST TECHNIQUE: Contiguous axial images were obtained from the base of the skull through the vertex without intravenous contrast. COMPARISON:  May 26, 2014 FINDINGS: Brain: There is age related volume loss. There is no intracranial mass, hemorrhage, extra-axial fluid collection, or midline shift. There is slight small vessel disease in the centra semiovale bilaterally. Elsewhere gray-white compartments appear normal. Vascular: No evident hyperdense vessel. There is calcification in each carotid siphon region. Skull: Bony calvarium appears intact. Sinuses/Orbits: There is mucosal thickening in both sphenoid sinuses, more on the left than on the right. There is opacification in several ethmoid air cells bilaterally. The patient has had surgery in each ethmoid air cell region. Visualized orbits appear symmetric bilaterally. Other: Visualized mastoid air cells are clear. IMPRESSION: Age related volume loss with slight periventricular small vessel disease. No acute infarct evident. No mass or hemorrhage. Foci of arterial vascular calcification noted. Postoperative change noted in the ethmoid air cells. There are areas of paranasal sinus disease, most notably in the left sphenoid sinus posteriorly. There are foci of arterial vascular calcification evident. Electronically Signed   By: Bretta Bang III M.D.   On: 08/05/2017 15:08   US Venous Img Lower Bilateral  Result Date: 08/06/2017 CLINICAL DATA:  Redness in the lower extremities.  Evaluate for DVT. EXAM: BILATERAL LOWER EXTREMITY VENOUS DOPPLER ULTRASOUND TECHNIQUE: Gray-scale sonography with graded compression, as well as color Doppler and duplex ultrasound were performed to evaluate the lower extremity deep venous systems from the level of the common femoral vein and including the common femoral, femoral, profunda femoral, popliteal and calf veins including the posterior tibial, peroneal and  gastrocnemius veins when visible. The superficial great saphenous vein was also interrogated. Spectral Doppler was utilized to evaluate flow at rest and with distal augmentation maneuvers in the common femoral, femoral and popliteal veins. COMPARISON:  No comparison studies available. FINDINGS: RIGHT LOWER EXTREMITY Common Femoral Vein: No evidence of thrombus. Normal compressibility, respiratory phasicity and response to augmentation. Saphenofemoral Junction: No evidence of thrombus. Normal compressibility and flow on color Doppler imaging. Profunda Femoral Vein: No evidence of thrombus. Normal compressibility and flow on color Doppler imaging. Femoral Vein: No evidence of thrombus. Normal compressibility, respiratory phasicity and response to augmentation. Popliteal Vein: No evidence of thrombus. Normal compressibility, respiratory phasicity and response to augmentation. Calf Veins: No evidence of thrombus. Normal compressibility and flow on color Doppler imaging. Other Findings:  None. LEFT LOWER EXTREMITY Common Femoral Vein: No evidence of thrombus. Normal compressibility, respiratory phasicity and response to augmentation. Saphenofemoral Junction: No evidence of thrombus. Normal compressibility and flow on color Doppler imaging. Profunda Femoral Vein: No evidence of thrombus. Normal compressibility and flow on color Doppler imaging. Femoral Vein: No evidence of thrombus. Normal compressibility, respiratory phasicity and response to augmentation. Popliteal Vein: No evidence of thrombus. Normal compressibility, respiratory phasicity and response to augmentation. Calf Veins: No evidence of thrombus. Normal compressibility and flow on color Doppler imaging. Other Findings:  None. IMPRESSION: No evidence of deep venous thrombosis. Electronically Signed   By: Kennith Center M.D.   On: 08/06/2017 23:52    Assessment and  Plan:   1. Chronic diastolic heart failure: Volume status appears to be stable.  He had BMP  that was measured at 168.  It is unlikely that he is having a current heart failure exacerbation. 2. Moderate to severe aortic stenosis: Moderate to severe on most recent echo.  Not complaining of shortness of breath, chest pain, or syncope.  No further inpatient evaluation necessary. 3. Cellulitis: Currently on vancomycin and Unasyn per primary team 4. Hypertension: Continue home medications 5. Chronic venous insufficiency With severe lower extremity edema: He has been evaluated by vascular surgery for lymphedema in the past.  He was recommended to wear graduated compression stockings.  It is unclear whether or not he has been compliant with this.  He does have a BNP that is only mildly elevated and this does not explain his lower extremity edema.  This is all likely due to his chronic lymphedema.  At this point, I would not say that this is due to a heart failure exacerbation.  Would recommend wound care consultation for further management of lymphedema.   For questions or updates, please contact [facility] Please consult [email or web address] for contact info under Cardiology/STEMI.   Signed, Kielee Care Jorja Loa, MD  08/07/2017 11:50 AM

## 2017-08-07 NOTE — Progress Notes (Signed)
PT Cancellation Note  Patient Details Name: Jeffery ArgyleMonroe Wandersee MRN: [id number] DOB: [birth year]   Cancelled Treatment:    Reason Eval/Treat Not Completed: Other (comment).   Eating a meal and then has echocardiogram.  Will try later as time and pt allow.   Ivar DrapeRuth E Tramaine Snell 08/07/2017, 2:30 PM   Samul Dadauth Jackilyn Umphlett, PT MS Acute Rehab Dept. Number: [facility] [phone] and [facility] [phone]

## 2017-08-07 NOTE — Progress Notes (Signed)
Advanced directive education requested. AD Education [facility] to [name] and his step-daughter (primary care-giver). Prayer offered.     08/07/17 1300  Clinical Encounter Type  Visited With Patient and family together  Visit Type Initial  Referral From Nurse

## 2017-08-07 NOTE — Progress Notes (Signed)
Family Meeting Note  Advance Directive:no  Today a meeting took place with the Patient.and daughter The following clinical team members were present during this meeting:MD  The following were discussed:Patient's diagnosis:  Acute on chronic diastolic heart failure Lower extremity edema Cellulitis bilateral lower extremities Diabetes  , Patient's progosis: > 12 months and Goals for treatment: Full Code  Additional follow-up to be provided: Consultation to [employer] Directives and Power Of Attorney Which Will Be Patient's Daughter  Time spent during discussion: 16 Minutes  Dannel Rafter, Patricia PesaSITAL, MD

## 2017-08-07 NOTE — ED Notes (Signed)
1A nurse Threasa BeardsAnnessa informed about delay of antibiotics due to this RN being in CODE and pt needing decontamination due to bed bugs.

## 2017-08-07 NOTE — Progress Notes (Signed)
Advanced directive education requested. AD education [facility] to [facility] and step-daughter (primary caregiver) who is responsible for several family members and lives two hours from hospital. Prayer offered.    08/07/17 1300  Clinical Encounter Type  Visited With Patient and family together  Visit Type Initial  Referral From Nurse

## 2017-08-07 NOTE — Progress Notes (Addendum)
Pharmacy Antibiotic Note  [name] is a 84 y.o. male admitted on 08/06/2017 with cellulitis.  Pharmacy has been consulted for vancomycin and meropenem dosing.  Plan: DW 85kg  Vd 60L kei 0.06 hr-1  T1/2 12 hours Vancomycin 1 gram q 12 hours ordered with stacked dosing. Level before 5th dose. Goal trough 15-20.  Meropenem 1 gram q 8 hours ordered.  Height: 5\' 11"  (180.3 cm) Weight: 269 lb 6.4 oz (122.2 kg) IBW/kg (Calculated) : 75.3  Temp (24hrs), Avg:98.9 F (37.2 C), Min:98.4 F (36.9 C), Max:99.3 F (37.4 C)  Recent Labs  Lab 08/05/17 1412 08/06/17 2156  WBC 6.1 6.7  CREATININE 0.82 0.99    Estimated Creatinine Clearance: 73.9 mL/min (by C-G formula based on SCr of 0.99 mg/dL).    Allergies  Allergen Reactions  . Aspirin Rash and Other (See Comments)    Reaction: Trouble breathing     Antimicrobials this admission: Vancomycin, meropenem 3/24 >>    >>   Dose adjustments this admission:   Microbiology results: 3/24 BCx: pending  Thank you for allowing pharmacy to be a part of this patient's care.  [name] S 08/07/2017 5:12 AM

## 2017-08-07 NOTE — Progress Notes (Signed)
Patient seen and evaluated.  Daughter is at bedside.  Patient with lower extremity edema  Last echocardiogram showed preserved ejection fraction with stage I diastolic dysfunction  Patient has acute on chronic diastolic heart failure  Start Lasix 40 IV 3 times daily Cardiology consultation Strict I's and O's daily weight  Continue vancomycin and change meropenem to Unasyn for bilateral lower extremity cellulitis Doppler was negative for DVT Patient has been evaluated by vascular in the past for PAD according to the daughter PVD  Wound care and PT consult requested

## 2017-08-07 NOTE — Care Management Obs Status (Signed)
MEDICARE OBSERVATION STATUS NOTIFICATION   Patient Details  Name: [name] MRN: [id number] Date of Birth: 02/23/1931   Medicare Observation Status Notification Given:  Yes    Gwenette GreetBrenda S Evelyn Moch, RN 08/07/2017, 8:30 AM

## 2017-08-07 NOTE — H&P (Signed)
[facility] - [place] at [facility]   PATIENT NAME: [name]    MR#:  [id number]  DATE OF BIRTH:  11/14/1930  DATE OF ADMISSION:  08/06/2017  PRIMARY CARE PHYSICIAN: Glori Luis, MD   REQUESTING/REFERRING PHYSICIAN: Fanny Bien, MD  CHIEF COMPLAINT:   Chief Complaint  Patient presents with  . Leg Pain  . Weakness    HISTORY OF PRESENT ILLNESS:  [name]  is a 84 y.o. male who presents with leg pain, weakness.  Patient was brought to the ED by EMS.  EMS states that his house was in significant disarray.  Patient was found to have bedbugs here in the ED.  He has bilateral lower extremity cellulitis.  Hospitalist were called for admission  PAST MEDICAL HISTORY:   Past Medical History:  Diagnosis Date  . Aortic atherosclerosis (HCC) 05/2013   Per TEE  . Arrhythmia   . Asthma   . Colon polyp   . Coronary artery disease    a. Nonobstructive by 2004 cath b. Nonobstructive by 2015 cath in setting of NSTEMI  . Diabetes mellitus without complication (HCC)    a. Hgb A1C 5.9% 05/2013  . GERD (gastroesophageal reflux disease)   . Heart murmur   . Hiatal hernia   . Hyperlipidemia   . Hypertension   . Moderate aortic stenosis    a. echo 2013: EF 55%, nl wall motion, mild DD, mild LVH, trace MR, moderate AS with peak gradient of 55 mm Hg; b. TEE 05/2013: EF EF 60-65%, borderline LVH, mild MR/AI, mod AS with valve area 1.0-1.26; c. echo 10/2014: 55-60%, no RWMA, GR1DD, mod AS w/ mean gradient of 28 mm Hg     . Obesity   . Onychomycosis   . Osteoarthritis      PAST SURGICAL HISTORY:   Past Surgical History:  Procedure Laterality Date  . CARDIAC CATHETERIZATION  11/2002  . CARDIAC CATHETERIZATION  05/21/2013   showing 30% oLM, 30% D1, 20% mLCx, 30% pRCA; EF 60%, severe AS (mean gradient 28 mmHg, peak 26, area 0.9)  . CHOLECYSTECTOMY  2001  . TRANSESOPHAGEAL ECHOCARDIOGRAM  05/22/2013   Preserved EF, moderate AS (valve area by planimetry between 1.0-1.26 cm  sq), moderate aortic arch and descending aortic atherosclerosis.   . TRANSTHORACIC ECHOCARDIOGRAM  05/19/2013   EF 60-65%, impaired diastolic function, mild LA dilatation, mild MR/AI/TR, mod AS, high normal RVSP (30.4 mmHg)     SOCIAL HISTORY:   Social History   Tobacco Use  . Smoking status: Former Smoker    Packs/Philbert: 1.50    Years: 20.00    Pack years: 30.00    Types: Cigarettes    Last attempt to quit: 02/22/1981    Years since quitting: 36.4  . Smokeless tobacco: Never Used  Substance Use Topics  . Alcohol use: No     FAMILY HISTORY:   Family History  Problem Relation Age of Onset  . Heart disease Mother   . Heart disease Father 38  . Stroke Brother      DRUG ALLERGIES:   Allergies  Allergen Reactions  . Aspirin Rash and Other (See Comments)    Reaction: Trouble breathing     MEDICATIONS AT HOME:   Prior to Admission medications   Medication Sig Start Date End Date Taking? Authorizing Provider  atorvastatin (LIPITOR) 80 MG tablet Take 1 tablet (80 mg total) by mouth daily. 06/01/17  Yes Glori Luis, MD  clopidogrel (PLAVIX) 75 MG tablet Take 1 tablet (  75 mg total) by mouth daily. 06/01/17  Yes Glori Luis, MD  ferrous sulfate 325 (65 FE) MG EC tablet Take 1 tablet (325 mg total) by mouth 2 (two) times daily. 06/06/17  Yes Glori Luis, MD  furosemide (LASIX) 20 MG tablet Take 1 tablet (20 mg total) by mouth daily. 06/01/17 06/01/18 Yes Glori Luis, MD  lisinopril (PRINIVIL,ZESTRIL) 10 MG tablet Take 1 tablet (10 mg total) by mouth daily. 06/01/17  Yes Glori Luis, MD  metoprolol succinate (TOPROL-XL) 25 MG 24 hr tablet Take 1 tablet (25 mg total) by mouth daily. 06/01/17  Yes Glori Luis, MD  potassium chloride (K-DUR) 10 MEQ tablet Take 1 tablet (10 mEq total) by mouth daily. 06/01/17  Yes Glori Luis, MD    REVIEW OF SYSTEMS:  Review of Systems  Constitutional: Negative for chills, fever, malaise/fatigue and weight  loss.  HENT: Negative for ear pain, hearing loss and tinnitus.   Eyes: Negative for blurred vision, double vision, pain and redness.  Respiratory: Negative for cough, hemoptysis and shortness of breath.   Cardiovascular: Positive for leg swelling (Tenderness and erythema). Negative for chest pain, palpitations and orthopnea.  Gastrointestinal: Negative for abdominal pain, constipation, diarrhea, nausea and vomiting.  Genitourinary: Negative for dysuria, frequency and hematuria.  Musculoskeletal: Negative for back pain, joint pain and neck pain.  Skin:       No acne, rash, or lesions  Neurological: Negative for dizziness, tremors, focal weakness and weakness.  Endo/Heme/Allergies: Negative for polydipsia. Does not bruise/bleed easily.  Psychiatric/Behavioral: Negative for depression. The patient is not nervous/anxious and does not have insomnia.      VITAL SIGNS:   Vitals:   08/06/17 2208 08/06/17 2330 08/07/17 0000 08/07/17 0033  BP: (!) 166/64 (!) 150/55 (!) 115/59 (!) 133/58  Pulse: 83 79 72 76  Resp: 18   18  Temp:      TempSrc:      SpO2: 100% 98% 97% 99%  Weight:      Height:       Wt Readings from Last 3 Encounters:  08/06/17 99.8 kg (220 lb)  08/05/17 99.8 kg (220 lb)  07/07/17 125.6 kg (277 lb)    PHYSICAL EXAMINATION:  Physical Exam  Vitals reviewed. Constitutional: He is oriented to person, place, and time. He appears well-developed and well-nourished. No distress.  HENT:  Head: Normocephalic and atraumatic.  Mouth/Throat: Oropharynx is clear and moist.  Eyes: Pupils are equal, round, and reactive to light. Conjunctivae and EOM are normal. No scleral icterus.  Neck: Normal range of motion. Neck supple. No JVD present. No thyromegaly present.  Cardiovascular: Normal rate, regular rhythm and intact distal pulses. Exam reveals no gallop and no friction rub.  No murmur heard. Respiratory: Effort normal and breath sounds normal. No respiratory distress. He has no  wheezes. He has no rales.  GI: Soft. Bowel sounds are normal. He exhibits no distension. There is no tenderness.  Musculoskeletal: Normal range of motion. He exhibits edema.  No arthritis, no gout  Lymphadenopathy:    He has no cervical adenopathy.  Neurological: He is alert and oriented to person, place, and time. No cranial nerve deficit.  No dysarthria, no aphasia  Skin: Skin is warm and dry. No rash noted. There is erythema (Bilateral lower extremities with tenderness and warmth).  Psychiatric: He has a normal mood and affect. His behavior is normal. Judgment and thought content normal.    LABORATORY PANEL:   CBC Recent Labs  Lab 08/06/17 2156  WBC 6.7  HGB 12.7*  HCT 38.5*  PLT 159   ------------------------------------------------------------------------------------------------------------------  Chemistries  Recent Labs  Lab 08/05/17 1412 08/06/17 2156  NA 139 138  K 3.9 3.6  CL 105 102  CO2 27 27  GLUCOSE 109* 112*  BUN 13 20  CREATININE 0.82 0.99  CALCIUM 8.8* 9.0  AST 24  --   ALT 13*  --   ALKPHOS 77  --   BILITOT 2.1*  --    ------------------------------------------------------------------------------------------------------------------  Cardiac Enzymes No results for input(s): TROPONINI in the last 168 hours. ------------------------------------------------------------------------------------------------------------------  RADIOLOGY:  Ct Head Wo Contrast  Result Date: 08/05/2017 CLINICAL DATA:  Headache, primarily frontal EXAM: CT HEAD WITHOUT CONTRAST TECHNIQUE: Contiguous axial images were obtained from the base of the skull through the vertex without intravenous contrast. COMPARISON:  May 26, 2014 FINDINGS: Brain: There is age related volume loss. There is no intracranial mass, hemorrhage, extra-axial fluid collection, or midline shift. There is slight small vessel disease in the centra semiovale bilaterally. Elsewhere gray-white compartments  appear normal. Vascular: No evident hyperdense vessel. There is calcification in each carotid siphon region. Skull: Bony calvarium appears intact. Sinuses/Orbits: There is mucosal thickening in both sphenoid sinuses, more on the left than on the right. There is opacification in several ethmoid air cells bilaterally. The patient has had surgery in each ethmoid air cell region. Visualized orbits appear symmetric bilaterally. Other: Visualized mastoid air cells are clear. IMPRESSION: Age related volume loss with slight periventricular small vessel disease. No acute infarct evident. No mass or hemorrhage. Foci of arterial vascular calcification noted. Postoperative change noted in the ethmoid air cells. There are areas of paranasal sinus disease, most notably in the left sphenoid sinus posteriorly. There are foci of arterial vascular calcification evident. Electronically Signed   By: Bretta Bang III M.D.   On: 08/05/2017 15:08   US Venous Img Lower Bilateral  Result Date: 08/06/2017 CLINICAL DATA:  Redness in the lower extremities.  Evaluate for DVT. EXAM: BILATERAL LOWER EXTREMITY VENOUS DOPPLER ULTRASOUND TECHNIQUE: Gray-scale sonography with graded compression, as well as color Doppler and duplex ultrasound were performed to evaluate the lower extremity deep venous systems from the level of the common femoral vein and including the common femoral, femoral, profunda femoral, popliteal and calf veins including the posterior tibial, peroneal and gastrocnemius veins when visible. The superficial great saphenous vein was also interrogated. Spectral Doppler was utilized to evaluate flow at rest and with distal augmentation maneuvers in the common femoral, femoral and popliteal veins. COMPARISON:  No comparison studies available. FINDINGS: RIGHT LOWER EXTREMITY Common Femoral Vein: No evidence of thrombus. Normal compressibility, respiratory phasicity and response to augmentation. Saphenofemoral Junction: No  evidence of thrombus. Normal compressibility and flow on color Doppler imaging. Profunda Femoral Vein: No evidence of thrombus. Normal compressibility and flow on color Doppler imaging. Femoral Vein: No evidence of thrombus. Normal compressibility, respiratory phasicity and response to augmentation. Popliteal Vein: No evidence of thrombus. Normal compressibility, respiratory phasicity and response to augmentation. Calf Veins: No evidence of thrombus. Normal compressibility and flow on color Doppler imaging. Other Findings:  None. LEFT LOWER EXTREMITY Common Femoral Vein: No evidence of thrombus. Normal compressibility, respiratory phasicity and response to augmentation. Saphenofemoral Junction: No evidence of thrombus. Normal compressibility and flow on color Doppler imaging. Profunda Femoral Vein: No evidence of thrombus. Normal compressibility and flow on color Doppler imaging. Femoral Vein: No evidence of thrombus. Normal compressibility, respiratory phasicity and response to augmentation. Popliteal Vein:  No evidence of thrombus. Normal compressibility, respiratory phasicity and response to augmentation. Calf Veins: No evidence of thrombus. Normal compressibility and flow on color Doppler imaging. Other Findings:  None. IMPRESSION: No evidence of deep venous thrombosis. Electronically Signed   By: Kennith CenterEric  Mansell M.D.   On: 08/06/2017 23:52    EKG:   Orders placed or performed during the hospital encounter of 07/16/16  . EKG 12-Lead  . EKG 12-Lead  . ED EKG within 10 minutes  . ED EKG within 10 minutes  . EKG    IMPRESSION AND PLAN:  Principal Problem:   Cellulitis -IV antibiotics, we will start with vancomycin and also meropenem for Pseudomonas coverage Active Problems:   Diabetes mellitus (HCC) -sliding scale insulin with corresponding glucose checks   Coronary artery disease, non-occlusive -continue home meds   Hypertension -continue home medications   Chronic venous insufficiency -home dose  Lasix, patient might benefit from compression wrappings once his cellulitis begins to heal   Hyperlipidemia -home dose anti lipid  Chart review performed and case discussed with ED provider. Labs, imaging and/or ECG reviewed by provider and discussed with patient/family. Management plans discussed with the patient and/or family.  DVT PROPHYLAXIS: SubQ lovenox  GI PROPHYLAXIS: None  ADMISSION STATUS: Observation  CODE STATUS: Full Code Status History    Date Active Date Inactive Code Status Order ID Comments User Context   05/13/2016 0435 05/13/2016 1813 Full Code [id number]  Arnaldo Nataliamond, Michael S, MD Inpatient   06/21/2015 0533 06/21/2015 1553 Full Code [id number]  Ihor AustinPyreddy, Pavan, MD ED      TOTAL TIME TAKING CARE OF THIS PATIENT: 40 minutes.   Tawfiq Favila FIELDING 08/07/2017, 12:43 AM  [facility]  Office  [phone]  CC: Primary care physician; Glori LuisSonnenberg, Eric G, MD  Note:  This document was prepared using Dragon voice recognition software and may include unintentional dictation errors.

## 2017-08-08 ENCOUNTER — Encounter: Payer: Self-pay | Admitting: Student

## 2017-08-08 DIAGNOSIS — I5032 Chronic diastolic (congestive) heart failure: Secondary | ICD-10-CM

## 2017-08-08 DIAGNOSIS — L03116 Cellulitis of left lower limb: Secondary | ICD-10-CM

## 2017-08-08 DIAGNOSIS — L03115 Cellulitis of right lower limb: Principal | ICD-10-CM

## 2017-08-08 LAB — GLUCOSE, CAPILLARY
GLUCOSE-CAPILLARY: 97 mg/dL (ref 65–99)
Glucose-Capillary: 93 mg/dL (ref 65–99)
Glucose-Capillary: 97 mg/dL (ref 65–99)
Glucose-Capillary: 102 mg/dL — ABNORMAL HIGH (ref 65–99)

## 2017-08-08 LAB — BASIC METABOLIC PANEL WITH GFR
Anion gap: 6 (ref 5–15)
BUN: 18 mg/dL (ref 6–20)
CO2: 30 mmol/L (ref 22–32)
Calcium: 8 mg/dL — ABNORMAL LOW (ref 8.9–10.3)
Chloride: 102 mmol/L (ref 101–111)
Creatinine, Ser: 1.01 mg/dL (ref 0.61–1.24)
GFR calc Af Amer: >60 mL/min
GFR calc non Af Amer: >60 mL/min
Glucose, Bld: 109 mg/dL — ABNORMAL HIGH (ref 65–99)
Potassium: 3 mmol/L — ABNORMAL LOW (ref 3.5–5.1)
Sodium: 138 mmol/L (ref 135–145)

## 2017-08-08 MED ORDER — FUROSEMIDE 40 MG PO TABS
40.0000 mg | ORAL_TABLET | Freq: Two times a day (BID) | ORAL | Status: DC
  Administered 2017-08-08 – 2017-08-10 (×4): 40 mg via ORAL
  Filled 2017-08-08 (×4): qty 1

## 2017-08-08 MED ORDER — POTASSIUM CHLORIDE CRYS ER 20 MEQ PO TBCR
20.0000 meq | EXTENDED_RELEASE_TABLET | Freq: Every day | ORAL | Status: DC
Start: 2017-08-09 — End: 2017-08-10
  Administered 2017-08-09 – 2017-08-10 (×2): 20 meq via ORAL
  Filled 2017-08-08 (×2): qty 1

## 2017-08-08 MED ORDER — FUROSEMIDE 20 MG PO TABS: 20.0000 mg | ORAL_TABLET | Freq: Two times a day (BID) | ORAL | Status: DC

## 2017-08-08 MED ORDER — HYDROCERIN EX CREA
TOPICAL_CREAM | Freq: Two times a day (BID) | CUTANEOUS | Status: DC
  Administered 2017-08-08 – 2017-08-09 (×3): via TOPICAL
  Filled 2017-08-08: qty 113

## 2017-08-08 MED ORDER — ALUM & MAG HYDROXIDE-SIMETH 200-200-20 MG/5ML PO SUSP
30.0000 mL | Freq: Four times a day (QID) | ORAL | Status: DC | PRN
  Filled 2017-08-08: qty 30

## 2017-08-08 MED ORDER — POTASSIUM CHLORIDE CRYS ER 20 MEQ PO TBCR
40.0000 meq | EXTENDED_RELEASE_TABLET | ORAL | Status: AC
  Administered 2017-08-08 (×2): 40 meq via ORAL
  Filled 2017-08-08 (×2): qty 2

## 2017-08-08 NOTE — [place] FL2 (Signed)
[place] MEDICAID FL2 LEVEL OF CARE SCREENING TOOL     IDENTIFICATION  Patient Name: [name] Birthdate: [birth year] Sex: male Admission Date (Current Location): 08/06/2017  [place]   Facility and Address:  [facility], [address], [place], [place] [address]      Provider Number: [id number]  Attending Physician Name and Address:  Auburn BilberryPatel, Shreyang, MD  Relative Name and Phone Number:       Current Level of Care: Hospital Recommended Level of Care: Skilled Nursing Facility Prior Approval Number:    Date Approved/Denied:   PASRR Number: ([id number])  Discharge Plan: SNF    Current Diagnoses: Patient Active Problem List   Diagnosis Date Noted  . Chronic diastolic heart failure (HCC)   . Bilateral lower leg cellulitis 08/06/2017  . Venous ulcer (HCC) 07/08/2017  . Osteoarthritis 05/28/2017  . Varicose veins of both lower extremities with pain 04/10/2017  . Iron deficiency anemia 11/25/2016  . Chronic venous insufficiency 10/04/2016  . Lymphedema 06/08/2016  . Morbid obesity (HCC) 03/26/2015  . Coronary artery disease, non-occlusive 06/05/2013  . Hypertension 06/05/2013  . Hyperlipidemia 02/23/2012  . Diabetes mellitus (HCC) 02/23/2012  . Moderate aortic stenosis 02/23/2012    Orientation RESPIRATION BLADDER Height & Weight     Self, Time, Situation, Place  Normal Continent Weight: 269 lb 6.4 oz (122.2 kg) Height:  5\' 11"  (180.3 cm)  BEHAVIORAL SYMPTOMS/MOOD NEUROLOGICAL BOWEL NUTRITION STATUS      Continent Diet(Diet: Heart Healthy )  AMBULATORY STATUS COMMUNICATION OF NEEDS Skin   Extensive Assist Verbally Normal                       Personal Care Assistance Level of Assistance  Bathing, Feeding, Dressing Bathing Assistance: Limited assistance Feeding assistance: Independent Dressing Assistance: Limited assistance     Functional Limitations Info  Sight, Hearing, Speech Sight Info:  Adequate Hearing Info: Impaired Speech Info: Adequate    SPECIAL CARE FACTORS FREQUENCY  PT (By licensed PT), OT (By licensed OT)     PT Frequency: (5) OT Frequency: (5)            Contractures      Additional Factors Info  Code Status, Allergies, Isolation Precautions Code Status Info: (Full Code. ) Allergies Info: (Aspirin)     Isolation Precautions Info: (isolation for bed bugs. )     Current Medications (08/08/2017):  This is the current hospital active medication list Current Facility-Administered Medications  Medication Dose Route Frequency Provider Last Rate Last Dose  . acetaminophen (TYLENOL) tablet 650 mg  650 mg Oral Q6H PRN Oralia ManisWillis, David, MD       Or  . acetaminophen (TYLENOL) suppository 650 mg  650 mg Rectal Q6H PRN Oralia ManisWillis, David, MD      . alum & mag hydroxide-simeth (MAALOX/MYLANTA) 200-200-20 MG/5ML suspension 30 mL  30 mL Oral Q6H PRN Auburn BilberryPatel, Shreyang, MD      . Ampicillin-Sulbactam (UNASYN) 3 g in sodium chloride 0.9 % 100 mL IVPB  3 g Intravenous Q6H Adrian SaranMody, Sital, MD   Stopped at 08/08/17 1201  . atorvastatin (LIPITOR) tablet 80 mg  80 mg Oral Daily Oralia ManisWillis, David, MD   80 mg at 08/08/17 0941  . clopidogrel (PLAVIX) tablet 75 mg  75 mg Oral Daily Oralia ManisWillis, David, MD   75 mg at 08/08/17 0941  . enoxaparin (LOVENOX) injection 40 mg  40 mg Subcutaneous Q24H Oralia ManisWillis, David, MD   40 mg at 08/07/17  2150  . furosemide (LASIX) tablet 40 mg  40 mg Oral BID Auburn Bilberry, MD      . hydrocerin (EUCERIN) cream   Topical BID Auburn Bilberry, MD      . insulin aspart (novoLOG) injection 0-5 Units  0-5 Units Subcutaneous QHS Oralia Manis, MD      . insulin aspart (novoLOG) injection 0-9 Units  0-9 Units Subcutaneous TID WC Oralia Manis, MD   1 Units at 08/07/17 [phone]  . lisinopril (PRINIVIL,ZESTRIL) tablet 10 mg  10 mg Oral Daily Oralia Manis, MD   10 mg at 08/08/17 1235  . metoprolol succinate (TOPROL-XL) 24 hr tablet 25 mg  25 mg Oral Daily Oralia Manis, MD   25 mg at  08/08/17 0940  . ondansetron (ZOFRAN) tablet 4 mg  4 mg Oral Q6H PRN Oralia Manis, MD       Or  . ondansetron [facility]) injection 4 mg  4 mg Intravenous Q6H PRN Oralia Manis, MD      . oxyCODONE (Oxy IR/ROXICODONE) immediate release tablet 5 mg  5 mg Oral Q4H PRN Oralia Manis, MD   5 mg at 08/08/17 0558  . [START ON 08/09/2017] potassium chloride SA (K-DUR,KLOR-CON) CR tablet 20 mEq  20 mEq Oral Daily Auburn Bilberry, MD         Discharge Medications: Please see discharge summary for a list of discharge medications.  Relevant Imaging Results:  Relevant Lab Results:   Additional Information (SSN: [id number])  Amun Stemm, Darleen Crocker, LCSW

## 2017-08-08 NOTE — Progress Notes (Signed)
Progress Note  Patient Name: [name] Date of Encounter: 08/08/2017  Primary Cardiologist: Julien Nordmann, MD   Subjective   Breathing at baseline. No chest pain or palpitations. Still with significant lower extremity edema but says this is improving.   Inpatient Medications    Scheduled Meds: . atorvastatin  80 mg Oral Daily  . clopidogrel  75 mg Oral Daily  . enoxaparin (LOVENOX) injection  40 mg Subcutaneous Q24H  . furosemide  40 mg Intravenous TID  . insulin aspart  0-5 Units Subcutaneous QHS  . insulin aspart  0-9 Units Subcutaneous TID WC  . lisinopril  10 mg Oral Daily  . metoprolol succinate  25 mg Oral Daily  . potassium chloride  40 mEq Oral Q4H   Continuous Infusions: . ampicillin-sulbactam (UNASYN) IV 3 g (08/08/17 0558)  . vancomycin Stopped (08/08/17 0159)   PRN Meds: acetaminophen **OR** acetaminophen, ondansetron **OR** ondansetron (ZOFRAN) IV, oxyCODONE   Vital Signs    Vitals:   08/07/17 0443 08/07/17 1530 08/07/17 2149 08/08/17 0844  BP:  (!) 106/40 (!) 109/38 (!) 131/55  Pulse:  77 72 85  Resp:  18 19 16   Temp:  98.7 F (37.1 C) 99.6 F (37.6 C) 98.9 F (37.2 C)  TempSrc:  Oral Oral Oral  SpO2:  100% 98% 97%  Weight: 269 lb 6.4 oz (122.2 kg)     Height: 5\' 11"  (1.803 m)       Intake/Output Summary (Last 24 hours) at 08/08/2017 1112 Last data filed at 08/08/2017 0439 Gross per 24 hour  Intake 400 ml  Output 1375 ml  Net -975 ml   Filed Weights   08/06/17 2105 08/07/17 0443  Weight: 220 lb (99.8 kg) 269 lb 6.4 oz (122.2 kg)    Telemetry    Not connected.   ECG    NSR, HR 83, with PVC's and known RBBB.  - Personally Reviewed  Physical Exam   General: Well developed, well nourished Caucasian male appearing in no acute distress. Head: Normocephalic, atraumatic.  Neck: Supple without bruits, JVD not elevated. Lungs:  Resp regular and unlabored, CTA without wheezing or rales. Heart: RRR, S1, S2, no S3, S4, no rub. 2/6 SEM  along RUSB.  Abdomen: Soft, non-tender, non-distended with normoactive bowel sounds. No hepatomegaly. No rebound/guarding. No obvious abdominal masses. Extremities: No clubbing or cyanosis, chronic edema with associated discoloration. Distal pedal pulses are 2+ bilaterally. Neuro: Alert and oriented X 3. Moves all extremities spontaneously. Psych: Normal affect.  Labs    Chemistry Recent Labs  Lab 08/05/17 1412 08/06/17 2156 08/08/17 0356  NA 139 138 138  K 3.9 3.6 3.0*  CL 105 102 102  CO2 27 27 30   GLUCOSE 109* 112* 109*  BUN 13 20 18   CREATININE 0.82 0.99 1.01  CALCIUM 8.8* 9.0 8.0*  PROT 7.1  --   --   ALBUMIN 3.5  --   --   AST 24  --   --   ALT 13*  --   --   ALKPHOS 77  --   --   BILITOT 2.1*  --   --   GFRNONAA >60 >60 >60  GFRAA >60 >60 >60  ANIONGAP 7 9 6      Hematology Recent Labs  Lab 08/05/17 1412 08/06/17 2156  WBC 6.1 6.7  RBC 4.90 4.82  HGB 13.0 12.7*  HCT 39.2* 38.5*  MCV 80.0 79.8*  MCH 26.4 26.4  MCHC 33.1 33.1  RDW 16.2* 16.4*  PLT 160  159    Cardiac EnzymesNo results for input(s): TROPONINI in the last 168 hours. No results for input(s): TROPIPOC in the last 168 hours.   BNP Recent Labs  Lab 08/06/17 2156  BNP 163.0*     DDimer No results for input(s): DDIMER in the last 168 hours.   Radiology    [place] Venous Img Lower Bilateral  Result Date: 08/06/2017 CLINICAL DATA:  Redness in the lower extremities.  Evaluate for DVT. EXAM: BILATERAL LOWER EXTREMITY VENOUS DOPPLER ULTRASOUND TECHNIQUE: Gray-scale sonography with graded compression, as well as color Doppler and duplex ultrasound were performed to evaluate the lower extremity deep venous systems from the level of the common femoral vein and including the common femoral, femoral, profunda femoral, popliteal and calf veins including the posterior tibial, peroneal and gastrocnemius veins when visible. The superficial great saphenous vein was also interrogated. Spectral Doppler was  utilized to evaluate flow at rest and with distal augmentation maneuvers in the common femoral, femoral and popliteal veins. COMPARISON:  No comparison studies available. FINDINGS: RIGHT LOWER EXTREMITY Common Femoral Vein: No evidence of thrombus. Normal compressibility, respiratory phasicity and response to augmentation. Saphenofemoral Junction: No evidence of thrombus. Normal compressibility and flow on color Doppler imaging. Profunda Femoral Vein: No evidence of thrombus. Normal compressibility and flow on color Doppler imaging. Femoral Vein: No evidence of thrombus. Normal compressibility, respiratory phasicity and response to augmentation. Popliteal Vein: No evidence of thrombus. Normal compressibility, respiratory phasicity and response to augmentation. Calf Veins: No evidence of thrombus. Normal compressibility and flow on color Doppler imaging. Other Findings:  None. LEFT LOWER EXTREMITY Common Femoral Vein: No evidence of thrombus. Normal compressibility, respiratory phasicity and response to augmentation. Saphenofemoral Junction: No evidence of thrombus. Normal compressibility and flow on color Doppler imaging. Profunda Femoral Vein: No evidence of thrombus. Normal compressibility and flow on color Doppler imaging. Femoral Vein: No evidence of thrombus. Normal compressibility, respiratory phasicity and response to augmentation. Popliteal Vein: No evidence of thrombus. Normal compressibility, respiratory phasicity and response to augmentation. Calf Veins: No evidence of thrombus. Normal compressibility and flow on color Doppler imaging. Other Findings:  None. IMPRESSION: No evidence of deep venous thrombosis. Electronically Signed   By: Kennith CenterEric  Mansell M.D.   On: 08/06/2017 23:52    Cardiac Studies   Echocardiogram: 10/2016 Study Conclusions  - Procedure narrative: Transthoracic echocardiography. The study   was technically difficult. - Left ventricle: The cavity size was normal. Wall thickness  was   increased increased in a pattern of mild to moderate LVH.   Systolic function was vigorous. The estimated ejection fraction   was in the range of 65% to 70%. Doppler parameters are consistent   with abnormal left ventricular relaxation (grade 1 diastolic   dysfunction). - Aortic valve: Right coronary and noncoronary cusp mobility was   severely restricted. Transvalvular velocity was increased. There   was moderate to severe stenosis. There was moderate   regurgitation, though evaluation was limited by lack of septral   Doppler. Mean gradient (S): 36 mm Hg. Valve area (VTI): 1.1 cm^2.   Valve area (Vmax): 0.94 cm^2. - Left atrium: The atrium was mildly dilated. - Right ventricle: The cavity size was normal. Wall thickness was   normal. Systolic function was normal. - Right atrium: The atrium was mildly dilated. - Pulmonary arteries: Systolic pressure wasprobably upper normal to   mildly elevated, with a peak pressure of 25-30 mmHg plus central   venous pressure.  Patient Profile     84 y.o.  male w/ PMH of CAD (nonobstructive CAD by cath in 2015), moderate to severe AS, HTN, HLD, chronic diastolic CHF, and lymphedema who presented to [facility] on 3/23 for evaluation of leg pain and worsening lower extremity edema.   Assessment & Plan    1. Chronic Diastolic CHF - the patient has a known preserved EF of 65-70% by echo in 10/2016. Presented with worsening lower extremity edema but reports breathing has been at baseline. BNP at 163 on admission. CXR not obtained. His lungs are clear on examination.  - has been receiving IV Lasix 40mg  TID with minimal recorded output of -735 mL since admission. Weights have been inaccurate. Creatinine slowly trending upwards. Would recommend transitioning back to PO regimen within the next 24 hours as his presentation is not overall consistent with a CHF exacerbation. Wound care has been consulted.   2. CAD - nonobstructive CAD by cath in 2015. He denies  any recent anginal symptoms and EKG shows no acute ischemic changes.  - no plans for further ischemic eval at this time. Remains on statin and BB therapy.   3. Aortic Stenosis - moderate to severe by echo in 10/2016 with mean gradient of (S): 36 mm Hg. Valve area (VTI): 1.1 cm^2. - he denies any recent symptoms. Continue to follow as an outpatient.   4. Chronic Venous Stasis - has been followed by Vascular Surgery as an outpatient for lymphedema. Also being treated for cellulitis by admitting team. Cellulitis less likely if bilateral, afebrile, and normalized WBC count.  Lower extremity dopplers show no evidence of a DVT.    For questions or updates, please contact [facility] Please consult [email or web address] for contact info under Cardiology/STEMI.   Signed, Ellsworth Lennox , PA-C 11:12 AM 08/08/2017 Pager: [phone]

## 2017-08-08 NOTE — Progress Notes (Signed)
PT Cancellation Note  Patient Details Name: [name] MRN: [id number] DOB: [birth year]   Cancelled Treatment:    Reason Eval/Treat Not Completed: Medical issues which prohibited therapy: Pt's Ka currently 3.0 and trending down which is outside guidelines for participation with PT services.  Will attempt to see pt at a future date/time as medically appropriate.     Ovidio Hanger. Scott Lorena Clearman PT, DPT 08/08/17, 12:49 PM

## 2017-08-08 NOTE — Consult Note (Signed)
WOC Nurse wound consult note Reason for Consult: Patient with cellulitis and lymphedema; no open wounds at this time. Wrapping from toes to knee does not impact lymphedema; manual massage combined with lymphedema wraps are needed. Patient reports that he lives alone at home and that his wife lives at [facility]. He reports that he spends a lot of time (nearly all the time) in a chair. Small linear wound on plantar aspect of foot at midfoot with dried serum (scab) loosening noted. Measures 0.2cm x 2cm with no depth, no drainage. Wound type: N/A Pressure Injury POA: NA Measurement: N/A Wound bed:N/A Drainage (amount, consistency, odor) None Periwound: dry Dressing procedure/placement/frequency: LEs with edema (patient states that this is better than it has been recently). Trophic changes on RLE pretibial area consistent with chronic venous condition, also hemosiderin deposits. I weill implement a POC for washing and moisturizing of the bilateral LEs. Heels to be floated while in house. If you agree, referral can be made to outpatient wound care facility that provides manual massage and arranges for patient to secure lymphedema pumps as his insurance allows.  WOC nursing team will not follow, but will remain available to this patient, the nursing and medical teams.  Please re-consult if needed. Thanks, Ladona MowLaurie Dontell Mian, MSN, RN, GNP, Hans EdenCWOCN, CWON-AP, FAAN  Pager# [phone]

## 2017-08-08 NOTE — Progress Notes (Signed)
[employer] - [place] at [facility]                                                                                                                                                                                  Patient Demographics   [name], is a 83 y.o. male, DOB - 08/14/1930, ZOX:[id number]  Admit date - 08/06/2017   Admitting Physician Oralia Manis, MD  Outpatient Primary MD for the patient is Glori Luis, MD   LOS - 1  Subjective: Patient states the swelling in the leg improved redness improved   Review of Systems:   CONSTITUTIONAL: No documented fever. No fatigue, weakness. No weight gain, no weight loss.  EYES: No blurry or double vision.  ENT: No tinnitus. No postnasal drip. No redness of the oropharynx.  RESPIRATORY: No cough, no wheeze, no hemoptysis. No dyspnea.  CARDIOVASCULAR: No chest pain. No orthopnea. No palpitations. No syncope.  GASTROINTESTINAL: No nausea, no vomiting or diarrhea. No abdominal pain. No melena or hematochezia.  GENITOURINARY: No dysuria or hematuria.  ENDOCRINE: No polyuria or nocturia. No heat or cold intolerance.  HEMATOLOGY: No anemia. No bruising. No bleeding.  INTEGUMENTARY: Positive for extremity swelling and redness MUSCULOSKELETAL: No arthritis. No swelling. No gout.  NEUROLOGIC: No numbness, tingling, or ataxia. No seizure-type activity.  PSYCHIATRIC: No anxiety. No insomnia. No ADD.    Vitals:   Vitals:   08/07/17 0443 08/07/17 1530 08/07/17 2149 08/08/17 0844  BP:  (!) 106/40 (!) 109/38 (!) 131/55  Pulse:  77 72 85  Resp:  18 19 16   Temp:  98.7 F (37.1 C) 99.6 F (37.6 C) 98.9 F (37.2 C)  TempSrc:  Oral Oral Oral  SpO2:  100% 98% 97%  Weight: 269 lb 6.4 oz (122.2 kg)     Height: 5\' 11"  (1.803 m)       Wt Readings from Last 3 Encounters:  08/07/17 269 lb 6.4 oz (122.2 kg)  08/05/17 220 lb (99.8 kg)  07/07/17 277 lb (125.6 kg)     Intake/Output Summary (Last 24 hours) at 08/08/2017  1444 Last data filed at 08/08/2017 1145 Gross per 24 hour  Intake 1220 ml  Output 1375 ml  Net -155 ml    Physical Exam:   GENERAL: Pleasant-appearing in no apparent distress.  HEAD, EYES, EARS, NOSE AND THROAT: Atraumatic, normocephalic. Extraocular muscles are intact. Pupils equal and reactive to light. Sclerae anicteric. No conjunctival injection. No oro-pharyngeal erythema.  NECK: Supple. There is no jugular venous distention. No bruits, no lymphadenopathy, no thyromegaly.  HEART: Regular rate and rhythm,. No murmurs, no rubs, no clicks.  LUNGS: Clear to auscultation bilaterally. No rales or rhonchi.  No wheezes.  ABDOMEN: Soft, flat, nontender, nondistended. Has good bowel sounds. No hepatosplenomegaly appreciated.  EXTREMITIES: Bilateral lower extremity erythema improved swelling less NEUROLOGIC: The patient is alert, awake, and oriented x3 with no focal motor or sensory deficits appreciated bilaterally.  SKIN: Moist and warm with no rashes appreciated.  Psych: Not anxious, depressed LN: No inguinal LN enlargement    Antibiotics   Anti-infectives (From admission, onward)   Start     Dose/Rate Route Frequency Ordered Stop   08/07/17 1200  vancomycin (VANCOCIN) IVPB 1000 mg/200 mL premix  Status:  Discontinued     1,000 mg 200 mL/hr over 60 Minutes Intravenous Every 12 hours 08/07/17 0512 08/08/17 1438   08/07/17 1200  Ampicillin-Sulbactam (UNASYN) 3 g in sodium chloride 0.9 % 100 mL IVPB     3 g 200 mL/hr over 30 Minutes Intravenous Every 6 hours 08/07/17 0846     08/07/17 0600  meropenem (MERREM) 1 g in sodium chloride 0.9 % 100 mL IVPB  Status:  Discontinued     1 g 200 mL/hr over 30 Minutes Intravenous Every 8 hours 08/07/17 0051 08/07/17 0846   08/07/17 0430  meropenem (MERREM) 1 g in sodium chloride 0.9 % 100 mL IVPB  Status:  Discontinued     1 g 200 mL/hr over 30 Minutes Intravenous  Once 08/07/17 0428 08/07/17 0430   08/07/17 0030  vancomycin (VANCOCIN) IVPB 1000  mg/200 mL premix     1,000 mg 200 mL/hr over 60 Minutes Intravenous  Once 08/07/17 0016 08/07/17 0607      Medications   Scheduled Meds: . atorvastatin  80 mg Oral Daily  . clopidogrel  75 mg Oral Daily  . enoxaparin (LOVENOX) injection  40 mg Subcutaneous Q24H  . furosemide  20 mg Oral BID  . hydrocerin   Topical BID  . insulin aspart  0-5 Units Subcutaneous QHS  . insulin aspart  0-9 Units Subcutaneous TID WC  . lisinopril  10 mg Oral Daily  . metoprolol succinate  25 mg Oral Daily  . [START ON 08/09/2017] potassium chloride  20 mEq Oral Daily  . potassium chloride  40 mEq Oral Q4H   Continuous Infusions: . ampicillin-sulbactam (UNASYN) IV Stopped (08/08/17 1201)   PRN Meds:.acetaminophen **OR** acetaminophen, ondansetron **OR** ondansetron (ZOFRAN) IV, oxyCODONE   Data Review:   Micro Results Recent Results (from the past 240 hour(s))  Culture, blood (Routine X 2) w Reflex to ID Panel     Status: None (Preliminary result)   Collection Time: 08/06/17  9:56 PM  Result Value Ref Range Status   Specimen Description BLOOD LEFT ANTECUBITAL  Final   Special Requests   Final    BOTTLES DRAWN AEROBIC AND ANAEROBIC Blood Culture results may not be optimal due to an excessive volume of blood received in culture bottles   Culture   Final    NO GROWTH 2 DAYS Performed at [facility], [address] [place], [place] [address]    Report Status PENDING  Incomplete  Culture, blood (Routine X 2) w Reflex to ID Panel     Status: None (Preliminary result)   Collection Time: 08/06/17 10:07 PM  Result Value Ref Range Status   Specimen Description BLOOD LEFT HAND  Final   Special Requests   Final    BOTTLES DRAWN AEROBIC AND ANAEROBIC Blood Culture adequate volume   Culture   Final    NO GROWTH 2 DAYS Performed at [facility], [address] [place],  [place] [address]    Report Status PENDING  Incomplete    Radiology Reports Ct Head Wo Contrast  Result  Date: 08/05/2017 CLINICAL DATA:  Headache, primarily frontal EXAM: CT HEAD WITHOUT CONTRAST TECHNIQUE: Contiguous axial images were obtained from the base of the skull through the vertex without intravenous contrast. COMPARISON:  May 26, 2014 FINDINGS: Brain: There is age related volume loss. There is no intracranial mass, hemorrhage, extra-axial fluid collection, or midline shift. There is slight small vessel disease in the centra semiovale bilaterally. Elsewhere gray-white compartments appear normal. Vascular: No evident hyperdense vessel. There is calcification in each carotid siphon region. Skull: Bony calvarium appears intact. Sinuses/Orbits: There is mucosal thickening in both sphenoid sinuses, more on the left than on the right. There is opacification in several ethmoid air cells bilaterally. The patient has had surgery in each ethmoid air cell region. Visualized orbits appear symmetric bilaterally. Other: Visualized mastoid air cells are clear. IMPRESSION: Age related volume loss with slight periventricular small vessel disease. No acute infarct evident. No mass or hemorrhage. Foci of arterial vascular calcification noted. Postoperative change noted in the ethmoid air cells. There are areas of paranasal sinus disease, most notably in the left sphenoid sinus posteriorly. There are foci of arterial vascular calcification evident. Electronically Signed   By: Bretta Bang III M.D.   On: 08/05/2017 15:08   US Venous Img Lower Bilateral  Result Date: 08/06/2017 CLINICAL DATA:  Redness in the lower extremities.  Evaluate for DVT. EXAM: BILATERAL LOWER EXTREMITY VENOUS DOPPLER ULTRASOUND TECHNIQUE: Gray-scale sonography with graded compression, as well as color Doppler and duplex ultrasound were performed to evaluate the lower extremity deep venous systems from the level of the common femoral vein and including the common femoral, femoral, profunda femoral, popliteal and calf veins including the  posterior tibial, peroneal and gastrocnemius veins when visible. The superficial great saphenous vein was also interrogated. Spectral Doppler was utilized to evaluate flow at rest and with distal augmentation maneuvers in the common femoral, femoral and popliteal veins. COMPARISON:  No comparison studies available. FINDINGS: RIGHT LOWER EXTREMITY Common Femoral Vein: No evidence of thrombus. Normal compressibility, respiratory phasicity and response to augmentation. Saphenofemoral Junction: No evidence of thrombus. Normal compressibility and flow on color Doppler imaging. Profunda Femoral Vein: No evidence of thrombus. Normal compressibility and flow on color Doppler imaging. Femoral Vein: No evidence of thrombus. Normal compressibility, respiratory phasicity and response to augmentation. Popliteal Vein: No evidence of thrombus. Normal compressibility, respiratory phasicity and response to augmentation. Calf Veins: No evidence of thrombus. Normal compressibility and flow on color Doppler imaging. Other Findings:  None. LEFT LOWER EXTREMITY Common Femoral Vein: No evidence of thrombus. Normal compressibility, respiratory phasicity and response to augmentation. Saphenofemoral Junction: No evidence of thrombus. Normal compressibility and flow on color Doppler imaging. Profunda Femoral Vein: No evidence of thrombus. Normal compressibility and flow on color Doppler imaging. Femoral Vein: No evidence of thrombus. Normal compressibility, respiratory phasicity and response to augmentation. Popliteal Vein: No evidence of thrombus. Normal compressibility, respiratory phasicity and response to augmentation. Calf Veins: No evidence of thrombus. Normal compressibility and flow on color Doppler imaging. Other Findings:  None. IMPRESSION: No evidence of deep venous thrombosis. Electronically Signed   By: Kennith Center M.D.   On: 08/06/2017 23:52     CBC Recent Labs  Lab 08/05/17 1412 08/06/17 2156  WBC 6.1 6.7  HGB 13.0  12.7*  HCT 39.2* 38.5*  PLT 160 159  MCV 80.0 79.8*  MCH 26.4 26.4  MCHC 33.1 33.1  RDW 16.2* 16.4*  LYMPHSABS 1.1 1.2  MONOABS 0.4 0.6  EOSABS 0.1 0.2  BASOSABS 0.0 0.1    Chemistries  Recent Labs  Lab 08/05/17 1412 08/06/17 2156 08/08/17 0356  NA 139 138 138  K 3.9 3.6 3.0*  CL 105 102 102  CO2 27 27 30   GLUCOSE 109* 112* 109*  BUN 13 20 18   CREATININE 0.82 0.99 1.01  CALCIUM 8.8* 9.0 8.0*  AST 24  --   --   ALT 13*  --   --   ALKPHOS 77  --   --   BILITOT 2.1*  --   --    ------------------------------------------------------------------------------------------------------------------ estimated creatinine clearance is 72.5 mL/min (by C-G formula based on SCr of 1.01 mg/dL). ------------------------------------------------------------------------------------------------------------------ No results for input(s): HGBA1C in the last 72 hours. ------------------------------------------------------------------------------------------------------------------ No results for input(s): CHOL, HDL, LDLCALC, TRIG, CHOLHDL, LDLDIRECT in the last 72 hours. ------------------------------------------------------------------------------------------------------------------ No results for input(s): TSH, T4TOTAL, T3FREE, THYROIDAB in the last 72 hours.  Invalid input(s): FREET3 ------------------------------------------------------------------------------------------------------------------ No results for input(s): VITAMINB12, FOLATE, FERRITIN, TIBC, IRON, RETICCTPCT in the last 72 hours.  Coagulation profile No results for input(s): INR, PROTIME in the last 168 hours.  No results for input(s): DDIMER in the last 72 hours.  Cardiac Enzymes No results for input(s): CKMB, TROPONINI, MYOGLOBIN in the last 168 hours.  Invalid input(s): CK ------------------------------------------------------------------------------------------------------------------ Invalid input(s):  POCBNP    Assessment & Plan  Patient's 84 year old presenting with lower extremity redness swelling   #1 cellulitis -improved Change IV antibiotics to Unasyn only discontinue vancomycin  #2 acute on chronic diastolic CHF increase dose oral lasix as taking at home  #3  Moderate to severe aortic stenosis outpatient follow-up  #4  Diabetes mellitus (HCC) -continue sliding scale insulin  #5.  Hypokalemia replace potassium   #6.  Coronary artery disease, non-occlusive -continue home meds   #7 Hypertension -continue home medications  #8  Chronic venous insufficiency -home dose Lasix, patient might benefit from compression wrappings once his cellulitis begins to heal   #9 Hyperlipidemia -home dose anti lipid          Code Status Orders  (From admission, onward)        Start     Ordered   08/07/17 0429  Full code  Continuous     08/07/17 0428    Code Status History    Date Active Date Inactive Code Status Order ID Comments User Context   05/13/2016 0435 05/13/2016 1813 Full Code [id number]  Arnaldo Nataliamond, Michael S, MD Inpatient   06/21/2015 0533 06/21/2015 1553 Full Code [id number]  Ihor AustinPyreddy, Pavan, MD ED           Consults cardiology  DVT Prophylaxis  Lovenox  Lab Results  Component Value Date   PLT 159 08/06/2017     Time Spent in minutes  35min Greater than 50% of time spent in care coordination and counseling patient regarding the condition and plan of care.   Auburn BilberryShreyang Krisinda Giovanni M.D on 08/08/2017 at 2:44 PM  Between 7am to 6pm - Pager - [phone]  After 6pm go to [email or web address] - Social research officer, governmentpassword [email or web address]  [employer]   Office  [phone]

## 2017-08-09 LAB — CBC
HEMATOCRIT: 35.4 % — AB (ref 40.0–52.0)
HEMOGLOBIN: 11.7 g/dL — AB (ref 13.0–18.0)
MCH: 26.5 pg (ref 26.0–34.0)
MCHC: 33 g/dL (ref 32.0–36.0)
MCV: 80.3 fL (ref 80.0–100.0)
Platelets: 138 10*3/uL — ABNORMAL LOW (ref 150–440)
RBC: 4.41 MIL/uL (ref 4.40–5.90)
RDW: 16.2 % — ABNORMAL HIGH (ref 11.5–14.5)
WBC: 6 10*3/uL (ref 3.8–10.6)

## 2017-08-09 LAB — BASIC METABOLIC PANEL
Anion gap: 8 (ref 5–15)
BUN: 18 mg/dL (ref 6–20)
CHLORIDE: 102 mmol/L (ref 101–111)
CO2: 27 mmol/L (ref 22–32)
Calcium: 8.1 mg/dL — ABNORMAL LOW (ref 8.9–10.3)
Creatinine, Ser: 0.91 mg/dL (ref 0.61–1.24)
GFR calc Af Amer: >60 mL/min (ref >60)
GFR calc non Af Amer: >60 mL/min (ref >60)
GLUCOSE: 96 mg/dL (ref 65–99)
POTASSIUM: 3.8 mmol/L (ref 3.5–5.1)
Sodium: 137 mmol/L (ref 135–145)

## 2017-08-09 LAB — GLUCOSE, CAPILLARY
GLUCOSE-CAPILLARY: 114 mg/dL — AB (ref 65–99)
Glucose-Capillary: 86 mg/dL (ref 65–99)
Glucose-Capillary: 105 mg/dL — ABNORMAL HIGH (ref 65–99)
Glucose-Capillary: 91 mg/dL (ref 65–99)

## 2017-08-09 MED ORDER — AMOXICILLIN-POT CLAVULANATE 875-125 MG PO TABS
1.0000 | ORAL_TABLET | Freq: Two times a day (BID) | ORAL | Status: DC
  Administered 2017-08-09 – 2017-08-10 (×2): 1 via ORAL
  Filled 2017-08-09 (×2): qty 1

## 2017-08-09 NOTE — Progress Notes (Signed)
Clinical Child psychotherapistocial Worker (CSW) has attempted to contact patient's daughter [name] several times today to update her on the D/C plan, however she has not answered and a voicemail could not be left.   Baker Hughes IncorporatedBailey Nikyla Navedo, LCSW [phone]

## 2017-08-09 NOTE — Evaluation (Signed)
Physical Therapy Evaluation Patient Details Name: [name] Pam MRN: [id number] DOB: Sep 24, 1930 Today's Date: 08/09/2017   History of Present Illness  Ptis an86 y.o.malewho presents with leg pain, weakness. Patient was brought to the ED by EMS. EMS states that his house was in significant disarray and patient was found to have bedbugs in the ED. He has bilateral lower extremity cellulitis. Hospitalist were called for admission.  Assessment includes: cellulitis, acute on chronic diastolic CHF, mod to severe aortic stenosis, DM, hypokalemia, CAD, HTN, chronic venous insufficiency, and HLD.    Clinical Impression  Pt presents with deficits in strength, transfers, mobility, gait, balance, and activity tolerance.  Pt required extensive assistance for BLEs and upper body in and out of bed during bed mobility assessment.  Pt was Mod A to stand from an elevated EOB with heavy cues for proper sequencing required.  Pt required heavy use of BUEs on RW with several very small shuffling steps with difficulty advancing RLE secondary to weakness/instability on the LLE.  Overall pt is very weak functionally and would not be safe to return to his prior living situation at this time.  Pt will benefit from PT services in a SNF setting upon discharge to safely address above deficits for decreased caregiver assistance and eventual return to PLOF.      Follow Up Recommendations SNF;Supervision for mobility/OOB    Equipment Recommendations  None recommended by PT    Recommendations for Other Services       Precautions / Restrictions Precautions Precautions: Fall Restrictions Weight Bearing Restrictions: No      Mobility  Bed Mobility Overal bed mobility: Needs Assistance Bed Mobility: Rolling;Supine to Sit;Sit to Supine Rolling: Max assist   Supine to sit: Max assist Sit to supine: Max assist   General bed mobility comments: Extensive verbal cues for sequencing  Transfers Overall transfer  level: Needs assistance Equipment used: Rolling walker (2 wheeled) Transfers: Sit to/from Stand Sit to Stand: Mod assist         General transfer comment: Mod verbal cues for sequencing for proper hand placement and for increased trunk flex  Ambulation/Gait Ambulation/Gait assistance: Min assist Ambulation Distance (Feet): 1 Feet Assistive device: Rolling walker (2 wheeled) Gait Pattern/deviations: Shuffle;Step-to pattern;Trunk flexed;Decreased stance time - left   Gait velocity interpretation: <1.8 ft/sec, indicative of risk for recurrent falls General Gait Details: Heavy use of RW with several very small shuffling steps with difficulty advancing RLE secondary to weakness/instability on the LLE  Stairs            Wheelchair Mobility    Modified Rankin (Stroke Patients Only)       Balance Overall balance assessment: Needs assistance Sitting-balance support: Feet unsupported;No upper extremity supported Sitting balance-Leahy Scale: Good     Standing balance support: Bilateral upper extremity supported Standing balance-Leahy Scale: Fair Standing balance comment: Heavy reliance on BUE support on RW in standing                             Pertinent Vitals/Pain Pain Assessment: No/denies pain    Home Living Family/patient expects to be discharged to:: Private residence Living Arrangements: Alone Available Help at Discharge: Family;Available PRN/intermittently Type of Home: House Home Access: Level entry     Home Layout: Two level;Able to live on main level with bedroom/bathroom Home Equipment: Dan HumphreysWalker - 2 wheels;Walker - standard      Prior Function Level of Independence: Independent with assistive device(s)  Comments: Mod Ind amb limited community distances with a SW, Ind with ADLs, no fall history, drives and takes SW with him     Hand Dominance   Dominant Hand: Right    Extremity/Trunk Assessment   Upper Extremity  Assessment Upper Extremity Assessment: Overall WFL for tasks assessed    Lower Extremity Assessment Lower Extremity Assessment: Generalized weakness;RLE deficits/detail;LLE deficits/detail RLE Deficits / Details: hip flex 3-/5, knee flex and ext 4/5 LLE Deficits / Details: hip flex 2+/5, knee flex and ext 3+/5; no pain at rest but pain with movement LLE: Unable to fully assess due to pain       Communication   Communication: No difficulties  Cognition Arousal/Alertness: Awake/alert Behavior During Therapy: WFL for tasks assessed/performed Overall Cognitive Status: Within Functional Limits for tasks assessed                                        General Comments      Exercises Total Joint Exercises Ankle Circles/Pumps: AROM;Both;5 reps;10 reps Quad Sets: Strengthening;Both;5 reps;10 reps Gluteal Sets: Strengthening;Both;10 reps Short Arc Quad: AROM;Both;5 reps Hip ABduction/ADduction: AAROM;Both;5 reps Straight Leg Raises: AAROM;Both;5 reps Long Arc Quad: AROM;Both;10 reps Knee Flexion: AROM;Both;10 reps Other Exercises Other Exercises: HEP education for BLE APs, QS, and GS x 10 each 5-6x/Gura   Assessment/Plan    PT Assessment Patient needs continued PT services  PT Problem List Decreased strength;Decreased activity tolerance;Decreased balance;Decreased mobility;Decreased knowledge of use of DME       PT Treatment Interventions DME instruction;Gait training;Functional mobility training;Therapeutic exercise;Balance training;Therapeutic activities;Patient/family education    PT Goals (Current goals can be found in the Care Plan section)  Acute Rehab PT Goals Patient Stated Goal: To be able to go outside and fish PT Goal Formulation: With patient Time For Goal Achievement: 08/22/17 Potential to Achieve Goals: Fair    Frequency Min 2X/week   Barriers to discharge Inaccessible home environment;Decreased caregiver support      Co-evaluation                AM-PAC PT "6 Clicks" Daily Activity  Outcome Measure Difficulty turning over in bed (including adjusting bedclothes, sheets and blankets)?: Unable Difficulty moving from lying on back to sitting on the side of the bed? : Unable Difficulty sitting down on and standing up from a chair with arms (e.g., wheelchair, bedside commode, etc,.)?: Unable Help needed moving to and from a bed to chair (including a wheelchair)?: A Lot Help needed walking in hospital room?: Total Help needed climbing 3-5 steps with a railing? : Total 6 Click Score: 7    End of Session Equipment Utilized During Treatment: Gait belt Activity Tolerance: Patient tolerated treatment well Patient left: in bed;with call bell/phone within reach;with bed alarm set Nurse Communication: Mobility status PT Visit Diagnosis: Muscle weakness (generalized) (M62.81);Difficulty in walking, not elsewhere classified (R26.2)    Time: 1610-9604 PT Time Calculation (min) (ACUTE ONLY): 35 min   Charges:   PT Evaluation $PT Eval Low Complexity: 1 Low PT Treatments $Therapeutic Exercise: 8-22 mins   PT G Codes:        DElly Modena PT, DPT 08/09/17, 11:49 AM

## 2017-08-09 NOTE — Clinical Social Work Note (Addendum)
Clinical Social Work Assessment  Patient Details  Name: [name] MRN: [id number] Date of Birth: 1931/01/26  Date of referral:  08/09/17               Reason for consult:  Facility Placement                Permission sought to share information with:  Chartered certified accountant granted to share information::  Yes, Verbal Permission Granted  Name::      [name]::   [name]  Relationship::     Contact Information:     Housing/Transportation Living arrangements for the past 2 months:  [name] of Information:  Patient Patient Interpreter Needed:  None Criminal Activity/Legal Involvement Pertinent to Current Situation/Hospitalization:  No - Comment as needed Significant Relationships:  Adult Children Lives with:  Self Do you feel safe going back to the place where you live?  Yes Need for family participation in patient care:  Yes (Comment)  Care giving concerns: Patient lives alone in [place].   Social Worker assessment / plan: Holiday representative (CSW) received SNF consult. Patient is interested in SNF placement. PT is pending. Social work Theatre manager met with patient alone at bedside. Patient was laying in bed alert and oriented x4, but at times was confused. Social work Theatre manager introduced self and explained the role of the [place]. Patient shared he lives in [place] alone and daughter [name] [phone]) is his primary contact. Social work Theatre manager explained that PT is pending, having bed bugs will be a barrier for SNF placement, and that Medicare requires a qualifying 3 night inpatient stay in the hospital. Patient was admitted inpatient on 08/07/17. Patient verbalized his understanding and is agreeable to SNF placement. FL2 completed and faxed out.   Per RN patient no longer needs to be on isolation precaution for bed bugs. Patient prefers [facility] in [place] to be closer to his wife, who is a  long-term resident there. Patient is agreeable to [facility] if [facility] does not make an offer for him. Social work Theatre manager attempted to contact patient's daughter [name] and could not leave a Advertising account executive. Social work Theatre manager also contacted [name] to make him aware of above. Patient's brother is agreeable for patient to complete SNF placement at [facility]. CSW will continue to follow up and assist.  Employment status:  Retired Forensic scientist:  Medicare PT Recommendations:  Not assessed at this time Information / Referral to community resources:     Patient/Family's Response to care: Patient is interested in SNF placement at [facility] pending PT recommendations.  Patient/Family's Understanding of and Emotional Response to Diagnosis, Current Treatment, and Prognosis: Patient was pleasant and thanked social work Theatre manager for her assistance.  Emotional Assessment Appearance:  Appears stated age Attitude/Demeanor/Rapport:    Affect (typically observed):  Calm, Pleasant, Accepting Orientation:  Oriented to Situation, Oriented to  Time, Oriented to Place, Oriented to Self Alcohol / Substance use:  Not Applicable Psych involvement (Current and /or in the community):  No (Comment)  Discharge Needs  Concerns to be addressed:  Care Coordination, Discharge Planning Concerns Readmission within the last 30 days:  No Current discharge risk:  Dependent with Mobility Barriers to Discharge:  Continued Medical Work up   Smith Mince, Student-Social Work 08/09/2017, 11:00 AM

## 2017-08-09 NOTE — Progress Notes (Addendum)
Per Marylene LandAngela [facility] [place] admissions coordinator they can make a bed offer for patient. Social work Tax inspectorintern presented bed offer to patient and explained that [facility] does not want patient to bring any items from home. Social work Tax inspectorintern also explained that patient should try to get his home debugged before he comes home from rehab. Patient verbalized his understanding and chose [facility] in [place]. Marylene LandAngela, admission coordinator at [facility], is aware of accepted bed offer. Social work Tax inspectorintern contacted patient's brother Gabriel RungJoe to make him aware of above.   Molli KnockAnanda Hodgson, Social Work Intern [phone]

## 2017-08-09 NOTE — Progress Notes (Signed)
Hold Lisinopril per MD.

## 2017-08-09 NOTE — Progress Notes (Signed)
[employer] - [place] at [facility]                                                                                                                                                                                  Patient Demographics   [name], is a 84 y.o. male, DOB - 1930-09-11, RUE:[id number]  Admit date - 08/06/2017   Admitting Physician Oralia Manis, MD  Outpatient Primary MD for the patient is Glori Luis, MD   LOS - 2  Subjective: Swelling continues to improve the redness much improved as well   Review of Systems:   CONSTITUTIONAL: No documented fever.  Positive fatigue, positive weakness. No weight gain, no weight loss.  EYES: No blurry or double vision.  ENT: No tinnitus. No postnasal drip. No redness of the oropharynx.  RESPIRATORY: No cough, no wheeze, no hemoptysis. No dyspnea.  CARDIOVASCULAR: No chest pain. No orthopnea. No palpitations. No syncope.  GASTROINTESTINAL: No nausea, no vomiting or diarrhea. No abdominal pain. No melena or hematochezia.  GENITOURINARY: No dysuria or hematuria.  ENDOCRINE: No polyuria or nocturia. No heat or cold intolerance.  HEMATOLOGY: No anemia. No bruising. No bleeding.  INTEGUMENTARY: Positive for extremity swelling and redness MUSCULOSKELETAL: No arthritis. No swelling. No gout.  NEUROLOGIC: No numbness, tingling, or ataxia. No seizure-type activity.  PSYCHIATRIC: No anxiety. No insomnia. No ADD.    Vitals:   Vitals:   08/08/17 1948 08/09/17 0446 08/09/17 0810 08/09/17 1138  BP: (!) 120/40 (!) 110/41 (!) 130/42   Pulse: 73 70 72 81  Resp: 18 18 18    Temp: 99.5 F (37.5 C) 98.2 F (36.8 C) 98.1 F (36.7 C)   TempSrc: Oral Oral Oral   SpO2: 96% 96% 97% 95%  Weight:      Height:        Wt Readings from Last 3 Encounters:  08/07/17 269 lb 6.4 oz (122.2 kg)  08/05/17 220 lb (99.8 kg)  07/07/17 277 lb (125.6 kg)     Intake/Output Summary (Last 24 hours) at 08/09/2017 1458 Last data filed at  08/09/2017 1300 Gross per 24 hour  Intake 240 ml  Output 600 ml  Net -360 ml    Physical Exam:   GENERAL: Pleasant-appearing in no apparent distress.  HEAD, EYES, EARS, NOSE AND THROAT: Atraumatic, normocephalic. Extraocular muscles are intact. Pupils equal and reactive to light. Sclerae anicteric. No conjunctival injection. No oro-pharyngeal erythema.  NECK: Supple. There is no jugular venous distention. No bruits, no lymphadenopathy, no thyromegaly.  HEART: Regular rate and rhythm,. No murmurs, no rubs, no clicks.  LUNGS: Clear to auscultation bilaterally. No rales or rhonchi. No wheezes.  ABDOMEN: Soft, flat,  nontender, nondistended. Has good bowel sounds. No hepatosplenomegaly appreciated.  EXTREMITIES: Bilateral lower extremity erythema improved swelling less NEUROLOGIC: The patient is alert, awake, and oriented x3 with no focal motor or sensory deficits appreciated bilaterally.  SKIN: Moist and warm with no rashes appreciated.  Psych: Not anxious, depressed LN: No inguinal LN enlargement    Antibiotics   Anti-infectives (From admission, onward)   Start     Dose/Rate Route Frequency Ordered Stop   08/09/17 2000  amoxicillin-clavulanate (AUGMENTIN) 875-125 MG per tablet 1 tablet     1 tablet Oral Every 12 hours 08/09/17 1451     08/07/17 1200  vancomycin (VANCOCIN) IVPB 1000 mg/200 mL premix  Status:  Discontinued     1,000 mg 200 mL/hr over 60 Minutes Intravenous Every 12 hours 08/07/17 0512 08/08/17 1438   08/07/17 1200  Ampicillin-Sulbactam (UNASYN) 3 g in sodium chloride 0.9 % 100 mL IVPB  Status:  Discontinued     3 g 200 mL/hr over 30 Minutes Intravenous Every 6 hours 08/07/17 0846 08/09/17 1451   08/07/17 0600  meropenem (MERREM) 1 g in sodium chloride 0.9 % 100 mL IVPB  Status:  Discontinued     1 g 200 mL/hr over 30 Minutes Intravenous Every 8 hours 08/07/17 0051 08/07/17 0846   08/07/17 0430  meropenem (MERREM) 1 g in sodium chloride 0.9 % 100 mL IVPB  Status:   Discontinued     1 g 200 mL/hr over 30 Minutes Intravenous  Once 08/07/17 0428 08/07/17 0430   08/07/17 0030  vancomycin (VANCOCIN) IVPB 1000 mg/200 mL premix     1,000 mg 200 mL/hr over 60 Minutes Intravenous  Once 08/07/17 0016 08/07/17 0607      Medications   Scheduled Meds: . amoxicillin-clavulanate  1 tablet Oral Q12H  . atorvastatin  80 mg Oral Daily  . clopidogrel  75 mg Oral Daily  . enoxaparin (LOVENOX) injection  40 mg Subcutaneous Q24H  . furosemide  40 mg Oral BID  . hydrocerin   Topical BID  . insulin aspart  0-5 Units Subcutaneous QHS  . insulin aspart  0-9 Units Subcutaneous TID WC  . lisinopril  10 mg Oral Daily  . metoprolol succinate  25 mg Oral Daily  . potassium chloride  20 mEq Oral Daily   Continuous Infusions:  PRN Meds:.acetaminophen **OR** acetaminophen, alum & mag hydroxide-simeth, ondansetron **OR** ondansetron (ZOFRAN) IV, oxyCODONE   Data Review:   Micro Results Recent Results (from the past 240 hour(s))  Culture, blood (Routine X 2) w Reflex to ID Panel     Status: None (Preliminary result)   Collection Time: 08/06/17  9:56 PM  Result Value Ref Range Status   Specimen Description BLOOD LEFT ANTECUBITAL  Final   Special Requests   Final    BOTTLES DRAWN AEROBIC AND ANAEROBIC Blood Culture results may not be optimal due to an excessive volume of blood received in culture bottles   Culture   Final    NO GROWTH 3 DAYS Performed at [facility], [address] [place], [place] [address]    Report Status PENDING  Incomplete  Culture, blood (Routine X 2) w Reflex to ID Panel     Status: None (Preliminary result)   Collection Time: 08/06/17 10:07 PM  Result Value Ref Range Status   Specimen Description BLOOD LEFT HAND  Final   Special Requests   Final    BOTTLES DRAWN AEROBIC AND ANAEROBIC Blood Culture adequate volume   Culture   Final  NO GROWTH 3 DAYS Performed at [facility], [address] [place], [place]  [address]    Report Status PENDING  Incomplete    Radiology Reports Ct Head Wo Contrast  Result Date: 08/05/2017 CLINICAL DATA:  Headache, primarily frontal EXAM: CT HEAD WITHOUT CONTRAST TECHNIQUE: Contiguous axial images were obtained from the base of the skull through the vertex without intravenous contrast. COMPARISON:  May 26, 2014 FINDINGS: Brain: There is age related volume loss. There is no intracranial mass, hemorrhage, extra-axial fluid collection, or midline shift. There is slight small vessel disease in the centra semiovale bilaterally. Elsewhere gray-white compartments appear normal. Vascular: No evident hyperdense vessel. There is calcification in each carotid siphon region. Skull: Bony calvarium appears intact. Sinuses/Orbits: There is mucosal thickening in both sphenoid sinuses, more on the left than on the right. There is opacification in several ethmoid air cells bilaterally. The patient has had surgery in each ethmoid air cell region. Visualized orbits appear symmetric bilaterally. Other: Visualized mastoid air cells are clear. IMPRESSION: Age related volume loss with slight periventricular small vessel disease. No acute infarct evident. No mass or hemorrhage. Foci of arterial vascular calcification noted. Postoperative change noted in the ethmoid air cells. There are areas of paranasal sinus disease, most notably in the left sphenoid sinus posteriorly. There are foci of arterial vascular calcification evident. Electronically Signed   By: Bretta Bang III M.D.   On: 08/05/2017 15:08   US Venous Img Lower Bilateral  Result Date: 08/06/2017 CLINICAL DATA:  Redness in the lower extremities.  Evaluate for DVT. EXAM: BILATERAL LOWER EXTREMITY VENOUS DOPPLER ULTRASOUND TECHNIQUE: Gray-scale sonography with graded compression, as well as color Doppler and duplex ultrasound were performed to evaluate the lower extremity deep venous systems from the level of the common femoral vein and  including the common femoral, femoral, profunda femoral, popliteal and calf veins including the posterior tibial, peroneal and gastrocnemius veins when visible. The superficial great saphenous vein was also interrogated. Spectral Doppler was utilized to evaluate flow at rest and with distal augmentation maneuvers in the common femoral, femoral and popliteal veins. COMPARISON:  No comparison studies available. FINDINGS: RIGHT LOWER EXTREMITY Common Femoral Vein: No evidence of thrombus. Normal compressibility, respiratory phasicity and response to augmentation. Saphenofemoral Junction: No evidence of thrombus. Normal compressibility and flow on color Doppler imaging. Profunda Femoral Vein: No evidence of thrombus. Normal compressibility and flow on color Doppler imaging. Femoral Vein: No evidence of thrombus. Normal compressibility, respiratory phasicity and response to augmentation. Popliteal Vein: No evidence of thrombus. Normal compressibility, respiratory phasicity and response to augmentation. Calf Veins: No evidence of thrombus. Normal compressibility and flow on color Doppler imaging. Other Findings:  None. LEFT LOWER EXTREMITY Common Femoral Vein: No evidence of thrombus. Normal compressibility, respiratory phasicity and response to augmentation. Saphenofemoral Junction: No evidence of thrombus. Normal compressibility and flow on color Doppler imaging. Profunda Femoral Vein: No evidence of thrombus. Normal compressibility and flow on color Doppler imaging. Femoral Vein: No evidence of thrombus. Normal compressibility, respiratory phasicity and response to augmentation. Popliteal Vein: No evidence of thrombus. Normal compressibility, respiratory phasicity and response to augmentation. Calf Veins: No evidence of thrombus. Normal compressibility and flow on color Doppler imaging. Other Findings:  None. IMPRESSION: No evidence of deep venous thrombosis. Electronically Signed   By: Kennith Center M.D.   On:  08/06/2017 23:52     CBC Recent Labs  Lab 08/05/17 1412 08/06/17 2156 08/09/17 0723  WBC 6.1 6.7 6.0  HGB 13.0 12.7*  11.7*  HCT 39.2* 38.5* 35.4*  PLT 160 159 138*  MCV 80.0 79.8* 80.3  MCH 26.4 26.4 26.5  MCHC 33.1 33.1 33.0  RDW 16.2* 16.4* 16.2*  LYMPHSABS 1.1 1.2  --   MONOABS 0.4 0.6  --   EOSABS 0.1 0.2  --   BASOSABS 0.0 0.1  --     Chemistries  Recent Labs  Lab 08/05/17 1412 08/06/17 2156 08/08/17 0356 08/09/17 0723  NA 139 138 138 137  K 3.9 3.6 3.0* 3.8  CL 105 102 102 102  CO2 27 27 30 27   GLUCOSE 109* 112* 109* 96  BUN 13 20 18 18   CREATININE 0.82 0.99 1.01 0.91  CALCIUM 8.8* 9.0 8.0* 8.1*  AST 24  --   --   --   ALT 13*  --   --   --   ALKPHOS 77  --   --   --   BILITOT 2.1*  --   --   --    ------------------------------------------------------------------------------------------------------------------ estimated creatinine clearance is 80.4 mL/min (by C-G formula based on SCr of 0.91 mg/dL). ------------------------------------------------------------------------------------------------------------------ No results for input(s): HGBA1C in the last 72 hours. ------------------------------------------------------------------------------------------------------------------ No results for input(s): CHOL, HDL, LDLCALC, TRIG, CHOLHDL, LDLDIRECT in the last 72 hours. ------------------------------------------------------------------------------------------------------------------ No results for input(s): TSH, T4TOTAL, T3FREE, THYROIDAB in the last 72 hours.  Invalid input(s): FREET3 ------------------------------------------------------------------------------------------------------------------ No results for input(s): VITAMINB12, FOLATE, FERRITIN, TIBC, IRON, RETICCTPCT in the last 72 hours.  Coagulation profile No results for input(s): INR, PROTIME in the last 168 hours.  No results for input(s): DDIMER in the last 72 hours.  Cardiac Enzymes No  results for input(s): CKMB, TROPONINI, MYOGLOBIN in the last 168 hours.  Invalid input(s): CK ------------------------------------------------------------------------------------------------------------------ Invalid input(s): POCBNP    Assessment & Plan  Patient's 84 year old presenting with lower extremity redness swelling   #1 cellulitis -improved Change antibiotics to oral Augmentin  #2 acute on chronic diastolic CHF continue oral  #3  Moderate to severe aortic stenosis outpatient follow-up  #4  Diabetes mellitus (HCC) -continue sliding scale insulin  #5.  Hypokalemia replace potassium   #6.  Coronary artery disease, non-occlusive -continue home meds   #7 Hypertension -continue home medications  #8  Chronic venous insufficiency -home dose Lasix, patient might benefit from compression wrappings once his cellulitis begins to heal   #9 Hyperlipidemia -home dose anti lipid          Code Status Orders  (From admission, onward)        Start     Ordered   08/07/17 0429  Full code  Continuous     08/07/17 0428    Code Status History    Date Active Date Inactive Code Status Order ID Comments User Context   05/13/2016 0435 05/13/2016 1813 Full Code [id number]  Arnaldo Natal, MD Inpatient   06/21/2015 0533 06/21/2015 1553 Full Code [id number]  Ihor Austin, MD ED           Consults cardiology  DVT Prophylaxis  Lovenox  Lab Results  Component Value Date   PLT 138 (L) 08/09/2017     Time Spent in minutes  Greater than 50% of time spent in care coordination and counseling patient regarding the condition and plan of care.   Auburn Bilberry M.D on 08/09/2017 at 2:58 PM  Between 7am to 6pm - Pager - [phone]  After 6pm go to [email or web address] - Social research officer, government  [employer]   Office  [phone]

## 2017-08-09 NOTE — Clinical Social Work Placement (Signed)
   CLINICAL SOCIAL WORK PLACEMENT  NOTE  Date:  08/09/2017  Patient Details  Name: [name] MRN: [id number] Date of Birth: [birth year]  Clinical Social Work is seeking post-discharge placement for this patient at the Skilled  Nursing Facility level of care (*CSW will initial, date and re-position this form in  chart as items are completed):  Yes   Patient/family provided with [place] Clinical Social Work Department's list of facilities offering this level of care within the geographic area requested by the patient (or if unable, by the patient's family).  Yes   Patient/family informed of their freedom to choose among providers that offer the needed level of care, that participate in Medicare, Medicaid or managed care program needed by the patient, have an available bed and are willing to accept the patient.  Yes   Patient/family informed of [place]'s ownership interest in [facility] and [facility], as well as of the fact that they are under no obligation to receive care at these facilities.  PASRR submitted to EDS on 08/08/17     PASRR number received on 08/08/17     Existing PASRR number confirmed on       FL2 transmitted to all facilities in geographic area requested by pt/family on 08/08/17     FL2 transmitted to all facilities within larger geographic area on       Patient informed that his/her managed care company has contracts with or will negotiate with certain facilities, including the following:        Yes   Patient/family informed of bed offers received.  Patient chooses bed at       Physician recommends and patient chooses bed at      Patient to be transferred to   on  .  Patient to be transferred to facility by       Patient family notified on   of transfer.  Name of family member notified:        PHYSICIAN       Additional Comment:    _______________________________________________ Monita Swier, Darleen CrockerBailey M, LCSW 08/09/2017, 12:14 PM

## 2017-08-10 ENCOUNTER — Telehealth: Payer: Self-pay | Admitting: Cardiovascular Disease

## 2017-08-10 DIAGNOSIS — E119 Type 2 diabetes mellitus without complications: Secondary | ICD-10-CM | POA: Diagnosis not present

## 2017-08-10 DIAGNOSIS — I10 Essential (primary) hypertension: Secondary | ICD-10-CM | POA: Diagnosis not present

## 2017-08-10 DIAGNOSIS — M25562 Pain in left knee: Secondary | ICD-10-CM | POA: Diagnosis not present

## 2017-08-10 DIAGNOSIS — R278 Other lack of coordination: Secondary | ICD-10-CM | POA: Diagnosis not present

## 2017-08-10 DIAGNOSIS — I890 Lymphedema, not elsewhere classified: Secondary | ICD-10-CM | POA: Diagnosis not present

## 2017-08-10 DIAGNOSIS — M1612 Unilateral primary osteoarthritis, left hip: Secondary | ICD-10-CM | POA: Diagnosis not present

## 2017-08-10 DIAGNOSIS — I5033 Acute on chronic diastolic (congestive) heart failure: Secondary | ICD-10-CM | POA: Diagnosis not present

## 2017-08-10 DIAGNOSIS — L0390 Cellulitis, unspecified: Secondary | ICD-10-CM | POA: Diagnosis not present

## 2017-08-10 DIAGNOSIS — R2681 Unsteadiness on feet: Secondary | ICD-10-CM | POA: Diagnosis not present

## 2017-08-10 DIAGNOSIS — R4182 Altered mental status, unspecified: Secondary | ICD-10-CM | POA: Diagnosis not present

## 2017-08-10 DIAGNOSIS — R41841 Cognitive communication deficit: Secondary | ICD-10-CM | POA: Diagnosis not present

## 2017-08-10 DIAGNOSIS — M79604 Pain in right leg: Secondary | ICD-10-CM | POA: Diagnosis not present

## 2017-08-10 DIAGNOSIS — I872 Venous insufficiency (chronic) (peripheral): Secondary | ICD-10-CM | POA: Diagnosis not present

## 2017-08-10 DIAGNOSIS — I2510 Atherosclerotic heart disease of native coronary artery without angina pectoris: Secondary | ICD-10-CM | POA: Diagnosis not present

## 2017-08-10 DIAGNOSIS — E782 Mixed hyperlipidemia: Secondary | ICD-10-CM | POA: Diagnosis not present

## 2017-08-10 DIAGNOSIS — I350 Nonrheumatic aortic (valve) stenosis: Secondary | ICD-10-CM | POA: Diagnosis not present

## 2017-08-10 DIAGNOSIS — R0602 Shortness of breath: Secondary | ICD-10-CM | POA: Diagnosis not present

## 2017-08-10 DIAGNOSIS — I5032 Chronic diastolic (congestive) heart failure: Secondary | ICD-10-CM | POA: Diagnosis not present

## 2017-08-10 DIAGNOSIS — L03115 Cellulitis of right lower limb: Secondary | ICD-10-CM | POA: Diagnosis not present

## 2017-08-10 DIAGNOSIS — M6281 Muscle weakness (generalized): Secondary | ICD-10-CM | POA: Diagnosis not present

## 2017-08-10 DIAGNOSIS — L03116 Cellulitis of left lower limb: Secondary | ICD-10-CM | POA: Diagnosis not present

## 2017-08-10 LAB — BASIC METABOLIC PANEL
ANION GAP: 9 (ref 5–15)
BUN: 17 mg/dL (ref 6–20)
CHLORIDE: 100 mmol/L — AB (ref 101–111)
CO2: 27 mmol/L (ref 22–32)
Calcium: 8.2 mg/dL — ABNORMAL LOW (ref 8.9–10.3)
Creatinine, Ser: 0.81 mg/dL (ref 0.61–1.24)
GFR calc Af Amer: >60 mL/min (ref >60)
GFR calc non Af Amer: >60 mL/min (ref >60)
GLUCOSE: 92 mg/dL (ref 65–99)
POTASSIUM: 3.8 mmol/L (ref 3.5–5.1)
Sodium: 136 mmol/L (ref 135–145)

## 2017-08-10 LAB — GLUCOSE, CAPILLARY: Glucose-Capillary: 87 mg/dL (ref 65–99)

## 2017-08-10 MED ORDER — HYDROCERIN EX CREA: 1.0000 "application " | TOPICAL_CREAM | Freq: Two times a day (BID) | CUTANEOUS | 0 refills | Status: DC

## 2017-08-10 MED ORDER — POLYETHYLENE GLYCOL 3350 17 G PO PACK: 17.0000 g | PACK | Freq: Every day | ORAL | 0 refills | Status: DC

## 2017-08-10 MED ORDER — FUROSEMIDE 40 MG PO TABS: 40.0000 mg | ORAL_TABLET | Freq: Two times a day (BID) | ORAL | Status: DC

## 2017-08-10 MED ORDER — DOCUSATE SODIUM 100 MG PO CAPS: 200.0000 mg | ORAL_CAPSULE | Freq: Two times a day (BID) | ORAL | 0 refills | Status: DC

## 2017-08-10 MED ORDER — AMOXICILLIN-POT CLAVULANATE 875-125 MG PO TABS: 1.0000 | ORAL_TABLET | Freq: Two times a day (BID) | ORAL | 0 refills | Status: AC

## 2017-08-10 MED ORDER — DOCUSATE SODIUM 100 MG PO CAPS
200.0000 mg | ORAL_CAPSULE | Freq: Two times a day (BID) | ORAL | Status: DC
  Administered 2017-08-10: 200 mg via ORAL
  Filled 2017-08-10: qty 2

## 2017-08-10 MED ORDER — POLYETHYLENE GLYCOL 3350 17 G PO PACK
17.0000 g | PACK | Freq: Every day | ORAL | Status: DC
  Administered 2017-08-10: 17 g via ORAL
  Filled 2017-08-10: qty 1

## 2017-08-10 NOTE — Progress Notes (Signed)
Report called and given to [place] at [facility]. EMS called for transport. IV removed. Pt dressed and ready for transport.

## 2017-08-10 NOTE — Telephone Encounter (Signed)
Patient contacted regarding discharge from [facility] on 08/10/17.  Patient understands to follow up with provider Dr. Mariah MillingGollan on 08/18/17 at 10:20AM at [facility]. Patient understands discharge instructions? Pending Patient understands medications and regiment? Pending Patient understands to bring all medications to this visit? Pending  Spoke with patients daughter per release form and she states that the patient is still in the hospital and will be discharged to a rehab facility. She confirmed appointment information and had no further questions at this time.

## 2017-08-10 NOTE — Clinical Social Work Placement (Signed)
   CLINICAL SOCIAL WORK PLACEMENT  NOTE  Date:  08/10/2017  Patient Details  Name: [name] MRN: [id number] Date of Birth: 04-Mar-1931  Clinical Social Work is seeking post-discharge placement for this patient at the Skilled  Nursing Facility level of care (*CSW will initial, date and re-position this form in  chart as items are completed):  Yes   Patient/family provided with [place] Clinical Social Work Department's list of facilities offering this level of care within the geographic area requested by the patient (or if unable, by the patient's family).  Yes   Patient/family informed of their freedom to choose among providers that offer the needed level of care, that participate in Medicare, Medicaid or managed care program needed by the patient, have an available bed and are willing to accept the patient.  Yes   Patient/family informed of [place]'s ownership interest in [facility] and [facility], as well as of the fact that they are under no obligation to receive care at these facilities.  PASRR submitted to EDS on 08/08/17     PASRR number received on 08/08/17     Existing PASRR number confirmed on       FL2 transmitted to all facilities in geographic area requested by pt/family on 08/08/17     FL2 transmitted to all facilities within larger geographic area on       Patient informed that his/her managed care company has contracts with or will negotiate with certain facilities, including the following:        Yes   Patient/family informed of bed offers received.  Patient [facility] bed at [facility] [place] )     Physician recommends and patient chooses bed at      Patient to be transferred to ([facility] [place]) on 08/10/17.  Patient to be transferred to facility [facility]     Patient family notified on 08/10/17 of transfer.  Name of family member notified:  ([name] is aware of D/C today. )     PHYSICIAN        Additional Comment:    _______________________________________________ Faith Patricelli, Darleen CrockerBailey M, LCSW 08/10/2017, 9:49 AM

## 2017-08-10 NOTE — Discharge Instructions (Signed)
[facility] - [place] at [facility]  DIET:  Cardiac diet, carbohydrate consistent diet  DISCHARGE CONDITION:  Stable  ACTIVITY:  Activity as tolerated  OXYGEN:  Home Oxygen: No.   Oxygen Delivery: room air  DISCHARGE LOCATION:  SNF   ADDITIONAL DISCHARGE INSTRUCTION:   If you experience worsening of your admission symptoms, develop shortness of breath, life threatening emergency, suicidal or homicidal thoughts you must seek medical attention immediately by calling 911 or calling your MD immediately  if symptoms less severe.  You Must read complete instructions/literature along with all the possible adverse reactions/side effects for all the Medicines you take and that have been prescribed to you. Take any new Medicines after you have completely understood and accpet all the possible adverse reactions/side effects.   Please note  You were cared for by a hospitalist during your hospital stay. If you have any questions about your discharge medications or the care you received while you were in the hospital after you are discharged, you can call the unit and asked to speak with the hospitalist on call if the hospitalist that took care of you is not available. Once you are discharged, your primary care physician will handle any further medical issues. Please note that NO REFILLS for any discharge medications will be authorized once you are discharged, as it is imperative that you return to your primary care physician (or establish a relationship with a primary care physician if you do not have one) for your aftercare needs so that they can reassess your need for medications and monitor your lab values.

## 2017-08-10 NOTE — Care Management Important Message (Signed)
Important Message  Patient Details  Name: Jeffery ArgyleMonroe Ciccarelli MRN: [id number] Date of Birth: 05/17/1931   Medicare Important Message Given:  Yes  Late entry that IM was given.   Olegario MessierKathy A Janesia Joswick 08/10/2017, 11:43 AM

## 2017-08-10 NOTE — Progress Notes (Addendum)
Patient is medically stable for D/C to [facility] in [place] today. [facility] patient can come today to room 205. RN will call report and arrange EMS for transport. Clinical Child psychotherapistocial Worker (CSW) sent D/C orders to [facility] via HUB. Patient is aware of above. CSW contacted patient's brother Gabriel RungJoe and made him aware of above. CSW attempted to contact patient's daughter Osie BondLawanner however she did not answer and a voicemail could not be left. Please reconsult if future social work needs arise. CSW signing off.   Baker Hughes IncorporatedBailey Nguyen Todorov, LCSW [phone]

## 2017-08-10 NOTE — Telephone Encounter (Signed)
Coralee RudGilley, Sabrina F  P [facility] Triage        4/4 10:20  Dr. Mariah MillingGollan    TCM call. Patient currently admitted.

## 2017-08-10 NOTE — Discharge Summary (Signed)
[facility] - [place] at [facility] [name], 84 y.o., DOB 1930/06/01, MRN [id number]. Admission date: 08/06/2017 Discharge Date 08/10/2017 Primary MD Glori Luis, MD Admitting Physician Oralia Manis, MD  Admission Diagnosis  Bilateral lower leg cellulitis [L03.116, L03.115] Poor social situation [Z65.9]  Discharge Diagnosis   Principal Problem: Bilateral lower extremity cellulitis Acute on chronic diastolic CHF Moderate to severe aortic stenosis Diabetes type 2 Hypokalemia Coronary artery disease Essential hypertension Chronic venous insufficiency Hyperlipidemia      Hospital Course  Patient is a 84 year old African-American male who presented with bilateral lower extremity swelling and redness.  He was thought to have combination of cellulitis and acute diastolic CHF.  Patient was admitted and started on IV antibiotics and IV Lasix with significant improvement in his swelling and redness.  He is doing much better.  His antibiotics switched to oral antibiotics.  He will be discharged home on oral antibiotics.            Consults  None  Significant Tests:  See full reports for all details     Ct Head Wo Contrast  Result Date: 08/05/2017 CLINICAL DATA:  Headache, primarily frontal EXAM: CT HEAD WITHOUT CONTRAST TECHNIQUE: Contiguous axial images were obtained from the base of the skull through the vertex without intravenous contrast. COMPARISON:  May 26, 2014 FINDINGS: Brain: There is age related volume loss. There is no intracranial mass, hemorrhage, extra-axial fluid collection, or midline shift. There is slight small vessel disease in the centra semiovale bilaterally. Elsewhere gray-white compartments appear normal. Vascular: No evident hyperdense vessel. There is calcification in each carotid siphon region. Skull: Bony calvarium appears intact. Sinuses/Orbits: There is mucosal thickening in both sphenoid sinuses, more on the left than on the  right. There is opacification in several ethmoid air cells bilaterally. The patient has had surgery in each ethmoid air cell region. Visualized orbits appear symmetric bilaterally. Other: Visualized mastoid air cells are clear. IMPRESSION: Age related volume loss with slight periventricular small vessel disease. No acute infarct evident. No mass or hemorrhage. Foci of arterial vascular calcification noted. Postoperative change noted in the ethmoid air cells. There are areas of paranasal sinus disease, most notably in the left sphenoid sinus posteriorly. There are foci of arterial vascular calcification evident. Electronically Signed   By: Bretta Bang III M.D.   On: 08/05/2017 15:08   US Venous Img Lower Bilateral  Result Date: 08/06/2017 CLINICAL DATA:  Redness in the lower extremities.  Evaluate for DVT. EXAM: BILATERAL LOWER EXTREMITY VENOUS DOPPLER ULTRASOUND TECHNIQUE: Gray-scale sonography with graded compression, as well as color Doppler and duplex ultrasound were performed to evaluate the lower extremity deep venous systems from the level of the common femoral vein and including the common femoral, femoral, profunda femoral, popliteal and calf veins including the posterior tibial, peroneal and gastrocnemius veins when visible. The superficial great saphenous vein was also interrogated. Spectral Doppler was utilized to evaluate flow at rest and with distal augmentation maneuvers in the common femoral, femoral and popliteal veins. COMPARISON:  No comparison studies available. FINDINGS: RIGHT LOWER EXTREMITY Common Femoral Vein: No evidence of thrombus. Normal compressibility, respiratory phasicity and response to augmentation. Saphenofemoral Junction: No evidence of thrombus. Normal compressibility and flow on color Doppler imaging. Profunda Femoral Vein: No evidence of thrombus. Normal compressibility and flow on color Doppler imaging. Femoral Vein: No evidence of thrombus. Normal compressibility,  respiratory phasicity and response to augmentation. Popliteal Vein: No evidence of thrombus. Normal compressibility, respiratory phasicity and response  to augmentation. Calf Veins: No evidence of thrombus. Normal compressibility and flow on color Doppler imaging. Other Findings:  None. LEFT LOWER EXTREMITY Common Femoral Vein: No evidence of thrombus. Normal compressibility, respiratory phasicity and response to augmentation. Saphenofemoral Junction: No evidence of thrombus. Normal compressibility and flow on color Doppler imaging. Profunda Femoral Vein: No evidence of thrombus. Normal compressibility and flow on color Doppler imaging. Femoral Vein: No evidence of thrombus. Normal compressibility, respiratory phasicity and response to augmentation. Popliteal Vein: No evidence of thrombus. Normal compressibility, respiratory phasicity and response to augmentation. Calf Veins: No evidence of thrombus. Normal compressibility and flow on color Doppler imaging. Other Findings:  None. IMPRESSION: No evidence of deep venous thrombosis. Electronically Signed   By: Kennith Center M.D.   On: 08/06/2017 23:52       Today   Subjective:   [name] patient feels well denies any complaints Objective:   Blood pressure (!) 126/50, pulse 64, temperature 99.3 F (37.4 C), temperature source Oral, resp. rate 18, height 5\' 11"  (1.803 m), weight 269 lb 6.4 oz (122.2 kg), SpO2 95 %.  .  Intake/Output Summary (Last 24 hours) at 08/10/2017 0853 Last data filed at 08/10/2017 0442 Gross per 24 hour  Intake 360 ml  Output 1400 ml  Net -1040 ml    Exam VITAL SIGNS: Blood pressure (!) 126/50, pulse 64, temperature 99.3 F (37.4 C), temperature source Oral, resp. rate 18, height 5\' 11"  (1.803 m), weight 269 lb 6.4 oz (122.2 kg), SpO2 95 %.  GENERAL:  84 y.o.-year-old patient lying in the bed with no acute distress.  EYES: Pupils equal, round, reactive to light and accommodation. No scleral icterus. Extraocular muscles  intact.  HEENT: Head atraumatic, normocephalic. Oropharynx and nasopharynx clear.  NECK:  Supple, no jugular venous distention. No thyroid enlargement, no tenderness.  LUNGS: Normal breath sounds bilaterally, no wheezing, rales,rhonchi or crepitation. No use of accessory muscles of respiration.  CARDIOVASCULAR: S1, S2 normal. No murmurs, rubs, or gallops.  ABDOMEN: Soft, nontender, nondistended. Bowel sounds present. No organomegaly or mass.  EXTREMITIES: Bilateral lower extremity lymphedema nEUROLOGIC: Cranial nerves II through XII are intact. Muscle strength 5/5 in all extremities. Sensation intact. Gait not checked.  PSYCHIATRIC: The patient is alert and oriented x 3.  SKIN: No obvious rash, lesion, or ulcer.   Data Review     CBC w Diff:  Lab Results  Component Value Date   WBC 6.0 08/09/2017   HGB 11.7 (L) 08/09/2017   HGB 13.6 05/26/2014   HCT 35.4 (L) 08/09/2017   HCT 42.0 05/26/2014   PLT 138 (L) 08/09/2017   PLT 160 05/26/2014   LYMPHOPCT 18 08/06/2017   MONOPCT 8 08/06/2017   EOSPCT 2 08/06/2017   BASOPCT 1 08/06/2017   CMP:  Lab Results  Component Value Date   NA 136 08/10/2017   NA 141 05/26/2014   K 3.8 08/10/2017   K 3.6 05/26/2014   CL 100 (L) 08/10/2017   CL 106 05/26/2014   CO2 27 08/10/2017   CO2 27 05/26/2014   BUN 17 08/10/2017   BUN 16 05/26/2014   CREATININE 0.81 08/10/2017   CREATININE 0.87 05/26/2014   PROT 7.1 08/05/2017   PROT 6.8 05/26/2014   ALBUMIN 3.5 08/05/2017   ALBUMIN 3.3 (L) 05/26/2014   BILITOT 2.1 (H) 08/05/2017   BILITOT 1.2 (H) 05/26/2014   ALKPHOS 77 08/05/2017   ALKPHOS 75 05/26/2014   AST 24 08/05/2017   AST 22 05/26/2014   ALT 13 (  L) 08/05/2017   ALT 17 05/26/2014  .  Micro Results Recent Results (from the past 240 hour(s))  Culture, blood (Routine X 2) w Reflex to ID Panel     Status: None (Preliminary result)   Collection Time: 08/06/17  9:56 PM  Result Value Ref Range Status   Specimen Description BLOOD LEFT  ANTECUBITAL  Final   Special Requests   Final    BOTTLES DRAWN AEROBIC AND ANAEROBIC Blood Culture results may not be optimal due to an excessive volume of blood received in culture bottles   Culture   Final    NO GROWTH 4 DAYS Performed at [facility], [address] [place], [place] [address]    Report Status PENDING  Incomplete  Culture, blood (Routine X 2) w Reflex to ID Panel     Status: None (Preliminary result)   Collection Time: 08/06/17 10:07 PM  Result Value Ref Range Status   Specimen Description BLOOD LEFT HAND  Final   Special Requests   Final    BOTTLES DRAWN AEROBIC AND ANAEROBIC Blood Culture adequate volume   Culture   Final    NO GROWTH 4 DAYS Performed at [facility], [address] [place], [place] [address]    Report Status PENDING  Incomplete        Code Status Orders  (From admission, onward)        Start     Ordered   08/07/17 0429  Full code  Continuous     08/07/17 0428    Code Status History    Date Active Date Inactive Code Status Order ID Comments User Context   05/13/2016 0435 05/13/2016 1813 Full Code [id number]  Arnaldo Nataliamond, Michael S, MD Inpatient   06/21/2015 0533 06/21/2015 1553 Full Code [id number]  Ihor AustinPyreddy, Pavan, MD ED           Contact information for follow-up providers    Glori LuisSonnenberg, Eric G, MD Follow up in 1 week(s).   Specialty:  Family Medicine Contact information: [address] [place] [address] [phone]        Antonieta IbaGollan, Timothy J, MD .   Specialty:  Cardiology Contact information: [address] [place] [address] [phone]            Contact information for after-discharge care    Destination    [facility] .   Service:  Skilled Nursing Contact information: [address] [place] [address] [phone]                  Discharge Medications   Allergies as of 08/10/2017      Reactions    Aspirin Rash, Other (See Comments)   Reaction: Trouble breathing      Medication List    TAKE these medications   amoxicillin-clavulanate 875-125 MG tablet Commonly known as:  AUGMENTIN Take 1 tablet by mouth every 12 (twelve) hours for 5 days.   atorvastatin 80 MG tablet Commonly known as:  LIPITOR Take 1 tablet (80 mg total) by mouth daily.   clopidogrel 75 MG tablet Commonly known as:  PLAVIX Take 1 tablet (75 mg total) by mouth daily.   docusate sodium 100 MG capsule Commonly known as:  COLACE Take 2 capsules (200 mg total) by mouth 2 (two) times daily.   ferrous sulfate 325 (65 FE) MG EC tablet Take 1 tablet (325 mg total) by mouth 2 (two) times daily.   furosemide 40  MG tablet Commonly known as:  LASIX Take 1 tablet (40 mg total) by mouth 2 (two) times daily. What changed:    medication strength  how much to take  when to take this   hydrocerin Crea Apply 1 application topically 2 (two) times daily. Apply to bilateral LEs and feet twice daily after cleansing with house skin cleanser and gently patting dry. No not apply between toes.   lisinopril 10 MG tablet Commonly known as:  PRINIVIL,ZESTRIL Take 1 tablet (10 mg total) by mouth daily.   metoprolol succinate 25 MG 24 hr tablet Commonly known as:  TOPROL-XL Take 1 tablet (25 mg total) by mouth daily.   polyethylene glycol packet Commonly known as:  MIRALAX / GLYCOLAX Take 17 g by mouth daily.   potassium chloride 10 MEQ tablet Commonly known as:  K-DUR Take 1 tablet (10 mEq total) by mouth daily.          Total Time in preparing paper work, data evaluation and todays exam - 35 minutes  Auburn Bilberry M.D on 08/10/2017 at 8:53 AM [facility]   Office  [phone]

## 2017-08-11 LAB — CULTURE, BLOOD (ROUTINE X 2)
Culture: NO GROWTH
Culture: NO GROWTH
Special Requests: ADEQUATE

## 2017-08-14 DIAGNOSIS — M25562 Pain in left knee: Secondary | ICD-10-CM | POA: Diagnosis not present

## 2017-08-14 DIAGNOSIS — I872 Venous insufficiency (chronic) (peripheral): Secondary | ICD-10-CM | POA: Diagnosis not present

## 2017-08-14 DIAGNOSIS — E119 Type 2 diabetes mellitus without complications: Secondary | ICD-10-CM | POA: Diagnosis not present

## 2017-08-14 DIAGNOSIS — I10 Essential (primary) hypertension: Secondary | ICD-10-CM | POA: Diagnosis not present

## 2017-08-14 DIAGNOSIS — I5033 Acute on chronic diastolic (congestive) heart failure: Secondary | ICD-10-CM | POA: Diagnosis not present

## 2017-08-14 DIAGNOSIS — I2510 Atherosclerotic heart disease of native coronary artery without angina pectoris: Secondary | ICD-10-CM | POA: Diagnosis not present

## 2017-08-15 ENCOUNTER — Other Ambulatory Visit: Payer: Self-pay | Admitting: Internal Medicine

## 2017-08-15 DIAGNOSIS — M25562 Pain in left knee: Secondary | ICD-10-CM

## 2017-08-17 ENCOUNTER — Ambulatory Visit: Payer: Medicare Other | Admitting: Gastroenterology

## 2017-08-17 NOTE — Progress Notes (Signed)
Cardiology Office Note  Date:  08/18/2017   ID:  [name], DOB [birth year], MRN [id number]  PCP:  Glori Luis, MD   Chief Complaint  Patient presents with  . other    Follow up from [facility]; CHF & cellulitis. Pt. c/o chest pain at times and LE edema.     HPI:  [name] is a very pleasant 84 year old gentleman with  Moderate aortic valve stenosis, 12/2016  obesity,  diabetes,  smoking history for 30 years,   mild coronary artery disease by cardiac catheterization in 2004,  normal ejection fraction with normal wall motion,  minimal carotid arterial disease  who presents for routine follow-up of his aortic valve stenosis, follow-up after recent hospitalization  Recent hospitalization records reviewed  hospital March 2019 for bilateral lower extremity cellulitis Acute on chronic diastolic CHF Treated with antibiotics and Lasix  Now at  [facility] nursing facility/rehab, feels he is getting stronger Still with significant lower extremity edema On Lasix 40 twice daily potassium 10 daily  At home he has lymphedema compression pumps, has not had a chance to use these Right leg is sore, particularly left knee Scheduled for cortisone shot April 11  Chronic shortness of breath, leg weakness Right greater than left chronic lower extremity swelling  Previously declined wearing compression hose on a regular basis  EKG personally reviewed by myself on todays visit Shows normal sinus rhythm with rate 75 bpm right bundle branch block  Other past medical history reviewed seen in the ER 12/2015: knee pain, Set up appt ortho wife is in a nursing home, had a bilateral stroke.  Lower extremity venous Doppler reviewed with him from 05/08/2015 showing no DVT on the right  Cardiac catheterization July 2004 showed 20% disease in the LAD, RCA and left circumflex Stress test in 2012 shows no significant ischemia, attenuation artifact in the septal wall  Vitamin D 13, hematocrit 42,  creatinine 0.77, total cholesterol 157, HDL 54, LDL 89  PMH:   has a past medical history of Aortic atherosclerosis (HCC) (05/2013), Arrhythmia, Asthma, Colon polyp, Coronary artery disease, GERD (gastroesophageal reflux disease), Heart murmur, Hiatal hernia, Hyperlipidemia, Hypertension, Moderate aortic stenosis, Obesity, Onychomycosis, and Osteoarthritis.  PSH:    Past Surgical History:  Procedure Laterality Date  . CARDIAC CATHETERIZATION  11/2002  . CARDIAC CATHETERIZATION  05/21/2013   showing 30% oLM, 30% D1, 20% mLCx, 30% pRCA; EF 60%, severe AS (mean gradient 28 mmHg, peak 26, area 0.9)  . CHOLECYSTECTOMY  2001  . TRANSESOPHAGEAL ECHOCARDIOGRAM  05/22/2013   Preserved EF, moderate AS (valve area by planimetry between 1.0-1.26 cm sq), moderate aortic arch and descending aortic atherosclerosis.   . TRANSTHORACIC ECHOCARDIOGRAM  05/19/2013   EF 60-65%, impaired diastolic function, mild LA dilatation, mild MR/AI/TR, mod AS, high normal RVSP (30.4 mmHg)    Current Outpatient Medications  Medication Sig Dispense Refill  . atorvastatin (LIPITOR) 80 MG tablet Take 1 tablet (80 mg total) by mouth daily. 30 tablet 5  . clopidogrel (PLAVIX) 75 MG tablet Take 1 tablet (75 mg total) by mouth daily. 30 tablet 2  . docusate sodium (COLACE) 100 MG capsule Take 2 capsules (200 mg total) by mouth 2 (two) times daily. 10 capsule 0  . ferrous sulfate 325 (65 FE) MG EC tablet Take 1 tablet (325 mg total) by mouth 2 (two) times daily. 60 tablet 2  . furosemide (LASIX) 40 MG tablet Take 1 tablet (40 mg total) by mouth 2 (two) times daily. 30 tablet   .  hydrocerin (EUCERIN) CREA Apply 1 application topically 2 (two) times daily. Apply to bilateral LEs and feet twice daily after cleansing with house skin cleanser and gently patting dry. No not apply between toes.  0  . lisinopril (PRINIVIL,ZESTRIL) 10 MG tablet Take 1 tablet (10 mg total) by mouth daily. 30 tablet 5  . metoprolol succinate (TOPROL-XL) 25 MG 24 hr  tablet Take 1 tablet (25 mg total) by mouth daily. 30 tablet 5  . polyethylene glycol (MIRALAX / GLYCOLAX) packet Take 17 g by mouth daily. 14 each 0  . potassium chloride (K-DUR) 10 MEQ tablet Take 1 tablet (10 mEq total) by mouth daily. 30 tablet 2   No current facility-administered medications for this visit.      Allergies:   Aspirin   Social History:  The patient  reports that he quit smoking about 36 years ago. His smoking use included cigarettes. He has a 30.00 pack-year smoking history. He has never used smokeless tobacco. He reports that he does not drink alcohol or use drugs.   Family History:   family history includes Heart disease in his mother; Heart disease (age of onset: 74) in his father; Stroke in his brother.    Review of Systems: Review of Systems  Constitutional: Positive for malaise/fatigue.  Respiratory: Negative.   Cardiovascular: Positive for leg swelling.  Gastrointestinal: Negative.   Musculoskeletal: Positive for joint pain.       Leg weakness  Neurological: Negative.   Psychiatric/Behavioral: Negative.   All other systems reviewed and are negative.    PHYSICAL EXAM: VS:  BP (!) 100/40 (BP Location: Left Arm, Patient Position: Sitting, Cuff Size: Normal)   Pulse 75   Ht 5\' 11"  (1.803 m)   Wt 249 lb 12 oz (113.3 kg)   BMI 34.83 kg/m  , BMI Body mass index is 34.83 kg/m. Constitutional:  oriented to person, place, and time. No distress. Presents in a wheelchair HENT:  Head: Normocephalic and atraumatic.  Eyes:  no discharge. No scleral icterus.  Neck: Normal range of motion. Neck supple. No JVD present.  Cardiovascular: Normal rate, regular rhythm, normal heart sounds and intact distal pulses. Exam reveals no gallop and no friction rub.  Lymphedema right lower extremity signs of chronic venous skin changes, mild on the left No murmur heard. Pulmonary/Chest: Effort normal and breath sounds normal. No stridor. No respiratory distress.  no wheezes.   no rales.  no tenderness.  Abdominal: Soft.  no distension.  no tenderness.  Musculoskeletal: Normal range of motion.  no  tenderness or deformity.  Neurological:  normal muscle tone. Coordination normal. No atrophy Skin: Skin is warm and dry. No rash noted. not diaphoretic.  Psychiatric:  normal mood and affect. behavior is normal. Thought content normal.    Recent Labs: 10/04/2016: Pro B Natriuretic peptide (BNP) 105.0 03/02/2017: TSH 1.45 08/05/2017: ALT 13 08/06/2017: B Natriuretic Peptide 163.0 08/09/2017: Hemoglobin 11.7; Platelets 138 08/10/2017: BUN 17; Creatinine, Ser 0.81; Potassium 3.8; Sodium 136    Lipid Panel Lab Results  Component Value Date   CHOL 118 10/04/2016   HDL 42.80 10/04/2016   LDLCALC 64 10/04/2016   TRIG 57.0 10/04/2016      Wt Readings from Last 3 Encounters:  08/18/17 249 lb 12 oz (113.3 kg)  08/07/17 269 lb 6.4 oz (122.2 kg)  08/05/17 220 lb (99.8 kg)       ASSESSMENT AND PLAN:  SOB (shortness of breath) - Plan: EKG 12-Lead, ECHOCARDIOGRAM COMPLETE Chronic shortness of breath likely  secondary to morbid obesity, deconditioning. Exacerbated by aortic valve stenosis and chronic diastolic CHF Referral to CHF clinic Would benefit from lab work in several weeks time, BMP  Aortic valve stenosis, etiology of cardiac valve disease unspecified -  At least moderate aortic valve stenosis, nonsurgical at this time  Morbid obesity (HCC) Weight appears to be trending up We will continue aggressive diuretic regiment  Type 2 diabetes mellitus with other circulatory complication, unspecified long term insulin use status (HCC) We have encouraged careful diet management in an effort to lose weight. Unable to exercise  Coronary artery disease, non-occlusive Currently with no symptoms of angina. No further workup at this time. Continue current medication regimen.   no recent lab work available, will defer to primary care  Mixed hyperlipidemia Cholesterol  is at goal on the current lipid regimen. No changes to the medications were made.  Stable  Lymphedema  recommended that his friend get his compression pumps from his house and bring them to the rehab facility so he can use this for 1 hour twice a [place]  Followed by vein and vascular   Total encounter time more than 45 minutes  Greater than 50% was spent in counseling and coordination of care with the patient   Disposition:   F /U  6 months    Orders Placed This Encounter  Procedures  . EKG 12-Lead     Signed, Dossie Arbourim Mahamed Zalewski, M.D., Ph.D. 08/18/2017  [facility], [place] [phone]

## 2017-08-18 ENCOUNTER — Telehealth: Payer: Self-pay | Admitting: Cardiovascular Disease

## 2017-08-18 ENCOUNTER — Encounter: Payer: Self-pay | Admitting: Cardiovascular Disease

## 2017-08-18 ENCOUNTER — Ambulatory Visit (INDEPENDENT_AMBULATORY_CARE_PROVIDER_SITE_OTHER): Payer: Medicare Other | Admitting: Cardiovascular Disease

## 2017-08-18 DIAGNOSIS — I10 Essential (primary) hypertension: Secondary | ICD-10-CM | POA: Diagnosis not present

## 2017-08-18 DIAGNOSIS — R0602 Shortness of breath: Secondary | ICD-10-CM | POA: Diagnosis not present

## 2017-08-18 DIAGNOSIS — I350 Nonrheumatic aortic (valve) stenosis: Secondary | ICD-10-CM | POA: Diagnosis not present

## 2017-08-18 DIAGNOSIS — E782 Mixed hyperlipidemia: Secondary | ICD-10-CM | POA: Diagnosis not present

## 2017-08-18 DIAGNOSIS — I890 Lymphedema, not elsewhere classified: Secondary | ICD-10-CM | POA: Diagnosis not present

## 2017-08-18 NOTE — Telephone Encounter (Signed)
[facility] calling to discuss [facility] with nurse [facility]

## 2017-08-18 NOTE — Patient Instructions (Addendum)
Medication Instructions:   No medication changes made  Labwork:  Labs to be done at CHF clinic appointment.  Testing/Procedures:  No further testing at this time   Follow-Up: It was a pleasure seeing you in the office today. Please call us if you have new issues that need to be addressed before your next appt.  [phone]  Your physician wants you to follow-up in: 12 months.  Patient needs appointment with CHF clinic in 3 weeks You will receive a reminder letter in the mail two months in advance. If you don't receive a letter, please call our office to schedule the follow-up appointment.  If you need a refill on your cardiac medications before your next appointment, please call your pharmacy.  For educational health videos Log in to : [email or web address] Or : [email or web address], password : [email or web address]

## 2017-08-18 NOTE — Telephone Encounter (Signed)
Spoke with Tresa EndoKelly and she states that patients home was infested with bed bugs and that he was treated for this and they have been avoiding anything from home due to that. Advised that he really needs to use those and if possible they may need to inspect and disinfect them so he can use them during his stay with them. She verbalized understanding with no further questions at this time.

## 2017-08-22 ENCOUNTER — Inpatient Hospital Stay: Payer: Medicare Other | Admitting: Family Medicine

## 2017-08-25 ENCOUNTER — Ambulatory Visit
Admission: RE | Admit: 2017-08-25 | Discharge: 2017-08-25 | Disposition: A | Discharge status: Home / Self Care | Payer: Medicare Other | Source: Ambulatory Visit | Attending: Internal Medicine | Admitting: Internal Medicine

## 2017-08-25 DIAGNOSIS — M25562 Pain in left knee: Secondary | ICD-10-CM

## 2017-08-25 DIAGNOSIS — M1612 Unilateral primary osteoarthritis, left hip: Secondary | ICD-10-CM | POA: Diagnosis not present

## 2017-08-25 MED ORDER — METHYLPREDNISOLONE ACETATE 40 MG/ML INJ SUSP (RADIOLOG
120.0000 mg | Freq: Once | INTRAMUSCULAR | Status: AC
  Administered 2017-08-25: 120 mg via INTRA_ARTICULAR

## 2017-08-29 DIAGNOSIS — E785 Hyperlipidemia, unspecified: Secondary | ICD-10-CM | POA: Diagnosis not present

## 2017-08-29 DIAGNOSIS — I872 Venous insufficiency (chronic) (peripheral): Secondary | ICD-10-CM | POA: Diagnosis not present

## 2017-08-29 DIAGNOSIS — H9193 Unspecified hearing loss, bilateral: Secondary | ICD-10-CM | POA: Diagnosis not present

## 2017-08-29 DIAGNOSIS — M25562 Pain in left knee: Secondary | ICD-10-CM | POA: Diagnosis not present

## 2017-08-29 DIAGNOSIS — Z9181 History of falling: Secondary | ICD-10-CM | POA: Diagnosis not present

## 2017-08-29 DIAGNOSIS — E119 Type 2 diabetes mellitus without complications: Secondary | ICD-10-CM | POA: Diagnosis not present

## 2017-08-29 DIAGNOSIS — I2510 Atherosclerotic heart disease of native coronary artery without angina pectoris: Secondary | ICD-10-CM | POA: Diagnosis not present

## 2017-08-29 DIAGNOSIS — L03115 Cellulitis of right lower limb: Secondary | ICD-10-CM | POA: Diagnosis not present

## 2017-08-29 DIAGNOSIS — I5032 Chronic diastolic (congestive) heart failure: Secondary | ICD-10-CM | POA: Diagnosis not present

## 2017-08-29 DIAGNOSIS — E46 Unspecified protein-calorie malnutrition: Secondary | ICD-10-CM | POA: Diagnosis not present

## 2017-08-29 DIAGNOSIS — D649 Anemia, unspecified: Secondary | ICD-10-CM | POA: Diagnosis not present

## 2017-08-29 DIAGNOSIS — Z7902 Long term (current) use of antithrombotics/antiplatelets: Secondary | ICD-10-CM | POA: Diagnosis not present

## 2017-08-29 DIAGNOSIS — M1990 Unspecified osteoarthritis, unspecified site: Secondary | ICD-10-CM | POA: Diagnosis not present

## 2017-08-29 DIAGNOSIS — L03116 Cellulitis of left lower limb: Secondary | ICD-10-CM | POA: Diagnosis not present

## 2017-08-29 DIAGNOSIS — I110 Hypertensive heart disease with heart failure: Secondary | ICD-10-CM | POA: Diagnosis not present

## 2017-08-29 DIAGNOSIS — I350 Nonrheumatic aortic (valve) stenosis: Secondary | ICD-10-CM | POA: Diagnosis not present

## 2017-08-31 DIAGNOSIS — L03115 Cellulitis of right lower limb: Secondary | ICD-10-CM | POA: Diagnosis not present

## 2017-08-31 DIAGNOSIS — E119 Type 2 diabetes mellitus without complications: Secondary | ICD-10-CM | POA: Diagnosis not present

## 2017-08-31 DIAGNOSIS — L03116 Cellulitis of left lower limb: Secondary | ICD-10-CM | POA: Diagnosis not present

## 2017-08-31 DIAGNOSIS — M25562 Pain in left knee: Secondary | ICD-10-CM | POA: Diagnosis not present

## 2017-08-31 DIAGNOSIS — I110 Hypertensive heart disease with heart failure: Secondary | ICD-10-CM | POA: Diagnosis not present

## 2017-08-31 DIAGNOSIS — I5032 Chronic diastolic (congestive) heart failure: Secondary | ICD-10-CM | POA: Diagnosis not present

## 2017-09-02 DIAGNOSIS — M25562 Pain in left knee: Secondary | ICD-10-CM | POA: Diagnosis not present

## 2017-09-02 DIAGNOSIS — E119 Type 2 diabetes mellitus without complications: Secondary | ICD-10-CM | POA: Diagnosis not present

## 2017-09-02 DIAGNOSIS — I5032 Chronic diastolic (congestive) heart failure: Secondary | ICD-10-CM | POA: Diagnosis not present

## 2017-09-02 DIAGNOSIS — I110 Hypertensive heart disease with heart failure: Secondary | ICD-10-CM | POA: Diagnosis not present

## 2017-09-02 DIAGNOSIS — L03115 Cellulitis of right lower limb: Secondary | ICD-10-CM | POA: Diagnosis not present

## 2017-09-02 DIAGNOSIS — L03116 Cellulitis of left lower limb: Secondary | ICD-10-CM | POA: Diagnosis not present

## 2017-09-05 ENCOUNTER — Telehealth: Payer: Self-pay | Admitting: Family Medicine

## 2017-09-05 NOTE — Telephone Encounter (Signed)
Copied from CRM [id number]. Topic: Quick Communication - See Telephone Encounter >> Sep 05, 2017  4:21 PM Diana EvesHoyt, Maryann B wrote: CRM for notification. See Telephone encounter for: 09/05/17.  Minda with [facility] calling in requesting verbal orders for speech therapy 2 x 3 weeks for cognitive communication deficit. CB# [phone].

## 2017-09-05 NOTE — Telephone Encounter (Signed)
Please advise 

## 2017-09-05 NOTE — Telephone Encounter (Signed)
Verbal orders can be given. 

## 2017-09-06 ENCOUNTER — Ambulatory Visit (INDEPENDENT_AMBULATORY_CARE_PROVIDER_SITE_OTHER): Payer: Medicare Other | Admitting: Family Medicine

## 2017-09-06 ENCOUNTER — Encounter: Payer: Self-pay | Admitting: Family Medicine

## 2017-09-06 ENCOUNTER — Telehealth: Payer: Self-pay | Admitting: Family Medicine

## 2017-09-06 ENCOUNTER — Other Ambulatory Visit: Payer: Self-pay

## 2017-09-06 VITALS — BP 118/50 | HR 63 | Temp 98.4°F | Ht 71.0 in | Wt 262.2 lb

## 2017-09-06 DIAGNOSIS — I872 Venous insufficiency (chronic) (peripheral): Secondary | ICD-10-CM | POA: Diagnosis not present

## 2017-09-06 DIAGNOSIS — D509 Iron deficiency anemia, unspecified: Secondary | ICD-10-CM

## 2017-09-06 DIAGNOSIS — I10 Essential (primary) hypertension: Secondary | ICD-10-CM | POA: Diagnosis not present

## 2017-09-06 DIAGNOSIS — I5032 Chronic diastolic (congestive) heart failure: Secondary | ICD-10-CM | POA: Diagnosis not present

## 2017-09-06 DIAGNOSIS — L03116 Cellulitis of left lower limb: Secondary | ICD-10-CM | POA: Diagnosis not present

## 2017-09-06 DIAGNOSIS — L03115 Cellulitis of right lower limb: Secondary | ICD-10-CM

## 2017-09-06 DIAGNOSIS — Z659 Problem related to unspecified psychosocial circumstances: Secondary | ICD-10-CM | POA: Diagnosis not present

## 2017-09-06 LAB — BASIC METABOLIC PANEL
BUN: 14 mg/dL (ref 6–23)
CHLORIDE: 105 meq/L (ref 96–112)
CO2: 32 meq/L (ref 19–32)
Calcium: 9.2 mg/dL (ref 8.4–10.5)
Creatinine, Ser: 0.74 mg/dL (ref 0.40–1.50)
GFR: 129.36 mL/min (ref >60.00)
Glucose, Bld: 80 mg/dL (ref 70–99)
Potassium: 3.9 mEq/L (ref 3.5–5.1)
SODIUM: 139 meq/L (ref 135–145)

## 2017-09-06 LAB — CBC
HEMATOCRIT: 38.6 % — AB (ref 39.0–52.0)
HEMOGLOBIN: 13 g/dL (ref 13.0–17.0)
MCHC: 33.7 g/dL (ref 30.0–36.0)
MCV: 80.2 fl (ref 78.0–100.0)
PLATELETS: 129 10*3/uL — AB (ref 150.0–400.0)
RBC: 4.81 Mil/uL (ref 4.22–5.81)
RDW: 15.3 % (ref 11.5–15.5)
WBC: 7.3 10*3/uL (ref 4.0–10.5)

## 2017-09-06 MED ORDER — LISINOPRIL 5 MG PO TABS: 5.0000 mg | ORAL_TABLET | Freq: Every day | ORAL | 1 refills | Status: DC

## 2017-09-06 NOTE — Assessment & Plan Note (Signed)
Needs recheck of CBC.

## 2017-09-06 NOTE — Assessment & Plan Note (Signed)
Symptoms have resolved.  His caretaker will check when they get home to ensure that he completed the Augmentin though I suspect he finished this while at rehab.

## 2017-09-06 NOTE — Assessment & Plan Note (Signed)
BP low on the diastolic side.  He notes no symptoms related to this.  We will decrease his lisinopril dose.  He will follow-up in 1-2 weeks for BP check.

## 2017-09-06 NOTE — Assessment & Plan Note (Signed)
Continues to have issues with chronic venous insufficiency and swelling.  I encouraged use of his lymphedema pumps twice daily as advised by his cardiologist.

## 2017-09-06 NOTE — Telephone Encounter (Signed)
Left message with verbal order. 

## 2017-09-06 NOTE — Patient Instructions (Signed)
Nice to see you. Please check at home to make sure he finished the Augmentin which is the antibiotic for the skin infection that he had. We will decrease your lisinopril to 5 mg daily.  We will have you return in 1-2 weeks for recheck of your blood pressure. Please keep the appointment with the congestive heart failure clinic on Thursday. If you develop lightheadedness, increased swelling, or any new symptoms please seek medical attention.

## 2017-09-06 NOTE — Assessment & Plan Note (Signed)
Patient has recovered well following acute exacerbation of CHF.  He continues on Lasix.  We will check labs today.  He will see the CHF clinic in 2 days.

## 2017-09-06 NOTE — Assessment & Plan Note (Signed)
Appears to have improved as he is no longer living at his prior residence as it is getting cleaned.  His caretaker is helping the patient.  He has home health coming in as well.  We will continue to monitor the situation.

## 2017-09-06 NOTE — Progress Notes (Signed)
Marikay Alar, MD Phone: [phone]  [name] is a 84 y.o. male who presents today for hospital follow-up.  Patient was hospitalized for lower extremity cellulitis as well as diastolic CHF exacerbation.  He was started on IV antibiotics as well as IV Lasix.  He was discharged to rehab and had an oral course of antibiotics.  He has continued on Lasix twice daily.  He is urinating well.  Continues on metoprolol.  He reports his swelling is improved though he remains swollen in his bilateral lower extremities which is chronic.  He does not note any lightheadedness or low blood pressures.  No chest pain or shortness of breath.  He has not been using his lymphedema pumps.  He is no longer in his prior home as there was concern for his home situation as he was found to have bedbugs.  His caretaker is not letting him back into his prior house until it is clean.  He does have home health nursing coming to his house.  He has an appointment with the heart failure clinic on Thursday.  Social History   Tobacco Use  Smoking Status Former Smoker  . Packs/Secrist: 1.50  . Years: 20.00  . Pack years: 30.00  . Types: Cigarettes  . Last attempt to quit: 02/22/1981  . Years since quitting: 36.5  Smokeless Tobacco Never Used     ROS see history of present illness  Objective  Physical Exam Vitals:   09/06/17 1111 09/06/17 1150  BP: (!) 110/48 (!) 118/50  Pulse: 63   Temp: 98.4 F (36.9 C)   SpO2: 97%     BP Readings from Last 3 Encounters:  09/06/17 (!) 118/50  08/18/17 (!) 100/40  08/10/17 (!) 139/59   Wt Readings from Last 3 Encounters:  09/06/17 262 lb 3.2 oz (118.9 kg)  08/18/17 249 lb 12 oz (113.3 kg)  08/07/17 269 lb 6.4 oz (122.2 kg)    Physical Exam  Constitutional: No distress.  Cardiovascular: Normal rate, regular rhythm and normal heart sounds.  Pulmonary/Chest: Effort normal and breath sounds normal.  Musculoskeletal:  Bilateral lower extremity edema right greater than  left with lichenification of the skin over the right lower leg greater than the left lower leg, no signs of infection, no tenderness  Neurological: He is alert.  Skin: Skin is warm and dry. He is not diaphoretic.     Assessment/Plan: Please see individual problem list.  Chronic diastolic heart failure (HCC) Patient has recovered well following acute exacerbation of CHF.  He continues on Lasix.  We will check labs today.  He will see the CHF clinic in 2 days.  Chronic venous insufficiency Continues to have issues with chronic venous insufficiency and swelling.  I encouraged use of his lymphedema pumps twice daily as advised by his cardiologist.  Iron deficiency anemia Needs recheck of CBC.  Bilateral lower leg cellulitis Symptoms have resolved.  His caretaker will check when they get home to ensure that he completed the Augmentin though I suspect he finished this while at rehab.  Poor social situation Appears to have improved as he is no longer living at his prior residence as it is getting cleaned.  His caretaker is helping the patient.  He has home health coming in as well.  We will continue to monitor the situation.  Hypertension BP low on the diastolic side.  He notes no symptoms related to this.  We will decrease his lisinopril dose.  He will follow-up in 1-2 weeks for BP  check.   Orders Placed This Encounter  Procedures  . Basic Metabolic Panel (BMET)  . CBC    Meds ordered this encounter  Medications  . lisinopril (PRINIVIL,ZESTRIL) 5 MG tablet    Sig: Take 1 tablet (5 mg total) by mouth daily.    Dispense:  90 tablet    Refill:  1     Marikay AlarEric Oluwaseyi Raffel, MD [facility] [place] [facility]

## 2017-09-06 NOTE — Telephone Encounter (Signed)
Copied from CRM [id number]. Topic: Quick Communication - See Telephone Encounter >> Sep 06, 2017  4:51 PM Lorrine KinMcGee, Saryiah Bencosme B, NT wrote: CRM for notification. See Telephone encounter for: 09/06/17. Enrique SackKendra, PT with [facility], needing verbal orders. She would like to do PT with patient 2x a week for 3 weeks to work on balance. States the patient is currently staying with his sister until he can be on his own. States he has some bilateral lower cellullitis.  CB#: [phone]

## 2017-09-07 ENCOUNTER — Other Ambulatory Visit: Payer: Self-pay | Admitting: Family Medicine

## 2017-09-07 DIAGNOSIS — D696 Thrombocytopenia, unspecified: Secondary | ICD-10-CM

## 2017-09-07 NOTE — Telephone Encounter (Signed)
Please advise 

## 2017-09-07 NOTE — Telephone Encounter (Signed)
It is okay to give a verbal order.  I saw him yesterday and he did not have any signs of cellulitis.  He has chronic skin changes in his bilateral lower extremities.  Please find out what they mean by he states he has some bilateral cellulitis.  Thanks.

## 2017-09-07 NOTE — Telephone Encounter (Signed)
She just wanted to let you know he was admitted to the hospital due to the cellulitis, I have informed her that we have already seen him for this in the office. She would like to see patient for the weakness in his legs. Verbal orders given.

## 2017-09-07 NOTE — Telephone Encounter (Signed)
Ok to give verbal 

## 2017-09-07 NOTE — Telephone Encounter (Signed)
Left message to return call, ok for pec to speak to [facility] about message below

## 2017-09-08 ENCOUNTER — Telehealth: Payer: Self-pay | Admitting: Family Medicine

## 2017-09-08 ENCOUNTER — Ambulatory Visit: Payer: Medicare Other | Admitting: Family

## 2017-09-08 NOTE — Telephone Encounter (Signed)
Copied from CRM [id number]. Topic: Quick Communication - See Telephone Encounter >> Sep 08, 2017  4:19 PM Diana EvesHoyt, Maryann B wrote: CRM for notification. See Telephone encounter for: 09/08/17.  Tameka RN with [facility] would like Dr. Kermit BaloSonnenbergs opinion on how often to go see the patient just to monitor. She is thinking 1x for 3 or 4 weeks then reassess. And she would also like a Child psychotherapistsocial worker consult. CB# [phone].

## 2017-09-08 NOTE — Telephone Encounter (Signed)
I think 1-2x/week would be good. You can give a verbal order for a Child psychotherapistsocial worker. Thanks.

## 2017-09-09 DIAGNOSIS — M25562 Pain in left knee: Secondary | ICD-10-CM | POA: Diagnosis not present

## 2017-09-09 DIAGNOSIS — L03116 Cellulitis of left lower limb: Secondary | ICD-10-CM | POA: Diagnosis not present

## 2017-09-09 DIAGNOSIS — I5032 Chronic diastolic (congestive) heart failure: Secondary | ICD-10-CM | POA: Diagnosis not present

## 2017-09-09 DIAGNOSIS — I110 Hypertensive heart disease with heart failure: Secondary | ICD-10-CM | POA: Diagnosis not present

## 2017-09-09 DIAGNOSIS — L03115 Cellulitis of right lower limb: Secondary | ICD-10-CM | POA: Diagnosis not present

## 2017-09-09 DIAGNOSIS — E119 Type 2 diabetes mellitus without complications: Secondary | ICD-10-CM | POA: Diagnosis not present

## 2017-09-09 NOTE — Telephone Encounter (Signed)
Tried to call client received busy signal.

## 2017-09-12 ENCOUNTER — Telehealth: Payer: Self-pay | Admitting: Family Medicine

## 2017-09-12 NOTE — Telephone Encounter (Signed)
[name] from [facility] notified .

## 2017-09-12 NOTE — Telephone Encounter (Signed)
Tanika from [facility] notified . See previous phone note.

## 2017-09-12 NOTE — Telephone Encounter (Signed)
Copied from CRM [id number]. Topic: Quick Communication - See Telephone Encounter >> Sep 12, 2017 10:21 AM Windy Kalata, NT wrote: CRM for notification. See Telephone encounter for: 09/12/17.  Cathlean Cower is a Nurse, learning disability from [facility], she is requesting verbal orders to see the patient 1x a week through his certification. Also is requesting a Child psychotherapist consult due to patient wanting to go to a assisted living home.   [phone]

## 2017-09-13 DIAGNOSIS — L03115 Cellulitis of right lower limb: Secondary | ICD-10-CM | POA: Diagnosis not present

## 2017-09-13 DIAGNOSIS — M25562 Pain in left knee: Secondary | ICD-10-CM | POA: Diagnosis not present

## 2017-09-13 DIAGNOSIS — I5032 Chronic diastolic (congestive) heart failure: Secondary | ICD-10-CM | POA: Diagnosis not present

## 2017-09-13 DIAGNOSIS — I110 Hypertensive heart disease with heart failure: Secondary | ICD-10-CM | POA: Diagnosis not present

## 2017-09-13 DIAGNOSIS — L03116 Cellulitis of left lower limb: Secondary | ICD-10-CM | POA: Diagnosis not present

## 2017-09-13 DIAGNOSIS — E119 Type 2 diabetes mellitus without complications: Secondary | ICD-10-CM | POA: Diagnosis not present

## 2017-09-13 NOTE — Progress Notes (Signed)
Patient ID: [name], male    DOB: Oct 25, 1930, 84 y.o.   MRN: [id number]  HPI  [name] is a 84 y/o male with a history of asthma, CAD, hyperlipidemia, HTN, GERD, lymphedema, remote tobacco use and chronic heart failure.   Echo report from 11/10/16 reviewed and showed an EF of 65-70% along with moderate Jeffery and a PA pressure of 25-30 mm Hg.   Admitted 08/06/17 due to HF exacerbation along with cellulitis. Initially given IV antibiotics and lasix and then transitioned to oral medications. Cardiology and wound consults obtained. Discharged after 4 days. Was in the ED 08/05/17 due to sinusitis where he was evaluated and released.   He presents today for his initial visit with a chief complaint of minimal fatigue upon moderate exertion. He says this has been chronic in nature having been present for several months. He has associated hearing loss and lower extremity edema. He denies any difficulty sleeping, abdominal distention, palpitations, chest pain, shortness of breath, cough or dizziness. Hasn't been weighing himself but does have access to scales. Hasn't been using his compression pumps.   Past Medical History:  Diagnosis Date  . Aortic atherosclerosis (HCC) 05/2013   Per TEE  . Arrhythmia   . Asthma   . CHF (congestive heart failure) (HCC)   . Colon polyp   . Coronary artery disease    a. Nonobstructive by 2004 cath b. Nonobstructive by 2015 cath in setting of NSTEMI  . GERD (gastroesophageal reflux disease)   . Heart murmur   . Hiatal hernia   . Hyperlipidemia   . Hypertension   . Lymphedema   . Moderate aortic stenosis    a. echo 2013: EF 55%, nl wall motion, mild DD, mild LVH, trace Jeffery, moderate AS with peak gradient of 55 mm Hg; b. TEE 05/2013: EF EF 60-65%, borderline LVH, mild Jeffery/AI, mod AS with valve area 1.0-1.26; c. echo 10/2014: 55-60%, no RWMA, GR1DD, mod AS w/ mean gradient of 28 mm Hg     . Obesity   . Onychomycosis   . Osteoarthritis    Past Surgical History:  Procedure  Laterality Date  . CARDIAC CATHETERIZATION  11/2002  . CARDIAC CATHETERIZATION  05/21/2013   showing 30% oLM, 30% D1, 20% mLCx, 30% pRCA; EF 60%, severe AS (mean gradient 28 mmHg, peak 26, area 0.9)  . CHOLECYSTECTOMY  2001  . TRANSESOPHAGEAL ECHOCARDIOGRAM  05/22/2013   Preserved EF, moderate AS (valve area by planimetry between 1.0-1.26 cm sq), moderate aortic arch and descending aortic atherosclerosis.   . TRANSTHORACIC ECHOCARDIOGRAM  05/19/2013   EF 60-65%, impaired diastolic function, mild LA dilatation, mild Jeffery/AI/TR, mod AS, high normal RVSP (30.4 mmHg)   Family History  Problem Relation Age of Onset  . Heart disease Mother   . Heart disease Father 21  . Stroke Brother    Social History   Tobacco Use  . Smoking status: Former Smoker    Packs/Lacuesta: 1.50    Years: 20.00    Pack years: 30.00    Types: Cigarettes    Last attempt to quit: 02/22/1981    Years since quitting: 36.5  . Smokeless tobacco: Never Used  Substance Use Topics  . Alcohol use: No   Allergies  Allergen Reactions  . Aspirin Rash and Other (See Comments)    Reaction: Trouble breathing    Prior to Admission medications   Medication Sig Start Date End Date Taking? Authorizing Provider  atorvastatin (LIPITOR) 80 MG tablet Take 1 tablet (80  mg total) by mouth daily. 06/01/17  Yes Glori Luis, MD  clopidogrel (PLAVIX) 75 MG tablet Take 1 tablet (75 mg total) by mouth daily. 06/01/17  Yes Glori Luis, MD  docusate sodium (COLACE) 100 MG capsule Take 2 capsules (200 mg total) by mouth 2 (two) times daily. 08/10/17  Yes Auburn Bilberry, MD  ferrous sulfate 325 (65 FE) MG EC tablet Take 1 tablet (325 mg total) by mouth 2 (two) times daily. 06/06/17  Yes Glori Luis, MD  furosemide (LASIX) 40 MG tablet Take 1 tablet (40 mg total) by mouth 2 (two) times daily. 08/10/17  Yes Auburn Bilberry, MD  hydrocerin (EUCERIN) CREA Apply 1 application topically 2 (two) times daily. Apply to bilateral LEs and feet  twice daily after cleansing with house skin cleanser and gently patting dry. No not apply between toes. 08/10/17  Yes Auburn Bilberry, MD  lisinopril (PRINIVIL,ZESTRIL) 5 MG tablet Take 1 tablet (5 mg total) by mouth daily. 09/06/17  Yes Glori Luis, MD  metoprolol succinate (TOPROL-XL) 25 MG 24 hr tablet Take 1 tablet (25 mg total) by mouth daily. 06/01/17  Yes Glori Luis, MD  polyethylene glycol [facility] / GLYCOLAX) packet Take 17 g by mouth daily. 08/10/17  Yes Auburn Bilberry, MD  potassium chloride (K-DUR) 10 MEQ tablet Take 1 tablet (10 mEq total) by mouth daily. 06/01/17  Yes Glori Luis, MD    Review of Systems  Constitutional: Positive for fatigue (minimal). Negative for appetite change.  HENT: Positive for hearing loss. Negative for congestion, postnasal drip and sore throat.   Eyes: Negative.   Respiratory: Negative for cough, chest tightness and shortness of breath.   Cardiovascular: Positive for leg swelling. Negative for chest pain and palpitations.  Gastrointestinal: Negative for abdominal distention and abdominal pain.  Endocrine: Negative.   Genitourinary: Negative.   Musculoskeletal: Negative for back pain and neck pain.  Skin: Negative.   Allergic/Immunologic: Negative.   Neurological: Negative for dizziness and light-headedness.  Hematological: Negative for adenopathy. Does not bruise/bleed easily.  Psychiatric/Behavioral: Negative for dysphoric mood and sleep disturbance (sleeping reclined on couch). The patient is not nervous/anxious.    Vitals:   09/15/17 0936  BP: (!) 115/36  Pulse: (!) 55  Resp: 18  SpO2: 99%  Weight: 258 lb 4 oz (117.1 kg)  Height:  (1.803 m)   Wt Readings from Last 3 Encounters:  09/15/17 258 lb 4 oz (117.1 kg)  09/06/17 262 lb 3.2 oz (118.9 kg)  08/18/17 249 lb 12 oz (113.3 kg)     Lab Results  Component Value Date   CREATININE 0.74 09/06/2017   CREATININE 0.81 08/10/2017   CREATININE 0.91 08/09/2017     Physical Exam  Constitutional: He appears well-developed and well-nourished.  HENT:  Head: Normocephalic and atraumatic.  Neck: Normal range of motion. Neck supple. No JVD present.  Cardiovascular: Regular rhythm. Bradycardia present.  Pulmonary/Chest: Effort normal. No respiratory distress. He has no wheezes. He has no rales.  Abdominal: Soft. He exhibits no distension. There is no tenderness.  Musculoskeletal: He exhibits edema (thickened skin on right lower leg with chronic venous skin changes; minimal edema in left lower leg). He exhibits no tenderness.  Nursing note and vitals reviewed.  Assessment & Plan:  1: Chronic heart failure with preserved ejection fraction- - NYHA class II - mildly fluid overloaded with edema in lower legs with R>L - not weighing daily but does have access to scales. Instructed patient and his brother (  who he's currently living with) to get up in the morning, use the bathroom and step on the scale and call for an overnight weight gain of >2 pounds or a weekly weight gain of >5 pounds - weight down 4 pounds from last week - not adding salt to his food. Brother does the cooking/shopping and has been trying to cook low sodium items. Reviewed the importance of closely following a  sodium diet and written dietary information was given to him about this - saw cardiology Mariah Milling) 08/18/17 - BNP 08/06/17 was 163.0  - PharmD reconciled medications with the patient  2: HTN- - BP looks ok today - saw PCP Birdie Sons) 09/06/17 - BMP 09/06/17 reviewed and showed sodium 139, potassium 3.9 and GFR 129.36 - is having lab work drawn by his PCP next week  3: Diabetes-  - currently diet controlled - A1c on 05/23/17 was 5.7%  4: Lymphedema- - has not been using his compression pumps but does have them - brother says that he will make sure the patient starts to use them twice daily for an hour each time - hasn't been propping his legs up much and he was encouraged to  prop them up when he's sitting for long periods of time; definitely needs to elevate them when using compression pumps  Patient did not bring his medications nor a list. Each medication was verbally reviewed with the patient and he was encouraged to bring the bottles to every visit to confirm accuracy of list.  Return in 1 month or sooner for any questions/problems before then.

## 2017-09-15 ENCOUNTER — Telehealth: Payer: Self-pay

## 2017-09-15 ENCOUNTER — Ambulatory Visit: Payer: Medicare Other | Admitting: Family

## 2017-09-15 ENCOUNTER — Encounter: Payer: Self-pay | Admitting: Family

## 2017-09-15 ENCOUNTER — Ambulatory Visit: Payer: Medicare Other | Attending: Family | Admitting: Family

## 2017-09-15 VITALS — BP 115/36 | HR 55 | Resp 18 | Ht 71.0 in | Wt 258.2 lb

## 2017-09-15 DIAGNOSIS — I890 Lymphedema, not elsewhere classified: Secondary | ICD-10-CM | POA: Insufficient documentation

## 2017-09-15 DIAGNOSIS — E119 Type 2 diabetes mellitus without complications: Secondary | ICD-10-CM | POA: Insufficient documentation

## 2017-09-15 DIAGNOSIS — Z87891 Personal history of nicotine dependence: Secondary | ICD-10-CM | POA: Diagnosis not present

## 2017-09-15 DIAGNOSIS — I509 Heart failure, unspecified: Secondary | ICD-10-CM | POA: Diagnosis present

## 2017-09-15 DIAGNOSIS — Z886 Allergy status to analgesic agent status: Secondary | ICD-10-CM | POA: Diagnosis not present

## 2017-09-15 DIAGNOSIS — Z6836 Body mass index (BMI) 36.0-36.9, adult: Secondary | ICD-10-CM | POA: Diagnosis not present

## 2017-09-15 DIAGNOSIS — I5032 Chronic diastolic (congestive) heart failure: Secondary | ICD-10-CM | POA: Diagnosis not present

## 2017-09-15 DIAGNOSIS — M1990 Unspecified osteoarthritis, unspecified site: Secondary | ICD-10-CM | POA: Insufficient documentation

## 2017-09-15 DIAGNOSIS — J45909 Unspecified asthma, uncomplicated: Secondary | ICD-10-CM | POA: Insufficient documentation

## 2017-09-15 DIAGNOSIS — L03115 Cellulitis of right lower limb: Secondary | ICD-10-CM | POA: Diagnosis not present

## 2017-09-15 DIAGNOSIS — I10 Essential (primary) hypertension: Secondary | ICD-10-CM

## 2017-09-15 DIAGNOSIS — I252 Old myocardial infarction: Secondary | ICD-10-CM | POA: Insufficient documentation

## 2017-09-15 DIAGNOSIS — E669 Obesity, unspecified: Secondary | ICD-10-CM | POA: Insufficient documentation

## 2017-09-15 DIAGNOSIS — M25562 Pain in left knee: Secondary | ICD-10-CM | POA: Diagnosis not present

## 2017-09-15 DIAGNOSIS — Z823 Family history of stroke: Secondary | ICD-10-CM | POA: Insufficient documentation

## 2017-09-15 DIAGNOSIS — K219 Gastro-esophageal reflux disease without esophagitis: Secondary | ICD-10-CM | POA: Diagnosis not present

## 2017-09-15 DIAGNOSIS — E1159 Type 2 diabetes mellitus with other circulatory complications: Secondary | ICD-10-CM

## 2017-09-15 DIAGNOSIS — I110 Hypertensive heart disease with heart failure: Secondary | ICD-10-CM | POA: Diagnosis not present

## 2017-09-15 DIAGNOSIS — Z79899 Other long term (current) drug therapy: Secondary | ICD-10-CM | POA: Diagnosis not present

## 2017-09-15 DIAGNOSIS — Z7902 Long term (current) use of antithrombotics/antiplatelets: Secondary | ICD-10-CM | POA: Diagnosis not present

## 2017-09-15 DIAGNOSIS — E785 Hyperlipidemia, unspecified: Secondary | ICD-10-CM | POA: Insufficient documentation

## 2017-09-15 DIAGNOSIS — Z8249 Family history of ischemic heart disease and other diseases of the circulatory system: Secondary | ICD-10-CM | POA: Insufficient documentation

## 2017-09-15 DIAGNOSIS — I2510 Atherosclerotic heart disease of native coronary artery without angina pectoris: Secondary | ICD-10-CM | POA: Diagnosis not present

## 2017-09-15 DIAGNOSIS — L03116 Cellulitis of left lower limb: Secondary | ICD-10-CM | POA: Diagnosis not present

## 2017-09-15 NOTE — Telephone Encounter (Signed)
Copied from CRM [id number]. Topic: Quick Communication - See Telephone Encounter >> Sep 15, 2017  1:50 PM Waymon Amato wrote: Jeraldine Loots with [facility] is a speech therapist that is calling for verbal orders for a social. Worker to come in and evaluate the patient   Best number [phone]

## 2017-09-15 NOTE — Patient Instructions (Signed)
Continue weighing daily and call for an overnight weight gain of > 2 pounds or a weekly weight gain of >5 pounds. 

## 2017-09-15 NOTE — Telephone Encounter (Signed)
Verbal order given per Dr.Sonnenberg 

## 2017-09-16 DIAGNOSIS — I110 Hypertensive heart disease with heart failure: Secondary | ICD-10-CM | POA: Diagnosis not present

## 2017-09-16 DIAGNOSIS — M25562 Pain in left knee: Secondary | ICD-10-CM | POA: Diagnosis not present

## 2017-09-16 DIAGNOSIS — L03116 Cellulitis of left lower limb: Secondary | ICD-10-CM | POA: Diagnosis not present

## 2017-09-16 DIAGNOSIS — L03115 Cellulitis of right lower limb: Secondary | ICD-10-CM | POA: Diagnosis not present

## 2017-09-16 DIAGNOSIS — E119 Type 2 diabetes mellitus without complications: Secondary | ICD-10-CM | POA: Diagnosis not present

## 2017-09-16 DIAGNOSIS — I5032 Chronic diastolic (congestive) heart failure: Secondary | ICD-10-CM | POA: Diagnosis not present

## 2017-09-19 DIAGNOSIS — I110 Hypertensive heart disease with heart failure: Secondary | ICD-10-CM | POA: Diagnosis not present

## 2017-09-19 DIAGNOSIS — L03115 Cellulitis of right lower limb: Secondary | ICD-10-CM | POA: Diagnosis not present

## 2017-09-19 DIAGNOSIS — M25562 Pain in left knee: Secondary | ICD-10-CM | POA: Diagnosis not present

## 2017-09-19 DIAGNOSIS — L03116 Cellulitis of left lower limb: Secondary | ICD-10-CM | POA: Diagnosis not present

## 2017-09-19 DIAGNOSIS — E119 Type 2 diabetes mellitus without complications: Secondary | ICD-10-CM | POA: Diagnosis not present

## 2017-09-19 DIAGNOSIS — I5032 Chronic diastolic (congestive) heart failure: Secondary | ICD-10-CM | POA: Diagnosis not present

## 2017-09-20 DIAGNOSIS — L03115 Cellulitis of right lower limb: Secondary | ICD-10-CM | POA: Diagnosis not present

## 2017-09-20 DIAGNOSIS — I110 Hypertensive heart disease with heart failure: Secondary | ICD-10-CM | POA: Diagnosis not present

## 2017-09-20 DIAGNOSIS — L03116 Cellulitis of left lower limb: Secondary | ICD-10-CM | POA: Diagnosis not present

## 2017-09-20 DIAGNOSIS — I5032 Chronic diastolic (congestive) heart failure: Secondary | ICD-10-CM | POA: Diagnosis not present

## 2017-09-20 DIAGNOSIS — E119 Type 2 diabetes mellitus without complications: Secondary | ICD-10-CM | POA: Diagnosis not present

## 2017-09-20 DIAGNOSIS — M25562 Pain in left knee: Secondary | ICD-10-CM | POA: Diagnosis not present

## 2017-09-21 ENCOUNTER — Ambulatory Visit (INDEPENDENT_AMBULATORY_CARE_PROVIDER_SITE_OTHER): Payer: Medicare Other

## 2017-09-21 ENCOUNTER — Other Ambulatory Visit (INDEPENDENT_AMBULATORY_CARE_PROVIDER_SITE_OTHER): Payer: Medicare Other

## 2017-09-21 VITALS — BP 122/54 | HR 55

## 2017-09-21 DIAGNOSIS — I5032 Chronic diastolic (congestive) heart failure: Secondary | ICD-10-CM | POA: Diagnosis not present

## 2017-09-21 DIAGNOSIS — M25562 Pain in left knee: Secondary | ICD-10-CM | POA: Diagnosis not present

## 2017-09-21 DIAGNOSIS — I10 Essential (primary) hypertension: Secondary | ICD-10-CM

## 2017-09-21 DIAGNOSIS — D696 Thrombocytopenia, unspecified: Secondary | ICD-10-CM

## 2017-09-21 DIAGNOSIS — E119 Type 2 diabetes mellitus without complications: Secondary | ICD-10-CM | POA: Diagnosis not present

## 2017-09-21 DIAGNOSIS — L03116 Cellulitis of left lower limb: Secondary | ICD-10-CM | POA: Diagnosis not present

## 2017-09-21 DIAGNOSIS — L03115 Cellulitis of right lower limb: Secondary | ICD-10-CM | POA: Diagnosis not present

## 2017-09-21 DIAGNOSIS — I110 Hypertensive heart disease with heart failure: Secondary | ICD-10-CM | POA: Diagnosis not present

## 2017-09-21 LAB — CBC
HEMATOCRIT: 38.5 % — AB (ref 39.0–52.0)
HEMOGLOBIN: 13 g/dL (ref 13.0–17.0)
MCHC: 33.7 g/dL (ref 30.0–36.0)
MCV: 80.7 fl (ref 78.0–100.0)
PLATELETS: 160 10*3/uL (ref 150.0–400.0)
RBC: 4.77 Mil/uL (ref 4.22–5.81)
RDW: 15.8 % — ABNORMAL HIGH (ref 11.5–15.5)
WBC: 5.9 10*3/uL (ref 4.0–10.5)

## 2017-09-21 NOTE — Progress Notes (Signed)
Diastolic blood pressures are slightly low.  Patient is okay to continue his current regimen as long as he is not having lightheadedness.  If he is having lightheadedness we may need to decrease 1 of his medications.

## 2017-09-21 NOTE — Progress Notes (Signed)
Patient was here for BP check today. Patient is taking medication as prescribed  BP: 116/58 ( left arm) P: 56 O2: 97  BP: 122/54 ( right arm) P: 55 O2: 97

## 2017-09-22 DIAGNOSIS — L03116 Cellulitis of left lower limb: Secondary | ICD-10-CM | POA: Diagnosis not present

## 2017-09-22 DIAGNOSIS — E119 Type 2 diabetes mellitus without complications: Secondary | ICD-10-CM | POA: Diagnosis not present

## 2017-09-22 DIAGNOSIS — M25562 Pain in left knee: Secondary | ICD-10-CM | POA: Diagnosis not present

## 2017-09-22 DIAGNOSIS — I110 Hypertensive heart disease with heart failure: Secondary | ICD-10-CM | POA: Diagnosis not present

## 2017-09-22 DIAGNOSIS — I5032 Chronic diastolic (congestive) heart failure: Secondary | ICD-10-CM | POA: Diagnosis not present

## 2017-09-22 DIAGNOSIS — L03115 Cellulitis of right lower limb: Secondary | ICD-10-CM | POA: Diagnosis not present

## 2017-09-22 NOTE — Progress Notes (Signed)
Patient notified of Dr. Kermit Balo recommendations and states that he is not having any lightheadedness and verbalized understanding.

## 2017-09-23 DIAGNOSIS — L03115 Cellulitis of right lower limb: Secondary | ICD-10-CM | POA: Diagnosis not present

## 2017-09-23 DIAGNOSIS — L03116 Cellulitis of left lower limb: Secondary | ICD-10-CM | POA: Diagnosis not present

## 2017-09-23 DIAGNOSIS — M25562 Pain in left knee: Secondary | ICD-10-CM | POA: Diagnosis not present

## 2017-09-23 DIAGNOSIS — I110 Hypertensive heart disease with heart failure: Secondary | ICD-10-CM | POA: Diagnosis not present

## 2017-09-23 DIAGNOSIS — I5032 Chronic diastolic (congestive) heart failure: Secondary | ICD-10-CM | POA: Diagnosis not present

## 2017-09-23 DIAGNOSIS — E119 Type 2 diabetes mellitus without complications: Secondary | ICD-10-CM | POA: Diagnosis not present

## 2017-09-26 ENCOUNTER — Emergency Department (HOSPITAL_COMMUNITY): Payer: Medicare Other

## 2017-09-26 ENCOUNTER — Ambulatory Visit: Payer: Self-pay

## 2017-09-26 ENCOUNTER — Inpatient Hospital Stay (HOSPITAL_COMMUNITY)
Admission: EM | Admit: 2017-09-26 | Discharge: 2017-09-29 | DRG: 872 | Disposition: A | Discharge status: Home Health | Payer: Medicare Other | Attending: Internal Medicine | Admitting: Internal Medicine

## 2017-09-26 DIAGNOSIS — R945 Abnormal results of liver function studies: Secondary | ICD-10-CM | POA: Diagnosis not present

## 2017-09-26 DIAGNOSIS — E119 Type 2 diabetes mellitus without complications: Secondary | ICD-10-CM | POA: Diagnosis present

## 2017-09-26 DIAGNOSIS — A419 Sepsis, unspecified organism: Secondary | ICD-10-CM | POA: Diagnosis not present

## 2017-09-26 DIAGNOSIS — B9620 Unspecified Escherichia coli [E. coli] as the cause of diseases classified elsewhere: Secondary | ICD-10-CM | POA: Diagnosis present

## 2017-09-26 DIAGNOSIS — I252 Old myocardial infarction: Secondary | ICD-10-CM | POA: Diagnosis not present

## 2017-09-26 DIAGNOSIS — R7989 Other specified abnormal findings of blood chemistry: Secondary | ICD-10-CM

## 2017-09-26 DIAGNOSIS — M7989 Other specified soft tissue disorders: Secondary | ICD-10-CM | POA: Diagnosis not present

## 2017-09-26 DIAGNOSIS — I5032 Chronic diastolic (congestive) heart failure: Secondary | ICD-10-CM | POA: Diagnosis not present

## 2017-09-26 DIAGNOSIS — Z6835 Body mass index (BMI) 35.0-35.9, adult: Secondary | ICD-10-CM | POA: Diagnosis not present

## 2017-09-26 DIAGNOSIS — I110 Hypertensive heart disease with heart failure: Secondary | ICD-10-CM | POA: Diagnosis present

## 2017-09-26 DIAGNOSIS — E1159 Type 2 diabetes mellitus with other circulatory complications: Secondary | ICD-10-CM | POA: Diagnosis not present

## 2017-09-26 DIAGNOSIS — E11628 Type 2 diabetes mellitus with other skin complications: Secondary | ICD-10-CM

## 2017-09-26 DIAGNOSIS — K219 Gastro-esophageal reflux disease without esophagitis: Secondary | ICD-10-CM | POA: Diagnosis present

## 2017-09-26 DIAGNOSIS — E872 Acidosis: Secondary | ICD-10-CM | POA: Diagnosis present

## 2017-09-26 DIAGNOSIS — Z7902 Long term (current) use of antithrombotics/antiplatelets: Secondary | ICD-10-CM

## 2017-09-26 DIAGNOSIS — E785 Hyperlipidemia, unspecified: Secondary | ICD-10-CM | POA: Diagnosis present

## 2017-09-26 DIAGNOSIS — Z823 Family history of stroke: Secondary | ICD-10-CM | POA: Diagnosis not present

## 2017-09-26 DIAGNOSIS — I2510 Atherosclerotic heart disease of native coronary artery without angina pectoris: Secondary | ICD-10-CM | POA: Diagnosis present

## 2017-09-26 DIAGNOSIS — Z8249 Family history of ischemic heart disease and other diseases of the circulatory system: Secondary | ICD-10-CM

## 2017-09-26 DIAGNOSIS — I352 Nonrheumatic aortic (valve) stenosis with insufficiency: Secondary | ICD-10-CM | POA: Diagnosis present

## 2017-09-26 DIAGNOSIS — L03116 Cellulitis of left lower limb: Secondary | ICD-10-CM | POA: Diagnosis not present

## 2017-09-26 DIAGNOSIS — N390 Urinary tract infection, site not specified: Secondary | ICD-10-CM | POA: Diagnosis not present

## 2017-09-26 DIAGNOSIS — E876 Hypokalemia: Secondary | ICD-10-CM | POA: Diagnosis present

## 2017-09-26 DIAGNOSIS — I10 Essential (primary) hypertension: Secondary | ICD-10-CM | POA: Diagnosis not present

## 2017-09-26 DIAGNOSIS — M25562 Pain in left knee: Secondary | ICD-10-CM | POA: Diagnosis not present

## 2017-09-26 DIAGNOSIS — Z87891 Personal history of nicotine dependence: Secondary | ICD-10-CM | POA: Diagnosis not present

## 2017-09-26 DIAGNOSIS — L03115 Cellulitis of right lower limb: Secondary | ICD-10-CM | POA: Diagnosis present

## 2017-09-26 DIAGNOSIS — I83813 Varicose veins of bilateral lower extremities with pain: Secondary | ICD-10-CM | POA: Diagnosis present

## 2017-09-26 DIAGNOSIS — R509 Fever, unspecified: Secondary | ICD-10-CM | POA: Diagnosis not present

## 2017-09-26 DIAGNOSIS — R0602 Shortness of breath: Secondary | ICD-10-CM | POA: Diagnosis not present

## 2017-09-26 DIAGNOSIS — Z9049 Acquired absence of other specified parts of digestive tract: Secondary | ICD-10-CM | POA: Diagnosis not present

## 2017-09-26 DIAGNOSIS — A4151 Sepsis due to Escherichia coli [E. coli]: Secondary | ICD-10-CM | POA: Diagnosis not present

## 2017-09-26 DIAGNOSIS — L03119 Cellulitis of unspecified part of limb: Secondary | ICD-10-CM

## 2017-09-26 LAB — COMPREHENSIVE METABOLIC PANEL
ALBUMIN: 3 g/dL — AB (ref 3.5–5.0)
ALT: 19 U/L (ref 17–63)
AST: 22 U/L (ref 15–41)
Alkaline Phosphatase: 73 U/L (ref 38–126)
Anion gap: 12 (ref 5–15)
BUN: 16 mg/dL (ref 6–20)
CHLORIDE: 105 mmol/L (ref 101–111)
CO2: 26 mmol/L (ref 22–32)
CREATININE: 0.91 mg/dL (ref 0.61–1.24)
Calcium: 8.8 mg/dL — ABNORMAL LOW (ref 8.9–10.3)
GFR calc non Af Amer: >60 mL/min (ref >60)
GLUCOSE: 106 mg/dL — AB (ref 65–99)
Potassium: 3.2 mmol/L — ABNORMAL LOW (ref 3.5–5.1)
SODIUM: 143 mmol/L (ref 135–145)
Total Bilirubin: 3.2 mg/dL — ABNORMAL HIGH (ref 0.3–1.2)
Total Protein: 6.4 g/dL — ABNORMAL LOW (ref 6.5–8.1)

## 2017-09-26 LAB — URINALYSIS, ROUTINE W REFLEX MICROSCOPIC
BILIRUBIN URINE: NEGATIVE
Glucose, UA: NEGATIVE mg/dL
Ketones, ur: NEGATIVE mg/dL
Nitrite: POSITIVE — AB
Protein, ur: 30 mg/dL — AB
SPECIFIC GRAVITY, URINE: 1.02 (ref 1.005–1.030)
WBC, UA: >50 WBC/hpf — ABNORMAL HIGH (ref 0–5)
pH: 5 (ref 5.0–8.0)

## 2017-09-26 LAB — CBC WITH DIFFERENTIAL/PLATELET
BASOS PCT: 0 %
Basophils Absolute: 0 10*3/uL (ref 0.0–0.1)
EOS ABS: 0 10*3/uL (ref 0.0–0.7)
EOS PCT: 0 %
HCT: 38.8 % — ABNORMAL LOW (ref 39.0–52.0)
Hemoglobin: 12.5 g/dL — ABNORMAL LOW (ref 13.0–17.0)
Lymphocytes Relative: 6 %
Lymphs Abs: 0.9 10*3/uL (ref 0.7–4.0)
MCH: 26.7 pg (ref 26.0–34.0)
MCHC: 32.2 g/dL (ref 30.0–36.0)
MCV: 82.9 fL (ref 78.0–100.0)
Monocytes Absolute: 0.9 10*3/uL (ref 0.1–1.0)
Monocytes Relative: 7 %
Neutro Abs: 12.4 10*3/uL — ABNORMAL HIGH (ref 1.7–7.7)
Neutrophils Relative %: 87 %
PLATELETS: 170 10*3/uL (ref 150–400)
RBC: 4.68 MIL/uL (ref 4.22–5.81)
RDW: 15.3 % (ref 11.5–15.5)
WBC: 14.3 10*3/uL — AB (ref 4.0–10.5)

## 2017-09-26 LAB — I-STAT CG4 LACTIC ACID, ED: Lactic Acid, Venous: 2.29 mmol/L (ref 0.5–1.9)

## 2017-09-26 LAB — CBG MONITORING, ED
GLUCOSE-CAPILLARY: 118 mg/dL — AB (ref 65–99)
Glucose-Capillary: 90 mg/dL (ref 65–99)

## 2017-09-26 MED ORDER — VANCOMYCIN HCL 10 G IV SOLR
2000.0000 mg | Freq: Once | INTRAVENOUS | Status: AC
  Administered 2017-09-27: 2000 mg via INTRAVENOUS
  Filled 2017-09-26: qty 2000

## 2017-09-26 MED ORDER — PIPERACILLIN-TAZOBACTAM 3.375 G IVPB 30 MIN
3.3750 g | Freq: Once | INTRAVENOUS | Status: AC
  Administered 2017-09-27: 3.375 g via INTRAVENOUS
  Filled 2017-09-26: qty 50

## 2017-09-26 MED ORDER — SODIUM CHLORIDE 0.9 % IV BOLUS
500.0000 mL | Freq: Once | INTRAVENOUS | Status: AC
  Administered 2017-09-26: 500 mL via INTRAVENOUS

## 2017-09-26 MED ORDER — VANCOMYCIN HCL IN DEXTROSE 1-5 GM/200ML-% IV SOLN
1000.0000 mg | Freq: Once | INTRAVENOUS | Status: DC
  Administered 2017-09-26: 1000 mg via INTRAVENOUS
  Filled 2017-09-26: qty 200

## 2017-09-26 NOTE — Telephone Encounter (Signed)
Will from [facility] calling to report pt's right leg looks more edematous and red than prior visits. Pt has cellulitis to both lower legs right greater than left. Pt has the chills and fever to 100.2, poor appetite and not drinking and had an episode of incontinence. Pt states it hurts to walk on it and that was why he was incontinent. Pt has a h/o IV abx for the cellulitis. Per Will, pts right lower leg is warm, red and swollen more so today than yesterday. Due to sx and fever advised to call 911 to have pt safely transported to the ED.   Reason for Disposition . [1] Swelling is painful to touch AND [2] fever  Answer Assessment - Initial Assessment Questions 1. ONSET: "When did the swelling start?" (e.g., minutes, hours, days)    Months since before Christmas 2. LOCATION: "What part of the leg is swollen?"  "Are both legs swollen or just one leg?"     Both lower legs are swollen but the right lower leg is red and edematous and pt stated that it hurts to walk on it 3. SEVERITY: "How bad is the swelling?" (e.g., localized; mild, moderate, severe)  - Localized - small area of swelling localized to one leg  - MILD pedal edema - swelling limited to foot and ankle, pitting edema < 1/4 inch (6 mm) deep, rest and elevation eliminate most or all swelling  - MODERATE edema - swelling of lower leg to knee, pitting edema > 1/4 inch (6 mm) deep, rest and elevation only partially reduce swelling  - SEVERE edema - swelling extends above knee, facial or hand swelling present      moderate 4. REDNESS: "Does the swelling look red or infected?"     Yes- cellulitis and warm  5. PAIN: "Is the swelling painful to touch?" If so, ask: "How painful is it?"   (Scale 1-10; mild, moderate or severe)     Hurts when he walks 6. FEVER: "Do you have a fever?" If so, ask: "What is it, how was it measured, and when did it start?"      100.2 7. CAUSE: "What do you think is causing the leg swelling?"     Cellulitis on both  legs right greater than left 8. MEDICAL HISTORY: "Do you have a history of heart failure, kidney disease, liver failure, or cancer?"     Heart failure, Chronic diastolic CHF 9. RECURRENT SYMPTOM: "Have you had leg swelling before?" If so, ask: "When was the last time?" "What happened that time?"     yes 10. OTHER SYMPTOMS: "Do you have any other symptoms?" (e.g., chest pain, difficulty breathing)       Chills not feeling well. Headache, episode of incontinence, poor appetite, inadequate fluid intake 11. PREGNANCY: "Is there any chance you are pregnant?" "When was your last menstrual period?"       n/a  Protocols used: LEG SWELLING AND EDEMA-A-AH

## 2017-09-26 NOTE — Progress Notes (Signed)
CSW received phone call from Eugenio Hoes with [facility]. Pt has home health with [facility].   [address], [facility]  [phone]

## 2017-09-26 NOTE — ED Notes (Signed)
Pocket knife collected from pt and placed in labeled biohazard bag. Picked up by security to place in lockbox until d/c. Pt given instructions to contact Security office to collect item upon d/c. RN advised. [employer]

## 2017-09-26 NOTE — Progress Notes (Signed)
Pharmacy Antibiotic Note  [name] is a 84 y.o. male admitted on 09/26/2017 with cellulitis.  Pharmacy has been consulted for vancomycin dosing.  Plan: Vancomycin 2500 mg (1 Gm + 1500 mg)  x1 then 1500 mg IV q24h for est AUC = 476 Goal AUC = 400-500 Rocephin 2 Gm IV q24h   Height:  (180.3 cm) Weight: 258 lb (117 kg) IBW/kg (Calculated) : 75.3  Temp (24hrs), Avg:101 F (38.3 C), Min:101 F (38.3 C), Max:101 F (38.3 C)  Recent Labs  Lab 09/21/17 0955 09/26/17 2015 09/26/17 2024  WBC 5.9 14.3*  --   CREATININE  --  0.91  --   LATICACIDVEN  --   --  2.29*    Estimated Creatinine Clearance: 78.6 mL/min (by C-G formula based on SCr of 0.91 mg/dL).    Allergies  Allergen Reactions  . Aspirin Rash and Other (See Comments)    Reaction: Trouble breathing     Antimicrobials this admission: 5/13 zosyn >> x1 ED 5/14 rocephin >> 5/13 vancomycin >>   Dose adjustments this admission:   Microbiology results:  BCx:   UCx:    Sputum:    MRSA PCR:  Thank you for allowing pharmacy to be a part of this patient's care.  Lorenza Evangelist 09/26/2017 11:49 PM

## 2017-09-26 NOTE — ED Provider Notes (Signed)
[facility]-EMERGENCY DEPT Provider Note   CSN: [id number] Arrival date & time: 09/26/17  1829     History   Chief Complaint Chief Complaint  Patient presents with  . Fever  . Cellulitis    HPI [name] is a 84 y.o. male.  HPI Patient is a very poor historian.  Presents by EMS.  Per EMS home health called due to fever of 101.2 at home and concern for right lower extremity cellulitis.  Patient denies cough, shortness of breath, abdominal pain, nausea, vomiting, diarrhea or lower extremity pain.  States the swelling and erythema has significantly improved after taking antibiotics. Past Medical History:  Diagnosis Date  . Aortic atherosclerosis (HCC) 05/2013   Per TEE  . Arrhythmia   . Asthma   . CHF (congestive heart failure) (HCC)   . Colon polyp   . Coronary artery disease    a. Nonobstructive by 2004 cath b. Nonobstructive by 2015 cath in setting of NSTEMI  . GERD (gastroesophageal reflux disease)   . Heart murmur   . Hiatal hernia   . Hyperlipidemia   . Hypertension   . Lymphedema   . Moderate aortic stenosis    a. echo 2013: EF 55%, nl wall motion, mild DD, mild LVH, trace MR, moderate AS with peak gradient of 55 mm Hg; b. TEE 05/2013: EF EF 60-65%, borderline LVH, mild MR/AI, mod AS with valve area 1.0-1.26; c. echo 10/2014: 55-60%, no RWMA, GR1DD, mod AS w/ mean gradient of 28 mm Hg     . Obesity   . Onychomycosis   . Osteoarthritis     Patient Active Problem List   Diagnosis Date Noted  . Poor social situation 09/06/2017  . Chronic diastolic heart failure (HCC)   . Bilateral lower leg cellulitis 08/06/2017  . Venous ulcer (HCC) 07/08/2017  . Osteoarthritis 05/28/2017  . Varicose veins of both lower extremities with pain 04/10/2017  . Iron deficiency anemia 11/25/2016  . Chronic venous insufficiency 10/04/2016  . Lymphedema 06/08/2016  . Morbid obesity (HCC) 03/26/2015  . Coronary artery disease, non-occlusive 06/05/2013  .  Hypertension 06/05/2013  . Hyperlipidemia 02/23/2012  . Diabetes mellitus (HCC) 02/23/2012  . Moderate aortic stenosis 02/23/2012    Past Surgical History:  Procedure Laterality Date  . CARDIAC CATHETERIZATION  11/2002  . CARDIAC CATHETERIZATION  05/21/2013   showing 30% oLM, 30% D1, 20% mLCx, 30% pRCA; EF 60%, severe AS (mean gradient 28 mmHg, peak 26, area 0.9)  . CHOLECYSTECTOMY  2001  . TRANSESOPHAGEAL ECHOCARDIOGRAM  05/22/2013   Preserved EF, moderate AS (valve area by planimetry between 1.0-1.26 cm sq), moderate aortic arch and descending aortic atherosclerosis.   . TRANSTHORACIC ECHOCARDIOGRAM  05/19/2013   EF 60-65%, impaired diastolic function, mild LA dilatation, mild MR/AI/TR, mod AS, high normal RVSP (30.4 mmHg)        Home Medications    Prior to Admission medications   Medication Sig Start Date End Date Taking? Authorizing Provider  atorvastatin (LIPITOR) 80 MG tablet Take 1 tablet (80 mg total) by mouth daily. 06/01/17  Yes Glori Luis, MD  clopidogrel (PLAVIX) 75 MG tablet Take 1 tablet (75 mg total) by mouth daily. 06/01/17  Yes Glori Luis, MD  docusate sodium (COLACE) 100 MG capsule Take 2 capsules (200 mg total) by mouth 2 (two) times daily. 08/10/17  Yes Auburn Bilberry, MD  ferrous sulfate 325 (65 FE) MG EC tablet Take 1 tablet (325 mg total) by mouth 2 (two) times daily.  06/06/17  Yes Glori Luis, MD  furosemide (LASIX) 40 MG tablet Take 1 tablet (40 mg total) by mouth 2 (two) times daily. 08/10/17  Yes Auburn Bilberry, MD  hydrocerin (EUCERIN) CREA Apply 1 application topically 2 (two) times daily. Apply to bilateral LEs and feet twice daily after cleansing with house skin cleanser and gently patting dry. No not apply between toes. 08/10/17  Yes Auburn Bilberry, MD  lisinopril (PRINIVIL,ZESTRIL) 5 MG tablet Take 1 tablet (5 mg total) by mouth daily. 09/06/17  Yes Glori Luis, MD  metoprolol succinate (TOPROL-XL) 25 MG 24 hr tablet Take 1 tablet  (25 mg total) by mouth daily. 06/01/17  Yes Glori Luis, MD  polyethylene glycol [facility] / GLYCOLAX) packet Take 17 g by mouth daily. 08/10/17  Yes Auburn Bilberry, MD  potassium chloride (K-DUR) 10 MEQ tablet Take 1 tablet (10 mEq total) by mouth daily. 06/01/17  Yes Glori Luis, MD    Family History Family History  Problem Relation Age of Onset  . Heart disease Mother   . Heart disease Father 62  . Stroke Brother     Social History Social History   Tobacco Use  . Smoking status: Former Smoker    Packs/Klute: 1.50    Years: 20.00    Pack years: 30.00    Types: Cigarettes    Last attempt to quit: 02/22/1981    Years since quitting: 36.6  . Smokeless tobacco: Never Used  Substance Use Topics  . Alcohol use: No  . Drug use: No     Allergies   Aspirin   Review of Systems Review of Systems  Constitutional: Positive for fever. Negative for chills.  HENT: Negative for congestion, sinus pressure, sinus pain and sore throat.   Respiratory: Negative for cough and shortness of breath.   Cardiovascular: Positive for leg swelling. Negative for chest pain.  Gastrointestinal: Negative for abdominal pain, constipation, diarrhea, nausea and vomiting.  Genitourinary: Negative for dysuria, flank pain and frequency.  Musculoskeletal: Negative for back pain and neck pain.  Skin: Positive for color change and rash. Negative for wound.  Neurological: Negative for dizziness, weakness, light-headedness, numbness and headaches.  All other systems reviewed and are negative.    Physical Exam Updated Vital Signs BP (!) 116/52 (BP Location: Left Arm)   Pulse 79   Temp (!) 101 F (38.3 C) (Oral)   Resp 18   Ht  (1.803 m)   Wt 117 kg (258 lb)   SpO2 98%   BMI 35.98 kg/m   Physical Exam  Constitutional: He is oriented to person, place, and time. He appears well-developed and well-nourished. No distress.  HENT:  Head: Normocephalic and atraumatic.  Right Ear: External  ear normal.  Left Ear: External ear normal.  Mouth/Throat: Oropharynx is clear and moist. No oropharyngeal exudate.  Eyes: Pupils are equal, round, and reactive to light. EOM are normal.  Neck: Normal range of motion. Neck supple. No JVD present.  Cardiovascular: Normal rate and regular rhythm. Exam reveals no gallop and no friction rub.  No murmur heard. Pulmonary/Chest: Effort normal.  Diminished breath sounds bilateral bases.  Abdominal: Soft. Bowel sounds are normal. There is no tenderness. There is no rebound and no guarding. A hernia is present.  Ventral hernia without tenderness.  Easily reduced.  Musculoskeletal: Normal range of motion. He exhibits edema. He exhibits no tenderness.  Patient with thickened and well demarcated erythema to the right lower extremity just proximal to the ankle.  3+ edema in the right lower extremity compared to the left.  No definite warmth.  Distal pulses intact.  Full  Lymphadenopathy:    He has no cervical adenopathy.  Neurological: He is alert and oriented to person, place, and time.  5/5 motor in all extremities.  Sensation intact.  Skin: Skin is warm and dry. Capillary refill takes less than 2 seconds. No rash noted. He is not diaphoretic. There is erythema.  Psychiatric: He has a normal mood and affect. His behavior is normal.  Nursing note and vitals reviewed.    ED Treatments / Results  Labs (all labs ordered are listed, but only abnormal results are displayed) Labs Reviewed  CBC WITH DIFFERENTIAL/PLATELET - Abnormal; Notable for the following components:      Result Value   WBC 14.3 (*)    Hemoglobin 12.5 (*)    HCT 38.8 (*)    Neutro Abs 12.4 (*)    All other components within normal limits  COMPREHENSIVE METABOLIC PANEL - Abnormal; Notable for the following components:   Potassium 3.2 (*)    Glucose, Bld 106 (*)    Calcium 8.8 (*)    Total Protein 6.4 (*)    Albumin 3.0 (*)    Total Bilirubin 3.2 (*)    All other components  within normal limits  CBG MONITORING, ED - Abnormal; Notable for the following components:   Glucose-Capillary 118 (*)    All other components within normal limits  I-STAT CG4 LACTIC ACID, ED - Abnormal; Notable for the following components:   Lactic Acid, Venous 2.29 (*)    All other components within normal limits  CULTURE, BLOOD (ROUTINE X 2)  CULTURE, BLOOD (ROUTINE X 2)  URINALYSIS, ROUTINE W REFLEX MICROSCOPIC    EKG None  Radiology Dg Chest Port 1 View  Result Date: 09/26/2017 CLINICAL DATA:  complaints of cellulitis to the right leg with a fever of 101.2 today. Pt has had the cellulitis for the last year but has gotten progressively worse over the past week. Short of breath EXAM: PORTABLE CHEST 1 VIEW COMPARISON:  Chest x-ray dated 07/16/2016 FINDINGS: Heart size and mediastinal contours are stable. Both lungs remain clear. No pleural effusion or pneumothorax seen. Osseous structures about the chest are unremarkable. IMPRESSION: No active disease.  No evidence of pneumonia or pulmonary edema. Electronically Signed   By: Bary Richard M.D.   On: 09/26/2017 19:39    Procedures Procedures (including critical care time)  Medications Ordered in ED Medications  sodium chloride 0.9 % bolus 500 mL (500 mLs Intravenous Incomplete 09/26/17 2133)  vancomycin (VANCOCIN) IVPB 1000 mg/200 mL premix (1,000 mg Intravenous Incomplete 09/26/17 2133)  piperacillin-tazobactam (ZOSYN) IVPB 3.375 g (has no administration in time range)     Initial Impression / Assessment and Plan / ED Course  I have reviewed the triage vital signs and the nursing notes.  Pertinent labs & imaging results that were available during my care of the patient were reviewed by me and considered in my medical decision making (see chart for details).    Source of fever appears to be cellulitis of the right leg.  Treated with broad-spectrum antibiotics given IV fluids.  Discussed with hospitalist who will  admit.   Final Clinical Impressions(s) / ED Diagnoses   Final diagnoses:  Cellulitis of right leg    ED Discharge Orders    None       Loren Racer, MD 09/26/17 2316

## 2017-09-26 NOTE — ED Triage Notes (Addendum)
Pt to ed via [facility] with complaints of cellulitis to the right leg with a fever of 101.2 today.  Pt has had the cellulitis for the last year but has gotten progressively worse over the past week. Home health nurse saw him today and referred him here for antibiotics

## 2017-09-27 ENCOUNTER — Encounter (HOSPITAL_COMMUNITY): Payer: Self-pay

## 2017-09-27 ENCOUNTER — Other Ambulatory Visit: Payer: Self-pay

## 2017-09-27 ENCOUNTER — Inpatient Hospital Stay (HOSPITAL_COMMUNITY): Payer: Medicare Other

## 2017-09-27 ENCOUNTER — Inpatient Hospital Stay (HOSPITAL_COMMUNITY)
Admit: 2017-09-27 | Discharge: 2017-09-27 | Disposition: A | Discharge status: Home / Self Care | Payer: Medicare Other | Attending: Internal Medicine | Admitting: Internal Medicine

## 2017-09-27 DIAGNOSIS — M7989 Other specified soft tissue disorders: Secondary | ICD-10-CM

## 2017-09-27 LAB — COMPREHENSIVE METABOLIC PANEL
ALT: 17 U/L (ref 17–63)
AST: 29 U/L (ref 15–41)
Albumin: 2.6 g/dL — ABNORMAL LOW (ref 3.5–5.0)
Alkaline Phosphatase: 65 U/L (ref 38–126)
Anion gap: 11 (ref 5–15)
BILIRUBIN TOTAL: 3 mg/dL — AB (ref 0.3–1.2)
BUN: 16 mg/dL (ref 6–20)
CHLORIDE: 102 mmol/L (ref 101–111)
CO2: 26 mmol/L (ref 22–32)
Calcium: 8.2 mg/dL — ABNORMAL LOW (ref 8.9–10.3)
Creatinine, Ser: 0.76 mg/dL (ref 0.61–1.24)
GFR calc non Af Amer: >60 mL/min (ref >60)
Glucose, Bld: 96 mg/dL (ref 65–99)
POTASSIUM: 3.4 mmol/L — AB (ref 3.5–5.1)
Sodium: 139 mmol/L (ref 135–145)
TOTAL PROTEIN: 6.1 g/dL — AB (ref 6.5–8.1)

## 2017-09-27 LAB — GLUCOSE, CAPILLARY
Glucose-Capillary: 108 mg/dL — ABNORMAL HIGH (ref 65–99)
Glucose-Capillary: 80 mg/dL (ref 65–99)

## 2017-09-27 LAB — CBC
HEMATOCRIT: 39 % (ref 39.0–52.0)
Hemoglobin: 12.5 g/dL — ABNORMAL LOW (ref 13.0–17.0)
MCH: 26.8 pg (ref 26.0–34.0)
MCHC: 32.1 g/dL (ref 30.0–36.0)
MCV: 83.7 fL (ref 78.0–100.0)
PLATELETS: 148 10*3/uL — AB (ref 150–400)
RBC: 4.66 MIL/uL (ref 4.22–5.81)
RDW: 15.3 % (ref 11.5–15.5)
WBC: 11.1 10*3/uL — AB (ref 4.0–10.5)

## 2017-09-27 LAB — CBG MONITORING, ED
GLUCOSE-CAPILLARY: 73 mg/dL (ref 65–99)
GLUCOSE-CAPILLARY: 96 mg/dL (ref 65–99)

## 2017-09-27 LAB — SEDIMENTATION RATE: Sed Rate: 24 mm/hr — ABNORMAL HIGH (ref 0–16)

## 2017-09-27 LAB — PHOSPHORUS: PHOSPHORUS: 2.6 mg/dL (ref 2.5–4.6)

## 2017-09-27 LAB — HEMOGLOBIN A1C
HEMOGLOBIN A1C: 5.5 % (ref 4.8–5.6)
MEAN PLASMA GLUCOSE: 111.15 mg/dL

## 2017-09-27 LAB — LACTIC ACID, PLASMA
LACTIC ACID, VENOUS: 1.9 mmol/L (ref 0.5–1.9)
Lactic Acid, Venous: 1.9 mmol/L (ref 0.5–1.9)

## 2017-09-27 LAB — C-REACTIVE PROTEIN: CRP: 13.8 mg/dL — AB (ref <1.0)

## 2017-09-27 LAB — MAGNESIUM: MAGNESIUM: 1.8 mg/dL (ref 1.7–2.4)

## 2017-09-27 LAB — CK: CK TOTAL: 69 U/L (ref 49–397)

## 2017-09-27 MED ORDER — POLYETHYLENE GLYCOL 3350 17 G PO PACK
17.0000 g | PACK | Freq: Every day | ORAL | Status: DC
  Administered 2017-09-27 – 2017-09-28 (×2): 17 g via ORAL
  Filled 2017-09-27 (×2): qty 1

## 2017-09-27 MED ORDER — ACETAMINOPHEN 650 MG RE SUPP: 650.0000 mg | Freq: Four times a day (QID) | RECTAL | Status: DC | PRN

## 2017-09-27 MED ORDER — ONDANSETRON HCL 4 MG PO TABS: 4.0000 mg | ORAL_TABLET | Freq: Four times a day (QID) | ORAL | Status: DC | PRN

## 2017-09-27 MED ORDER — MORPHINE SULFATE (PF) 4 MG/ML IV SOLN: 1.0000 mg | INTRAVENOUS | Status: DC | PRN

## 2017-09-27 MED ORDER — LISINOPRIL 5 MG PO TABS
5.0000 mg | ORAL_TABLET | Freq: Every day | ORAL | Status: DC
  Administered 2017-09-27 – 2017-09-29 (×3): 5 mg via ORAL
  Filled 2017-09-27 (×3): qty 1

## 2017-09-27 MED ORDER — ONDANSETRON HCL 4 MG/2ML IJ SOLN: 4.0000 mg | Freq: Four times a day (QID) | INTRAMUSCULAR | Status: DC | PRN

## 2017-09-27 MED ORDER — POTASSIUM CHLORIDE 20 MEQ PO PACK
40.0000 meq | PACK | Freq: Once | ORAL | Status: AC
  Administered 2017-09-27: 40 meq via ORAL
  Filled 2017-09-27: qty 2

## 2017-09-27 MED ORDER — ATORVASTATIN CALCIUM 40 MG PO TABS
80.0000 mg | ORAL_TABLET | Freq: Every day | ORAL | Status: DC
  Administered 2017-09-27 – 2017-09-29 (×3): 80 mg via ORAL
  Filled 2017-09-27 (×2): qty 2
  Filled 2017-09-27: qty 1

## 2017-09-27 MED ORDER — SODIUM CHLORIDE 0.9 % IV SOLN
2.0000 g | Freq: Every day | INTRAVENOUS | Status: DC
  Administered 2017-09-27 – 2017-09-29 (×3): 2 g via INTRAVENOUS
  Filled 2017-09-27 (×2): qty 2
  Filled 2017-09-27: qty 20

## 2017-09-27 MED ORDER — TRAMADOL HCL 50 MG PO TABS: 100.0000 mg | ORAL_TABLET | Freq: Four times a day (QID) | ORAL | Status: DC | PRN

## 2017-09-27 MED ORDER — DOCUSATE SODIUM 100 MG PO CAPS
200.0000 mg | ORAL_CAPSULE | Freq: Two times a day (BID) | ORAL | Status: DC
  Administered 2017-09-27 – 2017-09-28 (×4): 200 mg via ORAL
  Filled 2017-09-27 (×4): qty 2

## 2017-09-27 MED ORDER — HEPARIN SODIUM (PORCINE) 5000 UNIT/ML IJ SOLN
5000.0000 [IU] | Freq: Three times a day (TID) | INTRAMUSCULAR | Status: DC
  Administered 2017-09-27 – 2017-09-29 (×7): 5000 [IU] via SUBCUTANEOUS
  Filled 2017-09-27 (×7): qty 1

## 2017-09-27 MED ORDER — INSULIN ASPART 100 UNIT/ML ~~LOC~~ SOLN: 0.0000 [IU] | Freq: Three times a day (TID) | SUBCUTANEOUS | Status: DC

## 2017-09-27 MED ORDER — ACETAMINOPHEN 325 MG PO TABS
650.0000 mg | ORAL_TABLET | Freq: Four times a day (QID) | ORAL | Status: DC | PRN
  Administered 2017-09-27: 650 mg via ORAL
  Filled 2017-09-27: qty 2

## 2017-09-27 MED ORDER — LACTATED RINGERS IV BOLUS
500.0000 mL | Freq: Once | INTRAVENOUS | Status: AC
  Administered 2017-09-27: 500 mL via INTRAVENOUS

## 2017-09-27 MED ORDER — CLOPIDOGREL BISULFATE 75 MG PO TABS
75.0000 mg | ORAL_TABLET | Freq: Every day | ORAL | Status: DC
  Administered 2017-09-27 – 2017-09-29 (×3): 75 mg via ORAL
  Filled 2017-09-27 (×3): qty 1

## 2017-09-27 MED ORDER — INSULIN ASPART 100 UNIT/ML ~~LOC~~ SOLN: 0.0000 [IU] | Freq: Every day | SUBCUTANEOUS | Status: DC

## 2017-09-27 MED ORDER — METOPROLOL SUCCINATE ER 25 MG PO TB24
25.0000 mg | ORAL_TABLET | Freq: Every day | ORAL | Status: DC
  Administered 2017-09-27 – 2017-09-29 (×3): 25 mg via ORAL
  Filled 2017-09-27 (×3): qty 1

## 2017-09-27 MED ORDER — OXYCODONE HCL 5 MG PO TABS: 5.0000 mg | ORAL_TABLET | ORAL | Status: DC | PRN

## 2017-09-27 MED ORDER — VANCOMYCIN HCL 10 G IV SOLR
1500.0000 mg | INTRAVENOUS | Status: DC
  Administered 2017-09-28 – 2017-09-29 (×2): 1500 mg via INTRAVENOUS
  Filled 2017-09-27 (×2): qty 1500

## 2017-09-27 MED ORDER — FUROSEMIDE 40 MG PO TABS
40.0000 mg | ORAL_TABLET | Freq: Two times a day (BID) | ORAL | Status: DC
  Administered 2017-09-27 – 2017-09-29 (×5): 40 mg via ORAL
  Filled 2017-09-27 (×5): qty 1

## 2017-09-27 NOTE — Progress Notes (Signed)
Preliminary notes--Bilateral lower extremities venous duplex exam completed. Negative for DVT.  Calf veins exam are limited due to patient's severe edema and cellulitis, not be able to apply adequate compression.  Limited approach could not exclude calf veins thrombosis.   Hongying Luz Mares (RDMS RVT) 09/27/17 9:29 AM

## 2017-09-27 NOTE — Telephone Encounter (Signed)
Patient seen at ED on 09/26/17

## 2017-09-27 NOTE — ED Notes (Signed)
Report given to [facility]  [id number].

## 2017-09-27 NOTE — Telephone Encounter (Signed)
Noted.  It appears he is going to be admitted to the hospital.

## 2017-09-27 NOTE — H&P (Signed)
History and Physical    [name] BJY:[id number] DOB: 07-10-1930 DOA: 09/26/2017  PCP: Leone Haven, MD Patient coming from: Home  I have personally briefly reviewed patient's old medical records in [place]  Chief Complaint: Leg swelling and redness  HPI: [name] is a 84 y.o. male with medical history significant for CAD/MI, ejection fraction, moderate tension, diet, chronic lymphedema bilateral lower extremities presents to the ED from home where he was sent by home health nursing due to worsening right lower extremity edema with progressive redness and fever.  Patient states that he was in his usual state of health at the time the nursing reports that he has felt well.  His only complaints are fullness and pain in his right ear, increased urinary frequency and foul-smelling urine.  Per report, he has been on Keflex due to concern for cellulitis of the bilateral lower extremities.  He does endorse pain, swelling and redness in the right leg.  He denies subjective fever, chills, myalgias.  He denies nausea, vomiting, abdominal pain.  ED Course: In the ED, patient febrile to 101, heart rate 79-80, blood pressure normotensive, saturating currently on room air.  Labs for WBC 14.3, Hgb 12.5, platelets 170, sodium 145, potassium 3.2, BUN 16, creatinine 0.91, glucose 106, T bili 3.2, remainder of LFTs normal limits.  Lactate initially elevated at 2.3.  Chest x-ray showed no acute findings.  U/a showed moderate leuks and positive nitrites.  Patient received vancomycin and Zosyn along with IV fluids and the ED, and blood and urine cultures were drawn.  Review of Systems: As per HPI otherwise 10 point review of systems negative.    Past Medical History:  Diagnosis Date  . Aortic atherosclerosis ([place]) 05/2013   Per TEE  . Arrhythmia   . Asthma   . CHF (congestive heart failure) ([place])   . Colon polyp   . Coronary artery disease    a. Nonobstructive by 2004 cath b. Nonobstructive by  2015 cath in setting of NSTEMI  . GERD (gastroesophageal reflux disease)   . Heart murmur   . Hiatal hernia   . Hyperlipidemia   . Hypertension   . Lymphedema   . Moderate aortic stenosis    a. echo 2013: EF 55%, nl wall motion, mild DD, mild LVH, trace MR, moderate AS with peak gradient of 55 mm Hg; b. TEE 05/2013: EF EF 60-65%, borderline LVH, mild MR/AI, mod AS with valve area 1.0-1.26; c. echo 10/2014: 55-60%, no RWMA, GR1DD, mod AS w/ mean gradient of 28 mm Hg     . Obesity   . Onychomycosis   . Osteoarthritis     Past Surgical History:  Procedure Laterality Date  . CARDIAC CATHETERIZATION  11/2002  . CARDIAC CATHETERIZATION  05/21/2013   showing 30% oLM, 30% D1, 20% mLCx, 30% pRCA; EF 60%, severe AS (mean gradient 28 mmHg, peak 26, area 0.9)  . CHOLECYSTECTOMY  2001  . TRANSESOPHAGEAL ECHOCARDIOGRAM  05/22/2013   Preserved EF, moderate AS (valve area by planimetry between 1.0-1.26 cm sq), moderate aortic arch and descending aortic atherosclerosis.   . TRANSTHORACIC ECHOCARDIOGRAM  05/19/2013   EF 60-65%, impaired diastolic function, mild LA dilatation, mild MR/AI/TR, mod AS, high normal RVSP (30.4 mmHg)     reports that he quit smoking about 36 years ago. His smoking use included cigarettes. He has a 30.00 pack-year smoking history. He has never used smokeless tobacco. He reports that he does not drink alcohol or use drugs.  Allergies  Allergen Reactions  . Aspirin Rash and Other (See Comments)    Reaction: Trouble breathing     Family History  Problem Relation Age of Onset  . Heart disease Mother   . Heart disease Father 43  . Stroke Brother     Prior to Admission medications   Medication Sig Start Date End Date Taking? Authorizing Provider  atorvastatin (LIPITOR) 80 MG tablet Take 1 tablet (80 mg total) by mouth daily. 06/01/17  Yes Leone Haven, MD  clopidogrel (PLAVIX) 75 MG tablet Take 1 tablet (75 mg total) by mouth daily. 06/01/17  Yes Leone Haven, MD    docusate sodium (COLACE) 100 MG capsule Take 2 capsules (200 mg total) by mouth 2 (two) times daily. 08/10/17  Yes Dustin Flock, MD  ferrous sulfate 325 (65 FE) MG EC tablet Take 1 tablet (325 mg total) by mouth 2 (two) times daily. 06/06/17  Yes Leone Haven, MD  furosemide (LASIX) 40 MG tablet Take 1 tablet (40 mg total) by mouth 2 (two) times daily. 08/10/17  Yes Dustin Flock, MD  hydrocerin (EUCERIN) CREA Apply 1 application topically 2 (two) times daily. Apply to bilateral LEs and feet twice daily after cleansing with house skin cleanser and gently patting dry. No not apply between toes. 08/10/17  Yes Dustin Flock, MD  lisinopril (PRINIVIL,ZESTRIL) 5 MG tablet Take 1 tablet (5 mg total) by mouth daily. 09/06/17  Yes Leone Haven, MD  metoprolol succinate (TOPROL-XL) 25 MG 24 hr tablet Take 1 tablet (25 mg total) by mouth daily. 06/01/17  Yes Leone Haven, MD  polyethylene glycol [facility] / GLYCOLAX) packet Take 17 g by mouth daily. 08/10/17  Yes Dustin Flock, MD  potassium chloride (K-DUR) 10 MEQ tablet Take 1 tablet (10 mEq total) by mouth daily. 06/01/17  Yes Leone Haven, MD    Physical Exam: Vitals:   09/26/17 1900 09/26/17 2022 09/26/17 2225 09/27/17 0012  BP:  (!) 115/48 (!) 116/52 (!) 114/50  Pulse:  81 79 73  Resp:  16 18 20   Temp:      TempSrc:      SpO2:  99% 98% 97%  Weight: 117 kg (258 lb)     Height: 5' 11"  (1.803 m)       Constitutional: NAD, calm, comfortable Eyes: PERRL, lids and conjunctivae normal ENMT: Mucous membranes are moist. Posterior pharynx clear of any exudate or lesions. Cerumen impaction in right ear without canal erythema, visualized portion of TM wnl, left TM wnl.  Neck: normal, supple, no masses Respiratory: clear to auscultation bilaterally, no wheezing, no crackles. Normal respiratory effort. Cardiovascular: Regular rate and rhythm, 3/6 crescendo decrescendo systolic murmur at LUSB with decrescendo diastolic murmur at  LLSB. 2+ pitting edema of BLE. 1+ pedal pulses. Abdomen: no tenderness, no masses palpated. Bowel sounds positive.  Musculoskeletal: no clubbing / cyanosis. RLE larger than left. Skin: changes of chronic lymphedema bilaterally with erythema and rubor of RLE as compared to left. Neurologic: CN 2-12 grossly intact. Sensation diminished in feet. Strength 5/5 in all 4.  Psychiatric: Normal judgment and insight. Alert and oriented x 3. Normal mood.   Labs on Admission: I have personally reviewed following labs and imaging studies  CBC: Recent Labs  Lab 09/21/17 0955 09/26/17 2015  WBC 5.9 14.3*  NEUTROABS  --  12.4*  HGB 13.0 12.5*  HCT 38.5* 38.8*  MCV 80.7 82.9  PLT 160.0 235   Basic Metabolic Panel: Recent Labs  Lab 09/26/17 2015  NA 143  K 3.2*  CL 105  CO2 26  GLUCOSE 106*  BUN 16  CREATININE 0.91  CALCIUM 8.8*   GFR: Estimated Creatinine Clearance: 78.6 mL/min (by C-G formula based on SCr of 0.91 mg/dL). Liver Function Tests: Recent Labs  Lab 09/26/17 2015  AST 22  ALT 19  ALKPHOS 73  BILITOT 3.2*  PROT 6.4*  ALBUMIN 3.0*   No results for input(s): LIPASE, AMYLASE in the last 168 hours. No results for input(s): AMMONIA in the last 168 hours. Coagulation Profile: No results for input(s): INR, PROTIME in the last 168 hours. Cardiac Enzymes: No results for input(s): CKTOTAL, CKMB, CKMBINDEX, TROPONINI in the last 168 hours. BNP (last 3 results) Recent Labs    10/04/16 1049  PROBNP 105.0*   HbA1C: No results for input(s): HGBA1C in the last 72 hours. CBG: Recent Labs  Lab 09/26/17 1901 09/26/17 2348  GLUCAP 118* 90   Lipid Profile: No results for input(s): CHOL, HDL, LDLCALC, TRIG, CHOLHDL, LDLDIRECT in the last 72 hours. Thyroid Function Tests: No results for input(s): TSH, T4TOTAL, FREET4, T3FREE, THYROIDAB in the last 72 hours. Anemia Panel: No results for input(s): VITAMINB12, FOLATE, FERRITIN, TIBC, IRON, RETICCTPCT in the last 72  hours. Urine analysis:    Component Value Date/Time   COLORURINE AMBER (A) 09/26/2017 2258   APPEARANCEUR HAZY (A) 09/26/2017 2258   LABSPEC 1.020 09/26/2017 2258   PHURINE 5.0 09/26/2017 2258   GLUCOSEU NEGATIVE 09/26/2017 2258   HGBUR MODERATE (A) 09/26/2017 2258   BILIRUBINUR NEGATIVE 09/26/2017 2258   KETONESUR NEGATIVE 09/26/2017 2258   PROTEINUR 30 (A) 09/26/2017 2258   NITRITE POSITIVE (A) 09/26/2017 2258   LEUKOCYTESUR MODERATE (A) 09/26/2017 2258    Radiological Exams on Admission: Dg Chest Port 1 View  Result Date: 09/26/2017 CLINICAL DATA:  complaints of cellulitis to the right leg with a fever of 101.2 today. Pt has had the cellulitis for the last year but has gotten progressively worse over the past week. Short of breath EXAM: PORTABLE CHEST 1 VIEW COMPARISON:  Chest x-ray dated 07/16/2016 FINDINGS: Heart size and mediastinal contours are stable. Both lungs remain clear. No pleural effusion or pneumothorax seen. Osseous structures about the chest are unremarkable. IMPRESSION: No active disease.  No evidence of pneumonia or pulmonary edema. Electronically Signed   By: Franki Cabot M.D.   On: 09/26/2017 19:39    Assessment/Plan Active Problems:   Sepsis ([place])  Sepsis 2/2 cellulitis of RLE Lactic acidosis Sepsis criteria met with fever and leukocytosis. He is non-toxic appearing. No streaking redness to suggest deep SSTI. Failed outpatient therapy with Keflex. Would also be concerned for DVT. Doubt myositis or osteomyelitis. - Vancomycin per Pharmacy consult - Monitor for improvement in redness - Continue gentle IVF - Follow up blood cultures - Obtain ESR, CRP, CK - Trend lactate to close - Obtain XR of RLE - Obtain Duplex U/S of RLE - Trend fever curve - Daily CBC - Wound care - Glycemic control  UTI, POA U/A with positive leuks and nitrites along with urinary frequency c/w UTI. - Start ceftriaxone - Follow up blood and urine cultures  Elevated LFTs,  cholestatic pattern TBili increased, possibly in setting of sepsis. No signs or sxs of cholecystitis or cholangitis. Possible medication effect? - Obtain RUQ U/S - Check hepatitis panel - Tentatively continue statin - Holding Keflex - Daily CMP, trend LFTS  CAD, HFpEF Moderate aortic insufficiency - Continue Plavix, Lipitor, lisinopril, Toprol - Holding Lasix - Cautious volume resuscitation  DVT prophylaxis: Lovenox Code Status: Full Disposition Plan: Home v SNF pending PT eval Consults called: None Admission status: Inpatient   Bennie Pierini MD [facility]  If 7PM-7AM, please contact night-coverage [email or web address] Password [facility]  09/27/2017, 12:26 AM

## 2017-09-27 NOTE — Progress Notes (Signed)
PROGRESS NOTE    [name]  OFB:[id number] DOB: Apr 10, 1931 DOA: 09/26/2017 PCP: Leone Haven, MD    Brief Narrative:   [name] is a 84 y.o. male with medical history significant for CAD/MI, ejection fraction, moderate tension, diet, chronic lymphedema bilateral lower extremities presents to the ED from home where he was sent by home health nursing due to worsening right lower extremity edema with progressive redness and fever. He was admitted for right lower extremity cellulitis, and he was also found to have UTI.    Assessment & Plan:   Active Problems:   Sepsis ([place])  Sepsis secondary to Urinary tract infection and right lower extremity cellulitis:  Improving.  Lactic acid normalized. Currently on broad spectrum antibiotics. Still febrile but leukocytosis improving.  Blood cultures and urine cultures pending. Venous duplex negative for DVT.  Wound care consulted .    Hypokalemia: replaced. Repeat in am.    Elevated total bilirubin, but normal liver function tests and normal alk phos: US abdomen not significant.  Pt denies any nausea, vomiting or abdominal pain.    CAD: Pt denies any chest pain or sob.  Resume home meds.    Diabetes mellitus: CBG (last 3)  Recent Labs    09/26/17 2348 09/27/17 0809 09/27/17 1144  GLUCAP 90 73 96   Resume SSI.    Hypertension:  Well controlled.     DVT prophylaxis:  Code Status: full code.  Family Communication: none at bedside.  Disposition Plan: pending clinical improvement.   Consultants:   None.    Procedures: venous duplex negative for DVT.    Antimicrobials: vancomycin and rocephin since admission.    Subjective:  Reports pain not well controlled.  Objective: Vitals:   09/27/17 1200 09/27/17 1215 09/27/17 1221 09/27/17 1300  BP: (!) 146/56   (!) 142/54  Pulse: 83 88 84 89  Resp: 18 (!) 22 20 (!) 23  Temp:      TempSrc:      SpO2: 98% 99% 100% 97%  Weight:      Height:         Intake/Output Summary (Last 24 hours) at 09/27/2017 1345 Last data filed at 09/27/2017 0730 Gross per 24 hour  Intake 1350.2 ml  Output -  Net 1350.2 ml   Filed Weights   09/26/17 1900  Weight: 117 kg (258 lb)    Examination:  General exam: Appears calm and comfortable  Respiratory system: Clear to auscultation. Respiratory effort normal. Cardiovascular system: S1 & S2 heard, RRR. No JVD, murmurs, rubs, gallops or clicks. No pedal edema. Gastrointestinal system: Abdomen is nondistended, soft and nontender. No organomegaly or masses felt. Normal bowel sounds heard. Central nervous system: Alert and oriented. No focal neurological deficits. Extremities: right lower extremity erythematous, swollen mre than the left. lymphedema present.  Skin: No rashes, lesions or ulcers Psychiatry: . Mood & affect appropriate.     Data Reviewed: I have personally reviewed following labs and imaging studies  CBC: Recent Labs  Lab 09/21/17 0955 09/26/17 2015 09/27/17 0753  WBC 5.9 14.3* 11.1*  NEUTROABS  --  12.4*  --   HGB 13.0 12.5* 12.5*  HCT 38.5* 38.8* 39.0  MCV 80.7 82.9 83.7  PLT 160.0 170 782*   Basic Metabolic Panel: Recent Labs  Lab 09/26/17 2015 09/27/17 0753  NA 143 139  K 3.2* 3.4*  CL 105 102  CO2 26 26  GLUCOSE 106* 96  BUN 16 16  CREATININE 0.91 0.76  CALCIUM 8.8* 8.2*  MG  --  1.8  PHOS  --  2.6   GFR: Estimated Creatinine Clearance: 89.4 mL/min (by C-G formula based on SCr of 0.76 mg/dL). Liver Function Tests: Recent Labs  Lab 09/26/17 2015 09/27/17 0753  AST 22 29  ALT 19 17  ALKPHOS 73 65  BILITOT 3.2* 3.0*  PROT 6.4* 6.1*  ALBUMIN 3.0* 2.6*   No results for input(s): LIPASE, AMYLASE in the last 168 hours. No results for input(s): AMMONIA in the last 168 hours. Coagulation Profile: No results for input(s): INR, PROTIME in the last 168 hours. Cardiac Enzymes: Recent Labs  Lab 09/27/17 0753  CKTOTAL 69   BNP (last 3 results) Recent  Labs    10/04/16 1049  PROBNP 105.0*   HbA1C: Recent Labs    09/27/17 0753  HGBA1C 5.5   CBG: Recent Labs  Lab 09/26/17 1901 09/26/17 2348 09/27/17 0809 09/27/17 1144  GLUCAP 118* 90 73 96   Lipid Profile: No results for input(s): CHOL, HDL, LDLCALC, TRIG, CHOLHDL, LDLDIRECT in the last 72 hours. Thyroid Function Tests: No results for input(s): TSH, T4TOTAL, FREET4, T3FREE, THYROIDAB in the last 72 hours. Anemia Panel: No results for input(s): VITAMINB12, FOLATE, FERRITIN, TIBC, IRON, RETICCTPCT in the last 72 hours. Sepsis Labs: Recent Labs  Lab 09/26/17 2024 09/27/17 0753  LATICACIDVEN 2.29* 1.9    Recent Results (from the past 240 hour(s))  Culture, blood (Routine X 2) w Reflex to ID Panel     Status: None (Preliminary result)   Collection Time: 09/26/17  8:16 PM  Result Value Ref Range Status   Specimen Description   Final    BLOOD LEFT HAND Performed at [facility] [address] [place], [place] [address]    Special Requests   Final    BOTTLES DRAWN AEROBIC AND ANAEROBIC Blood Culture results may not be optimal due to an inadequate volume of blood received in culture bottles Performed at [facility] [address] [place], [place] [address]    Culture   Final    NO GROWTH < 12 HOURS Performed at [facility] [address], [place], [place] [address]    Report Status PENDING  Incomplete  Culture, blood (Routine X 2) w Reflex to ID Panel     Status: None (Preliminary result)   Collection Time: 09/26/17  8:37 PM  Result Value Ref Range Status   Specimen Description   Final    BLOOD RIGHT FOREARM Performed at [facility] [address] [place], [place] [address]    Special Requests   Final    BOTTLES DRAWN AEROBIC AND ANAEROBIC Blood Culture results may not be optimal due to an inadequate volume of blood received in culture bottles Performed at [facility] [address] [place], [place] [address]    Culture   Final    NO GROWTH < 12 HOURS Performed at [facility] [address], [place], [place] [address]    Report Status PENDING  Incomplete         Radiology Studies: Dg Chest Port 1 View  Result Date: 09/26/2017 CLINICAL DATA:  complaints of cellulitis to the right leg with a fever of 101.2 today. Pt has had the cellulitis for the last year but has gotten progressively worse over the past week. Short of breath EXAM: PORTABLE CHEST 1 VIEW COMPARISON:  Chest x-ray dated 07/16/2016 FINDINGS: Heart size and mediastinal contours are stable. Both lungs  remain clear. No pleural effusion or pneumothorax seen. Osseous structures about the chest are unremarkable. IMPRESSION: No active disease.  No evidence of pneumonia or pulmonary edema. Electronically Signed   By: Franki Cabot M.D.   On: 09/26/2017 19:39   US Abdomen Limited Ruq  Result Date: 09/27/2017 CLINICAL DATA:  Elevated liver function studies. History of previous cholecystectomy. EXAM: ULTRASOUND ABDOMEN LIMITED RIGHT UPPER QUADRANT COMPARISON:  Report of a pre cholecystectomy abdominal ultrasound of Sep 23, 2003 FINDINGS: Gallbladder: The gallbladder is surgically absent. Common bile duct: Diameter: 3.5 mm Liver: The hepatic echotexture is normal. The surface contour is smooth. There is no focal mass nor ductal dilation. Portal vein is patent on color Doppler imaging with normal direction of blood flow towards the liver. IMPRESSION: No focal hepatic masses nor evidence of intrahepatic ductal dilation. Normal calibered common bile duct. Previous cholecystectomy. Electronically Signed   By: David  Martinique M.D.   On: 09/27/2017 09:45        Scheduled Meds: . atorvastatin  80 mg Oral Daily  . clopidogrel  75 mg Oral Daily  . docusate sodium  200 mg Oral BID  . furosemide  40 mg Oral BID  . insulin aspart  0-15 Units Subcutaneous TID WC  . insulin aspart  0-5 Units Subcutaneous QHS   . lisinopril  5 mg Oral Daily  . metoprolol succinate  25 mg Oral Daily  . polyethylene glycol  17 g Oral Daily   Continuous Infusions: . cefTRIAXone (ROCEPHIN)  IV Stopped (09/27/17 0730)  . [START ON 09/28/2017] vancomycin       LOS: 1 [place]    Time spent: 35 minutes     Hosie Poisson, MD [facility] Pager [phone]  If 7PM-7AM, please contact night-coverage [email or web address] Password [email or web address] 09/27/2017, 1:45 PM

## 2017-09-27 NOTE — ED Notes (Signed)
ED TO INPATIENT HANDOFF REPORT  Name/Age/Gender [name] 84 y.o. male  Code Status    Code Status Orders  (From admission, onward)        Start     Ordered   09/27/17 0734  Full code  Continuous     09/27/17 0734    Code Status History    Date Active Date Inactive Code Status Order ID Comments User Context   08/07/2017 0428 08/10/2017 1359 Full Code [id number]  Lance Coon, MD Inpatient   05/13/2016 0435 05/13/2016 1813 Full Code [id number]  Harrie Foreman, MD Inpatient   06/21/2015 0533 06/21/2015 1553 Full Code [id number]  Saundra Shelling, MD ED      Home/SNF/Other Home  Chief Complaint Leg pain, fever  Level of Care/Admitting Diagnosis ED Disposition    ED Disposition Condition [facility]: [facility] [[id number]]  Level of Care: Med-Surg [16]  Diagnosis: Sepsis [facility]) [[id number]]  Admitting Physician: Bennie Pierini [[id number]]  Attending Physician: Jonnie Finner, [place] [[id number]]  Estimated length of stay: past midnight tomorrow  Certification:: I certify this patient will need inpatient services for at least 2 midnights  PT Class (Do Not Modify): Inpatient [101]  PT Acc Code (Do Not Modify): Private [1]       Medical History Past Medical History:  Diagnosis Date  . Aortic atherosclerosis ([place]) 05/2013   Per TEE  . Arrhythmia   . Asthma   . CHF (congestive heart failure) ([place])   . Colon polyp   . Coronary artery disease    a. Nonobstructive by 2004 cath b. Nonobstructive by 2015 cath in setting of NSTEMI  . GERD (gastroesophageal reflux disease)   . Heart murmur   . Hiatal hernia   . Hyperlipidemia   . Hypertension   . Lymphedema   . Moderate aortic stenosis    a. echo 2013: EF 55%, nl wall motion, mild DD, mild LVH, trace MR, moderate AS with peak gradient of 55 mm Hg; b. TEE 05/2013: EF EF 60-65%, borderline LVH, mild MR/AI, mod AS with valve area 1.0-1.26; c. echo 10/2014: 55-60%, no RWMA, GR1DD, mod AS w/ mean  gradient of 28 mm Hg     . Obesity   . Onychomycosis   . Osteoarthritis     Allergies Allergies  Allergen Reactions  . Aspirin Rash and Other (See Comments)    Reaction: Trouble breathing     IV Location/Drains/Wounds Patient Lines/Drains/Airways Status   Active Line/Drains/Airways    Name:   Placement date:   Placement time:   Site:   Days:   Peripheral IV 09/26/17 Right Forearm   09/26/17    2033    Forearm   1          Labs/Imaging Results for orders placed or performed during the hospital encounter of 09/26/17 (from the past 48 hour(s))  CBG monitoring, ED     Status: Abnormal   Collection Time: 09/26/17  7:01 PM  Result Value Ref Range   Glucose-Capillary 118 (H) 65 - 99 mg/dL  CBC with Differential/Platelet     Status: Abnormal   Collection Time: 09/26/17  8:15 PM  Result Value Ref Range   WBC 14.3 (H) 4.0 - 10.5 K/uL   RBC 4.68 4.22 - 5.81 MIL/uL   Hemoglobin 12.5 (L) 13.0 - 17.0 g/dL   HCT 38.8 (L) 39.0 - 52.0 %   MCV 82.9 78.0 - 100.0 fL   MCH 26.7 26.0 - 34.0  pg   MCHC 32.2 30.0 - 36.0 g/dL   RDW 15.3 11.5 - 15.5 %   Platelets 170 150 - 400 K/uL   Neutrophils Relative % 87 %   Neutro Abs 12.4 (H) 1.7 - 7.7 K/uL   Lymphocytes Relative 6 %   Lymphs Abs 0.9 0.7 - 4.0 K/uL   Monocytes Relative 7 %   Monocytes Absolute 0.9 0.1 - 1.0 K/uL   Eosinophils Relative 0 %   Eosinophils Absolute 0.0 0.0 - 0.7 K/uL   Basophils Relative 0 %   Basophils Absolute 0.0 0.0 - 0.1 K/uL    Comment: Performed at [facility], [place] [address] [place], [place] [address]  Comprehensive metabolic panel     Status: Abnormal   Collection Time: 09/26/17  8:15 PM  Result Value Ref Range   Sodium 143 135 - 145 mmol/L   Potassium 3.2 (L) 3.5 - 5.1 mmol/L   Chloride 105 101 - 111 mmol/L   CO2 26 22 - 32 mmol/L   Glucose, Bld 106 (H) 65 - 99 mg/dL   BUN 16 6 - 20 mg/dL   Creatinine, Ser 0.91 0.61 - 1.24 mg/dL   Calcium 8.8 (L) 8.9 - 10.3 mg/dL   Total Protein  6.4 (L) 6.5 - 8.1 g/dL   Albumin 3.0 (L) 3.5 - 5.0 g/dL   AST 22 15 - 41 U/L   ALT 19 17 - 63 U/L   Alkaline Phosphatase 73 38 - 126 U/L   Total Bilirubin 3.2 (H) 0.3 - 1.2 mg/dL   GFR calc non Af Amer >60 >60 mL/min   GFR calc Af Amer >60 >60 mL/min    Comment: (NOTE) The eGFR has been calculated using the CKD EPI equation. This calculation has not been validated in all clinical situations. eGFR's persistently <60 mL/min signify possible Chronic Kidney Disease.    Anion gap 12 5 - 15    Comment: Performed at [facility], [place] [address], [place], [place] [address]  Culture, blood (Routine X 2) w Reflex to ID Panel     Status: None (Preliminary result)   Collection Time: 09/26/17  8:16 PM  Result Value Ref Range   Specimen Description      BLOOD LEFT HAND Performed at [facility] [address], [place], [place] [address]    Special Requests      BOTTLES DRAWN AEROBIC AND ANAEROBIC Blood Culture results may not be optimal due to an inadequate volume of blood received in culture bottles Performed at [facility], [place] [address], [place], [place] [address]    Culture      NO GROWTH < 12 HOURS Performed at [facility] [address], [place], [place] [address]    Report Status PENDING   I-Stat CG4 Lactic Acid, ED     Status: Abnormal   Collection Time: 09/26/17  8:24 PM  Result Value Ref Range   Lactic Acid, Venous 2.29 (HH) 0.5 - 1.9 mmol/L   Comment NOTIFIED PHYSICIAN   Culture, blood (Routine X 2) w Reflex to ID Panel     Status: None (Preliminary result)   Collection Time: 09/26/17  8:37 PM  Result Value Ref Range   Specimen Description      BLOOD RIGHT FOREARM Performed at [facility], [place] [address] [place], [place] [address]    Special Requests      BOTTLES DRAWN AEROBIC AND ANAEROBIC Blood Culture results may  not be optimal due to an inadequate volume of blood received in culture  bottles Performed at [facility] [address] [place], [place] [address]    Culture      NO GROWTH < 12 HOURS Performed at [facility] [address], [place], [place] [address]    Report Status PENDING   Urinalysis, Routine w reflex microscopic     Status: Abnormal   Collection Time: 09/26/17 10:58 PM  Result Value Ref Range   Color, Urine AMBER (A) YELLOW    Comment: BIOCHEMICALS MAY BE AFFECTED BY COLOR   APPearance HAZY (A) CLEAR   Specific Gravity, Urine 1.020 1.005 - 1.030   pH 5.0 5.0 - 8.0   Glucose, UA NEGATIVE NEGATIVE mg/dL   Hgb urine dipstick MODERATE (A) NEGATIVE   Bilirubin Urine NEGATIVE NEGATIVE   Ketones, ur NEGATIVE NEGATIVE mg/dL   Protein, ur 30 (A) NEGATIVE mg/dL   Nitrite POSITIVE (A) NEGATIVE   Leukocytes, UA MODERATE (A) NEGATIVE   RBC / HPF 21-50 0 - 5 RBC/hpf   WBC, UA >50 (H) 0 - 5 WBC/hpf   Bacteria, UA MANY (A) NONE SEEN   Squamous Epithelial / LPF 0-5 0 - 5   Mucus PRESENT    Budding Yeast PRESENT     Comment: Performed at [facility], [place] [address], [place], [place] [address]  CBG monitoring, ED     Status: None   Collection Time: 09/26/17 11:48 PM  Result Value Ref Range   Glucose-Capillary 90 65 - 99 mg/dL  Sedimentation rate     Status: Abnormal   Collection Time: 09/27/17  7:53 AM  Result Value Ref Range   Sed Rate 24 (H) 0 - 16 mm/hr    Comment: Performed at [facility], [place] [address] [place], [place] [address]  C-reactive protein     Status: Abnormal   Collection Time: 09/27/17  7:53 AM  Result Value Ref Range   CRP 13.8 (H) <1.0 mg/dL    Comment: Performed at [facility] [address], [place], [place] [address]  CK     Status: None   Collection Time: 09/27/17  7:53 AM  Result Value Ref Range   Total CK 69 49 - 397 U/L    Comment: Performed at [facility], [place] [address] [place], [place] [address]  Phosphorus     Status: None    Collection Time: 09/27/17  7:53 AM  Result Value Ref Range   Phosphorus 2.6 2.5 - 4.6 mg/dL    Comment: Performed at [facility], [place] [address], [place], [place] [address]  Magnesium     Status: None   Collection Time: 09/27/17  7:53 AM  Result Value Ref Range   Magnesium 1.8 1.7 - 2.4 mg/dL    Comment: Performed at [facility], [place] [address] [place], [place] [address]  Hemoglobin A1c     Status: None   Collection Time: 09/27/17  7:53 AM  Result Value Ref Range   Hgb A1c MFr Bld 5.5 4.8 - 5.6 %    Comment: (NOTE) Pre diabetes:          5.7%-6.4% Diabetes:              >6.4% Glycemic control for   <7.0% adults with diabetes    Mean Plasma Glucose 111.15 mg/dL    Comment: Performed at [facility]  [address] [place], [place] [address]  Comprehensive metabolic panel     Status: Abnormal   Collection Time: 09/27/17  7:53 AM  Result Value Ref Range   Sodium 139 135 - 145 mmol/L   Potassium 3.4 (L) 3.5 - 5.1 mmol/L   Chloride 102 101 - 111 mmol/L   CO2 26 22 - 32 mmol/L   Glucose, Bld 96 65 - 99 mg/dL   BUN 16 6 - 20 mg/dL   Creatinine, Ser 0.76 0.61 - 1.24 mg/dL   Calcium 8.2 (L) 8.9 - 10.3 mg/dL   Total Protein 6.1 (L) 6.5 - 8.1 g/dL   Albumin 2.6 (L) 3.5 - 5.0 g/dL   AST 29 15 - 41 U/L   ALT 17 17 - 63 U/L   Alkaline Phosphatase 65 38 - 126 U/L   Total Bilirubin 3.0 (H) 0.3 - 1.2 mg/dL   GFR calc non Af Amer >60 >60 mL/min   GFR calc Af Amer >60 >60 mL/min    Comment: (NOTE) The eGFR has been calculated using the CKD EPI equation. This calculation has not been validated in all clinical situations. eGFR's persistently <60 mL/min signify possible Chronic Kidney Disease.    Anion gap 11 5 - 15    Comment: Performed at [facility], [place] [address], [place], [place] [address]  CBC     Status: Abnormal   Collection Time: 09/27/17  7:53 AM  Result Value Ref Range   WBC 11.1 (H) 4.0 - 10.5 K/uL   RBC  4.66 4.22 - 5.81 MIL/uL   Hemoglobin 12.5 (L) 13.0 - 17.0 g/dL   HCT 39.0 39.0 - 52.0 %   MCV 83.7 78.0 - 100.0 fL   MCH 26.8 26.0 - 34.0 pg   MCHC 32.1 30.0 - 36.0 g/dL   RDW 15.3 11.5 - 15.5 %   Platelets 148 (L) 150 - 400 K/uL    Comment: Performed at [facility], [place] [address] [place], [place] [address]  Lactic acid, plasma     Status: None   Collection Time: 09/27/17  7:53 AM  Result Value Ref Range   Lactic Acid, Venous 1.9 0.5 - 1.9 mmol/L    Comment: Performed at [facility], [place] [address], [place], [place] [address]  CBG monitoring, ED     Status: None   Collection Time: 09/27/17  8:09 AM  Result Value Ref Range   Glucose-Capillary 73 65 - 99 mg/dL  CBG monitoring, ED     Status: None   Collection Time: 09/27/17 11:44 AM  Result Value Ref Range   Glucose-Capillary 96 65 - 99 mg/dL   Dg Chest Port 1 View  Result Date: 09/26/2017 CLINICAL DATA:  complaints of cellulitis to the right leg with a fever of 101.2 today. Pt has had the cellulitis for the last year but has gotten progressively worse over the past week. Short of breath EXAM: PORTABLE CHEST 1 VIEW COMPARISON:  Chest x-ray dated 07/16/2016 FINDINGS: Heart size and mediastinal contours are stable. Both lungs remain clear. No pleural effusion or pneumothorax seen. Osseous structures about the chest are unremarkable. IMPRESSION: No active disease.  No evidence of pneumonia or pulmonary edema. Electronically Signed   By: Franki Cabot M.D.   On: 09/26/2017 19:39   US Abdomen Limited Ruq  Result Date: 09/27/2017 CLINICAL DATA:  Elevated liver function studies. History of previous cholecystectomy. EXAM: ULTRASOUND ABDOMEN LIMITED RIGHT UPPER QUADRANT COMPARISON:  Report of a pre cholecystectomy abdominal ultrasound  of Sep 23, 2003 FINDINGS: Gallbladder: The gallbladder is surgically absent. Common bile duct: Diameter: 3.5 mm Liver: The hepatic echotexture is normal. The surface contour is  smooth. There is no focal mass nor ductal dilation. Portal vein is patent on color Doppler imaging with normal direction of blood flow towards the liver. IMPRESSION: No focal hepatic masses nor evidence of intrahepatic ductal dilation. Normal calibered common bile duct. Previous cholecystectomy. Electronically Signed   By: David  Martinique M.D.   On: 09/27/2017 09:45    Pending Labs Unresulted Labs (From admission, onward)   Start     Ordered   09/27/17 0735  Hepatitis panel, acute  Once,   R     09/27/17 0734   09/27/17 0734  Lactic acid, plasma  STAT Now then every 3 hours,   R     09/27/17 0734      Vitals/Pain Today's Vitals   09/27/17 1137 09/27/17 1200 09/27/17 1215 09/27/17 1221  BP: 129/61 (!) 146/56    Pulse: 85 83 88 84  Resp: (!) 21 18 (!) 22 20  Temp:      TempSrc:      SpO2: 98% 98% 99% 100%  Weight:      Height:      PainSc:        Isolation Precautions No active isolations  Medications Medications  atorvastatin (LIPITOR) tablet 80 mg (80 mg Oral Given 09/27/17 1022)  clopidogrel (PLAVIX) tablet 75 mg (75 mg Oral Given 09/27/17 1022)  docusate sodium (COLACE) capsule 200 mg (200 mg Oral Given 09/27/17 1021)  furosemide (LASIX) tablet 40 mg (40 mg Oral Given 09/27/17 0754)  lisinopril (PRINIVIL,ZESTRIL) tablet 5 mg (5 mg Oral Given 09/27/17 1022)  metoprolol succinate (TOPROL-XL) 24 hr tablet 25 mg (25 mg Oral Given 09/27/17 1023)  polyethylene glycol (MIRALAX / GLYCOLAX) packet 17 g (17 g Oral Given 09/27/17 1020)  acetaminophen (TYLENOL) tablet 650 mg (has no administration in time range)    Or  acetaminophen (TYLENOL) suppository 650 mg (has no administration in time range)  oxyCODONE (Oxy IR/ROXICODONE) immediate release tablet 5 mg (has no administration in time range)  ondansetron (ZOFRAN) tablet 4 mg (has no administration in time range)    Or  ondansetron (ZOFRAN) injection 4 mg (has no administration in time range)  insulin aspart (novoLOG) injection 0-15  Units (0 Units Subcutaneous Not Given 09/27/17 1150)  insulin aspart (novoLOG) injection 0-5 Units (has no administration in time range)  cefTRIAXone (ROCEPHIN) 2 g in sodium chloride 0.9 % 100 mL IVPB (0 g Intravenous Stopped 09/27/17 0730)  vancomycin (VANCOCIN) 1,500 mg in sodium chloride 0.9 % 500 mL IVPB (has no administration in time range)  sodium chloride 0.9 % bolus 500 mL (0 mLs Intravenous Stopped 09/26/17 2234)  piperacillin-tazobactam (ZOSYN) IVPB 3.375 g (0 g Intravenous Stopped 09/27/17 0056)  potassium chloride (KLOR-CON) packet 40 mEq (40 mEq Oral Given 09/27/17 0753)  vancomycin (VANCOCIN) 2,000 mg in sodium chloride 0.9 % 500 mL IVPB (0 mg Intravenous Stopped 09/27/17 0200)  lactated ringers bolus 500 mL (0 mLs Intravenous Stopped 09/27/17 0259)    Mobility non-ambulatory

## 2017-09-28 DIAGNOSIS — N390 Urinary tract infection, site not specified: Secondary | ICD-10-CM | POA: Diagnosis present

## 2017-09-28 DIAGNOSIS — A419 Sepsis, unspecified organism: Principal | ICD-10-CM

## 2017-09-28 DIAGNOSIS — E1159 Type 2 diabetes mellitus with other circulatory complications: Secondary | ICD-10-CM

## 2017-09-28 DIAGNOSIS — I10 Essential (primary) hypertension: Secondary | ICD-10-CM

## 2017-09-28 DIAGNOSIS — I5032 Chronic diastolic (congestive) heart failure: Secondary | ICD-10-CM

## 2017-09-28 DIAGNOSIS — L03115 Cellulitis of right lower limb: Secondary | ICD-10-CM

## 2017-09-28 LAB — BASIC METABOLIC PANEL
Anion gap: 10 (ref 5–15)
BUN: 17 mg/dL (ref 6–20)
CHLORIDE: 103 mmol/L (ref 101–111)
CO2: 27 mmol/L (ref 22–32)
CREATININE: 0.94 mg/dL (ref 0.61–1.24)
Calcium: 8.6 mg/dL — ABNORMAL LOW (ref 8.9–10.3)
GFR calc Af Amer: >60 mL/min (ref >60)
GFR calc non Af Amer: >60 mL/min (ref >60)
GLUCOSE: 99 mg/dL (ref 65–99)
Potassium: 3.2 mmol/L — ABNORMAL LOW (ref 3.5–5.1)
SODIUM: 140 mmol/L (ref 135–145)

## 2017-09-28 LAB — HEPATITIS PANEL, ACUTE
HEP B S AG: NEGATIVE
Hep A IgM: NEGATIVE
Hep B C IgM: NEGATIVE

## 2017-09-28 LAB — GLUCOSE, CAPILLARY
GLUCOSE-CAPILLARY: 91 mg/dL (ref 65–99)
GLUCOSE-CAPILLARY: 72 mg/dL (ref 65–99)
Glucose-Capillary: 89 mg/dL (ref 65–99)
Glucose-Capillary: 75 mg/dL (ref 65–99)

## 2017-09-28 LAB — MAGNESIUM: Magnesium: 1.9 mg/dL (ref 1.7–2.4)

## 2017-09-28 MED ORDER — POTASSIUM CHLORIDE CRYS ER 20 MEQ PO TBCR
40.0000 meq | EXTENDED_RELEASE_TABLET | ORAL | Status: AC
  Administered 2017-09-28 (×2): 40 meq via ORAL
  Filled 2017-09-28 (×2): qty 2

## 2017-09-28 MED ORDER — POTASSIUM CHLORIDE CRYS ER 20 MEQ PO TBCR
20.0000 meq | EXTENDED_RELEASE_TABLET | Freq: Every day | ORAL | Status: DC
  Administered 2017-09-29: 20 meq via ORAL
  Filled 2017-09-28: qty 1

## 2017-09-28 NOTE — Progress Notes (Signed)
PROGRESS NOTE    [name]  ZOX:[id number] DOB: 11/01/1930 DOA: 09/26/2017 PCP: Glori Luis, MD    Brief Narrative:  [name] a 84 y.o.malewith medical history significantforCAD/MI, ejection fraction, moderate tension, diet, chronic lymphedema bilateral lower extremities presents to the ED from home where he was sent by home health nursing due to worsening right lower extremity edema with progressive redness and fever. He was admitted for right lower extremity cellulitis, and he was also found to have UTI.       Assessment & Plan:   Principal Problem:   Sepsis (HCC) Active Problems:   Acute lower UTI   Cellulitis of leg, right   Hyperlipidemia   Diabetes mellitus (HCC)   Coronary artery disease, non-occlusive   Hypertension   Morbid obesity (HCC)   Varicose veins of both lower extremities with pain   Chronic diastolic heart failure (HCC)  1.  sepsis secondary to UTI and right lower extremity cellulitis Patient with clinical improvement.  Patient still with a fever however fever curve slowly trending down.  Leukocytosis improving.  Patient with some clinical improvement.  Blood cultures pending with no growth to date.  Urine cultures pending.  Continue empiric IV vancomycin and IV Rocephin for now.  If fever curve continues to trend down and continued improvement in leukocytosis could likely transition to oral antibiotics in the next 24 to 48 hours.  2.  UTI Urine cultures pending.  IV Rocephin.  3.  Hypokalemia Replete.  Place on K-Dur 20 milliequivalents daily.  4.  Coronary artery disease Stable.  Continue home regimen of Lipitor, Lasix, lisinopril, Toprol-XL, plavix.  5.  Hypertension Stable.  Continue lisinopril, Toprol-XL.  6.  Diabetes mellitus type 2 Hemoglobin A1c 5.5.  Continue sliding scale insulin.  7.  Chronic diastolic heart failure Stable.  Continue Lasix, lisinopril, Toprol-XL, Lipitor.  Outpatient follow-up with cardiology.   DVT  prophylaxis: Heparin Code Status: Full Family Communication: Updated patient and brother at bedside. Disposition Plan: Home with home health when clinically improved and on oral antibiotics and afebrile x24 hours.   Consultants:   None  Procedures:   Chest x-ray 09/26/2017  Lower extremity Dopplers 09/27/2017    Antimicrobials:   IV vancomycin 09/26/2017  IV Zosyn 09/26/2017>>>> 09/27/2017  IV Rocephin 09/27/2017   Subjective: Patient in bed.  Denies any chest pain or shortness of breath.  Feels right lower extremity cellulitis improving.  Objective: Vitals:   09/27/17 1357 09/27/17 2037 09/28/17 0423 09/28/17 0959  BP:  (!) 122/46 (!) 111/50 (!) 120/45  Pulse:  78 78 79  Resp:  18 20   Temp:  (!) 100.8 F (38.2 C) 99.4 F (37.4 C)   TempSrc:  Oral    SpO2:  97%  96%  Weight: 119.3 kg (263 lb 0.1 oz)  118 kg (260 lb 2.3 oz)   Height:  (1.803 m)       Intake/Output Summary (Last 24 hours) at 09/28/2017 1139 Last data filed at 09/28/2017 0600 Gross per 24 hour  Intake 240 ml  Output -  Net 240 ml   Filed Weights   09/26/17 1900 09/27/17 1357 09/28/17 0423  Weight: 117 kg (258 lb) 119.3 kg (263 lb 0.1 oz) 118 kg (260 lb 2.3 oz)    Examination:  General exam: Appears calm and comfortable  Respiratory system: Clear to auscultation. Respiratory effort normal. Cardiovascular system: S1 & S2 heard, RRR. No JVD, murmurs, rubs, gallops or clicks. No pedal edema. Gastrointestinal system: Abdomen is  nondistended, soft and nontender. No organomegaly or masses felt. Normal bowel sounds heard. Central nervous system: Alert and oriented. No focal neurological deficits. Extremities: Symmetric 5 x 5 power. Skin: Bilateral chronic venous stasis changes noted.  Decreased erythema right lower extremity, decreased warmth.  Lymphedema. Psychiatry: Judgement and insight appear normal. Mood & affect appropriate.     Data Reviewed: I have personally reviewed following labs  and imaging studies  CBC: Recent Labs  Lab 09/26/17 2015 09/27/17 0753  WBC 14.3* 11.1*  NEUTROABS 12.4*  --   HGB 12.5* 12.5*  HCT 38.8* 39.0  MCV 82.9 83.7  PLT 170 148*   Basic Metabolic Panel: Recent Labs  Lab 09/26/17 2015 09/27/17 0753 09/28/17 0439  NA 143 139 140  K 3.2* 3.4* 3.2*  CL 105 102 103  CO2 GLUCOSE 106* 96 99  BUN CREATININE 0.91 0.76 0.94  CALCIUM 8.8* 8.2* 8.6*  MG  --  1.8 1.9  PHOS  --  2.6  --    GFR: Estimated Creatinine Clearance: 76.5 mL/min (by C-G formula based on SCr of 0.94 mg/dL). Liver Function Tests: Recent Labs  Lab 09/26/17 2015 09/27/17 0753  AST 22 29  ALT 19 17  ALKPHOS 73 65  BILITOT 3.2* 3.0*  PROT 6.4* 6.1*  ALBUMIN 3.0* 2.6*   No results for input(s): LIPASE, AMYLASE in the last 168 hours. No results for input(s): AMMONIA in the last 168 hours. Coagulation Profile: No results for input(s): INR, PROTIME in the last 168 hours. Cardiac Enzymes: Recent Labs  Lab 09/27/17 0753  CKTOTAL 69   BNP (last 3 results) Recent Labs    10/04/16 1049  PROBNP 105.0*   HbA1C: Recent Labs    09/27/17 0753  HGBA1C 5.5   CBG: Recent Labs  Lab 09/27/17 0809 09/27/17 1144 09/27/17 1641 09/27/17 2104 09/28/17 0728  GLUCAP 73 96 108* 80 89   Lipid Profile: No results for input(s): CHOL, HDL, LDLCALC, TRIG, CHOLHDL, LDLDIRECT in the last 72 hours. Thyroid Function Tests: No results for input(s): TSH, T4TOTAL, FREET4, T3FREE, THYROIDAB in the last 72 hours. Anemia Panel: No results for input(s): VITAMINB12, FOLATE, FERRITIN, TIBC, IRON, RETICCTPCT in the last 72 hours. Sepsis Labs: Recent Labs  Lab 09/26/17 2024 09/27/17 0753 09/27/17 1427  LATICACIDVEN 2.29* 1.9 1.9    Recent Results (from the past 240 hour(s))  Culture, blood (Routine X 2) w Reflex to ID Panel     Status: None (Preliminary result)   Collection Time: 09/26/17  8:16 PM  Result Value Ref Range Status   Specimen  Description   Final    BLOOD LEFT HAND Performed at [facility], [address], [place], [place] [address]    Special Requests   Final    BOTTLES DRAWN AEROBIC AND ANAEROBIC Blood Culture results may not be optimal due to an inadequate volume of blood received in culture bottles Performed at [facility], [address] [place], [place] [address]    Culture   Final    NO GROWTH < 12 HOURS Performed at [facility], [address] [place], [place] [address]    Report Status PENDING  Incomplete  Culture, blood (Routine X 2) w Reflex to ID Panel     Status: None (Preliminary result)   Collection Time: 09/26/17  8:37 PM  Result Value Ref Range Status   Specimen Description   Final    BLOOD RIGHT FOREARM  Performed at [facility], [address], [place], [place] [address]    Special Requests   Final    BOTTLES DRAWN AEROBIC AND ANAEROBIC Blood Culture results may not be optimal due to an inadequate volume of blood received in culture bottles Performed at [facility], [address], [place], [place] [address]    Culture   Final    NO GROWTH < 12 HOURS Performed at [facility], [address], [place], [place] [address]    Report Status PENDING  Incomplete         Radiology Studies: Dg Chest Port 1 View  Result Date: 09/26/2017 CLINICAL DATA:  complaints of cellulitis to the right leg with a fever of 101.2 today. Pt has had the cellulitis for the last year but has gotten progressively worse over the past week. Short of breath EXAM: PORTABLE CHEST 1 VIEW COMPARISON:  Chest x-ray dated 07/16/2016 FINDINGS: Heart size and mediastinal contours are stable. Both lungs remain clear. No pleural effusion or pneumothorax seen. Osseous structures about the chest are unremarkable. IMPRESSION: No active disease.  No evidence of pneumonia or pulmonary edema. Electronically Signed   By: Bary Richard M.D.    On: 09/26/2017 19:39   US Abdomen Limited Ruq  Result Date: 09/27/2017 CLINICAL DATA:  Elevated liver function studies. History of previous cholecystectomy. EXAM: ULTRASOUND ABDOMEN LIMITED RIGHT UPPER QUADRANT COMPARISON:  Report of a pre cholecystectomy abdominal ultrasound of Sep 23, 2003 FINDINGS: Gallbladder: The gallbladder is surgically absent. Common bile duct: Diameter: 3.5 mm Liver: The hepatic echotexture is normal. The surface contour is smooth. There is no focal mass nor ductal dilation. Portal vein is patent on color Doppler imaging with normal direction of blood flow towards the liver. IMPRESSION: No focal hepatic masses nor evidence of intrahepatic ductal dilation. Normal calibered common bile duct. Previous cholecystectomy. Electronically Signed   By: David  [place] M.D.   On: 09/27/2017 09:45        Scheduled Meds: . atorvastatin  80 mg Oral Daily  . clopidogrel  75 mg Oral Daily  . docusate sodium  200 mg Oral BID  . furosemide  40 mg Oral BID  . heparin injection (subcutaneous)  5,000 Units Subcutaneous Q8H  . insulin aspart  0-15 Units Subcutaneous TID WC  . insulin aspart  0-5 Units Subcutaneous QHS  . lisinopril  5 mg Oral Daily  . metoprolol succinate  25 mg Oral Daily  . polyethylene glycol  17 g Oral Daily  . [START ON 09/29/2017] potassium chloride  20 mEq Oral Daily  . potassium chloride  40 mEq Oral Q4H   Continuous Infusions: . cefTRIAXone (ROCEPHIN)  IV 2 g (09/28/17 0625)  . vancomycin 1,500 mg (09/28/17 0900)     LOS: 2 days    Time spent: 35 minutes    Ramiro Harvest, MD [facility] Pager [phone]  If 7PM-7AM, please contact night-coverage [email or web address] Password [facility] 09/28/2017, 11:39 AM

## 2017-09-28 NOTE — Evaluation (Addendum)
Physical Therapy Evaluation Patient Details Name: [name] MRN: [id number] DOB: [birth year] Today's Date: 09/28/2017   History of Present Illness  84 y.o.malewith medical history significantforCAD/MI, ejection fraction, moderate tension, diet, chronic lymphedema bilateral lower extremities presents to the ED from home where he was sent by home health nursing due to worsening right lower extremity edema with progressive redness and fever. He was admitted for right lower extremity cellulitis, and he was also found to have UTI.   Clinical Impression  Pt admitted with above diagnosis. Pt currently with functional limitations due to the deficits listed below (see PT Problem List). Mod assist for supine to sit, min assist sit to stand, pt ambulated 56' with RW and min/guard assist. 2/4 dyspnea with ambulation, SaO2 100% on room air, HR 70, pt denied shortness of breath. Pt lives alone but can DC to his brother's home if needed.  Pt will benefit from skilled PT to increase their independence and safety with mobility to allow discharge to the venue listed below.       Follow Up Recommendations Home health PT;Supervision for mobility/OOB    Equipment Recommendations  None recommended by PT    Recommendations for Other Services       Precautions / Restrictions Precautions Precautions: Fall Precaution Comments: pt denies h/o falls in past 1 year Restrictions Weight Bearing Restrictions: No      Mobility  Bed Mobility Overal bed mobility: Needs Assistance Bed Mobility: Supine to Sit     Supine to sit: Mod assist     General bed mobility comments: mod A to raise trunk  Transfers Overall transfer level: Needs assistance Equipment used: Rolling walker (2 wheeled) Transfers: Sit to/from Stand Sit to Stand: Min assist         General transfer comment: min A to rise  Ambulation/Gait Ambulation/Gait assistance: Min guard Ambulation Distance (Feet): 56 Feet Assistive device:  Rolling walker (2 wheeled) Gait Pattern/deviations: Step-through pattern;Decreased stride length Gait velocity: decr   General Gait Details: 2/4 dyspnea but pt denies SOB, SaO2 100% room air, HR 70, no loss of balance, distance limited by fatigue  Stairs            Wheelchair Mobility    Modified Rankin (Stroke Patients Only)       Balance Overall balance assessment: Needs assistance   Sitting balance-Leahy Scale: Good       Standing balance-Leahy Scale: Fair                               Pertinent Vitals/Pain Pain Assessment: No/denies pain    Home Living Family/patient expects to be discharged to:: Private residence Living Arrangements: Alone Available Help at Discharge: Family;Available PRN/intermittently Type of Home: House Home Access: Level entry     Home Layout: Two level;Able to live on main level with bedroom/bathroom Home Equipment: Dan Humphreys - 2 wheels;Walker - standard;Cane - single point      Prior Function Level of Independence: Independent with assistive device(s)         Comments: walks with cane at baseline, lives alone but stated he can stay with his brother if he needs help     Hand Dominance   Dominant Hand: Right    Extremity/Trunk Assessment   Upper Extremity Assessment Upper Extremity Assessment: Generalized weakness    Lower Extremity Assessment Lower Extremity Assessment: Generalized weakness    Cervical / Trunk Assessment Cervical / Trunk Assessment: Normal  Communication  Communication: HOH  Cognition Arousal/Alertness: Awake/alert Behavior During Therapy: WFL for tasks assessed/performed Overall Cognitive Status: Within Functional Limits for tasks assessed                                        General Comments      Exercises     Assessment/Plan    PT Assessment Patient needs continued PT services  PT Problem List Decreased mobility;Decreased activity tolerance;Decreased  balance       PT Treatment Interventions Gait training;Functional mobility training;Therapeutic activities;Therapeutic exercise;Balance training;Patient/family education    PT Goals (Current goals can be found in the Care Plan section)  Acute Rehab PT Goals Patient Stated Goal: DC home PT Goal Formulation: With patient Time For Goal Achievement: 10/12/17 Potential to Achieve Goals: Good    Frequency Min 3X/week   Barriers to discharge        Co-evaluation               AM-PAC PT "6 Clicks" Daily Activity  Outcome Measure Difficulty turning over in bed (including adjusting bedclothes, sheets and blankets)?: Unable Difficulty moving from lying on back to sitting on the side of the bed? : Unable Difficulty sitting down on and standing up from a chair with arms (e.g., wheelchair, bedside commode, etc,.)?: A Lot Help needed moving to and from a bed to chair (including a wheelchair)?: A Little Help needed walking in hospital room?: A Little Help needed climbing 3-5 steps with a railing? : A Lot 6 Click Score: 12    End of Session Equipment Utilized During Treatment: Gait belt Activity Tolerance: Patient tolerated treatment well Patient left: in chair;with chair alarm set;with call bell/phone within reach Nurse Communication: Mobility status PT Visit Diagnosis: Difficulty in walking, not elsewhere classified (R26.2)    Time: 1432-1450 PT Time Calculation (min) (ACUTE ONLY): 18 min   Charges:   PT Evaluation $PT Eval Low Complexity: 1 Low     PT G Codes:          Tamala Ser 09/28/2017, 2:58 PM [phone]

## 2017-09-29 DIAGNOSIS — A4151 Sepsis due to Escherichia coli [E. coli]: Secondary | ICD-10-CM

## 2017-09-29 DIAGNOSIS — B9620 Unspecified Escherichia coli [E. coli] as the cause of diseases classified elsewhere: Secondary | ICD-10-CM | POA: Diagnosis present

## 2017-09-29 DIAGNOSIS — N390 Urinary tract infection, site not specified: Secondary | ICD-10-CM

## 2017-09-29 DIAGNOSIS — I2510 Atherosclerotic heart disease of native coronary artery without angina pectoris: Secondary | ICD-10-CM

## 2017-09-29 LAB — CBC WITH DIFFERENTIAL/PLATELET
BASOS PCT: 0 %
Basophils Absolute: 0 10*3/uL (ref 0.0–0.1)
Eosinophils Absolute: 0.1 10*3/uL (ref 0.0–0.7)
Eosinophils Relative: 2 %
HEMATOCRIT: 34.4 % — AB (ref 39.0–52.0)
Hemoglobin: 11.2 g/dL — ABNORMAL LOW (ref 13.0–17.0)
LYMPHS PCT: 21 %
Lymphs Abs: 1.1 10*3/uL (ref 0.7–4.0)
MCH: 26.7 pg (ref 26.0–34.0)
MCHC: 32.6 g/dL (ref 30.0–36.0)
MCV: 81.9 fL (ref 78.0–100.0)
Monocytes Absolute: 0.4 10*3/uL (ref 0.1–1.0)
Monocytes Relative: 8 %
NEUTROS ABS: 3.5 10*3/uL (ref 1.7–7.7)
Neutrophils Relative %: 69 %
PLATELETS: 142 10*3/uL — AB (ref 150–400)
RBC: 4.2 MIL/uL — AB (ref 4.22–5.81)
RDW: 15.3 % (ref 11.5–15.5)
WBC: 5.1 10*3/uL (ref 4.0–10.5)

## 2017-09-29 LAB — BASIC METABOLIC PANEL
ANION GAP: 8 (ref 5–15)
BUN: 17 mg/dL (ref 6–20)
CALCIUM: 7.8 mg/dL — AB (ref 8.9–10.3)
CO2: 26 mmol/L (ref 22–32)
Chloride: 102 mmol/L (ref 101–111)
Creatinine, Ser: 0.86 mg/dL (ref 0.61–1.24)
Glucose, Bld: 92 mg/dL (ref 65–99)
POTASSIUM: 4 mmol/L (ref 3.5–5.1)
SODIUM: 136 mmol/L (ref 135–145)

## 2017-09-29 LAB — GLUCOSE, CAPILLARY
GLUCOSE-CAPILLARY: 108 mg/dL — AB (ref 65–99)
GLUCOSE-CAPILLARY: 102 mg/dL — AB (ref 65–99)
Glucose-Capillary: 105 mg/dL — ABNORMAL HIGH (ref 65–99)

## 2017-09-29 MED ORDER — CEPHALEXIN 500 MG PO CAPS: 500.0000 mg | ORAL_CAPSULE | Freq: Three times a day (TID) | ORAL | 0 refills | Status: AC

## 2017-09-29 MED ORDER — ACETAMINOPHEN 325 MG PO TABS: 650.0000 mg | ORAL_TABLET | Freq: Four times a day (QID) | ORAL | Status: DC | PRN

## 2017-09-29 MED ORDER — DOXYCYCLINE HYCLATE 100 MG PO TABS
100.0000 mg | ORAL_TABLET | Freq: Two times a day (BID) | ORAL | Status: DC
  Administered 2017-09-29: 100 mg via ORAL
  Filled 2017-09-29: qty 1

## 2017-09-29 MED ORDER — DOXYCYCLINE HYCLATE 100 MG PO TABS: 100.0000 mg | ORAL_TABLET | Freq: Two times a day (BID) | ORAL | 0 refills | Status: AC

## 2017-09-29 NOTE — Evaluation (Signed)
Occupational Therapy Evaluation Patient Details Name: [name] MRN: [id number] DOB: 1930-06-27 Today's Date: 09/29/2017    History of Present Illness 84 y.o.malewith medical history significantforCAD/MI, ejection fraction, moderate tension, diet, chronic lymphedema bilateral lower extremities presents to the ED from home where he was sent by home health nursing due to worsening right lower extremity edema with progressive redness and fever. He was admitted for right lower extremity cellulitis, and he was also found to have UTI.    Clinical Impression   Pt admitted with UTI. Pt currently with functional limitations due to the deficits listed below (see OT Problem List).  Pt will benefit from skilled OT to increase their safety and independence with ADL and functional mobility for ADL to facilitate discharge to venue listed below.      Follow Up Recommendations  Home health OT;Supervision/Assistance - 24 hour    Equipment Recommendations  None recommended by OT    Recommendations for Other Services       Precautions / Restrictions Precautions Precautions: Fall Precaution Comments: pt denies h/o falls in past 1 year Restrictions Weight Bearing Restrictions: No      Mobility Bed Mobility               General bed mobility comments: OOB in recliner   Transfers Overall transfer level: Needs assistance Equipment used: Rolling walker (2 wheeled) Transfers: Sit to/from [employer] Sit to Stand: Supervision;Min guard Stand pivot transfers: Supervision;Min guard       General transfer comment: good use of hands to steady self     Balance                                           ADL either performed or assessed with clinical judgement   ADL Overall ADL's : Needs assistance/impaired Eating/Feeding: Set up;Sitting   Grooming: Min guard;Standing   Upper Body Bathing: Minimal assistance;Sitting   Lower Body Bathing: Moderate  assistance;Sit to/from stand   Upper Body Dressing : Minimal assistance;Sitting   Lower Body Dressing: Moderate assistance;Sit to/from stand   Toilet Transfer: RW;Minimal assistance;Moderate assistance;Stand-pivot   Toileting- Clothing Manipulation and Hygiene: Moderate assistance;Sit to/from stand               Vision Patient Visual Report: No change from baseline       Perception     Praxis      Pertinent Vitals/Pain Pain Assessment: No/denies pain     Hand Dominance Right   Extremity/Trunk Assessment Upper Extremity Assessment Upper Extremity Assessment: Generalized weakness       Cervical / Trunk Assessment Cervical / Trunk Assessment: Normal   Communication Communication Communication: HOH   Cognition Arousal/Alertness: Awake/alert Behavior During Therapy: WFL for tasks assessed/performed Overall Cognitive Status: Within Functional Limits for tasks assessed                                     General Comments   pt feels brother will be able to provide care at home. Pt does not want to go to a SNF.            Home Living Family/patient expects to be discharged to:: Private residence Living Arrangements: Alone Available Help at Discharge: Family;Available PRN/intermittently Type of Home: House Home Access: Level entry     Home Layout: Two  level;Able to live on main level with bedroom/bathroom               Home Equipment: Dan Humphreys - 2 wheels;Walker - standard;Cane - single point          Prior Functioning/Environment Level of Independence: Independent with assistive device(s)        Comments: walks with cane at baseline, lives alone but stated he can stay with his brother if he needs help        OT Problem List: Decreased strength;Decreased activity tolerance;Impaired balance (sitting and/or standing)      OT Treatment/Interventions: Self-care/ADL training;Patient/family education;DME and/or AE  instruction;Therapeutic activities    OT Goals(Current goals can be found in the care plan section)    OT Frequency: Min 2X/week   Barriers to D/C:            Co-evaluation              AM-PAC PT "6 Clicks" Daily Activity     Outcome Measure Help from another person eating meals?: None Help from another person taking care of personal grooming?: A Little Help from another person toileting, which includes using toliet, bedpan, or urinal?: A Little Help from another person bathing (including washing, rinsing, drying)?: A Lot Help from another person to put on and taking off regular upper body clothing?: A Little Help from another person to put on and taking off regular lower body clothing?: A Lot 6 Click Score: 17   End of Session Nurse Communication: Mobility status  Activity Tolerance: Patient tolerated treatment well Patient left: in chair  OT Visit Diagnosis: Unsteadiness on feet (R26.81)                Time: 1478-2956 OT Time Calculation (min): 13 min Charges:  OT General Charges $OT Visit: 1 Visit OT Evaluation $OT Eval Moderate Complexity: 1 Mod G-Codes:     Lise Auer, OT [phone]  Einar Crow D 09/29/2017, 1:29 PM

## 2017-09-29 NOTE — Care Management Important Message (Signed)
Important Message  Patient Details  Name: [name] MRN: [id number] Date of Birth: Jun 21, 1930   Medicare Important Message Given:  Yes    Caren Macadam 09/29/2017, 11:44 AMImportant Message  Patient Details  Name: [name] MRN: [id number] Date of Birth: 10-15-1930   Medicare Important Message Given:  Yes    Caren Macadam 09/29/2017, 11:44 AM

## 2017-09-29 NOTE — Plan of Care (Signed)
Discharge instructions reviewed with patient, questions answered, verbalized understanding.  Went over all medications with patient, last time administered and next dose due.  Hard copy prescriptions given to patient for antibiotics.  Patient awaiting ride from brother.

## 2017-09-29 NOTE — Discharge Summary (Signed)
Physician Discharge Summary  [name] BJY:[id number] DOB: 12-29-1930 DOA: 09/26/2017  PCP: Jeffery Luis, MD  Admit date: 09/26/2017 Discharge date: 09/29/2017  Time spent: 55 minutes  Recommendations for Outpatient Follow-up:  1. Patient will be discharged home with home health therapies. 2. Follow-up with Jeffery Luis, MD 1 to 2 weeks.  On follow-up patient will need a basic metabolic profile done to follow-up on electrolytes and renal function.  Sensitivities from urine cultures will need to be followed up upon.  Lower extremity cellulitis also need to be followed up upon. 3. Follow-up with Dr. Mariah [name] of cardiology as scheduled.   Discharge Diagnoses:  Principal Problem:   Sepsis (HCC) Active Problems:   Acute lower UTI   Cellulitis of leg, right   Hyperlipidemia   Diabetes mellitus (HCC)   Coronary artery disease, non-occlusive   Hypertension   Morbid obesity (HCC)   Varicose veins of both lower extremities with pain   Chronic diastolic heart failure (HCC)   Cellulitis of right leg   E. coli UTI   Discharge Condition: Stable and improved  Diet recommendation: Heart healthy  Filed Weights   09/27/17 1357 09/28/17 0423 09/29/17 0500  Weight: 119.3 kg (263 lb 0.1 oz) 118 kg (260 lb 2.3 oz) 114.4 kg (252 lb 3.3 oz)    History of present illness:  Per Dr [name] is a 84 y.o. male with medical history significant for CAD/MI, ejection fraction, moderate aortic stenosis, chronic lymphedema bilateral lower extremities presented to the ED from home where he was sent by home health nursing due to worsening right lower extremity edema with progressive redness and fever.  Patient stated that he was in his usual state of health at the time the nursing reported that he had felt well.  His only complaints are fullness and pain in his right ear, increased urinary frequency and foul-smelling urine.  Per report, he had been on Keflex due to concern for cellulitis of  the bilateral lower extremities.  He did endorse pain, swelling and redness in the right leg.  He denied subjective fever, chills, myalgias.  He denied nausea, vomiting, abdominal pain.  ED Course: In the ED, patient febrile to 101, heart rate 79-80, blood pressure normotensive, saturating currently on room air.  Labs for WBC 14.3, Hgb 12.5, platelets 170, sodium 145, potassium 3.2, BUN 16, creatinine 0.91, glucose 106, T bili 3.2, remainder of LFTs normal limits.  Lactate initially elevated at 2.3.  Chest x-ray showed no acute findings.  U/a showed moderate leuks and positive nitrites.  Patient received vancomycin and Zosyn along with IV fluids and the ED, and blood and urine cultures were drawn.     Hospital Course:  1.  sepsis secondary to UTI and right lower extremity cellulitis Patient presented with symptoms of sepsis felt secondary to right lower extremity cellulitis and UTI.  Patient was admitted noted to have a leukocytosis and with a fever.  Patient placed initially empirically on IV vancomycin and IV Zosyn.  Patient was pancultured.  Blood cultures with no growth to date.  Will treat with greater than 100,000 colonies of E. coli.  IV Zosyn was subsequently narrowed to IV Rocephin.  Lower extremity cellulitis improved on a daily basis.  Fever curve trended down.  Leukocytosis improved.  IV vancomycin was subsequently discontinued and patient started on doxycycline.  Patient will be discharged home on 4 more days of oral doxycycline and Keflex to complete a one-week course of antibiotic treatment.  Outpatient follow-up with PCP.    2.    E. coli UTI Patient had presented with urinary symptoms.  Urinalysis which was done was worrisome for UTI.  Patient was placed empirically on IV Zosyn initially and antibiotics narrowed down to IV Rocephin.  Urine cultures grew greater than 100,000 colonies of E. coli sensitivities were pending at time of discharge.  Patient improved clinically.  Patient will  be discharged home on 4 more days of oral Keflex to complete a one-week course of antibiotic treatment for both his E. coli and lower extremity cellulitis.    3.  Hypokalemia Repleted.  Patient will be resumed back on his home regimen of oral potassium supplementation.  Outpatient follow-up with PCP.  4.  Coronary artery disease Stable throughout the hospitalization.  Patient was maintained on his cardiac medications.    5.  Hypertension Complaint on home regimen of lisinopril, Toprol-XL, Lasix.   6.  Diabetes mellitus type 2 Hemoglobin A1c 5.5.  Patient maintained on sliding scale insulin.   7.  Chronic diastolic heart failure Remained stable throughout the hospitalization.  Patient maintained on his home regimen of Lasix, lisinopril, Toprol-XL, Lipitor.  Outpatient follow-up with cardiology.     Procedures:  Chest x-ray 09/26/2017  Lower extremity Dopplers 09/27/2017      Consultations:  None  Discharge Exam: Vitals:   09/29/17 0950 09/29/17 1451  BP: (!) 106/51 119/60  Pulse: 80 71  Resp:  20  Temp: 99.1 F (37.3 C) 98.5 F (36.9 C)  SpO2: 100% 98%    General: NAD Cardiovascular: RRR Respiratory: CTAB  Discharge Instructions   Discharge Instructions    Diet - low sodium heart healthy   Complete by:  As directed    Increase activity slowly   Complete by:  As directed      Allergies as of 09/29/2017      Reactions   Aspirin Rash, Other (See Comments)   Reaction: Trouble breathing      Medication List    TAKE these medications   acetaminophen 325 MG tablet Commonly known as:  TYLENOL Take 2 tablets (650 mg total) by mouth every 6 (six) hours as needed for mild pain (or Fever >/= 101).   atorvastatin 80 MG tablet Commonly known as:  LIPITOR Take 1 tablet (80 mg total) by mouth daily.   cephALEXin 500 MG capsule Commonly known as:  KEFLEX Take 1 capsule (500 mg total) by mouth 3 (three) times daily for 4 days.   clopidogrel 75 MG  tablet Commonly known as:  PLAVIX Take 1 tablet (75 mg total) by mouth daily.   docusate sodium 100 MG capsule Commonly known as:  COLACE Take 2 capsules (200 mg total) by mouth 2 (two) times daily.   doxycycline 100 MG tablet Commonly known as:  VIBRA-TABS Take 1 tablet (100 mg total) by mouth every 12 (twelve) hours for 4 days.   ferrous sulfate 325 (65 FE) MG EC tablet Take 1 tablet (325 mg total) by mouth 2 (two) times daily.   furosemide 40 MG tablet Commonly known as:  LASIX Take 1 tablet (40 mg total) by mouth 2 (two) times daily.   hydrocerin Crea Apply 1 application topically 2 (two) times daily. Apply to bilateral LEs and feet twice daily after cleansing with house skin cleanser and gently patting dry. No not apply between toes.   lisinopril 5 MG tablet Commonly known as:  PRINIVIL,ZESTRIL Take 1 tablet (5 mg total) by mouth daily.   metoprolol  succinate 25 MG 24 hr tablet Commonly known as:  TOPROL-XL Take 1 tablet (25 mg total) by mouth daily.   polyethylene glycol packet Commonly known as:  MIRALAX / GLYCOLAX Take 17 g by mouth daily.   potassium chloride 10 MEQ tablet Commonly known as:  K-DUR Take 1 tablet (10 mEq total) by mouth daily.      Allergies  Allergen Reactions  . Aspirin Rash and Other (See Comments)    Reaction: Trouble breathing    Follow-up Information    [facility] Follow [facility].   Specialty:  Home Health Services Why:  [facility] nursing/physical/occupational therapy/social worker/aide Contact information: [address] [place] [address] [phone]        Jeffery Luis, MD. Schedule an appointment as soon as possible for a visit in 1 week(s).   Specialty:  Family Medicine Why:  f/u in 1- 2 weeks. Contact information: [address] [place] [address] [phone]        Antonieta Iba, MD Follow up.   Specialty:  Cardiology Why:  f/u as scheduled. Contact  information: [address] [place] [address] [phone]            The results of significant diagnostics from this hospitalization (including imaging, microbiology, ancillary and laboratory) are listed below for reference.    Significant Diagnostic Studies: Dg Chest Port 1 View  Result Date: 09/26/2017 CLINICAL DATA:  complaints of cellulitis to the right leg with a fever of 101.2 today. Pt has had the cellulitis for the last year but has gotten progressively worse over the past week. Short of breath EXAM: PORTABLE CHEST 1 VIEW COMPARISON:  Chest x-ray dated 07/16/2016 FINDINGS: Heart size and mediastinal contours are stable. Both lungs remain clear. No pleural effusion or pneumothorax seen. Osseous structures about the chest are unremarkable. IMPRESSION: No active disease.  No evidence of pneumonia or pulmonary edema. Electronically Signed   By: Bary Richard M.D.   On: 09/26/2017 19:39   US Abdomen Limited Ruq  Result Date: 09/27/2017 CLINICAL DATA:  Elevated liver function studies. History of previous cholecystectomy. EXAM: ULTRASOUND ABDOMEN LIMITED RIGHT UPPER QUADRANT COMPARISON:  Report of a pre cholecystectomy abdominal ultrasound of Sep 23, 2003 FINDINGS: Gallbladder: The gallbladder is surgically absent. Common bile duct: Diameter: 3.5 mm Liver: The hepatic echotexture is normal. The surface contour is smooth. There is no focal mass nor ductal dilation. Portal vein is patent on color Doppler imaging with normal direction of blood flow towards the liver. IMPRESSION: No focal hepatic masses nor evidence of intrahepatic ductal dilation. Normal calibered common bile duct. Previous cholecystectomy. Electronically Signed   By: David  [place] M.D.   On: 09/27/2017 09:45    Microbiology: Recent Results (from the past 240 hour(s))  Culture, blood (Routine X 2) w Reflex to ID Panel     Status: None (Preliminary result)   Collection Time: 09/26/17  8:16 PM  Result Value  Ref Range Status   Specimen Description   Final    BLOOD LEFT HAND Performed at [facility], [address] [place], [place] [address]    Special Requests   Final    BOTTLES DRAWN AEROBIC AND ANAEROBIC Blood Culture results may not be optimal due to an inadequate volume of blood received in culture bottles Performed at [facility], [address] [place], [place] [address]    Culture   Final    NO GROWTH 3  DAYS Performed at [facility], [address], [place], [place] [address]    Report Status PENDING  Incomplete  Culture, blood (Routine X 2) w Reflex to ID Panel     Status: None (Preliminary result)   Collection Time: 09/26/17  8:37 PM  Result Value Ref Range Status   Specimen Description   Final    BLOOD RIGHT FOREARM Performed at [facility], [address], [place], [place] [address]    Special Requests   Final    BOTTLES DRAWN AEROBIC AND ANAEROBIC Blood Culture results may not be optimal due to an inadequate volume of blood received in culture bottles Performed at [facility], [address] [place], [place] [address]    Culture   Final    NO GROWTH 3 DAYS Performed at [facility], [address], [place], [place] [address]    Report Status PENDING  Incomplete  Culture, Urine     Status: Abnormal (Preliminary result)   Collection Time: 09/26/17 10:58 PM  Result Value Ref Range Status   Specimen Description   Final    URINE, RANDOM Performed at [facility], [address], [place], [place] [address]    Special Requests   Final    NONE Performed at [facility], [address] [place], [place] [address]    Culture >=100,000 COLONIES/mL ESCHERICHIA COLI (A)  Final   Report Status PENDING  Incomplete     Labs: Basic Metabolic Panel: Recent Labs  Lab 09/26/17 2015 09/27/17 0753 09/28/17 0439 09/29/17 0534  NA 143 139 140 136   K 3.2* 3.4* 3.2* 4.0  CL 105 102 103 102  CO2 GLUCOSE 106* 96 99 92  BUN CREATININE 0.91 0.76 0.94 0.86  CALCIUM 8.8* 8.2* 8.6* 7.8*  MG  --  1.8 1.9  --   PHOS  --  2.6  --   --    Liver Function Tests: Recent Labs  Lab 09/26/17 2015 09/27/17 0753  AST 22 29  ALT 19 17  ALKPHOS 73 65  BILITOT 3.2* 3.0*  PROT 6.4* 6.1*  ALBUMIN 3.0* 2.6*   No results for input(s): LIPASE, AMYLASE in the last 168 hours. No results for input(s): AMMONIA in the last 168 hours. CBC: Recent Labs  Lab 09/26/17 2015 09/27/17 0753 09/29/17 0534  WBC 14.3* 11.1* 5.1  NEUTROABS 12.4*  --  3.5  HGB 12.5* 12.5* 11.2*  HCT 38.8* 39.0 34.4*  MCV 82.9 83.7 81.9  PLT 170 148* 142*   Cardiac Enzymes: Recent Labs  Lab 09/27/17 0753  CKTOTAL 69   BNP: BNP (last 3 results) Recent Labs    08/06/17 2156  BNP 163.0*    ProBNP (last 3 results) Recent Labs    10/04/16 1049  PROBNP 105.0*    CBG: Recent Labs  Lab 09/28/17 1209 09/28/17 1653 09/28/17 2124 09/29/17 0751 09/29/17 1126  GLUCAP 91 72 75 105* 108*       Signed:  Ramiro Harvest MD.  [facility] 09/29/2017, 3:49 PM

## 2017-09-29 NOTE — Progress Notes (Signed)
Physical Therapy Treatment Patient Details Name: [name] MRN: [id number] DOB: 1931-01-06 Today's Date: 09/29/2017    History of Present Illness 84 y.o.malewith medical history significantforCAD/MI, ejection fraction, moderate tension, diet, chronic lymphedema bilateral lower extremities presents to the ED from home where he was sent by home health nursing due to worsening right lower extremity edema with progressive redness and fever. He was admitted for right lower extremity cellulitis, and he was also found to have UTI.     PT Comments    Pt OOB in recliner.  General Gait Details: attempted to amb with SPC initially however too unsteady.  Amb with RW a greater distance but still limited due to 2/4 dyspnea and c/o fatigue.   Follow Up Recommendations  Home health PT;Supervision for mobility/OOB     Equipment Recommendations  None recommended by PT    Recommendations for Other Services       Precautions / Restrictions Precautions Precautions: Fall Precaution Comments: pt denies h/o falls in past 1 year Restrictions Weight Bearing Restrictions: No    Mobility  Bed Mobility               General bed mobility comments: OOB in recliner   Transfers Overall transfer level: Needs assistance Equipment used: Rolling walker (2 wheeled) Transfers: Sit to/from [employer] Sit to Stand: Supervision;Min guard Stand pivot transfers: Supervision;Min guard       General transfer comment: good use of hands to steady self   Ambulation/Gait Ambulation/Gait assistance: Min guard Ambulation Distance (Feet): 62 Feet Assistive device: Rolling walker (2 wheeled) Gait Pattern/deviations: Step-to pattern;Decreased step length - right;Decreased step length - left Gait velocity: decr   General Gait Details: attempted to amb with SPC initially however too unsteady.  Amb with RW a greater distance but still limited due to 2/4 dyspnea and c/o fatigue.    Stairs              Wheelchair Mobility    Modified Rankin (Stroke Patients Only)       Balance                                            Cognition Arousal/Alertness: Awake/alert Behavior During Therapy: WFL for tasks assessed/performed Overall Cognitive Status: Within Functional Limits for tasks assessed                                        Exercises      General Comments        Pertinent Vitals/Pain Pain Assessment: No/denies pain    Home Living                      Prior Function            PT Goals (current goals can now be found in the care plan section) Progress towards PT goals: Progressing toward goals    Frequency    Min 3X/week      PT Plan Current plan remains appropriate    Co-evaluation              AM-PAC PT "6 Clicks" Daily Activity  Outcome Measure  Difficulty turning over in bed (including adjusting bedclothes, sheets and blankets)?: A Little Difficulty moving from lying on back to sitting on  the side of the bed? : A Little Difficulty sitting down on and standing up from a chair with arms (e.g., wheelchair, bedside commode, etc,.)?: A Little Help needed moving to and from a bed to chair (including a wheelchair)?: A Little Help needed walking in hospital room?: A Little Help needed climbing 3-5 steps with a railing? : A Little 6 Click Score: 18    End of Session Equipment Utilized During Treatment: Gait belt Activity Tolerance: Patient tolerated treatment well Patient left: in chair;with chair alarm set;with call bell/phone within reach Nurse Communication: Mobility status PT Visit Diagnosis: Difficulty in walking, not elsewhere classified (R26.2)     Time: 3086-5784 PT Time Calculation (min) (ACUTE ONLY): 22 min  Charges:  $Gait Training: 8-22 mins                    G Codes:       Felecia Shelling  PTA [facility]  Acute  Rehab Pager      [phone]

## 2017-09-29 NOTE — Care Management Note (Signed)
Case Management Note  Patient Details  Name: [name] MRN: [id number] Date of Birth: [birth year]  Subjective/Objective: PT-recc HHC;24hr supv-spoke to patient in rm-he defers to his brother [name] Vallance(speaker phone-c#[phone])-informed of HHC recc-chose [facility]-rep Karen aware.Patient will stay w/brother @ address:[address] [place] [address]-patient/brother Joe both voiced understanding about HHC(intermittent services; and difference between custodial level care-privat duty(out of pocket cost) recc if family/church friends/friends available to come & sit w/patient periodically-patient/brother voiced understanding. Brother [name] will provide own transp home. No further CM needs.                  Action/Plan:d/c home w/HHC.   Expected Discharge Date:  (unknown)               Expected Discharge Plan:  Home w Home Health Services  In-House Referral:     Discharge planning Services  CM Consult  Post Acute Care Choice:  Durable Medical Equipment(has cane,rw) Choice offered to:  Sibling  DME Arranged:    DME Agency:     HH Arranged:  RN, PT, OT, Nurse's Aide, Social Work [employer] Agency:  [facility]  Status of Service:  Completed, signed off  If discussed at [employer], dates discussed:    Additional Comments:  Lanier Clam, RN 09/29/2017, 11:55 AM

## 2017-09-30 ENCOUNTER — Telehealth: Payer: Self-pay | Admitting: Family Medicine

## 2017-09-30 LAB — URINE CULTURE: Culture: >=100000 — AB

## 2017-09-30 NOTE — Telephone Encounter (Signed)
First, attempt made for TCM call left voicemail asking patient to return call to office.

## 2017-10-01 LAB — CULTURE, BLOOD (ROUTINE X 2)
Culture: NO GROWTH
Culture: NO GROWTH

## 2017-10-06 NOTE — Telephone Encounter (Signed)
Not able to reach patient by phone due staying with family member now in [place] .

## 2017-10-07 ENCOUNTER — Telehealth: Payer: Self-pay | Admitting: Family Medicine

## 2017-10-07 DIAGNOSIS — Z87891 Personal history of nicotine dependence: Secondary | ICD-10-CM | POA: Diagnosis not present

## 2017-10-07 DIAGNOSIS — I110 Hypertensive heart disease with heart failure: Secondary | ICD-10-CM | POA: Diagnosis not present

## 2017-10-07 DIAGNOSIS — E785 Hyperlipidemia, unspecified: Secondary | ICD-10-CM | POA: Diagnosis not present

## 2017-10-07 DIAGNOSIS — I890 Lymphedema, not elsewhere classified: Secondary | ICD-10-CM | POA: Diagnosis not present

## 2017-10-07 DIAGNOSIS — K219 Gastro-esophageal reflux disease without esophagitis: Secondary | ICD-10-CM | POA: Diagnosis not present

## 2017-10-07 DIAGNOSIS — Z7902 Long term (current) use of antithrombotics/antiplatelets: Secondary | ICD-10-CM | POA: Diagnosis not present

## 2017-10-07 DIAGNOSIS — J45909 Unspecified asthma, uncomplicated: Secondary | ICD-10-CM | POA: Diagnosis not present

## 2017-10-07 DIAGNOSIS — E669 Obesity, unspecified: Secondary | ICD-10-CM | POA: Diagnosis not present

## 2017-10-07 DIAGNOSIS — Z87440 Personal history of urinary (tract) infections: Secondary | ICD-10-CM | POA: Diagnosis not present

## 2017-10-07 DIAGNOSIS — I2510 Atherosclerotic heart disease of native coronary artery without angina pectoris: Secondary | ICD-10-CM | POA: Diagnosis not present

## 2017-10-07 DIAGNOSIS — I5032 Chronic diastolic (congestive) heart failure: Secondary | ICD-10-CM | POA: Diagnosis not present

## 2017-10-07 DIAGNOSIS — M1990 Unspecified osteoarthritis, unspecified site: Secondary | ICD-10-CM | POA: Diagnosis not present

## 2017-10-07 DIAGNOSIS — I83813 Varicose veins of bilateral lower extremities with pain: Secondary | ICD-10-CM | POA: Diagnosis not present

## 2017-10-07 DIAGNOSIS — L03115 Cellulitis of right lower limb: Secondary | ICD-10-CM | POA: Diagnosis not present

## 2017-10-07 NOTE — Telephone Encounter (Signed)
Copied from CRM [id number]. Topic: Quick Communication - See Telephone Encounter >> Oct 07, 2017  3:57 PM Terisa Starr wrote: CRM for notification. See Telephone encounter for: 10/07/17.   [facility] called and said she needs verbal orders to see the patient for for skilled nursing. She would like to see him for nursing for 1 week 1, 2 week 2 and 1 every other week for 6 weeks. She also said he needs an home health aid but did not catch the verbals for the frequency. Please call LaQuane at [phone]

## 2017-10-09 DIAGNOSIS — I110 Hypertensive heart disease with heart failure: Secondary | ICD-10-CM | POA: Diagnosis not present

## 2017-10-09 DIAGNOSIS — I83813 Varicose veins of bilateral lower extremities with pain: Secondary | ICD-10-CM | POA: Diagnosis not present

## 2017-10-09 DIAGNOSIS — L03115 Cellulitis of right lower limb: Secondary | ICD-10-CM | POA: Diagnosis not present

## 2017-10-09 DIAGNOSIS — I2510 Atherosclerotic heart disease of native coronary artery without angina pectoris: Secondary | ICD-10-CM | POA: Diagnosis not present

## 2017-10-09 DIAGNOSIS — I890 Lymphedema, not elsewhere classified: Secondary | ICD-10-CM | POA: Diagnosis not present

## 2017-10-09 DIAGNOSIS — I5032 Chronic diastolic (congestive) heart failure: Secondary | ICD-10-CM | POA: Diagnosis not present

## 2017-10-11 DIAGNOSIS — I110 Hypertensive heart disease with heart failure: Secondary | ICD-10-CM | POA: Diagnosis not present

## 2017-10-11 DIAGNOSIS — I2510 Atherosclerotic heart disease of native coronary artery without angina pectoris: Secondary | ICD-10-CM | POA: Diagnosis not present

## 2017-10-11 DIAGNOSIS — I5032 Chronic diastolic (congestive) heart failure: Secondary | ICD-10-CM | POA: Diagnosis not present

## 2017-10-11 DIAGNOSIS — L03115 Cellulitis of right lower limb: Secondary | ICD-10-CM | POA: Diagnosis not present

## 2017-10-11 DIAGNOSIS — I83813 Varicose veins of bilateral lower extremities with pain: Secondary | ICD-10-CM | POA: Diagnosis not present

## 2017-10-11 DIAGNOSIS — I890 Lymphedema, not elsewhere classified: Secondary | ICD-10-CM | POA: Diagnosis not present

## 2017-10-11 NOTE — Telephone Encounter (Signed)
Are these orders ok.

## 2017-10-11 NOTE — Telephone Encounter (Signed)
Left voicemail for Laquane to call the office back for verbal order from Dr. Birdie Sons. Ok for [facility] to give orders per Dr. Birdie Sons see. Last message.

## 2017-10-11 NOTE — Telephone Encounter (Signed)
Verbal orders can be given. 

## 2017-10-13 ENCOUNTER — Other Ambulatory Visit: Payer: Self-pay | Admitting: Family

## 2017-10-13 ENCOUNTER — Ambulatory Visit
Admission: RE | Admit: 2017-10-13 | Discharge: 2017-10-13 | Disposition: A | Discharge status: Home / Self Care | Payer: Medicare Other | Source: Ambulatory Visit | Attending: Family | Admitting: Family

## 2017-10-13 ENCOUNTER — Ambulatory Visit: Payer: Medicare Other | Admitting: Family

## 2017-10-13 ENCOUNTER — Encounter: Payer: Self-pay | Admitting: Family

## 2017-10-13 VITALS — BP 111/38 | HR 51 | Resp 18 | Ht 71.0 in | Wt 262.0 lb

## 2017-10-13 DIAGNOSIS — I10 Essential (primary) hypertension: Secondary | ICD-10-CM

## 2017-10-13 DIAGNOSIS — I110 Hypertensive heart disease with heart failure: Secondary | ICD-10-CM | POA: Diagnosis not present

## 2017-10-13 DIAGNOSIS — I5033 Acute on chronic diastolic (congestive) heart failure: Secondary | ICD-10-CM | POA: Diagnosis not present

## 2017-10-13 DIAGNOSIS — I890 Lymphedema, not elsewhere classified: Secondary | ICD-10-CM

## 2017-10-13 DIAGNOSIS — L03115 Cellulitis of right lower limb: Secondary | ICD-10-CM | POA: Diagnosis not present

## 2017-10-13 DIAGNOSIS — I5032 Chronic diastolic (congestive) heart failure: Secondary | ICD-10-CM | POA: Diagnosis not present

## 2017-10-13 DIAGNOSIS — I83813 Varicose veins of bilateral lower extremities with pain: Secondary | ICD-10-CM | POA: Diagnosis not present

## 2017-10-13 DIAGNOSIS — E1159 Type 2 diabetes mellitus with other circulatory complications: Secondary | ICD-10-CM

## 2017-10-13 DIAGNOSIS — I2510 Atherosclerotic heart disease of native coronary artery without angina pectoris: Secondary | ICD-10-CM | POA: Diagnosis not present

## 2017-10-13 LAB — BASIC METABOLIC PANEL
Anion gap: 8 (ref 5–15)
BUN: 8 mg/dL (ref 6–20)
CALCIUM: 8.5 mg/dL — AB (ref 8.9–10.3)
CO2: 30 mmol/L (ref 22–32)
CREATININE: 0.82 mg/dL (ref 0.61–1.24)
Chloride: 101 mmol/L (ref 101–111)
GFR calc Af Amer: >60 mL/min (ref >60)
GFR calc non Af Amer: >60 mL/min (ref >60)
GLUCOSE: 93 mg/dL (ref 65–99)
Potassium: 3.7 mmol/L (ref 3.5–5.1)
Sodium: 139 mmol/L (ref 135–145)

## 2017-10-13 LAB — BRAIN NATRIURETIC PEPTIDE: B Natriuretic Peptide: 127 pg/mL — ABNORMAL HIGH (ref 0.0–100.0)

## 2017-10-13 MED ORDER — POTASSIUM CHLORIDE CRYS ER 20 MEQ PO TBCR
EXTENDED_RELEASE_TABLET | ORAL | Status: AC
  Filled 2017-10-13: qty 2

## 2017-10-13 MED ORDER — FUROSEMIDE 10 MG/ML IJ SOLN
80.0000 mg | Freq: Once | INTRAMUSCULAR | Status: AC
  Administered 2017-10-13: 80 mg via INTRAVENOUS

## 2017-10-13 MED ORDER — FUROSEMIDE 10 MG/ML IJ SOLN
INTRAMUSCULAR | Status: AC
  Filled 2017-10-13: qty 4

## 2017-10-13 MED ORDER — SODIUM CHLORIDE FLUSH 0.9 % IV SOLN
INTRAVENOUS | Status: AC
  Filled 2017-10-13: qty 10

## 2017-10-13 MED ORDER — POTASSIUM CHLORIDE CRYS ER 20 MEQ PO TBCR
40.0000 meq | EXTENDED_RELEASE_TABLET | Freq: Once | ORAL | Status: AC
  Administered 2017-10-13: 40 meq via ORAL

## 2017-10-13 NOTE — Progress Notes (Signed)
Patient ID: [name], male    DOB: 1931-03-23, 84 y.o.   MRN: [id number]  HPI  [name] is a 84 y/o male with a history of asthma, CAD, hyperlipidemia, HTN, GERD, lymphedema, remote tobacco use and chronic heart failure.   Echo report from 11/10/16 reviewed and showed an EF of 65-70% along with moderate Jeffery and a PA pressure of 25-30 mm Hg.   Admitted 09/26/17 - 09/29/17 with cellulitis and UTI. Patient initially started on vancomyin and zosyn, narrowed to ceftriaxone, and was discharged with doxycycline and Keflex after 3 days. Admitted 08/06/17 due to HF exacerbation along with cellulitis. Initially given IV antibiotics and lasix and then transitioned to oral medications. Cardiology and wound consults obtained. Discharged after 4 days. Was in the ED 08/05/17 due to sinusitis where he was evaluated and released.   He presents today for a follow-up visit with a chief complaint of increased lower extremity swelling of both his right and left legs. His right leg appears to have more fluid than the left as well as some serious skin changes. He has associated hearing loss and lower extremity edema. He denies any difficulty sleeping, abdominal distention, palpitations, chest pain, shortness of breath, cough or dizziness. Hasn't been weighing himself but does have access to scales. He lives with his brother who states that he will begin weighing him daily. He hasn't been using his compression pumps.   Past Medical History:  Diagnosis Date  . Aortic atherosclerosis (HCC) 05/2013   Per TEE  . Arrhythmia   . Asthma   . CHF (congestive heart failure) (HCC)   . Colon polyp   . Coronary artery disease    a. Nonobstructive by 2004 cath b. Nonobstructive by 2015 cath in setting of NSTEMI  . GERD (gastroesophageal reflux disease)   . Heart murmur   . Hiatal hernia   . Hyperlipidemia   . Hypertension   . Lymphedema   . Moderate aortic stenosis    a. echo 2013: EF 55%, nl wall motion, mild DD, mild LVH, trace  Jeffery, moderate AS with peak gradient of 55 mm Hg; b. TEE 05/2013: EF EF 60-65%, borderline LVH, mild Jeffery/AI, mod AS with valve area 1.0-1.26; c. echo 10/2014: 55-60%, no RWMA, GR1DD, mod AS w/ mean gradient of 28 mm Hg     . Obesity   . Onychomycosis   . Osteoarthritis    Past Surgical History:  Procedure Laterality Date  . CARDIAC CATHETERIZATION  11/2002  . CARDIAC CATHETERIZATION  05/21/2013   showing 30% oLM, 30% D1, 20% mLCx, 30% pRCA; EF 60%, severe AS (mean gradient 28 mmHg, peak 26, area 0.9)  . CHOLECYSTECTOMY  2001  . TRANSESOPHAGEAL ECHOCARDIOGRAM  05/22/2013   Preserved EF, moderate AS (valve area by planimetry between 1.0-1.26 cm sq), moderate aortic arch and descending aortic atherosclerosis.   . TRANSTHORACIC ECHOCARDIOGRAM  05/19/2013   EF 60-65%, impaired diastolic function, mild LA dilatation, mild Jeffery/AI/TR, mod AS, high normal RVSP (30.4 mmHg)   Family History  Problem Relation Age of Onset  . Heart disease Mother   . Heart disease Father 44  . Stroke Brother    Social History   Tobacco Use  . Smoking status: Former Smoker    Packs/Laatsch: 1.50    Years: 20.00    Pack years: 30.00    Types: Cigarettes    Last attempt to quit: 02/22/1981    Years since quitting: 36.6  . Smokeless tobacco: Never Used  Substance Use Topics  .  Alcohol use: No   Allergies  Allergen Reactions  . Aspirin Rash and Other (See Comments)    Reaction: Trouble breathing    Prior to Admission medications   Medication Sig Start Date End Date Taking? Authorizing Provider  atorvastatin (LIPITOR) 80 MG tablet Take 1 tablet (80 mg total) by mouth daily. 06/01/17  Yes Glori Luis, MD  clopidogrel (PLAVIX) 75 MG tablet Take 1 tablet (75 mg total) by mouth daily. 06/01/17  Yes Glori Luis, MD  docusate sodium (COLACE) 100 MG capsule Take 2 capsules (200 mg total) by mouth 2 (two) times daily. 08/10/17  Yes Auburn Bilberry, MD  ferrous sulfate 325 (65 FE) MG EC tablet Take 1 tablet (325 mg  total) by mouth 2 (two) times daily. 06/06/17  Yes Glori Luis, MD  furosemide (LASIX) 40 MG tablet Take 1 tablet (40 mg total) by mouth 2 (two) times daily. 08/10/17  Yes Auburn Bilberry, MD  hydrocerin (EUCERIN) CREA Apply 1 application topically 2 (two) times daily. Apply to bilateral LEs and feet twice daily after cleansing with house skin cleanser and gently patting dry. No not apply between toes. 08/10/17  Yes Auburn Bilberry, MD  lisinopril (PRINIVIL,ZESTRIL) 5 MG tablet Take 1 tablet (5 mg total) by mouth daily. 09/06/17  Yes Glori Luis, MD  metoprolol succinate (TOPROL-XL) 25 MG 24 hr tablet Take 1 tablet (25 mg total) by mouth daily. 06/01/17  Yes Glori Luis, MD  polyethylene glycol [facility] / GLYCOLAX) packet Take 17 g by mouth daily. 08/10/17  Yes Auburn Bilberry, MD  potassium chloride (K-DUR) 10 MEQ tablet Take 1 tablet (10 mEq total) by mouth daily. 06/01/17  Yes Glori Luis, MD    Review of Systems  Constitutional: Positive for fatigue (minimal). Negative for appetite change.  HENT: Positive for hearing loss. Negative for congestion, postnasal drip and sore throat.   Eyes: Negative.   Respiratory: Negative for cough, chest tightness and shortness of breath.   Cardiovascular: Positive for leg swelling (worsening). Negative for chest pain and palpitations.  Gastrointestinal: Negative for abdominal distention and abdominal pain.  Endocrine: Negative.   Genitourinary: Negative.   Musculoskeletal: Negative for back pain and neck pain.  Skin: Negative.   Allergic/Immunologic: Negative.   Neurological: Negative for dizziness and light-headedness.  Hematological: Negative for adenopathy. Does not bruise/bleed easily.  Psychiatric/Behavioral: Negative for dysphoric mood and sleep disturbance (sleeping reclined on couch). The patient is not nervous/anxious.    Vitals:   10/13/17 1026  BP: (!) 111/38  Pulse: (!) 51  Resp: 18  SpO2: 94%  Weight: 262 lb (118.8  kg)  Height:  (1.803 m)   Wt Readings from Last 3 Encounters:  10/13/17 262 lb (118.8 kg)  09/29/17 252 lb 3.3 oz (114.4 kg)  09/15/17 258 lb 4 oz (117.1 kg)     Lab Results  Component Value Date   CREATININE 0.86 09/29/2017   CREATININE 0.94 09/28/2017   CREATININE 0.76 09/27/2017    Physical Exam  Constitutional: He appears well-developed and well-nourished.  HENT:  Head: Normocephalic and atraumatic.  Neck: Normal range of motion. Neck supple. No JVD present.  Cardiovascular: Regular rhythm. Bradycardia present.  Pulmonary/Chest: Effort normal. No respiratory distress. He has no wheezes. He has no rales.  Abdominal: Soft. He exhibits no distension. There is no tenderness.  Musculoskeletal: He exhibits edema (thickened skin on right lower leg with chronic venous skin changes; 2+pitting edema in left lower leg). He exhibits no tenderness.  Nursing note and vitals reviewed.  Assessment & Plan:  1: Acute on Chronic heart failure with preserved ejection fraction- - NYHA class II - severely fluid overloaded with edema in lower legs with R>L - not weighing daily but does have access to scales. Instructed patient and his brother (who he's currently living with) to get up in the morning, use the bathroom and step on the scale and call for an overnight weight gain of >2 pounds or a weekly weight gain of >5 pounds - weight up 10 lbs since last recorded weight on 09/29/17; will have patient receive IV Lasix  today with BMP and BNP - not adding salt to his food. Brother does the cooking/shopping and has been trying to cook low sodium items. Reviewed the importance of closely following a  sodium diet.  - Patient states that he is drinking 3 cups of coffee and 4-5 water bottles a Venti - encouraged to limit fluid intake to 3 cups coffee and 3 water bottles a [place] - saw cardiology Mariah Milling) 08/18/17 - last BNP 08/06/17 was 163.0 - order placed today for new BNP - PharmD reconciled  medications with the patient  2: HTN- - BP looks ok today - saw PCP Birdie Sons) 09/06/17 - BMP 09/28/17 reviewed and showed sodium 140, potassium 3.2 and GFR > 60 - BMP ordered today  3: Diabetes-  - currently diet controlled - A1c on 09/27/17 was 5.5%  4: Lymphedema- - has not been using his compression pumps but does have them - brother says that he will make sure the patient starts to use them twice daily for an hour each time - hasn't been propping his legs up much and he was encouraged to prop them up when he's sitting for long periods of time; definitely needs to elevate them when using compression pumps - patient states that he is drinking 3 cups of coffee and 4-5 water bottles a [place]. Reinforced importance of fluid restriction. Told patient he should not have more than 3 water bottles a [place] if he is drinking 3 cups of coffee - will try to get Vascular appointment moved up from August   Patient did not bring his medications nor a list. Each medication was verbally reviewed with the patient and he was encouraged to bring the bottles to every visit to confirm accuracy of list.  Returning in 1 week 10/19/17 for re-evaluation. Will also see if we can move up his appointment with vasculatur from August due to the severity of his lymphedema.   Yolanda [name], PharmD Pharmacy Resident

## 2017-10-13 NOTE — Progress Notes (Signed)
Lasix IV and Potassium po given to patient, then Lab here for BNP AND BMP, pt s IV Dced and pt wheeled to car

## 2017-10-13 NOTE — Patient Instructions (Addendum)
Begin weighing daily and call for an overnight weight gain of > 2 pounds or a weekly weight gain of >5 pounds. 

## 2017-10-17 ENCOUNTER — Ambulatory Visit (INDEPENDENT_AMBULATORY_CARE_PROVIDER_SITE_OTHER): Payer: Medicare Other | Admitting: Vascular Surgery

## 2017-10-17 ENCOUNTER — Encounter (INDEPENDENT_AMBULATORY_CARE_PROVIDER_SITE_OTHER): Payer: Self-pay | Admitting: Vascular Surgery

## 2017-10-17 VITALS — BP 116/40 | HR 66 | Resp 16 | Ht 71.0 in | Wt 259.0 lb

## 2017-10-17 DIAGNOSIS — I872 Venous insufficiency (chronic) (peripheral): Secondary | ICD-10-CM | POA: Diagnosis not present

## 2017-10-17 DIAGNOSIS — L03115 Cellulitis of right lower limb: Secondary | ICD-10-CM | POA: Diagnosis not present

## 2017-10-17 DIAGNOSIS — I83813 Varicose veins of bilateral lower extremities with pain: Secondary | ICD-10-CM | POA: Diagnosis not present

## 2017-10-17 DIAGNOSIS — E1159 Type 2 diabetes mellitus with other circulatory complications: Secondary | ICD-10-CM

## 2017-10-17 DIAGNOSIS — I10 Essential (primary) hypertension: Secondary | ICD-10-CM | POA: Diagnosis not present

## 2017-10-17 DIAGNOSIS — I890 Lymphedema, not elsewhere classified: Secondary | ICD-10-CM | POA: Diagnosis not present

## 2017-10-17 DIAGNOSIS — I2510 Atherosclerotic heart disease of native coronary artery without angina pectoris: Secondary | ICD-10-CM

## 2017-10-17 DIAGNOSIS — I5032 Chronic diastolic (congestive) heart failure: Secondary | ICD-10-CM | POA: Diagnosis not present

## 2017-10-17 DIAGNOSIS — I110 Hypertensive heart disease with heart failure: Secondary | ICD-10-CM | POA: Diagnosis not present

## 2017-10-19 ENCOUNTER — Ambulatory Visit: Payer: Medicare Other | Attending: Family | Admitting: Family

## 2017-10-19 ENCOUNTER — Encounter: Payer: Self-pay | Admitting: Family

## 2017-10-19 VITALS — BP 114/43 | HR 60 | Resp 18 | Ht 71.0 in | Wt 259.5 lb

## 2017-10-19 DIAGNOSIS — J45909 Unspecified asthma, uncomplicated: Secondary | ICD-10-CM | POA: Diagnosis not present

## 2017-10-19 DIAGNOSIS — I2510 Atherosclerotic heart disease of native coronary artery without angina pectoris: Secondary | ICD-10-CM | POA: Insufficient documentation

## 2017-10-19 DIAGNOSIS — Z87891 Personal history of nicotine dependence: Secondary | ICD-10-CM | POA: Diagnosis not present

## 2017-10-19 DIAGNOSIS — E785 Hyperlipidemia, unspecified: Secondary | ICD-10-CM | POA: Insufficient documentation

## 2017-10-19 DIAGNOSIS — I350 Nonrheumatic aortic (valve) stenosis: Secondary | ICD-10-CM | POA: Insufficient documentation

## 2017-10-19 DIAGNOSIS — I509 Heart failure, unspecified: Secondary | ICD-10-CM | POA: Insufficient documentation

## 2017-10-19 DIAGNOSIS — Z79899 Other long term (current) drug therapy: Secondary | ICD-10-CM | POA: Diagnosis not present

## 2017-10-19 DIAGNOSIS — I890 Lymphedema, not elsewhere classified: Secondary | ICD-10-CM | POA: Diagnosis not present

## 2017-10-19 DIAGNOSIS — E119 Type 2 diabetes mellitus without complications: Secondary | ICD-10-CM | POA: Insufficient documentation

## 2017-10-19 DIAGNOSIS — M7989 Other specified soft tissue disorders: Secondary | ICD-10-CM | POA: Insufficient documentation

## 2017-10-19 DIAGNOSIS — I110 Hypertensive heart disease with heart failure: Secondary | ICD-10-CM | POA: Diagnosis not present

## 2017-10-19 DIAGNOSIS — K219 Gastro-esophageal reflux disease without esophagitis: Secondary | ICD-10-CM | POA: Insufficient documentation

## 2017-10-19 DIAGNOSIS — I10 Essential (primary) hypertension: Secondary | ICD-10-CM

## 2017-10-19 DIAGNOSIS — R001 Bradycardia, unspecified: Secondary | ICD-10-CM | POA: Diagnosis not present

## 2017-10-19 DIAGNOSIS — E1159 Type 2 diabetes mellitus with other circulatory complications: Secondary | ICD-10-CM

## 2017-10-19 DIAGNOSIS — I5032 Chronic diastolic (congestive) heart failure: Secondary | ICD-10-CM | POA: Insufficient documentation

## 2017-10-19 DIAGNOSIS — I5033 Acute on chronic diastolic (congestive) heart failure: Secondary | ICD-10-CM

## 2017-10-19 MED ORDER — POTASSIUM CHLORIDE ER 10 MEQ PO TBCR: 10.0000 meq | EXTENDED_RELEASE_TABLET | Freq: Every day | ORAL | 3 refills | Status: DC

## 2017-10-19 MED ORDER — LISINOPRIL 5 MG PO TABS: 5.0000 mg | ORAL_TABLET | Freq: Every day | ORAL | 3 refills | Status: DC

## 2017-10-19 MED ORDER — CLOPIDOGREL BISULFATE 75 MG PO TABS: 75.0000 mg | ORAL_TABLET | Freq: Every day | ORAL | 3 refills | Status: DC

## 2017-10-19 MED ORDER — ATORVASTATIN CALCIUM 80 MG PO TABS: 80.0000 mg | ORAL_TABLET | Freq: Every day | ORAL | 3 refills | Status: DC

## 2017-10-19 MED ORDER — METOLAZONE 5 MG PO TABS: 5.0000 mg | ORAL_TABLET | Freq: Every day | ORAL | 0 refills | Status: DC

## 2017-10-19 MED ORDER — METOPROLOL SUCCINATE ER 25 MG PO TB24: 25.0000 mg | ORAL_TABLET | Freq: Every day | ORAL | 3 refills | Status: DC

## 2017-10-19 MED ORDER — FUROSEMIDE 40 MG PO TABS: 40.0000 mg | ORAL_TABLET | Freq: Two times a day (BID) | ORAL | 3 refills | Status: DC

## 2017-10-19 NOTE — Patient Instructions (Addendum)
Continue weighing daily and call for an overnight weight gain of > 2 pounds or a weekly weight gain of >5 pounds.  Keep daily fluid intake to 60-64 ounces daily  Take metolazone (booster fluid pill) once daily for the next 3 days. Best to take it 1/2 hour before your furosemide if able. During these 3 days, take an additional potassium tablet.

## 2017-10-19 NOTE — Progress Notes (Signed)
Patient ID: [name], male    DOB: [birth year], 84 y.o.   MRN: [id number]  HPI  [name] is a 84 y/o male with a history of asthma, CAD, hyperlipidemia, HTN, GERD, lymphedema, remote tobacco use and chronic heart failure.   Echo report from 11/10/16 reviewed and showed an EF of 65-70% along with moderate Jeffery and a PA pressure of 25-30 mm Hg.   Admitted 09/26/17 - 09/29/17 with cellulitis and UTI. Patient initially started on vancomyin and zosyn, narrowed to ceftriaxone, and was discharged with doxycycline and Keflex after 3 days. Admitted 08/06/17 due to HF exacerbation along with cellulitis. Initially given IV antibiotics and lasix and then transitioned to oral medications. Cardiology and wound consults obtained. Discharged after 4 days. Was in the ED 08/05/17 due to sinusitis where he was evaluated and released.   He presents today for a follow-up visit with a chief complaint of increased lower extremity swelling of both his right and left legs. His right leg continues to be more swollen than his left. His left leg appears to have less fluid compared to last week. He received one dose of Lasix 80mg  IV last week after appointment with [place]. He also had an appointment with Dr. Gilda Crease on 10/17/17. Patient was given prescription for compression stockings and has a follow up appointment on 01/05/2018. Patient also has hearing loss. He denies any difficulty sleeping, abdominal distention, palpitations, chest pain, shortness of breath, cough or dizziness. Patient's brother has started logging daily weights for him. The brother is unsure of the accuracy of the weights because he has not been wearing his glasses in the morning when weighing the patient. I encouraged him to wear his glasses and keep accurate records of his brother's weight.   Past Medical History:  Diagnosis Date  . Aortic atherosclerosis (HCC) 05/2013   Per TEE  . Arrhythmia   . Asthma   . CHF (congestive heart failure) (HCC)   . Colon polyp   .  Coronary artery disease    a. Nonobstructive by 2004 cath b. Nonobstructive by 2015 cath in setting of NSTEMI  . GERD (gastroesophageal reflux disease)   . Heart murmur   . Hiatal hernia   . Hyperlipidemia   . Hypertension   . Lymphedema   . Moderate aortic stenosis    a. echo 2013: EF 55%, nl wall motion, mild DD, mild LVH, trace Jeffery, moderate AS with peak gradient of 55 mm Hg; b. TEE 05/2013: EF EF 60-65%, borderline LVH, mild Jeffery/AI, mod AS with valve area 1.0-1.26; c. echo 10/2014: 55-60%, no RWMA, GR1DD, mod AS w/ mean gradient of 28 mm Hg     . Obesity   . Onychomycosis   . Osteoarthritis    Past Surgical History:  Procedure Laterality Date  . CARDIAC CATHETERIZATION  11/2002  . CARDIAC CATHETERIZATION  05/21/2013   showing 30% oLM, 30% D1, 20% mLCx, 30% pRCA; EF 60%, severe AS (mean gradient 28 mmHg, peak 26, area 0.9)  . CHOLECYSTECTOMY  2001  . TRANSESOPHAGEAL ECHOCARDIOGRAM  05/22/2013   Preserved EF, moderate AS (valve area by planimetry between 1.0-1.26 cm sq), moderate aortic arch and descending aortic atherosclerosis.   . TRANSTHORACIC ECHOCARDIOGRAM  05/19/2013   EF 60-65%, impaired diastolic function, mild LA dilatation, mild Jeffery/AI/TR, mod AS, high normal RVSP (30.4 mmHg)   Family History  Problem Relation Age of Onset  . Heart disease Mother   . Heart disease Father 72  . Stroke Brother  Social History   Tobacco Use  . Smoking status: Former Smoker    Packs/Rainey: 1.50    Years: 20.00    Pack years: 30.00    Types: Cigarettes    Last attempt to quit: 02/22/1981    Years since quitting: 36.6  . Smokeless tobacco: Never Used  Substance Use Topics  . Alcohol use: No   Allergies  Allergen Reactions  . Aspirin Rash and Other (See Comments)    Reaction: Trouble breathing    Prior to Admission medications   Medication Sig Start Date End Date Taking? Authorizing Provider  atorvastatin (LIPITOR) 80 MG tablet Take 1 tablet (80 mg total) by mouth daily. 06/01/17  Yes  Glori LuisSonnenberg, Eric G, MD  clopidogrel (PLAVIX) 75 MG tablet Take 1 tablet (75 mg total) by mouth daily. 06/01/17  Yes Glori LuisSonnenberg, Eric G, MD  docusate sodium (COLACE) 100 MG capsule Take 2 capsules (200 mg total) by mouth 2 (two) times daily. 08/10/17  Yes Auburn BilberryPatel, Shreyang, MD  ferrous sulfate 325 (65 FE) MG EC tablet Take 1 tablet (325 mg total) by mouth 2 (two) times daily. 06/06/17  Yes Glori LuisSonnenberg, Eric G, MD  furosemide (LASIX) 40 MG tablet Take 1 tablet (40 mg total) by mouth 2 (two) times daily. 08/10/17  Yes Auburn BilberryPatel, Shreyang, MD  hydrocerin (EUCERIN) CREA Apply 1 application topically 2 (two) times daily. Apply to bilateral LEs and feet twice daily after cleansing with house skin cleanser and gently patting dry. No not apply between toes. 08/10/17  Yes Auburn BilberryPatel, Shreyang, MD  lisinopril (PRINIVIL,ZESTRIL) 5 MG tablet Take 1 tablet (5 mg total) by mouth daily. 09/06/17  Yes Glori LuisSonnenberg, Eric G, MD  metoprolol succinate (TOPROL-XL) 25 MG 24 hr tablet Take 1 tablet (25 mg total) by mouth daily. 06/01/17  Yes Glori LuisSonnenberg, Eric G, MD  polyethylene glycol [facility](MIRALAX / GLYCOLAX) packet Take 17 g by mouth daily. 08/10/17  Yes Auburn BilberryPatel, Shreyang, MD  potassium chloride (K-DUR) 10 MEQ tablet Take 1 tablet (10 mEq total) by mouth daily. 06/01/17  Yes Glori LuisSonnenberg, Eric G, MD    Review of Systems  Constitutional: Positive for fatigue. Negative for appetite change.  HENT: Positive for hearing loss. Negative for congestion, postnasal drip and sore throat.   Eyes: Negative.   Respiratory: Negative for cough, chest tightness and shortness of breath.   Cardiovascular: Positive for leg swelling (better in the left leg). Negative for chest pain and palpitations.  Gastrointestinal: Negative for abdominal distention and abdominal pain.  Endocrine: Negative.   Genitourinary: Negative.   Musculoskeletal: Negative for back pain and neck pain.  Skin: Negative.   Allergic/Immunologic: Negative.   Neurological: Negative for dizziness  and light-headedness.  Hematological: Negative for adenopathy. Does not bruise/bleed easily.  Psychiatric/Behavioral: Negative for dysphoric mood and sleep disturbance (sleeping reclined on couch). The patient is not nervous/anxious.    Vitals:   10/19/17 0931  BP: (!) 114/43  Pulse: 60  Resp: 18  SpO2: 99%  Weight: 259 lb 8 oz (117.7 kg)  Height: 5\' 11"  (1.803 m)   Wt Readings from Last 3 Encounters:  10/19/17 259 lb 8 oz (117.7 kg)  10/17/17 259 lb (117.5 kg)  10/13/17 262 lb (118.8 kg)     Lab Results  Component Value Date   CREATININE 0.82 10/13/2017   CREATININE 0.86 09/29/2017   CREATININE 0.94 09/28/2017    Physical Exam  Constitutional: He appears well-developed and well-nourished.  HENT:  Head: Normocephalic and atraumatic.  Neck: Normal range of motion. Neck supple.  No JVD present.  Cardiovascular: Regular rhythm. Bradycardia present.  Pulmonary/Chest: Effort normal. No respiratory distress. He has no wheezes. He has no rales.  Abdominal: Soft. He exhibits no distension. There is no tenderness.  Musculoskeletal: He exhibits edema (thickened skin on right lower leg with chronic venous skin changes; 2+pitting edema in left lower leg). He exhibits no tenderness.  Nursing note and vitals reviewed.  Assessment & Plan:  1: Acute on Chronic heart failure with preserved ejection fraction- - NYHA class II - moderately fluid overloaded with edema in lower legs with R>L. Saw Dr. Gilda Crease who gave him prescription for compression stockings; discussed importance of wearing - has started weighing daily but accuracy of numbers is questionable. Instructed brother to wear his glasses as above. Restated importance of calling the clinic for an overnight weight gain of >2 pounds or a weekly weight gain of >5 pounds - weight down 3 lbs since last seen in clinic on 10/13/17 but patient is still holding on to fluid. Will give him a short course of metolazone 5mg  daily for 3 days. Also  instructed to take extra of K daily for 3 days. Will get follow-up BMP on 6/12 - not adding salt to his food. Brother does the cooking/shopping and has been trying to cook low sodium items. Reviewed the importance of closely following a 2000mg  sodium diet.  - Patient states that he is drinking 3 cups of coffee and 4-5 water bottles a [place] - encouraged to limit fluid intake to 3 cups coffee and 3 water bottles a [place] - saw cardiology Mariah Milling) 08/18/17 - last BNP 10/13/17 was 127 which is a decrease from 163 on 08/06/17 - Patient needed refill on his medications. Rx sent in for lisinopril, atorvastatin, metoprolol succinate, potassium, furosemide, and clopidogrel - PharmD reconciled medications with the patient  2: HTN- - BP looks ok today - saw PCP Birdie Sons) 09/06/17 - BMP 10/13/17 reviewed and showed sodium 139, potassium 3.7 and GFR > 60/SCr 0.82 - BMP ordered today  3: Diabetes-  - currently diet controlled - A1c on 09/27/17 was 5.5%  4: Lymphedema- - has not been using his compression pumps but does have them - brother says that he will make sure the patient starts to use them twice daily for an hour each time - hasn't been propping his legs up much and he was encouraged to prop them up when he's sitting for long periods of time; definitely needs to elevate them when using compression pumps - patient states that he is drinking 3 cups of coffee and 4-5 water bottles a [place]. Reinforced importance of fluid restriction. Told patient he should not have more than 3 water bottles a [place] if he is drinking 3 cups of coffee - saw vascular Dr. Lorretta Harp on 10/17/17 who prescribed compression stockings. He has a follow-up appointment scheduled on 01/05/18  Patient brought his medications with him. Each medication was verbally reviewed with the patient.  Returning in 1 week 10/26/17 for re-evaluation and follow-up BMP.   Yolanda [name], PharmD Pharmacy Resident

## 2017-10-20 ENCOUNTER — Encounter (INDEPENDENT_AMBULATORY_CARE_PROVIDER_SITE_OTHER): Payer: Self-pay | Admitting: Vascular Surgery

## 2017-10-20 DIAGNOSIS — I5032 Chronic diastolic (congestive) heart failure: Secondary | ICD-10-CM | POA: Diagnosis not present

## 2017-10-20 DIAGNOSIS — I890 Lymphedema, not elsewhere classified: Secondary | ICD-10-CM | POA: Diagnosis not present

## 2017-10-20 DIAGNOSIS — I110 Hypertensive heart disease with heart failure: Secondary | ICD-10-CM | POA: Diagnosis not present

## 2017-10-20 DIAGNOSIS — I83813 Varicose veins of bilateral lower extremities with pain: Secondary | ICD-10-CM | POA: Diagnosis not present

## 2017-10-20 DIAGNOSIS — L03115 Cellulitis of right lower limb: Secondary | ICD-10-CM | POA: Diagnosis not present

## 2017-10-20 DIAGNOSIS — I2510 Atherosclerotic heart disease of native coronary artery without angina pectoris: Secondary | ICD-10-CM | POA: Diagnosis not present

## 2017-10-20 NOTE — Progress Notes (Signed)
MRN : [id number]  Quasim Manring is a 84 y.o. (05/25/1930) male who presents with chief complaint of  Chief Complaint  Patient presents with  . Leg Swelling  .  History of Present Illness:   The patient returns to the office for followup evaluation regarding leg swelling.  The swelling has persisted and the pain associated with swelling continues. There have not been any interval development of new ulcerations or wounds.  Since the previous visit the patient has not been wearing graduated compression stockings and has noted little if any improvement in the lymphedema. The patient has been using compression only occasionally morning until night.  The patient also states elevation during the [place] and exercise is being done too.   Current Meds  Medication Sig  . docusate sodium (COLACE) 100 MG capsule Take 2 capsules (200 mg total) by mouth 2 (two) times daily.  . ferrous sulfate 325 (65 FE) MG EC tablet Take 1 tablet (325 mg total) by mouth 2 (two) times daily.  . hydrocerin (EUCERIN) CREA Apply 1 application topically 2 (two) times daily. Apply to bilateral LEs and feet twice daily after cleansing with house skin cleanser and gently patting dry. No not apply between toes.  . polyethylene glycol (MIRALAX / GLYCOLAX) packet Take 17 g by mouth daily.  . [DISCONTINUED] atorvastatin (LIPITOR) 80 MG tablet Take 1 tablet (80 mg total) by mouth daily.  . [DISCONTINUED] clopidogrel (PLAVIX) 75 MG tablet Take 1 tablet (75 mg total) by mouth daily.  . [DISCONTINUED] furosemide (LASIX) 40 MG tablet Take 1 tablet (40 mg total) by mouth 2 (two) times daily.  . [DISCONTINUED] lisinopril (PRINIVIL,ZESTRIL) 5 MG tablet Take 1 tablet (5 mg total) by mouth daily.  . [DISCONTINUED] metoprolol succinate (TOPROL-XL) 25 MG 24 hr tablet Take 1 tablet (25 mg total) by mouth daily.  . [DISCONTINUED] potassium chloride (K-DUR) 10 MEQ tablet Take 1 tablet (10 mEq total) by mouth daily.    Past Medical History:    Diagnosis Date  . Aortic atherosclerosis (HCC) 05/2013   Per TEE  . Arrhythmia   . Asthma   . CHF (congestive heart failure) (HCC)   . Colon polyp   . Coronary artery disease    a. Nonobstructive by 2004 cath b. Nonobstructive by 2015 cath in setting of NSTEMI  . GERD (gastroesophageal reflux disease)   . Heart murmur   . Hiatal hernia   . Hyperlipidemia   . Hypertension   . Lymphedema   . Moderate aortic stenosis    a. echo 2013: EF 55%, nl wall motion, mild DD, mild LVH, trace MR, moderate AS with peak gradient of 55 mm Hg; b. TEE 05/2013: EF EF 60-65%, borderline LVH, mild MR/AI, mod AS with valve area 1.0-1.26; c. echo 10/2014: 55-60%, no RWMA, GR1DD, mod AS w/ mean gradient of 28 mm Hg     . Obesity   . Onychomycosis   . Osteoarthritis     Past Surgical History:  Procedure Laterality Date  . CARDIAC CATHETERIZATION  11/2002  . CARDIAC CATHETERIZATION  05/21/2013   showing 30% oLM, 30% D1, 20% mLCx, 30% pRCA; EF 60%, severe AS (mean gradient 28 mmHg, peak 26, area 0.9)  . CHOLECYSTECTOMY  2001  . TRANSESOPHAGEAL ECHOCARDIOGRAM  05/22/2013   Preserved EF, moderate AS (valve area by planimetry between 1.0-1.26 cm sq), moderate aortic arch and descending aortic atherosclerosis.   . TRANSTHORACIC ECHOCARDIOGRAM  05/19/2013   EF 60-65%, impaired diastolic function, mild LA dilatation, mild  MR/AI/TR, mod AS, high normal RVSP (30.4 mmHg)    Social History Social History   Tobacco Use  . Smoking status: Former Smoker    Packs/[place]: 1.50    Years: 20.00    Pack years: 30.00    Types: Cigarettes    Last attempt to quit: 02/22/1981    Years since quitting: 36.6  . Smokeless tobacco: Never Used  Substance Use Topics  . Alcohol use: No  . Drug use: No    Family History Family History  Problem Relation Age of Onset  . Heart disease Mother   . Heart disease Father 23  . Stroke Brother     Allergies  Allergen Reactions  . Aspirin Rash and Other (See Comments)    Reaction:  Trouble breathing      REVIEW OF SYSTEMS (Negative unless checked)  Constitutional: [] Weight loss  [] Fever  [] Chills Cardiac: [] Chest pain   [] Chest pressure   [] Palpitations   [] Shortness of breath when laying flat   [] Shortness of breath with exertion. Vascular:  [] Pain in legs with walking   [x] Pain in legs with standing  [] History of DVT   [] Phlebitis   [x] Swelling in legs   [] Varicose veins   [] Non-healing ulcers Pulmonary:   [] Uses home oxygen   [] Productive cough   [] Hemoptysis   [] Wheeze  [] COPD   [] Asthma Neurologic:  [] Dizziness   [] Seizures   [] History of stroke   [] History of TIA  [] Aphasia   [] Vissual changes   [] Weakness or numbness in arm   [] Weakness or numbness in leg Musculoskeletal:   [] Joint swelling   [] Joint pain   [] Low back pain Hematologic:  [] Easy bruising  [] Easy bleeding   [] Hypercoagulable state   [] Anemic Gastrointestinal:  [] Diarrhea   [] Vomiting  [] Gastroesophageal reflux/heartburn   [] Difficulty swallowing. Genitourinary:  [] Chronic kidney disease   [] Difficult urination  [] Frequent urination   [] Blood in urine Skin:  [] Rashes   [] Ulcers  Psychological:  [] History of anxiety   []  History of major depression.  Physical Examination  Vitals:   10/17/17 1134  BP: (!) 116/40  Pulse: 66  Resp: 16  Weight: 259 lb (117.5 kg)  Height: 5\' 11"  (1.803 m)   Body mass index is 36.12 kg/m. Gen: WD/WN, NAD Head: /AT, No temporalis wasting.  Ear/Nose/Throat: Hearing grossly intact, nares w/o erythema or drainage Eyes: PER, EOMI, sclera nonicteric.  Neck: Supple, no large masses.   Pulmonary:  Good air movement, no audible wheezing bilaterally, no use of accessory muscles.  Cardiac: RRR, no JVD Vascular: scattered varicosities present bilaterally.  Mild venous stasis changes to the legs bilaterally.  3+ soft pitting edema Vessel Right Left  Radial Palpable Palpable  PT Palpable Palpable  DP Palpable Palpable  Gastrointestinal: Non-distended. No guarding/no  peritoneal signs.  Musculoskeletal: M/S 5/5 throughout.  No deformity or atrophy.  Neurologic: CN 2-12 intact. Symmetrical.  Speech is fluent. Motor exam as listed above. Psychiatric: Judgment intact, Mood & affect appropriate for pt's clinical situation. Dermatologic:mild venous rashes.  No changes consistent with cellulitis. Lymph : No lichenification or skin changes of chronic lymphedema.  CBC Lab Results  Component Value Date   WBC 5.1 09/29/2017   HGB 11.2 (L) 09/29/2017   HCT 34.4 (L) 09/29/2017   MCV 81.9 09/29/2017   PLT 142 (L) 09/29/2017    BMET    Component Value Date/Time   NA 139 10/13/2017 1210   NA 141 05/26/2014 1101   K 3.7 10/13/2017 1210   K 3.6 05/26/2014  1101   CL 101 10/13/2017 1210   CL 106 05/26/2014 1101   CO2 30 10/13/2017 1210   CO2 27 05/26/2014 1101   GLUCOSE 93 10/13/2017 1210   GLUCOSE 100 (H) 05/26/2014 1101   BUN 8 10/13/2017 1210   BUN 16 05/26/2014 1101   CREATININE 0.82 10/13/2017 1210   CREATININE 0.87 05/26/2014 1101   CALCIUM 8.5 (L) 10/13/2017 1210   CALCIUM 8.8 05/26/2014 1101   GFRNONAA >60 10/13/2017 1210   GFRNONAA >60 05/26/2014 1101   GFRNONAA >60 05/21/2013 0403   GFRAA >60 10/13/2017 1210   GFRAA >60 05/26/2014 1101   GFRAA >60 05/21/2013 0403   Estimated Creatinine Clearance: 87.5 mL/min (by C-G formula based on SCr of 0.82 mg/dL).  COAG Lab Results  Component Value Date   INR 1.1 05/18/2013    Radiology Dg Chest Port 1 View  Result Date: 09/26/2017 CLINICAL DATA:  complaints of cellulitis to the right leg with a fever of 101.2 today. Pt has had the cellulitis for the last year but has gotten progressively worse over the past week. Short of breath EXAM: PORTABLE CHEST 1 VIEW COMPARISON:  Chest x-ray dated 07/16/2016 FINDINGS: Heart size and mediastinal contours are stable. Both lungs remain clear. No pleural effusion or pneumothorax seen. Osseous structures about the chest are unremarkable. IMPRESSION: No active  disease.  No evidence of pneumonia or pulmonary edema. Electronically Signed   By: Bary Richard M.D.   On: 09/26/2017 19:39   US Abdomen Limited Ruq  Result Date: 09/27/2017 CLINICAL DATA:  Elevated liver function studies. History of previous cholecystectomy. EXAM: ULTRASOUND ABDOMEN LIMITED RIGHT UPPER QUADRANT COMPARISON:  Report of a pre cholecystectomy abdominal ultrasound of Sep 23, 2003 FINDINGS: Gallbladder: The gallbladder is surgically absent. Common bile duct: Diameter: 3.5 mm Liver: The hepatic echotexture is normal. The surface contour is smooth. There is no focal mass nor ductal dilation. Portal vein is patent on color Doppler imaging with normal direction of blood flow towards the liver. IMPRESSION: No focal hepatic masses nor evidence of intrahepatic ductal dilation. Normal calibered common bile duct. Previous cholecystectomy. Electronically Signed   By: David  [place] M.D.   On: 09/27/2017 09:45      Assessment/Plan 1. Lymphedema  No surgery or intervention at this point in time.    I have reviewed my discussion with the patient regarding lymphedema and why it  causes symptoms.  Patient will evaluate [place] wraps or compression wraps as these may be more successful than stockings.  I encouraged him to wear these on a daily basis a prescription was given. The patient is reminded to put the stockings on first thing in the morning and removing them in the evening. The patient is instructed specifically not to sleep in the stockings.   In addition, behavioral modification throughout the [place] will be continued.  This will include frequent elevation (such as in a recliner), use of over the counter pain medications as needed and exercise such as walking.  I have reviewed systemic causes for chronic edema such as liver, kidney and cardiac etiologies and there does not appear to be any significant changes in these organ systems over the past year.  The patient is under the impression that these  organ systems are all stable and unchanged.    The patient will continue aggressive use of the  lymph pump.  This will continue to improve the edema control and prevent sequela such as ulcers and infections.   The patient will follow-up  with me on an annual basis.    2. Chronic venous insufficiency See #1  3. Essential hypertension Continue antihypertensive medications as already ordered, these medications have been reviewed and there are no changes at this time.   4. Coronary artery disease, non-occlusive Continue cardiac and antihypertensive medications as already ordered and reviewed, no changes at this time.  Continue statin as ordered and reviewed, no changes at this time  Nitrates PRN for chest pain   5. Type 2 diabetes mellitus with other circulatory complication, without long-term current use of insulin (HCC) Continue hypoglycemic medications as already ordered, these medications have been reviewed and there are no changes at this time.  Hgb A1C to be monitored as already arranged by primary service    Levora DredgeGregory Trae Bovenzi, MD  10/20/2017 10:48 PM

## 2017-10-26 ENCOUNTER — Telehealth: Payer: Self-pay | Admitting: Family

## 2017-10-26 ENCOUNTER — Encounter: Payer: Self-pay | Admitting: Family

## 2017-10-26 ENCOUNTER — Ambulatory Visit: Payer: Medicare Other | Attending: Family | Admitting: Family

## 2017-10-26 ENCOUNTER — Other Ambulatory Visit: Payer: Self-pay | Admitting: Family

## 2017-10-26 VITALS — BP 107/40 | HR 62 | Resp 18 | Ht 71.0 in | Wt 245.4 lb

## 2017-10-26 DIAGNOSIS — E669 Obesity, unspecified: Secondary | ICD-10-CM | POA: Diagnosis not present

## 2017-10-26 DIAGNOSIS — Z823 Family history of stroke: Secondary | ICD-10-CM | POA: Diagnosis not present

## 2017-10-26 DIAGNOSIS — Z886 Allergy status to analgesic agent status: Secondary | ICD-10-CM | POA: Insufficient documentation

## 2017-10-26 DIAGNOSIS — E785 Hyperlipidemia, unspecified: Secondary | ICD-10-CM | POA: Insufficient documentation

## 2017-10-26 DIAGNOSIS — I10 Essential (primary) hypertension: Secondary | ICD-10-CM

## 2017-10-26 DIAGNOSIS — Z87891 Personal history of nicotine dependence: Secondary | ICD-10-CM | POA: Insufficient documentation

## 2017-10-26 DIAGNOSIS — M1990 Unspecified osteoarthritis, unspecified site: Secondary | ICD-10-CM | POA: Diagnosis not present

## 2017-10-26 DIAGNOSIS — Z86010 Personal history of colonic polyps: Secondary | ICD-10-CM | POA: Diagnosis not present

## 2017-10-26 DIAGNOSIS — I350 Nonrheumatic aortic (valve) stenosis: Secondary | ICD-10-CM | POA: Insufficient documentation

## 2017-10-26 DIAGNOSIS — I890 Lymphedema, not elsewhere classified: Secondary | ICD-10-CM | POA: Diagnosis not present

## 2017-10-26 DIAGNOSIS — Z8249 Family history of ischemic heart disease and other diseases of the circulatory system: Secondary | ICD-10-CM | POA: Diagnosis not present

## 2017-10-26 DIAGNOSIS — I110 Hypertensive heart disease with heart failure: Secondary | ICD-10-CM | POA: Diagnosis not present

## 2017-10-26 DIAGNOSIS — E1159 Type 2 diabetes mellitus with other circulatory complications: Secondary | ICD-10-CM

## 2017-10-26 DIAGNOSIS — Z79899 Other long term (current) drug therapy: Secondary | ICD-10-CM | POA: Diagnosis not present

## 2017-10-26 DIAGNOSIS — I509 Heart failure, unspecified: Secondary | ICD-10-CM | POA: Diagnosis present

## 2017-10-26 DIAGNOSIS — Z7902 Long term (current) use of antithrombotics/antiplatelets: Secondary | ICD-10-CM | POA: Diagnosis not present

## 2017-10-26 DIAGNOSIS — I5032 Chronic diastolic (congestive) heart failure: Secondary | ICD-10-CM

## 2017-10-26 DIAGNOSIS — E119 Type 2 diabetes mellitus without complications: Secondary | ICD-10-CM | POA: Diagnosis not present

## 2017-10-26 DIAGNOSIS — Z6834 Body mass index (BMI) 34.0-34.9, adult: Secondary | ICD-10-CM | POA: Insufficient documentation

## 2017-10-26 LAB — BASIC METABOLIC PANEL
ANION GAP: 11 (ref 5–15)
BUN: 22 mg/dL — ABNORMAL HIGH (ref 6–20)
CALCIUM: 8.5 mg/dL — AB (ref 8.9–10.3)
CO2: 33 mmol/L — ABNORMAL HIGH (ref 22–32)
Chloride: 92 mmol/L — ABNORMAL LOW (ref 101–111)
Creatinine, Ser: 1.22 mg/dL (ref 0.61–1.24)
GFR calc Af Amer: >60 mL/min (ref >60)
GFR, EST NON AFRICAN AMERICAN: 53 mL/min — AB (ref >60)
GLUCOSE: 139 mg/dL — AB (ref 65–99)
Potassium: 2.5 mmol/L — CL (ref 3.5–5.1)
SODIUM: 136 mmol/L (ref 135–145)

## 2017-10-26 NOTE — Patient Instructions (Signed)
Continue weighing daily and call for an overnight weight gain of > 2 pounds or a weekly weight gain of >5 pounds. 

## 2017-10-26 NOTE — Progress Notes (Signed)
Patient ID: Yvette Rowand, male    DOB: Dec 09, 1930, 84 y.o.   MRN: [id number]  HPI  Mr Luis is a 84 y/o male with a history of asthma, CAD, hyperlipidemia, HTN, GERD, lymphedema, remote tobacco use and chronic heart failure.   Echo report from 11/10/16 reviewed and showed an EF of 65-70% along with moderate MR and a PA pressure of 25-30 mm Hg.   Admitted 09/26/17 - 09/29/17 with cellulitis and UTI. Patient initially started on vancomyin and zosyn, narrowed to ceftriaxone, and was discharged with doxycycline and Keflex after 3 days. Admitted 08/06/17 due to HF exacerbation along with cellulitis. Initially given IV antibiotics and lasix and then transitioned to oral medications. Cardiology and wound consults obtained. Discharged after 4 days. Was in the ED 08/05/17 due to sinusitis where he was evaluated and released.   He presents today for a follow-up visit with a chief complaint of swelling of both his right and left legs. His right leg continues to be more swollen than his left due to chronic lymphedema. His left leg is significantly improved since last week. He received one dose of Lasix 80mg  IV two weeks ago and was given a short course of metolazone for 3 days at last week's appointment. He also had an appointment with Dr. Gilda Crease on 10/17/17. Patient was given prescription for compression stockings and has a follow up appointment on 01/05/2018. Patient also has hearing loss. He denies any difficulty sleeping, abdominal distention, palpitations, chest pain, shortness of breath, cough or dizziness. Patient's brother has started logging daily weights for him. The brother is unsure of the accuracy of the weights because he has not been wearing his glasses in the morning when weighing the patient. I encouraged him to wear his glasses and keep accurate records of his brother's weight. He also states that he is not sure if his scale is accurate and is going to get a new scale.   Past Medical History:  Diagnosis  Date  . Aortic atherosclerosis (HCC) 05/2013   Per TEE  . Arrhythmia   . Asthma   . CHF (congestive heart failure) (HCC)   . Colon polyp   . Coronary artery disease    a. Nonobstructive by 2004 cath b. Nonobstructive by 2015 cath in setting of NSTEMI  . GERD (gastroesophageal reflux disease)   . Heart murmur   . Hiatal hernia   . Hyperlipidemia   . Hypertension   . Lymphedema   . Moderate aortic stenosis    a. echo 2013: EF 55%, nl wall motion, mild DD, mild LVH, trace MR, moderate AS with peak gradient of 55 mm Hg; b. TEE 05/2013: EF EF 60-65%, borderline LVH, mild MR/AI, mod AS with valve area 1.0-1.26; c. echo 10/2014: 55-60%, no RWMA, GR1DD, mod AS w/ mean gradient of 28 mm Hg     . Obesity   . Onychomycosis   . Osteoarthritis    Past Surgical History:  Procedure Laterality Date  . CARDIAC CATHETERIZATION  11/2002  . CARDIAC CATHETERIZATION  05/21/2013   showing 30% oLM, 30% D1, 20% mLCx, 30% pRCA; EF 60%, severe AS (mean gradient 28 mmHg, peak 26, area 0.9)  . CHOLECYSTECTOMY  2001  . TRANSESOPHAGEAL ECHOCARDIOGRAM  05/22/2013   Preserved EF, moderate AS (valve area by planimetry between 1.0-1.26 cm sq), moderate aortic arch and descending aortic atherosclerosis.   . TRANSTHORACIC ECHOCARDIOGRAM  05/19/2013   EF 60-65%, impaired diastolic function, mild LA dilatation, mild MR/AI/TR, mod AS, high normal RVSP (  30.4 mmHg)   Family History  Problem Relation Age of Onset  . Heart disease Mother   . Heart disease Father 51  . Stroke Brother    Social History   Tobacco Use  . Smoking status: Former Smoker    Packs/Gum: 1.50    Years: 20.00    Pack years: 30.00    Types: Cigarettes    Last attempt to quit: 02/22/1981    Years since quitting: 36.6  . Smokeless tobacco: Never Used  Substance Use Topics  . Alcohol use: No   Allergies  Allergen Reactions  . Aspirin Rash and Other (See Comments)    Reaction: Trouble breathing    Prior to Admission medications   Medication Sig  Start Date End Date Taking? Authorizing Provider  atorvastatin (LIPITOR) 80 MG tablet Take 1 tablet (80 mg total) by mouth daily. 06/01/17  Yes Glori Luis, MD  clopidogrel (PLAVIX) 75 MG tablet Take 1 tablet (75 mg total) by mouth daily. 06/01/17  Yes Glori Luis, MD  docusate sodium (COLACE) 100 MG capsule Take 2 capsules (200 mg total) by mouth 2 (two) times daily. 08/10/17  Yes Auburn Bilberry, MD  ferrous sulfate 325 (65 FE) MG EC tablet Take 1 tablet (325 mg total) by mouth 2 (two) times daily. 06/06/17  Yes Glori Luis, MD  furosemide (LASIX) 40 MG tablet Take 1 tablet (40 mg total) by mouth 2 (two) times daily. 08/10/17  Yes Auburn Bilberry, MD  hydrocerin (EUCERIN) CREA Apply 1 application topically 2 (two) times daily. Apply to bilateral LEs and feet twice daily after cleansing with house skin cleanser and gently patting dry. No not apply between toes. 08/10/17  Yes Auburn Bilberry, MD  lisinopril (PRINIVIL,ZESTRIL) 5 MG tablet Take 1 tablet (5 mg total) by mouth daily. 09/06/17  Yes Glori Luis, MD  metoprolol succinate (TOPROL-XL) 25 MG 24 hr tablet Take 1 tablet (25 mg total) by mouth daily. 06/01/17  Yes Glori Luis, MD  polyethylene glycol [facility] / GLYCOLAX) packet Take 17 g by mouth daily. 08/10/17  Yes Auburn Bilberry, MD  potassium chloride (K-DUR) 10 MEQ tablet Take 1 tablet (10 mEq total) by mouth daily. 06/01/17  Yes Glori Luis, MD    Review of Systems  Constitutional: Positive for fatigue. Negative for appetite change.  HENT: Positive for hearing loss. Negative for congestion, postnasal drip and sore throat.   Eyes: Negative.   Respiratory: Negative for cough, chest tightness and shortness of breath.   Cardiovascular: Positive for leg swelling (better in the left leg). Negative for chest pain and palpitations.  Gastrointestinal: Negative for abdominal distention and abdominal pain.  Endocrine: Negative.   Genitourinary: Negative.    Musculoskeletal: Negative for back pain and neck pain.  Skin: Negative.   Allergic/Immunologic: Negative.   Neurological: Negative for dizziness and light-headedness.  Hematological: Negative for adenopathy. Does not bruise/bleed easily.  Psychiatric/Behavioral: Negative for dysphoric mood and sleep disturbance (sleeping reclined on couch). The patient is not nervous/anxious.    Vitals:   10/26/17 1102  BP: (!) 107/40  Pulse: 62  Resp: 18  SpO2: 97%  Weight: 245 lb 6 oz (111.3 kg)  Height: 5\' 11"  (1.803 m)   Wt Readings from Last 3 Encounters:  10/26/17 245 lb 6 oz (111.3 kg)  10/19/17 259 lb 8 oz (117.7 kg)  10/17/17 259 lb (117.5 kg)     Lab Results  Component Value Date   CREATININE 0.82 10/13/2017   CREATININE 0.86  09/29/2017   CREATININE 0.94 09/28/2017    Physical Exam  Constitutional: He appears well-developed and well-nourished.  HENT:  Head: Normocephalic and atraumatic.  Neck: Normal range of motion. Neck supple. No JVD present.  Cardiovascular: Regular rhythm. Bradycardia present.  Pulmonary/Chest: Effort normal. No respiratory distress. He has no wheezes. He has no rales.  Abdominal: Soft. He exhibits no distension. There is no tenderness.  Musculoskeletal: He exhibits edema (thickened skin on right lower leg with chronic venous skin changes; no edema in left lower leg). He exhibits no tenderness.  Nursing note and vitals reviewed.  Assessment & Plan:  1:Chronic heart failure with preserved ejection fraction- - NYHA class II - continues to have minimal edema in L leg. R leg has chronic lymphedema. Saw Dr. Gilda CreaseSchnier recently who gave him prescription for compression stockings; discussed importance of wearing; patient's brother said that he is going to contact the seller again about picking these up - discussed importance of wearing compression boots. Patient does not like to wear them because they make his legs feel warm. I encouraged him to wear them at least  once a [place] for an hour. Patient is agreeable to try this. I stated it could help prevent him from needing IV Lasix in the future and that it would help with the lymphedema in his R leg.  - has started weighing daily but accuracy of numbers is questionable. Instructed brother to wear his glasses as above. Brother is going to get a new scale as well. Restated importance of calling the clinic for an overnight weight gain of >2 pounds or a weekly weight gain of >5 pounds - weight down 14.2 lbs since last seen in clinic on 10/19/17. This is after IV Lasix two weeks ago and short course of metolazone last week.  - not adding salt to his food. Brother does the cooking/shopping and has been trying to cook low sodium items. Reviewed the importance of closely following a 2000mg  sodium diet.  - Patient states that he is down to 2 cups of coffee and 3-4 water bottles a [place] - encouraged to continue with this and to watch fluid intake - saw cardiology Mariah Milling([place]) 08/18/17 - last BNP 10/13/17 was 127 which is a decrease from 163 on 08/06/17 - PharmD reconciled medications with the patient - will obtain BMP today after short course of metolazone last week.  2: HTN- - BP looks ok today - saw PCP Birdie Sons([place]) 09/06/17 - BMP 10/13/17 reviewed and showed sodium 139, potassium 3.7 and GFR > 60/SCr 0.82 - BMP ordered today  3: Diabetes-  - currently diet controlled - A1c on 09/27/17 was 5.5%  4: Lymphedema- - has not been using his compression pumps but does have them - encouraged him to use these at least once [place] for an hour and try to work up from there. Patient states that he does not like wearing them because they make his legs feel warm. - hasn't been propping his legs up much and he was encouraged to prop them up when he's sitting for long periods of time; definitely needs to elevate them when using compression pumps - saw vascular Dr. Lorretta HarpSchneir on 10/17/17 who prescribed compression stockings. Brother is trying to get  these. He has a follow-up appointment scheduled on 01/05/18  Patient did not bring his medications with him this time. Each medication was verbally reviewed with the patient.  Returning on 12/26/17 for follow-up or sooner if problems arise.   Yolanda BonineHannah Lifsey, PharmD Pharmacy Resident

## 2017-10-26 NOTE — Telephone Encounter (Signed)
Talked with patient's brother, Gabriel RungJoe Gorum, who accompanies patient to his appointment regarding lab results obtained today (10/26/17). Renal function is stable although his potassium level has dropped to 2.5.   He was recently treated with metolazone and his brother doesn't think that patient took extra potassium tablets while taking the metolazone.   Advised Joe to have patient take an extra 3 potassium tablets today (30meq) and then take 2 tablets daily for the next week. Will recheck labs on 11/07/17.

## 2017-10-27 ENCOUNTER — Encounter: Payer: Self-pay | Admitting: Family

## 2017-11-08 ENCOUNTER — Encounter
Admission: RE | Admit: 2017-11-08 | Discharge: 2017-11-08 | Disposition: A | Discharge status: Home / Self Care | Payer: Medicare Other | Source: Ambulatory Visit | Attending: Family | Admitting: Family

## 2017-11-08 DIAGNOSIS — L03115 Cellulitis of right lower limb: Secondary | ICD-10-CM | POA: Diagnosis not present

## 2017-11-08 DIAGNOSIS — I5032 Chronic diastolic (congestive) heart failure: Secondary | ICD-10-CM | POA: Diagnosis not present

## 2017-11-08 DIAGNOSIS — Z01812 Encounter for preprocedural laboratory examination: Secondary | ICD-10-CM | POA: Insufficient documentation

## 2017-11-08 DIAGNOSIS — I110 Hypertensive heart disease with heart failure: Secondary | ICD-10-CM | POA: Diagnosis not present

## 2017-11-08 DIAGNOSIS — I890 Lymphedema, not elsewhere classified: Secondary | ICD-10-CM | POA: Diagnosis not present

## 2017-11-08 DIAGNOSIS — I2510 Atherosclerotic heart disease of native coronary artery without angina pectoris: Secondary | ICD-10-CM | POA: Diagnosis not present

## 2017-11-08 DIAGNOSIS — I83813 Varicose veins of bilateral lower extremities with pain: Secondary | ICD-10-CM | POA: Diagnosis not present

## 2017-11-08 LAB — BASIC METABOLIC PANEL
Anion gap: 5 (ref 5–15)
BUN: 9 mg/dL (ref 8–23)
CHLORIDE: 104 mmol/L (ref 98–111)
CO2: 30 mmol/L (ref 22–32)
Calcium: 8.8 mg/dL — ABNORMAL LOW (ref 8.9–10.3)
Creatinine, Ser: 0.82 mg/dL (ref 0.61–1.24)
GFR calc non Af Amer: >60 mL/min (ref >60)
Glucose, Bld: 114 mg/dL — ABNORMAL HIGH (ref 70–99)
POTASSIUM: 3.6 mmol/L (ref 3.5–5.1)
SODIUM: 139 mmol/L (ref 135–145)

## 2017-11-10 ENCOUNTER — Telehealth: Payer: Self-pay | Admitting: Family Medicine

## 2017-11-10 NOTE — Telephone Encounter (Signed)
Copied from CRM [phone]#[id number]. Topic: Inquiry >> Nov 10, 2017  9:57 AM Crist InfanteHarrald, Kathy J wrote: Reason for CRM: [facility] , requesting verbal orders for OT  2 wk 1 1 wk 3

## 2017-11-10 NOTE — Telephone Encounter (Signed)
Called and left voicemail for Beaulah CorinSusan Davis at [facility], verbal orders for OT for [name] and for her to call the office if she has any questions or concerns.

## 2017-11-11 DIAGNOSIS — I110 Hypertensive heart disease with heart failure: Secondary | ICD-10-CM | POA: Diagnosis not present

## 2017-11-11 DIAGNOSIS — L03115 Cellulitis of right lower limb: Secondary | ICD-10-CM | POA: Diagnosis not present

## 2017-11-11 DIAGNOSIS — I890 Lymphedema, not elsewhere classified: Secondary | ICD-10-CM | POA: Diagnosis not present

## 2017-11-11 DIAGNOSIS — I2510 Atherosclerotic heart disease of native coronary artery without angina pectoris: Secondary | ICD-10-CM | POA: Diagnosis not present

## 2017-11-11 DIAGNOSIS — I5032 Chronic diastolic (congestive) heart failure: Secondary | ICD-10-CM | POA: Diagnosis not present

## 2017-11-11 DIAGNOSIS — I83813 Varicose veins of bilateral lower extremities with pain: Secondary | ICD-10-CM | POA: Diagnosis not present

## 2017-11-17 DIAGNOSIS — I2510 Atherosclerotic heart disease of native coronary artery without angina pectoris: Secondary | ICD-10-CM | POA: Diagnosis not present

## 2017-11-17 DIAGNOSIS — L03115 Cellulitis of right lower limb: Secondary | ICD-10-CM | POA: Diagnosis not present

## 2017-11-17 DIAGNOSIS — I890 Lymphedema, not elsewhere classified: Secondary | ICD-10-CM | POA: Diagnosis not present

## 2017-11-17 DIAGNOSIS — I5032 Chronic diastolic (congestive) heart failure: Secondary | ICD-10-CM | POA: Diagnosis not present

## 2017-11-17 DIAGNOSIS — I83813 Varicose veins of bilateral lower extremities with pain: Secondary | ICD-10-CM | POA: Diagnosis not present

## 2017-11-17 DIAGNOSIS — I110 Hypertensive heart disease with heart failure: Secondary | ICD-10-CM | POA: Diagnosis not present

## 2017-11-21 DIAGNOSIS — L03115 Cellulitis of right lower limb: Secondary | ICD-10-CM | POA: Diagnosis not present

## 2017-11-21 DIAGNOSIS — I5032 Chronic diastolic (congestive) heart failure: Secondary | ICD-10-CM | POA: Diagnosis not present

## 2017-11-21 DIAGNOSIS — I2510 Atherosclerotic heart disease of native coronary artery without angina pectoris: Secondary | ICD-10-CM | POA: Diagnosis not present

## 2017-11-21 DIAGNOSIS — I110 Hypertensive heart disease with heart failure: Secondary | ICD-10-CM | POA: Diagnosis not present

## 2017-11-21 DIAGNOSIS — I890 Lymphedema, not elsewhere classified: Secondary | ICD-10-CM | POA: Diagnosis not present

## 2017-11-21 DIAGNOSIS — I83813 Varicose veins of bilateral lower extremities with pain: Secondary | ICD-10-CM | POA: Diagnosis not present

## 2017-11-22 DIAGNOSIS — I83813 Varicose veins of bilateral lower extremities with pain: Secondary | ICD-10-CM | POA: Diagnosis not present

## 2017-11-22 DIAGNOSIS — I5032 Chronic diastolic (congestive) heart failure: Secondary | ICD-10-CM | POA: Diagnosis not present

## 2017-11-22 DIAGNOSIS — I2510 Atherosclerotic heart disease of native coronary artery without angina pectoris: Secondary | ICD-10-CM | POA: Diagnosis not present

## 2017-11-22 DIAGNOSIS — I890 Lymphedema, not elsewhere classified: Secondary | ICD-10-CM | POA: Diagnosis not present

## 2017-11-22 DIAGNOSIS — I110 Hypertensive heart disease with heart failure: Secondary | ICD-10-CM | POA: Diagnosis not present

## 2017-11-22 DIAGNOSIS — L03115 Cellulitis of right lower limb: Secondary | ICD-10-CM | POA: Diagnosis not present

## 2017-12-07 ENCOUNTER — Encounter: Payer: Self-pay | Admitting: Family Medicine

## 2017-12-07 ENCOUNTER — Ambulatory Visit (INDEPENDENT_AMBULATORY_CARE_PROVIDER_SITE_OTHER): Payer: Medicare Other | Admitting: Family Medicine

## 2017-12-07 VITALS — BP 138/60 | HR 91 | Temp 98.9°F | Ht 71.0 in | Wt 257.8 lb

## 2017-12-07 DIAGNOSIS — I5032 Chronic diastolic (congestive) heart failure: Secondary | ICD-10-CM | POA: Diagnosis not present

## 2017-12-07 DIAGNOSIS — I2510 Atherosclerotic heart disease of native coronary artery without angina pectoris: Secondary | ICD-10-CM

## 2017-12-07 DIAGNOSIS — E785 Hyperlipidemia, unspecified: Secondary | ICD-10-CM | POA: Diagnosis not present

## 2017-12-07 DIAGNOSIS — L03115 Cellulitis of right lower limb: Secondary | ICD-10-CM | POA: Diagnosis not present

## 2017-12-07 LAB — COMPREHENSIVE METABOLIC PANEL
ALT: 10 U/L (ref 0–53)
AST: 13 U/L (ref 0–37)
Albumin: 3.4 g/dL — ABNORMAL LOW (ref 3.5–5.2)
Alkaline Phosphatase: 73 U/L (ref 39–117)
BUN: 13 mg/dL (ref 6–23)
CHLORIDE: 104 meq/L (ref 96–112)
CO2: 34 meq/L — AB (ref 19–32)
Calcium: 9 mg/dL (ref 8.4–10.5)
Creatinine, Ser: 0.73 mg/dL (ref 0.40–1.50)
GFR: 131.33 mL/min (ref >60.00)
GLUCOSE: 118 mg/dL — AB (ref 70–99)
POTASSIUM: 3.3 meq/L — AB (ref 3.5–5.1)
SODIUM: 144 meq/L (ref 135–145)
Total Bilirubin: 1.5 mg/dL — ABNORMAL HIGH (ref 0.2–1.2)
Total Protein: 6.5 g/dL (ref 6.0–8.3)

## 2017-12-07 LAB — LIPID PANEL
CHOLESTEROL: 123 mg/dL (ref 0–200)
HDL: 43.2 mg/dL (ref >39.00)
LDL CALC: 68 mg/dL (ref 0–99)
NONHDL: 79.86
Total CHOL/HDL Ratio: 3
Triglycerides: 57 mg/dL (ref 0.0–149.0)
VLDL: 11.4 mg/dL (ref 0.0–40.0)

## 2017-12-07 NOTE — Patient Instructions (Signed)
Nice to see you. We will obtain some lab work today and contact you with the results. You need to continue to follow with your heart failure specialist as well as vascular surgery regarding your swelling. If you develop signs of skin infection such as spreading redness, increased pain, warmth, fever, or any new or changing symptoms please seek medical attention immediately.

## 2017-12-07 NOTE — Assessment & Plan Note (Addendum)
This is resolved.  No signs of recurrence.  He will monitor and if he has recurrent symptoms be reevaluated.

## 2017-12-07 NOTE — Assessment & Plan Note (Signed)
Overall asymptomatic though has had some weight gain.  We will check lab work.  Consider having him take an extra dose of Lasix once his labs returns.  We will contact him with those results.

## 2017-12-07 NOTE — Assessment & Plan Note (Signed)
Check lab work.  Continue Lipitor. 

## 2017-12-07 NOTE — Progress Notes (Signed)
  Tommi Rumps, MD Phone: [phone]  [name] is a 84 y.o. male who presents today for f/u.  CC: HLD, history of cellulitis, HF  Hyperlipidemia: Taking Lipitor.  No chest pain or shortness of breath.  Heart failure: Patient has chronic swelling in his legs right greater than left.  Lymphedema contributes.  Notes the right leg never improves all the way better.  No orthopnea or PND.  Weight is up slightly.  He is taking Lasix daily.  He has chronic skin thickening right greater than left lower legs.  He is supposed to wear compression stockings though does not. He does have lymphedema pumps at home though does not necessarily consistently use them.  He does take his Lasix daily.  Previously he was given several days of metolazone and his weight did go down.  He is back up near his previous baseline weight.  He was treated for cellulitis in the hospital.  That has resolved and not recurred.  He was on doxycycline and Keflex.  Social History   Tobacco Use  Smoking Status Former Smoker  . Packs/Dillen: 1.50  . Years: 20.00  . Pack years: 30.00  . Types: Cigarettes  . Last attempt to quit: 02/22/1981  . Years since quitting: 36.8  Smokeless Tobacco Never Used     ROS see history of present illness  Objective  Physical Exam Vitals:   12/07/17 1314  BP: 138/60  Pulse: 91  Temp: 98.9 F (37.2 C)  SpO2: 95%    BP Readings from Last 3 Encounters:  12/07/17 138/60  10/26/17 (!) 107/40  10/19/17 (!) 114/43   Wt Readings from Last 3 Encounters:  12/07/17 257 lb 12.8 oz (116.9 kg)  10/26/17 245 lb 6 oz (111.3 kg)  10/19/17 259 lb 8 oz (117.7 kg)    Physical Exam  Constitutional: No distress.  Cardiovascular: Normal rate, regular rhythm and normal heart sounds.  Pulmonary/Chest: Effort normal and breath sounds normal.  Musculoskeletal:  Chronic venous stasis changes with thickening of his skin in his right lower leg with chronic swelling, left lower extremity with trace  to 1+ edema with chronic venous stasis changes  Neurological: He is alert.  Skin: Skin is warm and dry. He is not diaphoretic.  No signs of cellulitis in his lower extremities   Assessment/Plan: Please see individual problem list.  Chronic diastolic heart failure ([place]) Overall asymptomatic though has had some weight gain.  We will check lab work.  Consider having him take an extra dose of Lasix once his labs returns.  We will contact him with those results.  Cellulitis of right leg This is resolved.  No signs of recurrence.  He will monitor and if he has recurrent symptoms be reevaluated.  Hyperlipidemia Check lab work.  Continue Lipitor.   Orders Placed This Encounter  Procedures  . Comp Met (CMET)  . Lipid panel    No orders of the defined types were placed in this encounter.    Tommi Rumps, MD [place]

## 2017-12-08 ENCOUNTER — Other Ambulatory Visit: Payer: Self-pay | Admitting: Family Medicine

## 2017-12-08 DIAGNOSIS — E876 Hypokalemia: Secondary | ICD-10-CM

## 2017-12-08 DIAGNOSIS — R17 Unspecified jaundice: Secondary | ICD-10-CM

## 2017-12-09 ENCOUNTER — Telehealth: Payer: Self-pay | Admitting: Family Medicine

## 2017-12-09 ENCOUNTER — Telehealth: Payer: Self-pay | Admitting: Cardiovascular Disease

## 2017-12-09 NOTE — Telephone Encounter (Signed)
I called and spoke with the patient's daughter Osie BondLawanner. She states she has been in the hospital recently herself and is just getting out. She was home last night and noticed the patient with a dry hacking cough yesterday and through the night.  The patient has been on lisinopril for a while. I advised her the dry cough could be coming from the lisinopril. The patient is otherwise asymptomatic.  I advised that she have the patient continue lisinopril until we can receive recommendations from Dr. Mariah MillingGollan. Osie BondLawanner is agreeable and voices understanding.

## 2017-12-09 NOTE — Telephone Encounter (Signed)
Patient notified and states he just picked up a [address] rx for the lasix and would like to use this first. Patients daughter states [facility] and speak to her about this. She states she will let us know if swelling and weight gain worsens

## 2017-12-09 NOTE — Telephone Encounter (Signed)
Please contact the patient and let him know [facility].  She mentioned possibly switching him from Lasix to torsemide to see if that would help with his swelling and weight gain.  She also mentioned that he needs to use his compression pumps as that would be beneficial.  Please find out if the patient is willing to switch to torsemide and if he is I can send in this medication.  Thanks.

## 2017-12-09 NOTE — Telephone Encounter (Signed)
Patient c/o dry cough due to lisinopril.  Please call To discuss.

## 2017-12-09 NOTE — Telephone Encounter (Signed)
-----   Message from Delma Freezeina A Hackney, [place] sent at 12/09/2017  8:24 AM EDT ----- Regarding: RE: question about patient Dr. Birdie SonsSonnenberg,  I would consider changing his diuretic to torsemide. We've seen patients have a good response with just switching the diuretic. I hate to keep giving him metolazone/ IV lasix with his potassium already low.   He has compression pumps at home that would also help his edema if he would use them....  Hope that helps,  Inetta Fermoina   ----- Message ----- From: Glori LuisSonnenberg, Zamari Vea G, MD Sent: 12/08/2017   3:17 PM To: Delma Freezeina A Hackney, FNP Subject: question about patient                         Tyler AasHi Tina,   I saw Mr Luzier yesterday for follow-up. He noted he felt well and has not had any dyspnea, orthopnea, or PND. He reported he has been taking his lasix appropriately. He has gained 12 pounds since he was in your office about a month and a half ago. He had some swelling in his LE R>L that to me appears improved from the last time I saw him though is still present. I wanted to get your input with regards to his weight gain and see if there was any intervention you felt was necessary. It looks like the last several times he saw you all he was given IV lasix and metolazone. Please let me know what you think and if you have any questions. Thanks for your help.   Marikay AlarEric Stratton [place]  ----- Message ----- From: Interface, [facility] In [facility] One Sent: 12/07/2017   4:35 PM To: Glori LuisEric G [place] Veldman, MD

## 2017-12-09 NOTE — Telephone Encounter (Signed)
Noted  

## 2017-12-11 NOTE — Telephone Encounter (Signed)
He was seen by PMD 2 days prior to daughters note Nothing mentioned in his note about cough, from Dr. Birdie Sonssonnenberg Cough could come from fluid, prior smoking hx among other things Would talk with patient and get more details

## 2017-12-12 ENCOUNTER — Other Ambulatory Visit: Payer: Self-pay | Admitting: Family

## 2017-12-12 MED ORDER — POTASSIUM CHLORIDE ER 10 MEQ PO TBCR: 10.0000 meq | EXTENDED_RELEASE_TABLET | Freq: Two times a day (BID) | ORAL | 3 refills | Status: DC

## 2017-12-12 NOTE — Telephone Encounter (Signed)
Patient says he usually gets the cough all throughout the [place]. Daughter said she talked to the pharmacist who thinks it could be part of the Lisinopril. She really would like to know what the patient can take instead of the Lisinopril. Advised I will route to Dr Mariah MillingGollan for his advice.

## 2017-12-14 ENCOUNTER — Other Ambulatory Visit (INDEPENDENT_AMBULATORY_CARE_PROVIDER_SITE_OTHER): Payer: Medicare Other

## 2017-12-14 DIAGNOSIS — E876 Hypokalemia: Secondary | ICD-10-CM

## 2017-12-14 DIAGNOSIS — R17 Unspecified jaundice: Secondary | ICD-10-CM | POA: Diagnosis not present

## 2017-12-14 NOTE — Telephone Encounter (Signed)
Okay to hold lisinopril Would start losartan 25 mgrams daily instead This should help with getting rid of a cough

## 2017-12-14 NOTE — Addendum Note (Signed)
Addended by: Penne LashWIGGINS, Asante Ritacco N on: 12/14/2017 01:55 PM   Modules accepted: Orders

## 2017-12-15 ENCOUNTER — Telehealth: Payer: Self-pay

## 2017-12-15 DIAGNOSIS — R17 Unspecified jaundice: Secondary | ICD-10-CM

## 2017-12-15 LAB — BILIRUBIN, FRACTIONATED(TOT/DIR/INDIR)
BILIRUBIN INDIRECT: 0.7 mg/dL (ref 0.2–1.2)
Bilirubin, Direct: 0.3 mg/dL — ABNORMAL HIGH (ref 0.0–0.2)
Total Bilirubin: 1 mg/dL (ref 0.2–1.2)

## 2017-12-15 LAB — BASIC METABOLIC PANEL
BUN: 12 mg/dL (ref 7–25)
CALCIUM: 8.9 mg/dL (ref 8.6–10.3)
CHLORIDE: 104 mmol/L (ref 98–110)
CO2: 32 mmol/L (ref 20–32)
Creat: 0.72 mg/dL (ref 0.70–1.11)
GLUCOSE: 91 mg/dL (ref 65–99)
POTASSIUM: 3.6 mmol/L (ref 3.5–5.3)
SODIUM: 145 mmol/L (ref 135–146)

## 2017-12-15 MED ORDER — LOSARTAN POTASSIUM 25 MG PO TABS: 25.0000 mg | ORAL_TABLET | Freq: Every day | ORAL | 3 refills | Status: DC

## 2017-12-15 NOTE — Telephone Encounter (Signed)
-----   Message from Glori LuisEric G Sonnenberg, MD sent at 12/15/2017  9:21 AM EDT ----- Please let the patient know that one of his types of bilirubin was elevated though his overall bilirubin is improved. . I would like to recheck this in several weeks to see if it returns to normal. If it does not we would need to consider a GI referral. His other labs are acceptable. Please place an order for a fractionated bilirubin for elevated bilirubin. Thanks.

## 2017-12-15 NOTE — Telephone Encounter (Signed)
Spoke with patients daughter per release form and reviewed recommendations to hold  Lisinopril and start Losartan 25 mg once daily. She verbalized understanding with no further questions at this time.

## 2017-12-26 ENCOUNTER — Encounter: Payer: Self-pay | Admitting: Family

## 2017-12-26 ENCOUNTER — Ambulatory Visit: Payer: Medicare Other | Attending: Family | Admitting: Family

## 2017-12-26 VITALS — BP 144/45 | HR 63 | Resp 18 | Ht 71.0 in | Wt 256.5 lb

## 2017-12-26 DIAGNOSIS — J45909 Unspecified asthma, uncomplicated: Secondary | ICD-10-CM | POA: Diagnosis not present

## 2017-12-26 DIAGNOSIS — Z886 Allergy status to analgesic agent status: Secondary | ICD-10-CM | POA: Insufficient documentation

## 2017-12-26 DIAGNOSIS — E785 Hyperlipidemia, unspecified: Secondary | ICD-10-CM | POA: Diagnosis not present

## 2017-12-26 DIAGNOSIS — M1990 Unspecified osteoarthritis, unspecified site: Secondary | ICD-10-CM | POA: Insufficient documentation

## 2017-12-26 DIAGNOSIS — I110 Hypertensive heart disease with heart failure: Secondary | ICD-10-CM | POA: Insufficient documentation

## 2017-12-26 DIAGNOSIS — Z79899 Other long term (current) drug therapy: Secondary | ICD-10-CM | POA: Diagnosis not present

## 2017-12-26 DIAGNOSIS — K219 Gastro-esophageal reflux disease without esophagitis: Secondary | ICD-10-CM | POA: Diagnosis not present

## 2017-12-26 DIAGNOSIS — I890 Lymphedema, not elsewhere classified: Secondary | ICD-10-CM | POA: Diagnosis not present

## 2017-12-26 DIAGNOSIS — I5032 Chronic diastolic (congestive) heart failure: Secondary | ICD-10-CM | POA: Insufficient documentation

## 2017-12-26 DIAGNOSIS — E119 Type 2 diabetes mellitus without complications: Secondary | ICD-10-CM | POA: Insufficient documentation

## 2017-12-26 DIAGNOSIS — I2510 Atherosclerotic heart disease of native coronary artery without angina pectoris: Secondary | ICD-10-CM | POA: Insufficient documentation

## 2017-12-26 DIAGNOSIS — E669 Obesity, unspecified: Secondary | ICD-10-CM | POA: Diagnosis not present

## 2017-12-26 DIAGNOSIS — K449 Diaphragmatic hernia without obstruction or gangrene: Secondary | ICD-10-CM | POA: Insufficient documentation

## 2017-12-26 DIAGNOSIS — Z87891 Personal history of nicotine dependence: Secondary | ICD-10-CM | POA: Diagnosis not present

## 2017-12-26 DIAGNOSIS — I252 Old myocardial infarction: Secondary | ICD-10-CM | POA: Insufficient documentation

## 2017-12-26 DIAGNOSIS — I10 Essential (primary) hypertension: Secondary | ICD-10-CM

## 2017-12-26 DIAGNOSIS — E1159 Type 2 diabetes mellitus with other circulatory complications: Secondary | ICD-10-CM

## 2017-12-26 NOTE — Patient Instructions (Signed)
Resume weighing daily and call for an overnight weight gain of > 2 pounds or a weekly weight gain of >5 pounds. 

## 2017-12-26 NOTE — Progress Notes (Signed)
Patient ID: Archie PattenMonroe Bara, male    DOB: 1930/07/11, 84 y.o.   MRN: [id number]  HPI  Mr Scalici is a 84 y/o male with a history of asthma, CAD, hyperlipidemia, HTN, GERD, lymphedema, remote tobacco use and chronic heart failure.   Echo report from 11/10/16 reviewed and showed an EF of 65-70% along with moderate MR and a PA pressure of 25-30 mm Hg.   Admitted 09/26/17 - 09/29/17 with cellulitis and UTI. Patient initially started on vancomyin and zosyn, narrowed to ceftriaxone, and was discharged with doxycycline and Keflex after 3 days. Admitted 08/06/17 due to HF exacerbation along with cellulitis. Initially given IV antibiotics and lasix and then transitioned to oral medications. Cardiology and wound consults obtained. Discharged after 4 days. Was in the ED 08/05/17 due to sinusitis where he was evaluated and released.   He presents today for a follow-up visit with a chief complaint of minimal fatigue upon moderate exertion. He says this has been present for several years. He has associated lymphedema and hearing loss. He denies any difficulty sleeping, abdominal distention, palpitations, chest pain, shortness of breath, cough or dizziness. Has moved to daughter's house and hasn't been weighing daily as he doesn't have any scales there.   Past Medical History:  Diagnosis Date  . Aortic atherosclerosis (HCC) 05/2013   Per TEE  . Arrhythmia   . Asthma   . CHF (congestive heart failure) (HCC)   . Colon polyp   . Coronary artery disease    a. Nonobstructive by 2004 cath b. Nonobstructive by 2015 cath in setting of NSTEMI  . GERD (gastroesophageal reflux disease)   . Heart murmur   . Hiatal hernia   . Hyperlipidemia   . Hypertension   . Lymphedema   . Moderate aortic stenosis    a. echo 2013: EF 55%, nl wall motion, mild DD, mild LVH, trace MR, moderate AS with peak gradient of 55 mm Hg; b. TEE 05/2013: EF EF 60-65%, borderline LVH, mild MR/AI, mod AS with valve area 1.0-1.26; c. echo 10/2014: 55-60%, no  RWMA, GR1DD, mod AS w/ mean gradient of 28 mm Hg     . Obesity   . Onychomycosis   . Osteoarthritis    Past Surgical History:  Procedure Laterality Date  . CARDIAC CATHETERIZATION  11/2002  . CARDIAC CATHETERIZATION  05/21/2013   showing 30% oLM, 30% D1, 20% mLCx, 30% pRCA; EF 60%, severe AS (mean gradient 28 mmHg, peak 26, area 0.9)  . CHOLECYSTECTOMY  2001  . TRANSESOPHAGEAL ECHOCARDIOGRAM  05/22/2013   Preserved EF, moderate AS (valve area by planimetry between 1.0-1.26 cm sq), moderate aortic arch and descending aortic atherosclerosis.   . TRANSTHORACIC ECHOCARDIOGRAM  05/19/2013   EF 60-65%, impaired diastolic function, mild LA dilatation, mild MR/AI/TR, mod AS, high normal RVSP (30.4 mmHg)   Family History  Problem Relation Age of Onset  . Heart disease Mother   . Heart disease Father 160  . Stroke Brother    Social History   Tobacco Use  . Smoking status: Former Smoker    Packs/Graham: 1.50    Years: 20.00    Pack years: 30.00    Types: Cigarettes    Last attempt to quit: 02/22/1981    Years since quitting: 36.8  . Smokeless tobacco: Never Used  Substance Use Topics  . Alcohol use: No   Allergies  Allergen Reactions  . Aspirin Rash and Other (See Comments)    Reaction: Trouble breathing    Prior to Admission  medications   Medication Sig Start Date End Date Taking? Authorizing Provider  acetaminophen (TYLENOL) 325 MG tablet Take 2 tablets (650 mg total) by mouth every 6 (six) hours as needed for mild pain (or Fever >/= 101). 09/29/17  Yes Rodolph Bong, MD  atorvastatin (LIPITOR) 80 MG tablet Take 1 tablet (80 mg total) by mouth daily. 10/19/17  Yes Clarisa Kindred A, FNP  clopidogrel (PLAVIX) 75 MG tablet Take 1 tablet (75 mg total) by mouth daily. 10/19/17  Yes Clarisa Kindred A, FNP  docusate sodium (COLACE) 100 MG capsule Take 2 capsules (200 mg total) by mouth 2 (two) times daily. 08/10/17  Yes Auburn Bilberry, MD  ferrous sulfate 325 (65 FE) MG EC tablet Take 1 tablet (325  mg total) by mouth 2 (two) times daily. 06/06/17  Yes Glori Luis, MD  furosemide (LASIX) 40 MG tablet Take 1 tablet (40 mg total) by mouth 2 (two) times daily. 10/19/17  Yes Cadence Haslam A, FNP  hydrocerin (EUCERIN) CREA Apply 1 application topically 2 (two) times daily. Apply to bilateral LEs and feet twice daily after cleansing with house skin cleanser and gently patting dry. No not apply between toes. 08/10/17  Yes Auburn Bilberry, MD  losartan (COZAAR) 25 MG tablet Take 1 tablet (25 mg total) by mouth daily. 12/15/17 03/15/18 Yes Gollan, Tollie Pizza, MD  metoprolol succinate (TOPROL-XL) 25 MG 24 hr tablet Take 1 tablet (25 mg total) by mouth daily. 10/19/17  Yes Sakib Noguez, Inetta Fermo A, FNP  polyethylene glycol (MIRALAX / GLYCOLAX) packet Take 17 g by mouth daily. 08/10/17  Yes Auburn Bilberry, MD  potassium chloride (K-DUR) 10 MEQ tablet Take 1 tablet (10 mEq total) by mouth 2 (two) times daily. 12/12/17  Yes Delma Freeze, FNP    Review of Systems  Constitutional: Positive for fatigue. Negative for appetite change.  HENT: Positive for hearing loss. Negative for congestion, postnasal drip and sore throat.   Eyes: Negative.   Respiratory: Negative for cough, chest tightness and shortness of breath.   Cardiovascular: Positive for leg swelling (improving). Negative for chest pain and palpitations.  Gastrointestinal: Negative for abdominal distention and abdominal pain.  Endocrine: Negative.   Genitourinary: Negative.   Musculoskeletal: Negative for back pain and neck pain.  Skin: Negative.   Allergic/Immunologic: Negative.   Neurological: Negative for dizziness and light-headedness.  Hematological: Negative for adenopathy. Does not bruise/bleed easily.  Psychiatric/Behavioral: Negative for dysphoric mood and sleep disturbance. The patient is not nervous/anxious.    Vitals:   12/26/17 1054  BP: (!) 144/45  Pulse: 63  Resp: 18  SpO2: 100%  Weight: 256 lb 8 oz (116.3 kg)  Height: 5\' 11"  (1.803  m)   Wt Readings from Last 3 Encounters:  12/26/17 256 lb 8 oz (116.3 kg)  12/07/17 257 lb 12.8 oz (116.9 kg)  10/26/17 245 lb 6 oz (111.3 kg)   Lab Results  Component Value Date   CREATININE 0.72 12/14/2017   CREATININE 0.73 12/07/2017   CREATININE 0.82 11/08/2017    Physical Exam  Constitutional: He appears well-developed and well-nourished.  HENT:  Head: Normocephalic and atraumatic.  Neck: Normal range of motion. Neck supple. No JVD present.  Cardiovascular: Normal rate and regular rhythm.  Pulmonary/Chest: Effort normal. No respiratory distress. He has no wheezes. He has no rales.  Abdominal: Soft. He exhibits no distension. There is no tenderness.  Musculoskeletal: He exhibits edema (thickened skin on right lower leg with chronic venous skin changes; 1+pitting edema in left lower  leg). He exhibits no tenderness.  Nursing note and vitals reviewed.  Assessment & Plan:  1:Chronic heart failure with preserved ejection fraction- - NYHA class II - minimally fluid overloaded today - has moved to daughter's house and currently doesn't have a scale. Brother who is with him says that he'll get the scales he was using at his house and take them to him. Reviewed the importance of weighing daily and calling for an overnight weight gain of >2 pounds or a weekly weight gain of >5 pounds - weight up 9 pounds since he was last here ~ 2 months ago - not adding salt to his food. Reviewed the importance of closely following a 2000mg  sodium diet.  - saw cardiology Mariah Milling(Gollan) 08/18/17 - last BNP 10/13/17 was 127 which is a decrease from 163 on 08/06/17  2: HTN- - BP looks ok today - saw PCP Birdie Sons(Sonnenberg) 12/07/17 - BMP 12/14/17 reviewed and showed sodium 145, potassium 3.6 and GFR SCr 0.72  3: Diabetes-  - currently diet controlled - A1c on 09/27/17 was 5.5%  4: Lymphedema- - says that he's wearing the compression pumps 1-2 times a [place] but his brother doesn't think he is - encouraged him to wear  them twice daily for about an hour each time - hasn't been propping his legs up much and he was encouraged to prop them up when he's sitting for long periods of time; definitely needs to elevate them when using compression pumps - saw vascular Dr. Lorretta HarpSchneir on 10/17/17 who prescribed compression stockings. Brother is trying to get these. He has a follow-up appointment scheduled on 01/05/18  Patient did not bring his medications nor a list. Each medication was verbally reviewed with the patient and he was encouraged to bring the bottles to every visit to confirm accuracy of list.  Return in 4 months or sooner for any questions/problems before then.

## 2018-01-03 ENCOUNTER — Other Ambulatory Visit (INDEPENDENT_AMBULATORY_CARE_PROVIDER_SITE_OTHER): Payer: Medicare Other

## 2018-01-03 DIAGNOSIS — R17 Unspecified jaundice: Secondary | ICD-10-CM

## 2018-01-05 ENCOUNTER — Ambulatory Visit (INDEPENDENT_AMBULATORY_CARE_PROVIDER_SITE_OTHER): Payer: Medicare Other | Admitting: Vascular Surgery

## 2018-01-05 ENCOUNTER — Encounter (INDEPENDENT_AMBULATORY_CARE_PROVIDER_SITE_OTHER): Payer: Self-pay | Admitting: Vascular Surgery

## 2018-01-05 VITALS — BP 133/57 | HR 97 | Resp 16 | Ht 71.0 in | Wt 265.5 lb

## 2018-01-05 DIAGNOSIS — E1159 Type 2 diabetes mellitus with other circulatory complications: Secondary | ICD-10-CM | POA: Diagnosis not present

## 2018-01-05 DIAGNOSIS — I872 Venous insufficiency (chronic) (peripheral): Secondary | ICD-10-CM

## 2018-01-05 DIAGNOSIS — I890 Lymphedema, not elsewhere classified: Secondary | ICD-10-CM

## 2018-01-05 DIAGNOSIS — I10 Essential (primary) hypertension: Secondary | ICD-10-CM

## 2018-01-05 DIAGNOSIS — I2510 Atherosclerotic heart disease of native coronary artery without angina pectoris: Secondary | ICD-10-CM

## 2018-01-07 ENCOUNTER — Encounter (INDEPENDENT_AMBULATORY_CARE_PROVIDER_SITE_OTHER): Payer: Self-pay | Admitting: Vascular Surgery

## 2018-01-07 NOTE — Progress Notes (Signed)
MRN : [id number]  [name] is a 84 y.o. ([birth year]) male who presents with chief complaint of  Chief Complaint  Patient presents with  . Follow-up    101month no studies  .  History of Present Illness:   The patient returns to the office for followup evaluation regarding leg swelling.  The swelling has persisted but with the lymph pump is much, much better controlled. The pain associated with swelling is essentially eliminated. There have not been any interval development of a ulcerations or wounds.  The patient denies problems with the pump, noting it is working well and the leggings are in good condition.  Since the previous visit the patient has been wearing graduated compression stockings and using the lymph pump on a routine basis and  has noted significant improvement in the lymphedema.   Patient stated the lymph pump has been a very positive factor in her care.    Current Meds  Medication Sig  . acetaminophen (TYLENOL) 325 MG tablet Take 2 tablets (650 mg total) by mouth every 6 (six) hours as needed for mild pain (or Fever >/= 101).  Marland Kitchen atorvastatin (LIPITOR) 80 MG tablet Take 1 tablet (80 mg total) by mouth daily.  . clopidogrel (PLAVIX) 75 MG tablet Take 1 tablet (75 mg total) by mouth daily.  Marland Kitchen docusate sodium (COLACE) 100 MG capsule Take 2 capsules (200 mg total) by mouth 2 (two) times daily.  . ferrous sulfate 325 (65 FE) MG EC tablet Take 1 tablet (325 mg total) by mouth 2 (two) times daily.  . furosemide (LASIX) 40 MG tablet Take 1 tablet (40 mg total) by mouth 2 (two) times daily.  . hydrocerin (EUCERIN) CREA Apply 1 application topically 2 (two) times daily. Apply to bilateral LEs and feet twice daily after cleansing with house skin cleanser and gently patting dry. No not apply between toes.  Marland Kitchen losartan (COZAAR) 25 MG tablet Take 1 tablet (25 mg total) by mouth daily.  . metoprolol succinate (TOPROL-XL) 25 MG 24 hr tablet Take 1 tablet (25 mg total) by mouth daily.    . polyethylene glycol (MIRALAX / GLYCOLAX) packet Take 17 g by mouth daily.  . potassium chloride (K-DUR) 10 MEQ tablet Take 1 tablet (10 mEq total) by mouth 2 (two) times daily.    Past Medical History:  Diagnosis Date  . Aortic atherosclerosis (HCC) 05/2013   Per TEE  . Arrhythmia   . Asthma   . CHF (congestive heart failure) (HCC)   . Colon polyp   . Coronary artery disease    a. Nonobstructive by 2004 cath b. Nonobstructive by 2015 cath in setting of NSTEMI  . GERD (gastroesophageal reflux disease)   . Heart murmur   . Hiatal hernia   . Hyperlipidemia   . Hypertension   . Lymphedema   . Moderate aortic stenosis    a. echo 2013: EF 55%, nl wall motion, mild DD, mild LVH, trace MR, moderate AS with peak gradient of 55 mm Hg; b. TEE 05/2013: EF EF 60-65%, borderline LVH, mild MR/AI, mod AS with valve area 1.0-1.26; c. echo 10/2014: 55-60%, no RWMA, GR1DD, mod AS w/ mean gradient of 28 mm Hg     . Obesity   . Onychomycosis   . Osteoarthritis     Past Surgical History:  Procedure Laterality Date  . CARDIAC CATHETERIZATION  11/2002  . CARDIAC CATHETERIZATION  05/21/2013   showing 30% oLM, 30% D1, 20% mLCx, 30% pRCA; EF 60%, severe  AS (mean gradient 28 mmHg, peak 26, area 0.9)  . CHOLECYSTECTOMY  2001  . TRANSESOPHAGEAL ECHOCARDIOGRAM  05/22/2013   Preserved EF, moderate AS (valve area by planimetry between 1.0-1.26 cm sq), moderate aortic arch and descending aortic atherosclerosis.   . TRANSTHORACIC ECHOCARDIOGRAM  05/19/2013   EF 60-65%, impaired diastolic function, mild LA dilatation, mild MR/AI/TR, mod AS, high normal RVSP (30.4 mmHg)    Social History Social History   Tobacco Use  . Smoking status: Former Smoker    Packs/[place]: 1.50    Years: 20.00    Pack years: 30.00    Types: Cigarettes    Last attempt to quit: 02/22/1981    Years since quitting: 36.8  . Smokeless tobacco: Never Used  Substance Use Topics  . Alcohol use: No  . Drug use: No    Family History Family  History  Problem Relation Age of Onset  . Heart disease Mother   . Heart disease Father 39  . Stroke Brother     Allergies  Allergen Reactions  . Aspirin Rash and Other (See Comments)    Reaction: Trouble breathing      REVIEW OF SYSTEMS (Negative unless checked)  Constitutional: [] Weight loss  [] Fever  [] Chills Cardiac: [] Chest pain   [] Chest pressure   [] Palpitations   [] Shortness of breath when laying flat   [] Shortness of breath with exertion. Vascular:  [] Pain in legs with walking   [] Pain in legs at rest  [] History of DVT   [] Phlebitis   [x] Swelling in legs   [x] Varicose veins   [] Non-healing ulcers Pulmonary:   [] Uses home oxygen   [] Productive cough   [] Hemoptysis   [] Wheeze  [] COPD   [] Asthma Neurologic:  [] Dizziness   [] Seizures   [] History of stroke   [] History of TIA  [] Aphasia   [] Vissual changes   [] Weakness or numbness in arm   [] Weakness or numbness in leg Musculoskeletal:   [] Joint swelling   [] Joint pain   [] Low back pain Hematologic:  [] Easy bruising  [] Easy bleeding   [] Hypercoagulable state   [] Anemic Gastrointestinal:  [] Diarrhea   [] Vomiting  [] Gastroesophageal reflux/heartburn   [] Difficulty swallowing. Genitourinary:  [] Chronic kidney disease   [] Difficult urination  [] Frequent urination   [] Blood in urine Skin:  [x]  Venous Rashes   [] Ulcers  Psychological:  [] History of anxiety   []  History of major depression.  Physical Examination  Vitals:   01/05/18 1427  BP: (!) 133/57  Pulse: 97  Resp: 16  Weight: 265 lb 8 oz (120.4 kg)  Height: 5\' 11"  (1.803 m)   Body mass index is 37.03 kg/m. Gen: WD/WN, NAD Head: [place]/AT, No temporalis wasting.  Ear/Nose/Throat: Hearing grossly intact, nares w/o erythema or drainage Eyes: PER, EOMI, sclera nonicteric.  Neck: Supple, no large masses.   Pulmonary:  Good air movement, no audible wheezing bilaterally, no use of accessory muscles.  Cardiac: RRR, no JVD Vascular: scattered varicosities present bilaterally.   Mild venous stasis changes to the legs bilaterally.  3+ soft pitting edema Vessel Right Left  Radial Palpable Palpable  PT Palpable Palpable  DP Palpable Palpable  Gastrointestinal: Non-distended. No guarding/no peritoneal signs.  Musculoskeletal: M/S 5/5 throughout.  No deformity or atrophy.  Neurologic: CN 2-12 intact. Symmetrical.  Speech is fluent. Motor exam as listed above. Psychiatric: Judgment intact, Mood & affect appropriate for pt's clinical situation. Dermatologic: Venous rashes no ulcers noted.  No changes consistent with cellulitis. Lymph : No lichenification or skin changes of chronic lymphedema.  CBC Lab  Results  Component Value Date   WBC 5.1 09/29/2017   HGB 11.2 (L) 09/29/2017   HCT 34.4 (L) 09/29/2017   MCV 81.9 09/29/2017   PLT 142 (L) 09/29/2017    BMET    Component Value Date/Time   NA 145 12/14/2017 1355   NA 141 05/26/2014 1101   K 3.6 12/14/2017 1355   K 3.6 05/26/2014 1101   CL 104 12/14/2017 1355   CL 106 05/26/2014 1101   CO2 32 12/14/2017 1355   CO2 27 05/26/2014 1101   GLUCOSE 91 12/14/2017 1355   GLUCOSE 100 (H) 05/26/2014 1101   BUN 12 12/14/2017 1355   BUN 16 05/26/2014 1101   CREATININE 0.72 12/14/2017 1355   CALCIUM 8.9 12/14/2017 1355   CALCIUM 8.8 05/26/2014 1101   GFRNONAA >60 11/08/2017 1000   GFRNONAA >60 05/26/2014 1101   GFRNONAA >60 05/21/2013 0403   GFRAA >60 11/08/2017 1000   GFRAA >60 05/26/2014 1101   GFRAA >60 05/21/2013 0403   CrCl cannot be calculated (Patient's most recent lab result is older than the maximum 21 days allowed.).  COAG Lab Results  Component Value Date   INR 1.1 05/18/2013    Radiology No results found.    Assessment/Plan 1. Lymphedema  No surgery or intervention at this point in time.    I have reviewed my discussion with the patient regarding lymphedema and why it  causes symptoms.  Patient will continue wearing graduated compression stockings class 1 (20-30 mmHg) on a daily basis a  prescription was given. The patient is reminded to put the stockings on first thing in the morning and removing them in the evening. The patient is instructed specifically not to sleep in the stockings.   In addition, behavioral modification throughout the [place] will be continued.  This will include frequent elevation (such as in a recliner), use of over the counter pain medications as needed and exercise such as walking.  I have reviewed systemic causes for chronic edema such as liver, kidney and cardiac etiologies and there does not appear to be any significant changes in these organ systems over the past year.  The patient is under the impression that these organ systems are all stable and unchanged.    The patient will continue aggressive use of the  lymph pump.  This will continue to improve the edema control and prevent sequela such as ulcers and infections.   The patient will follow-up with me on an annual basis.    2. Chronic venous insufficiency  No surgery or intervention at this point in time.    I have reviewed my discussion with the patient regarding lymphedema and why it  causes symptoms.  Patient will continue wearing graduated compression stockings class 1 (20-30 mmHg) on a daily basis a prescription was given. The patient is reminded to put the stockings on first thing in the morning and removing them in the evening. The patient is instructed specifically not to sleep in the stockings.   In addition, behavioral modification throughout the [place] will be continued.  This will include frequent elevation (such as in a recliner), use of over the counter pain medications as needed and exercise such as walking.  I have reviewed systemic causes for chronic edema such as liver, kidney and cardiac etiologies and there does not appear to be any significant changes in these organ systems over the past year.  The patient is under the impression that these organ systems are all stable and unchanged.  The patient will continue aggressive use of the  lymph pump.  This will continue to improve the edema control and prevent sequela such as ulcers and infections.   The patient will follow-up with me on an annual basis.    3. Essential hypertension Continue antihypertensive medications as already ordered, these medications have been reviewed and there are no changes at this time.   4. Coronary artery disease, non-occlusive Continue cardiac and antihypertensive medications as already ordered and reviewed, no changes at this time.  Continue statin as ordered and reviewed, no changes at this time  Nitrates PRN for chest pain   5. Type 2 diabetes mellitus with other circulatory complication, without long-term current use of insulin (HCC) Continue hypoglycemic medications as already ordered, these medications have been reviewed and there are no changes at this time.  Hgb A1C to be monitored as already arranged by primary service     Levora DredgeGregory Schnier, MD  01/07/2018 12:41 PM

## 2018-01-09 LAB — BILIRUBIN, FRACTIONATED(TOT/DIR/INDIR)
Bilirubin, Direct: 0.2 mg/dL (ref 0.0–0.2)
Indirect Bilirubin: 0.7 mg/dL (calc) (ref 0.2–1.2)
Total Bilirubin: 0.9 mg/dL (ref 0.2–1.2)

## 2018-03-16 ENCOUNTER — Emergency Department
Admission: EM | Admit: 2018-03-16 | Discharge: 2018-03-16 | Disposition: A | Discharge status: Home / Self Care | Payer: Medicare Other | Source: Home / Self Care | Attending: Emergency Medicine | Admitting: Emergency Medicine

## 2018-03-16 ENCOUNTER — Other Ambulatory Visit: Payer: Self-pay

## 2018-03-16 ENCOUNTER — Inpatient Hospital Stay
Admission: EM | Admit: 2018-03-16 | Discharge: 2018-03-20 | DRG: 602 | Disposition: A | Discharge status: Home Health | Payer: Medicare Other | Attending: Internal Medicine | Admitting: Internal Medicine

## 2018-03-16 DIAGNOSIS — Y998 Other external cause status: Secondary | ICD-10-CM

## 2018-03-16 DIAGNOSIS — K219 Gastro-esophageal reflux disease without esophagitis: Secondary | ICD-10-CM | POA: Diagnosis present

## 2018-03-16 DIAGNOSIS — I110 Hypertensive heart disease with heart failure: Secondary | ICD-10-CM | POA: Diagnosis present

## 2018-03-16 DIAGNOSIS — Z6836 Body mass index (BMI) 36.0-36.9, adult: Secondary | ICD-10-CM

## 2018-03-16 DIAGNOSIS — Z79899 Other long term (current) drug therapy: Secondary | ICD-10-CM

## 2018-03-16 DIAGNOSIS — Y92012 Bathroom of single-family (private) house as the place of occurrence of the external cause: Secondary | ICD-10-CM | POA: Insufficient documentation

## 2018-03-16 DIAGNOSIS — L03115 Cellulitis of right lower limb: Principal | ICD-10-CM | POA: Diagnosis present

## 2018-03-16 DIAGNOSIS — L0390 Cellulitis, unspecified: Secondary | ICD-10-CM | POA: Diagnosis present

## 2018-03-16 DIAGNOSIS — I890 Lymphedema, not elsewhere classified: Secondary | ICD-10-CM | POA: Diagnosis present

## 2018-03-16 DIAGNOSIS — I5032 Chronic diastolic (congestive) heart failure: Secondary | ICD-10-CM

## 2018-03-16 DIAGNOSIS — M1712 Unilateral primary osteoarthritis, left knee: Secondary | ICD-10-CM | POA: Diagnosis present

## 2018-03-16 DIAGNOSIS — I252 Old myocardial infarction: Secondary | ICD-10-CM | POA: Diagnosis not present

## 2018-03-16 DIAGNOSIS — Z7902 Long term (current) use of antithrombotics/antiplatelets: Secondary | ICD-10-CM

## 2018-03-16 DIAGNOSIS — Z87891 Personal history of nicotine dependence: Secondary | ICD-10-CM

## 2018-03-16 DIAGNOSIS — E669 Obesity, unspecified: Secondary | ICD-10-CM | POA: Diagnosis present

## 2018-03-16 DIAGNOSIS — I5033 Acute on chronic diastolic (congestive) heart failure: Secondary | ICD-10-CM | POA: Diagnosis not present

## 2018-03-16 DIAGNOSIS — I2510 Atherosclerotic heart disease of native coronary artery without angina pectoris: Secondary | ICD-10-CM | POA: Diagnosis present

## 2018-03-16 DIAGNOSIS — R609 Edema, unspecified: Secondary | ICD-10-CM

## 2018-03-16 DIAGNOSIS — W19XXXA Unspecified fall, initial encounter: Secondary | ICD-10-CM

## 2018-03-16 DIAGNOSIS — Z886 Allergy status to analgesic agent status: Secondary | ICD-10-CM

## 2018-03-16 DIAGNOSIS — J45909 Unspecified asthma, uncomplicated: Secondary | ICD-10-CM | POA: Diagnosis present

## 2018-03-16 DIAGNOSIS — Y9389 Activity, other specified: Secondary | ICD-10-CM | POA: Insufficient documentation

## 2018-03-16 DIAGNOSIS — I700 Atherosclerosis of aorta: Secondary | ICD-10-CM | POA: Diagnosis present

## 2018-03-16 DIAGNOSIS — I350 Nonrheumatic aortic (valve) stenosis: Secondary | ICD-10-CM | POA: Diagnosis present

## 2018-03-16 DIAGNOSIS — M25569 Pain in unspecified knee: Secondary | ICD-10-CM

## 2018-03-16 DIAGNOSIS — M6281 Muscle weakness (generalized): Secondary | ICD-10-CM | POA: Diagnosis not present

## 2018-03-16 DIAGNOSIS — R0602 Shortness of breath: Secondary | ICD-10-CM

## 2018-03-16 DIAGNOSIS — E785 Hyperlipidemia, unspecified: Secondary | ICD-10-CM | POA: Diagnosis present

## 2018-03-16 LAB — URINALYSIS, COMPLETE (UACMP) WITH MICROSCOPIC
BILIRUBIN URINE: NEGATIVE
Bacteria, UA: NONE SEEN
Glucose, UA: NEGATIVE mg/dL
Hgb urine dipstick: NEGATIVE
Ketones, ur: NEGATIVE mg/dL
Nitrite: NEGATIVE
PH: 6 (ref 5.0–8.0)
Protein, ur: NEGATIVE mg/dL
SPECIFIC GRAVITY, URINE: 1.01 (ref 1.005–1.030)

## 2018-03-16 LAB — BASIC METABOLIC PANEL
ANION GAP: 8 (ref 5–15)
BUN: 21 mg/dL (ref 8–23)
CALCIUM: 9.1 mg/dL (ref 8.9–10.3)
CO2: 28 mmol/L (ref 22–32)
CREATININE: 0.95 mg/dL (ref 0.61–1.24)
Chloride: 104 mmol/L (ref 98–111)
GFR calc Af Amer: >60 mL/min (ref >60)
GLUCOSE: 116 mg/dL — AB (ref 70–99)
Potassium: 3.3 mmol/L — ABNORMAL LOW (ref 3.5–5.1)
Sodium: 140 mmol/L (ref 135–145)

## 2018-03-16 LAB — GLUCOSE, CAPILLARY: Glucose-Capillary: 100 mg/dL — ABNORMAL HIGH (ref 70–99)

## 2018-03-16 LAB — CBC
HCT: 41.4 % (ref 39.0–52.0)
Hemoglobin: 13.3 g/dL (ref 13.0–17.0)
MCH: 27 pg (ref 26.0–34.0)
MCHC: 32.1 g/dL (ref 30.0–36.0)
MCV: 84 fL (ref 80.0–100.0)
PLATELETS: 173 10*3/uL (ref 150–400)
RBC: 4.93 MIL/uL (ref 4.22–5.81)
RDW: 13.8 % (ref 11.5–15.5)
WBC: 7.8 10*3/uL (ref 4.0–10.5)
nRBC: 0 % (ref 0.0–0.2)

## 2018-03-16 MED ORDER — FUROSEMIDE 10 MG/ML IJ SOLN
80.0000 mg | Freq: Once | INTRAMUSCULAR | Status: AC
  Administered 2018-03-16: 80 mg via INTRAVENOUS
  Filled 2018-03-16: qty 8

## 2018-03-16 MED ORDER — POTASSIUM CHLORIDE CRYS ER 20 MEQ PO TBCR
40.0000 meq | EXTENDED_RELEASE_TABLET | Freq: Once | ORAL | Status: AC
  Administered 2018-03-16: 40 meq via ORAL
  Filled 2018-03-16: qty 2

## 2018-03-16 NOTE — ED Notes (Signed)
Spoke to RN that took care of pt earlier today. States that he already spoke to MD that had pt earlier today about pt return. Denied need to redraw labs at this time.

## 2018-03-16 NOTE — ED Triage Notes (Signed)
Pt brought in via EMS from home. Experienced weakness and had a fall this morning. Complains of LLE that has not gotten better since starting on a "fluid pill". Denies hitting his head during the fall and denies pain anywhere.

## 2018-03-16 NOTE — ED Triage Notes (Signed)
Pt states was here today and DC today. States "my niece came to pick me up and they didn't give me no medicine and I don't know what the hell is going on." A&O, in wheelchair. No pain.   Pt states was seen earlier for a fall and fluid in legs.

## 2018-03-16 NOTE — ED Notes (Signed)
BG 100

## 2018-03-16 NOTE — ED Notes (Signed)
EKG given to EDP Kinner in person.

## 2018-03-16 NOTE — ED Provider Notes (Signed)
[facility] Emergency Department Provider Note  ____________________________________________   I have reviewed the triage vital signs and the nursing notes.   HISTORY  Chief Complaint Leg swelling  History limited by: Not Limited   HPI [name] is a 84 y.o. male who presents to the emergency department today after a fall.  The patient states he was in the bath when he fell.  It sounds like his try to get up off the toilet.  He denies losing consciousness or hitting his head.  He states that his stepson was the one who asked that he be evaluated in the hospital.  Patient denies any pain after his fall.  The patient's main complaint is for leg swelling.  This does appear to be a chronic problem.  He has seen vascular surgery for lymphedema and states he is still using insulin pump.  Additionally he states he has been taking his fluid medications.  He denies any pain in his legs.      Per medical record review patient has a history of lymphedema  Past Medical History:  Diagnosis Date  . Aortic atherosclerosis (HCC) 05/2013   Per TEE  . Arrhythmia   . Asthma   . CHF (congestive heart failure) (HCC)   . Colon polyp   . Coronary artery disease    a. Nonobstructive by 2004 cath b. Nonobstructive by 2015 cath in setting of NSTEMI  . GERD (gastroesophageal reflux disease)   . Heart murmur   . Hiatal hernia   . Hyperlipidemia   . Hypertension   . Lymphedema   . Moderate aortic stenosis    a. echo 2013: EF 55%, nl wall motion, mild DD, mild LVH, trace MR, moderate AS with peak gradient of 55 mm Hg; b. TEE 05/2013: EF EF 60-65%, borderline LVH, mild MR/AI, mod AS with valve area 1.0-1.26; c. echo 10/2014: 55-60%, no RWMA, GR1DD, mod AS w/ mean gradient of 28 mm Hg     . Obesity   . Onychomycosis   . Osteoarthritis     Patient Active Problem List   Diagnosis Date Noted  . Acute on chronic heart failure (HCC) 10/19/2017  . E. coli UTI 09/29/2017  . Poor social  situation 09/06/2017  . Chronic diastolic heart failure (HCC)   . Venous ulcer (HCC) 07/08/2017  . Osteoarthritis 05/28/2017  . Varicose veins of both lower extremities with pain 04/10/2017  . Iron deficiency anemia 11/25/2016  . Chronic venous insufficiency 10/04/2016  . Lymphedema 06/08/2016  . Morbid obesity (HCC) 03/26/2015  . Coronary artery disease, non-occlusive 06/05/2013  . Hypertension 06/05/2013  . Hyperlipidemia 02/23/2012  . Diabetes mellitus (HCC) 02/23/2012  . Moderate aortic stenosis 02/23/2012    Past Surgical History:  Procedure Laterality Date  . CARDIAC CATHETERIZATION  11/2002  . CARDIAC CATHETERIZATION  05/21/2013   showing 30% oLM, 30% D1, 20% mLCx, 30% pRCA; EF 60%, severe AS (mean gradient 28 mmHg, peak 26, area 0.9)  . CHOLECYSTECTOMY  2001  . TRANSESOPHAGEAL ECHOCARDIOGRAM  05/22/2013   Preserved EF, moderate AS (valve area by planimetry between 1.0-1.26 cm sq), moderate aortic arch and descending aortic atherosclerosis.   . TRANSTHORACIC ECHOCARDIOGRAM  05/19/2013   EF 60-65%, impaired diastolic function, mild LA dilatation, mild MR/AI/TR, mod AS, high normal RVSP (30.4 mmHg)    Prior to Admission medications   Medication Sig Start Date End Date Taking? Authorizing Provider  acetaminophen (TYLENOL) 325 MG tablet Take 2 tablets (650 mg total) by mouth every 6 (  six) hours as needed for mild pain (or Fever >/= 101). 09/29/17   Rodolph Bong, MD  atorvastatin (LIPITOR) 80 MG tablet Take 1 tablet (80 mg total) by mouth daily. 10/19/17   Delma Freeze, FNP  clopidogrel (PLAVIX) 75 MG tablet Take 1 tablet (75 mg total) by mouth daily. 10/19/17   Delma Freeze, FNP  docusate sodium (COLACE) 100 MG capsule Take 2 capsules (200 mg total) by mouth 2 (two) times daily. 08/10/17   Auburn Bilberry, MD  ferrous sulfate 325 (65 FE) MG EC tablet Take 1 tablet (325 mg total) by mouth 2 (two) times daily. 06/06/17   Glori Luis, MD  furosemide (LASIX) 40 MG tablet Take 1  tablet (40 mg total) by mouth 2 (two) times daily. 10/19/17   Delma Freeze, FNP  hydrocerin (EUCERIN) CREA Apply 1 application topically 2 (two) times daily. Apply to bilateral LEs and feet twice daily after cleansing with house skin cleanser and gently patting dry. No not apply between toes. 08/10/17   Auburn Bilberry, MD  losartan (COZAAR) 25 MG tablet Take 1 tablet (25 mg total) by mouth daily. 12/15/17 03/15/18  Antonieta Iba, MD  metoprolol succinate (TOPROL-XL) 25 MG 24 hr tablet Take 1 tablet (25 mg total) by mouth daily. 10/19/17   Delma Freeze, FNP  polyethylene glycol (MIRALAX / GLYCOLAX) packet Take 17 g by mouth daily. 08/10/17   Auburn Bilberry, MD  potassium chloride (K-DUR) 10 MEQ tablet Take 1 tablet (10 mEq total) by mouth 2 (two) times daily. 12/12/17   Delma Freeze, FNP    Allergies Tylenol [acetaminophen] and Aspirin  Family History  Problem Relation Age of Onset  . Heart disease Mother   . Heart disease Father 20  . Stroke Brother     Social History Social History   Tobacco Use  . Smoking status: Former Smoker    Packs/[place]: 1.50    Years: 20.00    Pack years: 30.00    Types: Cigarettes    Last attempt to quit: 02/22/1981    Years since quitting: 37.0  . Smokeless tobacco: Never Used  Substance Use Topics  . Alcohol use: No  . Drug use: No    Review of Systems Constitutional: No fever/chills Eyes: No visual changes. ENT: No sore throat. Cardiovascular: Denies chest pain. Respiratory: Denies shortness of breath. Gastrointestinal: No abdominal pain.  No nausea, no vomiting.  No diarrhea.   Genitourinary: Negative for dysuria. Musculoskeletal: Positive for leg swelling Skin: Negative for rash. Neurological: Negative for headaches, focal weakness or numbness.  ____________________________________________   PHYSICAL EXAM:  VITAL SIGNS: ED Triage Vitals  Enc Vitals Group     BP 03/16/18 1422 (!) 127/59     Pulse Rate 03/16/18 1422 96     Resp  03/16/18 1422 19     Temp 03/16/18 1422 98.2 F (36.8 C)     Temp Source 03/16/18 1422 Oral     SpO2 03/16/18 1416 96 %     Weight 03/16/18 1424 273 lb (123.8 kg)     Height 03/16/18 1424 5\' 11"  (1.803 m)    Constitutional: Alert and oriented.  Eyes: Conjunctivae are normal.  ENT      Head: Normocephalic and atraumatic.      Nose: No congestion/rhinnorhea.      Mouth/Throat: Mucous membranes are moist.      Neck: No stridor. Hematological/Lymphatic/Immunilogical: No cervical lymphadenopathy. Cardiovascular: Normal rate, regular rhythm.  No murmurs, rubs, or gallops.  Respiratory: Normal respiratory effort without tachypnea nor retractions. Breath sounds are clear and equal bilaterally. No wheezes/rales/rhonchi. Gastrointestinal: Soft and non tender. No rebound. No guarding.  Genitourinary: Deferred Musculoskeletal: 3+ edema in bilateral lower extremities. Neurologic:  Normal speech and language. No gross focal neurologic deficits are appreciated.  Skin:  Skin is warm, dry and intact. No rash noted. Psychiatric: Mood and affect are normal. Speech and behavior are normal. Patient exhibits appropriate insight and judgment.  ____________________________________________    LABS (pertinent positives/negatives)  Glu 100 CBC wnl BMP na 140, k 3.3, glu 116, cr 0.95 UA clear, small leukocytes, 0-5 wbc and rbc ____________________________________________   EKG  I, Phineas Semen, attending physician, personally viewed and interpreted this EKG  EKG Time: 1420 Rate: 100 Rhythm: sinus tachycardia Axis: normal Intervals: qtc 510 QRS: RBBB ST changes: no st elevation Impression: abnormal ekg  ____________________________________________    RADIOLOGY  None  ____________________________________________   PROCEDURES  Procedures  ____________________________________________   INITIAL IMPRESSION / ASSESSMENT AND PLAN / ED COURSE  Pertinent labs & imaging results that  were available during my care of the patient were reviewed by me and considered in my medical decision making (see chart for details).   Patient presents to the emergency department today after a fall.  Patient denies any pain.  Patient is on Plavix however he denies any head injury.  He denies any pain at this time.  Patient's main complaint is for chronic lower extremity swelling.  At this point no evidence of overlying infection.  Discussed with patient that he should continue to follow-up with vascular surgery. _______________________________________   FINAL CLINICAL IMPRESSION(S) / ED DIAGNOSES  Final diagnoses:  Fall, initial encounter  Lymphedema     Note: This dictation was prepared with Dragon dictation. Any transcriptional errors that result from this process are unintentional     Phineas Semen, MD 03/16/18 1551

## 2018-03-16 NOTE — ED Provider Notes (Signed)
[facility] Emergency Department Provider Note  ____________________________________________   I have reviewed the triage vital signs and the nursing notes.   HISTORY  Chief Complaint Difficulty walking  History limited by: Not Limited   HPI [name] is a 84 y.o. male who presents to the emergency department today because of concerns for inability to ambulate.  I evaluated the patient earlier this afternoon.  He did not mention that his ambulation had changed.  However after discharge his daughter was now able to bring him back to the emergency department and discussed that that was the family's primary concern was for change in ambulatory status.  Per medical record review patient has a history of lymphedema.  Past Medical History:  Diagnosis Date  . Aortic atherosclerosis (HCC) 05/2013   Per TEE  . Arrhythmia   . Asthma   . CHF (congestive heart failure) (HCC)   . Colon polyp   . Coronary artery disease    a. Nonobstructive by 2004 cath b. Nonobstructive by 2015 cath in setting of NSTEMI  . GERD (gastroesophageal reflux disease)   . Heart murmur   . Hiatal hernia   . Hyperlipidemia   . Hypertension   . Lymphedema   . Moderate aortic stenosis    a. echo 2013: EF 55%, nl wall motion, mild DD, mild LVH, trace MR, moderate AS with peak gradient of 55 mm Hg; b. TEE 05/2013: EF EF 60-65%, borderline LVH, mild MR/AI, mod AS with valve area 1.0-1.26; c. echo 10/2014: 55-60%, no RWMA, GR1DD, mod AS w/ mean gradient of 28 mm Hg     . Obesity   . Onychomycosis   . Osteoarthritis     Patient Active Problem List   Diagnosis Date Noted  . Acute on chronic heart failure (HCC) 10/19/2017  . E. coli UTI 09/29/2017  . Poor social situation 09/06/2017  . Chronic diastolic heart failure (HCC)   . Venous ulcer (HCC) 07/08/2017  . Osteoarthritis 05/28/2017  . Varicose veins of both lower extremities with pain 04/10/2017  . Iron deficiency anemia 11/25/2016  .  Chronic venous insufficiency 10/04/2016  . Lymphedema 06/08/2016  . Morbid obesity (HCC) 03/26/2015  . Coronary artery disease, non-occlusive 06/05/2013  . Hypertension 06/05/2013  . Hyperlipidemia 02/23/2012  . Diabetes mellitus (HCC) 02/23/2012  . Moderate aortic stenosis 02/23/2012    Past Surgical History:  Procedure Laterality Date  . CARDIAC CATHETERIZATION  11/2002  . CARDIAC CATHETERIZATION  05/21/2013   showing 30% oLM, 30% D1, 20% mLCx, 30% pRCA; EF 60%, severe AS (mean gradient 28 mmHg, peak 26, area 0.9)  . CHOLECYSTECTOMY  2001  . TRANSESOPHAGEAL ECHOCARDIOGRAM  05/22/2013   Preserved EF, moderate AS (valve area by planimetry between 1.0-1.26 cm sq), moderate aortic arch and descending aortic atherosclerosis.   . TRANSTHORACIC ECHOCARDIOGRAM  05/19/2013   EF 60-65%, impaired diastolic function, mild LA dilatation, mild MR/AI/TR, mod AS, high normal RVSP (30.4 mmHg)    Prior to Admission medications   Medication Sig Start Date End Date Taking? Authorizing Provider  acetaminophen (TYLENOL) 325 MG tablet Take 2 tablets (650 mg total) by mouth every 6 (six) hours as needed for mild pain (or Fever >/= 101). 09/29/17   Rodolph Bong, MD  atorvastatin (LIPITOR) 80 MG tablet Take 1 tablet (80 mg total) by mouth daily. 10/19/17   Delma Freeze, FNP  clopidogrel (PLAVIX) 75 MG tablet Take 1 tablet (75 mg total) by mouth daily. 10/19/17   Delma Freeze, FNP  docusate sodium (COLACE) 100 MG capsule Take 2 capsules (200 mg total) by mouth 2 (two) times daily. 08/10/17   Auburn Bilberry, MD  ferrous sulfate 325 (65 FE) MG EC tablet Take 1 tablet (325 mg total) by mouth 2 (two) times daily. 06/06/17   Glori Luis, MD  furosemide (LASIX) 40 MG tablet Take 1 tablet (40 mg total) by mouth 2 (two) times daily. 10/19/17   Delma Freeze, FNP  hydrocerin (EUCERIN) CREA Apply 1 application topically 2 (two) times daily. Apply to bilateral LEs and feet twice daily after cleansing with house skin  cleanser and gently patting dry. No not apply between toes. 08/10/17   Auburn Bilberry, MD  losartan (COZAAR) 25 MG tablet Take 1 tablet (25 mg total) by mouth daily. 12/15/17 03/15/18  Antonieta Iba, MD  metoprolol succinate (TOPROL-XL) 25 MG 24 hr tablet Take 1 tablet (25 mg total) by mouth daily. 10/19/17   Delma Freeze, FNP  polyethylene glycol (MIRALAX / GLYCOLAX) packet Take 17 g by mouth daily. 08/10/17   Auburn Bilberry, MD  potassium chloride (K-DUR) 10 MEQ tablet Take 1 tablet (10 mEq total) by mouth 2 (two) times daily. 12/12/17   Delma Freeze, FNP    Allergies Tylenol [acetaminophen] and Aspirin  Family History  Problem Relation Age of Onset  . Heart disease Mother   . Heart disease Father 36  . Stroke Brother     Social History Social History   Tobacco Use  . Smoking status: Former Smoker    Packs/Culbreth: 1.50    Years: 20.00    Pack years: 30.00    Types: Cigarettes    Last attempt to quit: 02/22/1981    Years since quitting: 37.0  . Smokeless tobacco: Never Used  Substance Use Topics  . Alcohol use: No  . Drug use: No    Review of Systems Constitutional: No fever/chills Eyes: No visual changes. ENT: No sore throat. Cardiovascular: Denies chest pain. Respiratory: Denies shortness of breath. Gastrointestinal: No abdominal pain.  No nausea, no vomiting.  No diarrhea.   Genitourinary: Negative for dysuria. Musculoskeletal: Positive for leg swelling Skin: Negative for rash. Neurological: Negative for headaches, focal weakness or numbness.  ____________________________________________   PHYSICAL EXAM:  VITAL SIGNS: ED Triage Vitals  Enc Vitals Group     BP 03/16/18 1737 (!) 167/67     Pulse Rate 03/16/18 1737 95     Resp --      Temp 03/16/18 1737 98.7 F (37.1 C)     Temp Source 03/16/18 1737 Oral     SpO2 03/16/18 1737 98 %     Weight 03/16/18 1743 271 lb 2.7 oz (123 kg)     Height 03/16/18 1743 5\' 11"  (1.803 m)     Head Circumference --       Peak Flow --      Pain Score 03/16/18 1743 0    Constitutional: Alert and oriented.  Eyes: Conjunctivae are normal.  ENT      Head: Normocephalic and atraumatic.      Nose: No congestion/rhinnorhea.      Mouth/Throat: Mucous membranes are moist.      Neck: No stridor. Hematological/Lymphatic/Immunilogical: No cervical lymphadenopathy. Cardiovascular: Normal rate, regular rhythm.  No murmurs, rubs, or gallops.  Respiratory: Normal respiratory effort without tachypnea nor retractions. Breath sounds are clear and equal bilaterally. No wheezes/rales/rhonchi. Gastrointestinal: Soft and non tender. No rebound. No guarding.  Genitourinary: Deferred Musculoskeletal: Normal range of motion in all  extremities. Positive for bilateral lower extremity edema Neurologic:  Normal speech and language. No gross focal neurologic deficits are appreciated.  Skin:  Skin is warm, dry and intact. No rash noted. Psychiatric: Mood and affect are normal. Speech and behavior are normal. Patient exhibits appropriate insight and judgment.  ____________________________________________    LABS (pertinent positives/negatives)  None  ____________________________________________   EKG  None  ____________________________________________    RADIOLOGY  None  ____________________________________________   PROCEDURES  Procedures  ____________________________________________   INITIAL IMPRESSION / ASSESSMENT AND PLAN / ED COURSE  Pertinent labs & imaging results that were available during my care of the patient were reviewed by me and considered in my medical decision making (see chart for details).   Patient here because he is having difficulty with ambulation.  This apparently is any significant change from the patient's baseline.  Patient does think it is due to the swelling in his legs.  He does have the swelling chronically but apparently family states it is worse today.  They state that  sometimes getting IV Lasix to get his of the fluid off can help with this.  Will plan on giving IV fluid.  Will see if that helps.  If not we will get social work and physical therapy involved in the morning.  ____________________________________________   FINAL CLINICAL IMPRESSION(S) / ED DIAGNOSES  Peripheral Edema Ambulation Difficulty  Note: This dictation was prepared with Dragon dictation. Any transcriptional errors that result from this process are unintentional     Phineas Semen, MD 03/16/18 2334

## 2018-03-16 NOTE — Discharge Instructions (Addendum)
Please seek medical attention for any high fevers, chest pain, shortness of breath, change in behavior, persistent vomiting, bloody stool or any other new or concerning symptoms.  

## 2018-03-16 NOTE — ED Notes (Signed)
EKG completed

## 2018-03-17 ENCOUNTER — Encounter: Payer: Self-pay | Admitting: Internal Medicine

## 2018-03-17 ENCOUNTER — Inpatient Hospital Stay: Payer: Medicare Other

## 2018-03-17 DIAGNOSIS — R601 Generalized edema: Secondary | ICD-10-CM | POA: Diagnosis not present

## 2018-03-17 DIAGNOSIS — L0390 Cellulitis, unspecified: Secondary | ICD-10-CM | POA: Diagnosis present

## 2018-03-17 DIAGNOSIS — E669 Obesity, unspecified: Secondary | ICD-10-CM | POA: Diagnosis present

## 2018-03-17 DIAGNOSIS — L03115 Cellulitis of right lower limb: Secondary | ICD-10-CM | POA: Diagnosis present

## 2018-03-17 DIAGNOSIS — J45909 Unspecified asthma, uncomplicated: Secondary | ICD-10-CM | POA: Diagnosis present

## 2018-03-17 DIAGNOSIS — M25562 Pain in left knee: Secondary | ICD-10-CM | POA: Diagnosis not present

## 2018-03-17 DIAGNOSIS — Z886 Allergy status to analgesic agent status: Secondary | ICD-10-CM | POA: Diagnosis not present

## 2018-03-17 DIAGNOSIS — I2510 Atherosclerotic heart disease of native coronary artery without angina pectoris: Secondary | ICD-10-CM | POA: Diagnosis present

## 2018-03-17 DIAGNOSIS — J986 Disorders of diaphragm: Secondary | ICD-10-CM | POA: Diagnosis not present

## 2018-03-17 DIAGNOSIS — Z87891 Personal history of nicotine dependence: Secondary | ICD-10-CM | POA: Diagnosis not present

## 2018-03-17 DIAGNOSIS — L03116 Cellulitis of left lower limb: Secondary | ICD-10-CM | POA: Diagnosis not present

## 2018-03-17 DIAGNOSIS — Z79899 Other long term (current) drug therapy: Secondary | ICD-10-CM | POA: Diagnosis not present

## 2018-03-17 DIAGNOSIS — M1712 Unilateral primary osteoarthritis, left knee: Secondary | ICD-10-CM | POA: Diagnosis present

## 2018-03-17 DIAGNOSIS — K219 Gastro-esophageal reflux disease without esophagitis: Secondary | ICD-10-CM | POA: Diagnosis present

## 2018-03-17 DIAGNOSIS — Z6836 Body mass index (BMI) 36.0-36.9, adult: Secondary | ICD-10-CM | POA: Diagnosis not present

## 2018-03-17 DIAGNOSIS — I252 Old myocardial infarction: Secondary | ICD-10-CM | POA: Diagnosis not present

## 2018-03-17 DIAGNOSIS — I350 Nonrheumatic aortic (valve) stenosis: Secondary | ICD-10-CM | POA: Diagnosis present

## 2018-03-17 DIAGNOSIS — I5033 Acute on chronic diastolic (congestive) heart failure: Secondary | ICD-10-CM | POA: Diagnosis present

## 2018-03-17 DIAGNOSIS — R600 Localized edema: Secondary | ICD-10-CM | POA: Diagnosis not present

## 2018-03-17 DIAGNOSIS — I700 Atherosclerosis of aorta: Secondary | ICD-10-CM | POA: Diagnosis present

## 2018-03-17 DIAGNOSIS — I890 Lymphedema, not elsewhere classified: Secondary | ICD-10-CM | POA: Diagnosis present

## 2018-03-17 DIAGNOSIS — Z7902 Long term (current) use of antithrombotics/antiplatelets: Secondary | ICD-10-CM | POA: Diagnosis not present

## 2018-03-17 DIAGNOSIS — I5030 Unspecified diastolic (congestive) heart failure: Secondary | ICD-10-CM | POA: Diagnosis not present

## 2018-03-17 DIAGNOSIS — E785 Hyperlipidemia, unspecified: Secondary | ICD-10-CM | POA: Diagnosis present

## 2018-03-17 DIAGNOSIS — I110 Hypertensive heart disease with heart failure: Secondary | ICD-10-CM | POA: Diagnosis present

## 2018-03-17 LAB — URIC ACID: URIC ACID, SERUM: 5.2 mg/dL (ref 3.7–8.6)

## 2018-03-17 LAB — MRSA PCR SCREENING: MRSA BY PCR: NEGATIVE

## 2018-03-17 MED ORDER — ONDANSETRON HCL 4 MG/2ML IJ SOLN: 4.0000 mg | Freq: Four times a day (QID) | INTRAMUSCULAR | Status: DC | PRN

## 2018-03-17 MED ORDER — ATORVASTATIN CALCIUM 20 MG PO TABS
80.0000 mg | ORAL_TABLET | Freq: Every day | ORAL | Status: DC
  Administered 2018-03-18 – 2018-03-19 (×2): 80 mg via ORAL
  Filled 2018-03-17 (×2): qty 4

## 2018-03-17 MED ORDER — FUROSEMIDE 10 MG/ML IJ SOLN: 80.0000 mg | Freq: Two times a day (BID) | INTRAMUSCULAR | Status: DC

## 2018-03-17 MED ORDER — SODIUM CHLORIDE 0.9% FLUSH
3.0000 mL | Freq: Two times a day (BID) | INTRAVENOUS | Status: DC
  Administered 2018-03-17 – 2018-03-20 (×5): 3 mL via INTRAVENOUS

## 2018-03-17 MED ORDER — SODIUM CHLORIDE 0.9% FLUSH
3.0000 mL | Freq: Two times a day (BID) | INTRAVENOUS | Status: DC
  Administered 2018-03-17 – 2018-03-20 (×5): 3 mL via INTRAVENOUS

## 2018-03-17 MED ORDER — METOPROLOL SUCCINATE ER 25 MG PO TB24
25.0000 mg | ORAL_TABLET | Freq: Every day | ORAL | Status: DC
  Administered 2018-03-18 – 2018-03-20 (×3): 25 mg via ORAL
  Filled 2018-03-17 (×3): qty 1

## 2018-03-17 MED ORDER — ENOXAPARIN SODIUM 40 MG/0.4ML ~~LOC~~ SOLN
40.0000 mg | SUBCUTANEOUS | Status: DC
  Administered 2018-03-17 – 2018-03-19 (×3): 40 mg via SUBCUTANEOUS
  Filled 2018-03-17 (×3): qty 0.4

## 2018-03-17 MED ORDER — FUROSEMIDE 10 MG/ML IJ SOLN
40.0000 mg | Freq: Two times a day (BID) | INTRAMUSCULAR | Status: DC
  Administered 2018-03-17 – 2018-03-20 (×6): 40 mg via INTRAVENOUS
  Filled 2018-03-17 (×6): qty 4

## 2018-03-17 MED ORDER — HYDROCERIN EX CREA
1.0000 "application " | TOPICAL_CREAM | Freq: Two times a day (BID) | CUTANEOUS | Status: DC
  Administered 2018-03-17 – 2018-03-20 (×6): 1 via TOPICAL
  Filled 2018-03-17 (×2): qty 113

## 2018-03-17 MED ORDER — FERROUS SULFATE 325 (65 FE) MG PO TABS
325.0000 mg | ORAL_TABLET | Freq: Two times a day (BID) | ORAL | Status: DC
  Administered 2018-03-17 – 2018-03-20 (×7): 325 mg via ORAL
  Filled 2018-03-17 (×9): qty 1

## 2018-03-17 MED ORDER — CEFAZOLIN SODIUM-DEXTROSE 1-4 GM/50ML-% IV SOLN
1.0000 g | Freq: Four times a day (QID) | INTRAVENOUS | Status: DC
  Administered 2018-03-17 – 2018-03-19 (×8): 1 g via INTRAVENOUS
  Filled 2018-03-17 (×12): qty 50

## 2018-03-17 MED ORDER — POTASSIUM CHLORIDE CRYS ER 10 MEQ PO TBCR
10.0000 meq | EXTENDED_RELEASE_TABLET | Freq: Two times a day (BID) | ORAL | Status: DC
  Administered 2018-03-17 – 2018-03-20 (×6): 10 meq via ORAL
  Filled 2018-03-17 (×7): qty 1

## 2018-03-17 MED ORDER — ONDANSETRON HCL 4 MG PO TABS: 4.0000 mg | ORAL_TABLET | Freq: Four times a day (QID) | ORAL | Status: DC | PRN

## 2018-03-17 MED ORDER — DOCUSATE SODIUM 100 MG PO CAPS
200.0000 mg | ORAL_CAPSULE | Freq: Two times a day (BID) | ORAL | Status: DC
  Administered 2018-03-17 – 2018-03-20 (×6): 200 mg via ORAL
  Filled 2018-03-17 (×6): qty 2

## 2018-03-17 MED ORDER — LOSARTAN POTASSIUM 50 MG PO TABS: 25.0000 mg | ORAL_TABLET | Freq: Every day | ORAL | Status: DC

## 2018-03-17 MED ORDER — CLOPIDOGREL BISULFATE 75 MG PO TABS
75.0000 mg | ORAL_TABLET | Freq: Every day | ORAL | Status: DC
  Administered 2018-03-17 – 2018-03-20 (×4): 75 mg via ORAL
  Filled 2018-03-17 (×4): qty 1

## 2018-03-17 MED ORDER — POLYETHYLENE GLYCOL 3350 17 G PO PACK
17.0000 g | PACK | Freq: Every day | ORAL | Status: DC
  Administered 2018-03-18 – 2018-03-20 (×3): 17 g via ORAL
  Filled 2018-03-17 (×3): qty 1

## 2018-03-17 MED ORDER — INFLUENZA VAC SPLIT HIGH-DOSE 0.5 ML IM SUSY
0.5000 mL | PREFILLED_SYRINGE | INTRAMUSCULAR | Status: DC
  Filled 2018-03-17: qty 0.5

## 2018-03-17 NOTE — ED Notes (Signed)
Pt's daughter states she is going home and would like to be called when pt is assigned a room.

## 2018-03-17 NOTE — Progress Notes (Signed)
[facility]  Address: [address] [place] [address]  Phone: [phone] [name])- daughter    If patient discharges after hours, please call [phone].   Dimple Casey 03/17/2018, 12:32 PM

## 2018-03-17 NOTE — Clinical Social Work Placement (Signed)
   CLINICAL SOCIAL WORK PLACEMENT  NOTE  Date:  03/17/2018  Patient Details  Name: [name] MRN: [id number] Date of Birth: 22-Jul-1930  Clinical Social Work is seeking post-discharge placement for this patient at the Skilled  Nursing Facility level of care (*CSW will initial, date and re-position this form in  chart as items are completed):  Yes   Patient/family provided with [facility] Clinical Social Work Department's list of facilities offering this level of care within the geographic area requested by the patient (or if unable, by the patient's family).  Yes   Patient/family informed of their freedom to choose among providers that offer the needed level of care, that participate in Medicare, Medicaid or managed care program needed by the patient, have an available bed and are willing to accept the patient.  Yes   Patient/family informed of [facility]'s ownership interest in [facility] and [facility], as well as of the fact that they are under no obligation to receive care at these facilities.  PASRR submitted to EDS on 03/17/18     PASRR number received on       Existing PASRR number confirmed on 03/17/18     FL2 transmitted to all facilities in geographic area requested by pt/family on 03/17/18     FL2 transmitted to all facilities within larger geographic area on       Patient informed that his/her managed care company has contracts with or will negotiate with certain facilities, including the following:            Patient/family informed of bed offers received.  Patient chooses bed at       Physician recommends and patient chooses bed at      Patient to be transferred to   on  .  Patient to be transferred to facility by       Patient family notified on   of transfer.  Name of family member notified:        PHYSICIAN       Additional Comment:    _______________________________________________ Ruthe Mannan, LCSWA 03/17/2018, 5:23 PM

## 2018-03-17 NOTE — ED Notes (Signed)
Patient is resting comfortably. 

## 2018-03-17 NOTE — ED Notes (Signed)
Family at bedside. 

## 2018-03-17 NOTE — ED Notes (Signed)
Sandwich tray given 

## 2018-03-17 NOTE — ED Notes (Signed)
Pt is placed up for discharge. Contacted daughter reference transporting pt home. She advised if pt can not stand, she will be unable to get him in the house. Attempted to get pt out of bed with assistance x 2 and use of a walker. Pt is not able to stand and bear weight on legs. MD made aware and advised to contact social work and provide update.

## 2018-03-17 NOTE — ED Notes (Signed)
Pt to the er for unable to walk due to chronic lower extremity edema. Pt is followed by a vascular surgeon and is to take lasix at home and use compression boots to help with edema. Daughter states that pt is also to keep his legs elevated. Daughter states that pt is noncompliant with things at home. Pt normally walks with a cane but now is unable to walk due to his edema mostly in his right leg. Pt has chronic edema.

## 2018-03-17 NOTE — ED Notes (Signed)
Pt sleeping quietly. No s/sx of distress noted. No further urine output at this time.

## 2018-03-17 NOTE — Clinical Social Work Note (Signed)
CSW consulted for SNF or Assisted Living placement. Patient does not meet criteria for Medicare to pay for either facility. CSW met with patient and explained that since he has not had a 3 night hospital stay he would not qualify for skilled nursing. CSW explained that he could go to SNF or ALF if he is willing to pay out of pocket. Patient states that he is not able to pay out of pocket and wants to go home. CSW contacted patient's daughter Lyndel Safe [phone]. CSW explained above information to daughter as well. Daughter states that she will be here soon to pick up patient and take him home. CSW also explained to daughter that home health can be set up and she agreed to home health. CSW notified RNCM of above. CSW signing off. Please re consult if further needs arise.   [place], [place]

## 2018-03-17 NOTE — ED Notes (Signed)
Pt sleeping soundly. No distress. VSS

## 2018-03-17 NOTE — ED Provider Notes (Addendum)
Spoke with the patient and offered to keep him in the hospital overnight to speak to physical therapy and social work regarding assisted living or possible rehabilitation however he declined stating he wanted to go home.   Merrily Brittle, MD 03/17/18 0129   Family now at bedside to help try to take the patient home.  The patient is unable to bear weight on his own and is nearly falling.  I do not believe he is safe for discharge tonight and the family cannot care for him.  Physical therapy and social work consultations have been ordered.   Merrily Brittle, MD 03/17/18 [phone]

## 2018-03-17 NOTE — Discharge Instructions (Signed)
Today we offered to keep you in the hospital to speak to a physical therapist and a Child psychotherapist and to help try to find you rehabilitation or assisted living however he declined stating he would prefer to go home.  Please follow-up with your primary care physician this coming Monday for recheck and return to the emergency department sooner for any concerns whatsoever.  It was a pleasure to take care of you today, and thank you for coming to our emergency department.  If you have any questions or concerns before leaving please ask the nurse to grab me and I'm more than happy to go through your aftercare instructions again.  If you have any concerns once you are home that you are not improving or are in fact getting worse before you can make it to your follow-up appointment, please do not hesitate to call 911 and come back for further evaluation.  Results for orders placed or performed during the hospital encounter of 03/16/18  Basic metabolic panel  Result Value Ref Range   Sodium 140 135 - 145 mmol/L   Potassium 3.3 (L) 3.5 - 5.1 mmol/L   Chloride 104 98 - 111 mmol/L   CO2 28 22 - 32 mmol/L   Glucose, Bld 116 (H) 70 - 99 mg/dL   BUN 21 8 - 23 mg/dL   Creatinine, Ser 1.61 0.61 - 1.24 mg/dL   Calcium 9.1 8.9 - 09.6 mg/dL   GFR calc non Af Amer >60 >60 mL/min   GFR calc Af Amer >60 >60 mL/min   Anion gap 8 5 - 15  CBC  Result Value Ref Range   WBC 7.8 4.0 - 10.5 K/uL   RBC 4.93 4.22 - 5.81 MIL/uL   Hemoglobin 13.3 13.0 - 17.0 g/dL   HCT 04.5 40.9 - 81.1 %   MCV 84.0 80.0 - 100.0 fL   MCH 27.0 26.0 - 34.0 pg   MCHC 32.1 30.0 - 36.0 g/dL   RDW 91.4 78.2 - 95.6 %   Platelets 173 150 - 400 K/uL   nRBC 0.0 0.0 - 0.2 %  Urinalysis, Complete w Microscopic  Result Value Ref Range   Color, Urine YELLOW (A) YELLOW   APPearance CLEAR (A) CLEAR   Specific Gravity, Urine 1.010 1.005 - 1.030   pH 6.0 5.0 - 8.0   Glucose, UA NEGATIVE NEGATIVE mg/dL   Hgb urine dipstick NEGATIVE NEGATIVE   Bilirubin Urine NEGATIVE NEGATIVE   Ketones, ur NEGATIVE NEGATIVE mg/dL   Protein, ur NEGATIVE NEGATIVE mg/dL   Nitrite NEGATIVE NEGATIVE   Leukocytes, UA SMALL (A) NEGATIVE   RBC / HPF 0-5 0 - 5 RBC/hpf   WBC, UA 0-5 0 - 5 WBC/hpf   Bacteria, UA NONE SEEN NONE SEEN   Squamous Epithelial / LPF 0-5 0 - 5   Mucus PRESENT    Hyaline Casts, UA PRESENT   Glucose, capillary  Result Value Ref Range   Glucose-Capillary 100 (H) 70 - 99 mg/dL

## 2018-03-17 NOTE — NC FL2 (Signed)
[place] MEDICAID FL2 LEVEL OF CARE SCREENING TOOL     IDENTIFICATION  Patient Name: [name] Birthdate: Mar 16, 1931 Sex: male Admission Date (Current Location): 03/16/2018  [place] and [place] Number:  Chiropodist and Address:  [facility], [address] [place], [place] [address]      Provider Number: [id number]  Attending Physician Name and Address:  Alford Highland, MD  Relative Name and Phone Number:  [name]- daughter [phone]    Current Level of Care: Hospital Recommended Level of Care: Skilled Nursing Facility Prior Approval Number:    Date Approved/Denied:   PASRR Number: [id number]  Discharge Plan: SNF    Current Diagnoses: Patient Active Problem List   Diagnosis Date Noted  . Cellulitis 03/17/2018  . Acute on chronic heart failure (HCC) 10/19/2017  . E. coli UTI 09/29/2017  . Poor social situation 09/06/2017  . Chronic diastolic heart failure (HCC)   . Venous ulcer (HCC) 07/08/2017  . Osteoarthritis 05/28/2017  . Varicose veins of both lower extremities with pain 04/10/2017  . Iron deficiency anemia 11/25/2016  . Chronic venous insufficiency 10/04/2016  . Lymphedema 06/08/2016  . Morbid obesity (HCC) 03/26/2015  . Coronary artery disease, non-occlusive 06/05/2013  . Hypertension 06/05/2013  . Hyperlipidemia 02/23/2012  . Diabetes mellitus (HCC) 02/23/2012  . Moderate aortic stenosis 02/23/2012    Orientation RESPIRATION BLADDER Height & Weight     Self, Time, Place, Situation  Normal Continent Weight: 264 lb 11.2 oz (120.1 kg) Height:  5\' 11"  (180.3 cm)  BEHAVIORAL SYMPTOMS/MOOD NEUROLOGICAL BOWEL NUTRITION STATUS  (none) (none) Continent Diet(Heart healthy )  AMBULATORY STATUS COMMUNICATION OF NEEDS Skin   Extensive Assist Verbally Normal                       Personal Care Assistance Level of Assistance  Bathing, Feeding, Dressing Bathing Assistance: Limited assistance Feeding  assistance: Independent Dressing Assistance: Limited assistance     Functional Limitations Info  Sight, Hearing, Speech Sight Info: Adequate Hearing Info: Adequate Speech Info: Adequate    SPECIAL CARE FACTORS FREQUENCY  PT (By licensed PT), OT (By licensed OT)     PT Frequency: 5 OT Frequency: 5            Contractures Contractures Info: Not present    Additional Factors Info  Code Status, Allergies Code Status Info: Full Code  Allergies Info: Tylenol Acetaminophen, Aspirin           Current Medications (03/17/2018):  This is the current hospital active medication list Current Facility-Administered Medications  Medication Dose Route Frequency Provider Last Rate Last Dose  . [START ON 03/18/2018] atorvastatin (LIPITOR) tablet 80 mg  80 mg Oral q1800 Wieting, Richard, MD      . ceFAZolin (ANCEF) IVPB 1 g/50 mL premix  1 g Intravenous Q6H Alford Highland, MD   Stopped at 03/17/18 1551  . clopidogrel (PLAVIX) tablet 75 mg  75 mg Oral Daily Wieting, Richard, MD      . docusate sodium (COLACE) capsule 200 mg  200 mg Oral BID Wieting, Richard, MD      . enoxaparin (LOVENOX) injection 40 mg  40 mg Subcutaneous Q24H Wieting, Richard, MD      . ferrous sulfate tablet 325 mg  325 mg Oral BID Wieting, Richard, MD      . furosemide (LASIX) injection 40 mg  40 mg Intravenous BID Alford Highland, MD      . hydrocerin (EUCERIN) cream 1 application  1 application Topical BID Alford Highland, MD      . Melene Muller ON 03/18/2018] Influenza vac split quadrivalent PF (FLUZONE HIGH-DOSE) injection 0.5 mL  0.5 mL Intramuscular Tomorrow-1000 Alford Highland, MD      . Melene Muller ON 03/18/2018] metoprolol succinate (TOPROL-XL) 24 hr tablet 25 mg  25 mg Oral Daily Wieting, Richard, MD      . ondansetron [facility]) tablet 4 mg  4 mg Oral Q6H PRN Alford Highland, MD       Or  . ondansetron (ZOFRAN) injection 4 mg  4 mg Intravenous Q6H PRN Alford Highland, MD      . Melene Muller ON 03/18/2018] polyethylene  glycol (MIRALAX / GLYCOLAX) packet 17 g  17 g Oral Daily Wieting, Richard, MD      . potassium chloride (K-DUR,KLOR-CON) CR tablet 10 mEq  10 mEq Oral BID Wieting, Richard, MD      . sodium chloride flush (NS) 0.9 % injection 3 mL  3 mL Intravenous Q12H Wieting, Richard, MD      . sodium chloride flush (NS) 0.9 % injection 3 mL  3 mL Intravenous Q12H Alford Highland, MD         Discharge Medications: Please see discharge summary for a list of discharge medications.  Relevant Imaging Results:  Relevant Lab Results:   Additional Information SSN: [id number]  Ruthe Mannan, [place]

## 2018-03-17 NOTE — Care Management Note (Cosign Needed Addendum)
Case Management Note  Patient Details  Name: [name] MRN: [id number] Date of Birth: 09-27-1930  Subjective/Objective:  Patient came in through the ED with a fall and lymphedema. Spoke with his daughter Mrs. Lea [phone]. She states patient lives with her. He uses a walker. He is suppose to use a lymph pump but is non compiant with it. She is requesting a walker for patient. [facility] from [facility] Discuss home health and how it works in the home. She is agreeable and prefers [facility]. Referral to [facility] with [facility] for RN, PT, HHA and SW. Also ordered the wheelchair. Spoke with ED attending and she will complete HH orders and wheelchair note. PCP is Dr. Birdie Sons.                    Action/Plan: Patient suffers from lymphedema which impairs his ability to perform daily activities like ambulating, toileting, feeding, dressing, grooming and bathing in the home.  A walker will not resolve issue with performing activities of daily living.  A wheelchair will allow patient to safely perform daily activities.      Patient can safely propel the wheelchair in the home and has a caregiver who can provide assistance.      Expected Discharge Date:                  Expected Discharge Plan:  Home w Home Health Services  In-House Referral:  Clinical Social Work  Discharge planning Services  CM Consult  Post Acute Care Choice:  Durable Medical Equipment, Home Health Choice offered to:  Adult Children  DME Arranged:  Government social research officer DME Agency:  [facility]  HH Arranged:  RN, PT, Nurse's Aide, Social Work [employer] Agency:  [facility]  Status of Service:  Completed, signed off  If discussed at [employer], dates discussed:    Additional Comments:  Marily Memos, RN 03/17/2018, 9:59 AM

## 2018-03-17 NOTE — ED Notes (Signed)
Pt to ultrasound at this time.

## 2018-03-17 NOTE — Progress Notes (Signed)
Patient ID: [name], male   DOB: [birth year], 84 y.o.   MRN: [id number]  ACP note  Patient and daughter at bedside  Diagnosis:Lower extremity cellulitis, lower extremity edema and anasarca, left knee pain, acute on chronic diastolic congestive heart failure with aortic stenosis, hyperlipidemia, history of CAD.  CODE STATUS discussed and patient is a full code  Plan.  IV antibiotics for left lower extremity cellulitis violation.  Lower extremity edema and anasarca start diuresis with Lasix 40 mg IV twice daily.  Left knee pain start with an x-ray family requesting orthopedic consultation.  Send off her uric acid.  Acute on chronic diastolic congestive heart failure with aortic stenosis.  Usually with this combination prognosis is poor.  Time spent on ACP discussion 17 minutes Dr. Alford Highland

## 2018-03-17 NOTE — Clinical Social Work Note (Signed)
Clinical Social Work Assessment  Patient Details  Name: [name] MRN: [id number] Date of Birth: 1930/06/26  Date of referral:  03/17/18               Reason for consult:  Facility Placement                Permission sought to share information with:  Case Manager, Customer service manager, Family Supports Permission granted to share information::  Yes, Verbal Permission Granted  Name::      SNF  Agency::   Media   Relationship::     Contact Information:     Housing/Transportation Living arrangements for the past 2 months:  [place] of Information:  Patient Patient Interpreter Needed:  None Criminal Activity/Legal Involvement Pertinent to Current Situation/Hospitalization:  No - Comment as needed Significant Relationships:  Adult Children Lives with:  Adult Children Do you feel [name] going back to the place where you live?  Yes Need for family participation in patient care:  Yes (Comment)  Care giving concerns:  Patient lives with daughter and son in law in [place] Worker assessment / plan:  CSW consulted while patient was in the ED for SNF placement. CSW met with patient to discuss discharge plan. CSW explained medicare guidelines and 3 night rule for SNF placement. Patient states that he does not want to be placed but wants to go home. CSW explained that home health could be arranged and he could be discharged home. CSW also contacted patient's daughter [name] [phone] and told her above information. Daughter states that she will pick up patient.  CSW was contacted by RN because patient is unable to stand and MD would like to admit patient. CSW explained that patient would have to be inpatient for 3 midnights in order to qualify for SNF placement. Per daughter she is more concerned about the swelling and medical issues than placing patient in facility. CSW will initiate bed search in case it is needed. CSW will follow patient for  discharge planning.   Employment status:  Retired Forensic scientist:  Medicare PT Recommendations:  Not assessed at this time Information / Referral to community resources:  [place]  Patient/Family's Response to care:  Patient and daughter thanked CSW for assistance   Patient/Family's Understanding of and Emotional Response to Diagnosis, Current Treatment, and Prognosis:  Patient and daughter understand current diagnoses and treatment   Emotional Assessment Appearance:  Appears stated age Attitude/Demeanor/Rapport:    Affect (typically observed):  Accepting, Guarded Orientation:  Oriented to Self, Oriented to Place, Oriented to  Time, Oriented to Situation Alcohol / Substance use:  Not Applicable Psych involvement (Current and /or in the community):  No (Comment)  Discharge Needs  Concerns to be addressed:  Discharge Planning Concerns Readmission within the last 30 days:  No Current discharge risk:  Dependent with Mobility Barriers to Discharge:  Continued Medical Work up   [employer], [place] 03/17/2018, 5:12 PM

## 2018-03-17 NOTE — ED Notes (Signed)
This RN in with ed tech Zach to assist pt to his feet. Pt is unable to stand. Pt unable to support his weight. Pt placed back in bed and family called to the bedside. MD Rifenbark made aware.

## 2018-03-17 NOTE — Progress Notes (Signed)
Admitted from the ED with severe lymphedema of his lower extremities. Right worse than Left.  Skin is tight from the knees down. No c/o pain at this time.

## 2018-03-17 NOTE — H&P (Addendum)
[facility] [place] at [facility]   PATIENT NAME: [name]    MR#:  [id number]  DATE OF BIRTH:  1930/07/01  DATE OF ADMISSION:  03/16/2018  PRIMARY CARE PHYSICIAN: Glori Luis, MD   REQUESTING/REFERRING PHYSICIAN: Dr Virgilio Frees  CHIEF COMPLAINT:  Leg swelling and left knee pain  HISTORY OF PRESENT ILLNESS:  [name]  is a 84 y.o. male with a known history of chronic edema and lymphedema presents with left knee pain and worsening swelling.  As per family he is not been using his lymphedema treatments as he should.  Patient has not been able to walk or even stand on his own for the last few days.  Normally he is able to walk with a cane.  Yesterday he could hardly even get out of a chair.  He could not stand on his own.  He came in yesterday and they gave him a dose of Lasix and sent him home.  Swelling has been worse.  ER physician was unable to get him to stand.  Family states the redness on his legs is worse than usual.  PAST MEDICAL HISTORY:   Past Medical History:  Diagnosis Date  . Aortic atherosclerosis (HCC) 05/2013   Per TEE  . Arrhythmia   . Asthma   . CHF (congestive heart failure) (HCC)   . Colon polyp   . Coronary artery disease    a. Nonobstructive by 2004 cath b. Nonobstructive by 2015 cath in setting of NSTEMI  . GERD (gastroesophageal reflux disease)   . Heart murmur   . Hiatal hernia   . Hyperlipidemia   . Hypertension   . Lymphedema   . Moderate aortic stenosis    a. echo 2013: EF 55%, nl wall motion, mild DD, mild LVH, trace MR, moderate AS with peak gradient of 55 mm Hg; b. TEE 05/2013: EF EF 60-65%, borderline LVH, mild MR/AI, mod AS with valve area 1.0-1.26; c. echo 10/2014: 55-60%, no RWMA, GR1DD, mod AS w/ mean gradient of 28 mm Hg     . Obesity   . Onychomycosis   . Osteoarthritis     PAST SURGICAL HISTORY:   Past Surgical History:  Procedure Laterality Date  . CARDIAC CATHETERIZATION  11/2002  . CARDIAC  CATHETERIZATION  05/21/2013   showing 30% oLM, 30% D1, 20% mLCx, 30% pRCA; EF 60%, severe AS (mean gradient 28 mmHg, peak 26, area 0.9)  . CHOLECYSTECTOMY  2001  . TRANSESOPHAGEAL ECHOCARDIOGRAM  05/22/2013   Preserved EF, moderate AS (valve area by planimetry between 1.0-1.26 cm sq), moderate aortic arch and descending aortic atherosclerosis.   . TRANSTHORACIC ECHOCARDIOGRAM  05/19/2013   EF 60-65%, impaired diastolic function, mild LA dilatation, mild MR/AI/TR, mod AS, high normal RVSP (30.4 mmHg)    SOCIAL HISTORY:   Social History   Tobacco Use  . Smoking status: Former Smoker    Packs/Leibold: 1.50    Years: 20.00    Pack years: 30.00    Types: Cigarettes    Last attempt to quit: 02/22/1981    Years since quitting: 37.0  . Smokeless tobacco: Never Used  Substance Use Topics  . Alcohol use: No    FAMILY HISTORY:   Family History  Problem Relation Age of Onset  . Heart disease Mother   . Heart disease Father 5  . Stroke Brother     DRUG ALLERGIES:   Allergies  Allergen Reactions  . Tylenol [Acetaminophen]   . Aspirin Rash and Other (  See Comments)    Reaction: Trouble breathing     REVIEW OF SYSTEMS:  CONSTITUTIONAL: No fever, chills or sweats.  Positive for fatigue.  EYES: No blurred or double vision.  EARS, NOSE, AND THROAT: No tinnitus or ear pain. No sore throat RESPIRATORY: Positive for cough, and shortness of breath.  No wheezing or hemoptysis.  CARDIOVASCULAR: No chest pain, orthopnea, edema.  GASTROINTESTINAL: No nausea, vomiting, diarrhea or abdominal pain. No blood in bowel movements GENITOURINARY: No dysuria, hematuria.  ENDOCRINE: No polyuria, nocturia,  HEMATOLOGY: No anemia, easy bruising or bleeding SKIN: No rash or lesion. MUSCULOSKELETAL: Left knee pain NEUROLOGIC: No tingling, numbness, weakness.  PSYCHIATRY: No anxiety or depression.   MEDICATIONS AT HOME:   Prior to Admission medications   Medication Sig Start Date End Date Taking?  Authorizing Provider  acetaminophen (TYLENOL) 325 MG tablet Take 2 tablets (650 mg total) by mouth every 6 (six) hours as needed for mild pain (or Fever >/= 101). 09/29/17   Rodolph Bong, MD  atorvastatin (LIPITOR) 80 MG tablet Take 1 tablet (80 mg total) by mouth daily. 10/19/17   Delma Freeze, FNP  clopidogrel (PLAVIX) 75 MG tablet Take 1 tablet (75 mg total) by mouth daily. 10/19/17   Delma Freeze, FNP  docusate sodium (COLACE) 100 MG capsule Take 2 capsules (200 mg total) by mouth 2 (two) times daily. 08/10/17   Auburn Bilberry, MD  ferrous sulfate 325 (65 FE) MG EC tablet Take 1 tablet (325 mg total) by mouth 2 (two) times daily. 06/06/17   Glori Luis, MD  furosemide (LASIX) 40 MG tablet Take 1 tablet (40 mg total) by mouth 2 (two) times daily. 10/19/17   Delma Freeze, FNP  hydrocerin (EUCERIN) CREA Apply 1 application topically 2 (two) times daily. Apply to bilateral LEs and feet twice daily after cleansing with house skin cleanser and gently patting dry. No not apply between toes. 08/10/17   Auburn Bilberry, MD  losartan (COZAAR) 25 MG tablet Take 1 tablet (25 mg total) by mouth daily. 12/15/17 03/15/18  Antonieta Iba, MD  metoprolol succinate (TOPROL-XL) 25 MG 24 hr tablet Take 1 tablet (25 mg total) by mouth daily. 10/19/17   Delma Freeze, FNP  polyethylene glycol (MIRALAX / GLYCOLAX) packet Take 17 g by mouth daily. 08/10/17   Auburn Bilberry, MD  potassium chloride (K-DUR) 10 MEQ tablet Take 1 tablet (10 mEq total) by mouth 2 (two) times daily. 12/12/17   Delma Freeze, FNP      VITAL SIGNS:  Blood pressure (!) 130/54, pulse 78, temperature 98.7 F (37.1 C), temperature source Oral, resp. rate 14, height 5\' 11"  (1.803 m), weight 123 kg, SpO2 100 %.  PHYSICAL EXAMINATION:  GENERAL:  84 y.o.-year-old patient lying in the bed with no acute distress.  EYES: Pupils equal, round, reactive to light and accommodation. No scleral icterus. Extraocular muscles intact.  HEENT:  Head atraumatic, normocephalic. Oropharynx and nasopharynx clear.  NECK:  Supple, no jugular venous distention. No thyroid enlargement, no tenderness.  LUNGS: Normal breath sounds bilaterally, no wheezing, rales,rhonchi or crepitation. No use of accessory muscles of respiration.  CARDIOVASCULAR: S1, S2 normal. 3/6 systolic murmurs. No rubs, or gallops.  ABDOMEN: Soft, nontender, nondistended. Bowel sounds present. No organomegaly or mass.  EXTREMITIES: 4+ right lower extremity pedal edema, 2+ left lower extremity edema.  No cyanosis, or clubbing.  In trying to move the patient's left knee he has restricting me moving his knee.  Difficult exam.  NEUROLOGIC: Cranial nerves II through XII are intact. Muscle strength 5/5 in all extremities. Sensation intact. Gait not checked.  PSYCHIATRIC: The patient is alert and oriented x 3.  SKIN: Chronic lymphedema right lower extremity with erythema.  Left lower extremity positive erythema and warmth  LABORATORY PANEL:   CBC Recent Labs  Lab 03/16/18 1434  WBC 7.8  HGB 13.3  HCT 41.4  PLT 173   ------------------------------------------------------------------------------------------------------------------  Chemistries  Recent Labs  Lab 03/16/18 1434  NA 140  K 3.3*  CL 104  CO2 28  GLUCOSE 116*  BUN 21  CREATININE 0.95  CALCIUM 9.1   ------------------------------------------------------------------------------------------------------------------      EKG:   Yesterday's EKG shows sinus tachycardia 100 bpm, VPC, left atrial enlargement, right bundle branch block  IMPRESSION AND PLAN:   1.  Left lower extremity cellulitis.  Questionable cellulitis right lower extremity.  IV Ancef started 2.  Lower extremity edema and anasarca.  Start Lasix 40 mg IV twice daily.  Patient will need to do his lymphedema treatments at home.  Physical therapy evaluation. Ultrasound lower extremities. 3.  Left knee pain.  I am unable to move his left knee  very well without him resisting.  Start off with an x-ray.  Family requesting orthopedic consultation. Check uric acid. 4.  Acute on chronic diastolic congestive heart failure with aortic stenosis.  Obtain a chest x-ray.  Continue IV Lasix.  On metoprolol. 5.  Hyperlipidemia unspecified on atorvastatin 6.  History of CAD on Plavix and Toprol  All the records are reviewed and case discussed with ED provider. Management plans discussed with the patient, family and they are in agreement.  CODE STATUS: Full code  TOTAL TIME TAKING CARE OF THIS PATIENT: 50 minutes, including acp time   Alford Highland M.D on 03/17/2018 at 1:09 PM  Between 7am to 6pm - Pager - [phone]  After 6pm call admission pager [phone]  [facility] Office  [phone]  CC: Primary care physician; Glori Luis, MD

## 2018-03-18 ENCOUNTER — Inpatient Hospital Stay (HOSPITAL_COMMUNITY)
Admit: 2018-03-18 | Discharge: 2018-03-18 | Disposition: A | Discharge status: Home / Self Care | Payer: Medicare Other | Attending: Internal Medicine | Admitting: Internal Medicine

## 2018-03-18 DIAGNOSIS — I5030 Unspecified diastolic (congestive) heart failure: Secondary | ICD-10-CM

## 2018-03-18 LAB — BASIC METABOLIC PANEL
Anion gap: 8 (ref 5–15)
BUN: 21 mg/dL (ref 8–23)
CALCIUM: 8.4 mg/dL — AB (ref 8.9–10.3)
CO2: 30 mmol/L (ref 22–32)
CREATININE: 1.01 mg/dL (ref 0.61–1.24)
Chloride: 105 mmol/L (ref 98–111)
Glucose, Bld: 102 mg/dL — ABNORMAL HIGH (ref 70–99)
Potassium: 3.3 mmol/L — ABNORMAL LOW (ref 3.5–5.1)
SODIUM: 143 mmol/L (ref 135–145)

## 2018-03-18 LAB — ECHOCARDIOGRAM COMPLETE
HEIGHTINCHES: 71 in
WEIGHTICAEL: 4235.2 [oz_av]

## 2018-03-18 LAB — CBC
HCT: 33.9 % — ABNORMAL LOW (ref 39.0–52.0)
Hemoglobin: 10.9 g/dL — ABNORMAL LOW (ref 13.0–17.0)
MCH: 27.3 pg (ref 26.0–34.0)
MCHC: 32.2 g/dL (ref 30.0–36.0)
MCV: 84.8 fL (ref 80.0–100.0)
NRBC: 0 % (ref 0.0–0.2)
PLATELETS: 174 10*3/uL (ref 150–400)
RBC: 4 MIL/uL — ABNORMAL LOW (ref 4.22–5.81)
RDW: 13.6 % (ref 11.5–15.5)
WBC: 6.4 10*3/uL (ref 4.0–10.5)

## 2018-03-18 MED ORDER — TRIAMCINOLONE ACETONIDE 40 MG/ML IJ SUSP
40.0000 mg | Freq: Once | INTRAMUSCULAR | Status: AC
  Administered 2018-03-18: 40 mg via INTRA_ARTICULAR
  Filled 2018-03-18: qty 1

## 2018-03-18 MED ORDER — BUPIVACAINE HCL (PF) 0.5 % IJ SOLN
10.0000 mL | Freq: Once | INTRAMUSCULAR | Status: AC
  Administered 2018-03-18: 10 mL
  Filled 2018-03-18: qty 10

## 2018-03-18 NOTE — Progress Notes (Signed)
Patient reported his leg pain has returned and "the shot is not helping".  Patient attempted to work with the physical therapist.  He was not able to walk.  Christean Grief, RN

## 2018-03-18 NOTE — Consult Note (Signed)
Reason for consult is left knee pain  History patient does not recall any injury he has a history of left knee arthritis the last record I have him getting an injection was 2 years ago at [facility].  He had x-rays are nonweightbearing showing advanced tricompartmental osteoarthritis while here and is having difficulty bearing weight because of knee pain.  He has painful passive range of motion from about 10 to 40 degrees with a moderate effusion present.  The knee is not particularly warm and no evidence of septic joint.  impression is severe left knee osteoarthritis  Plan is for corticosteroid injection with Kenalog and Marcaine later today when it is available.

## 2018-03-18 NOTE — Progress Notes (Signed)
[facility] - [place] at [facility]   PATIENT NAME: [name]    MR#:  [id number]  DATE OF BIRTH:  1931/02/09  SUBJECTIVE: 84 year old male patient admitted for left knee pain, left leg cellulitis, acute on chronic diastolic heart failure.  CHIEF COMPLAINT:  No chief complaint on file. He says he had knee injection by Dr. Rosita Kea this morning and he feels much better after steroid injection the left knee, leg swelling decreased.  REVIEW OF SYSTEMS:   ROS CONSTITUTIONAL: No fever, fatigue or weakness.  EYES: No blurred or double vision.  EARS, NOSE, AND THROAT: No tinnitus or ear pain.  RESPIRATORY: No cough, shortness of breath, wheezing or hemoptysis.  CARDIOVASCULAR: No chest pain, orthopnea, edema.  GASTROINTESTINAL: No nausea, vomiting, diarrhea or abdominal pain.  GENITOURINARY: No dysuria, hematuria.  ENDOCRINE: No polyuria, nocturia,  HEMATOLOGY: No anemia, easy bruising or bleeding SKIN: No rash or lesion. MUSCULOSKELETAL: No joint pain or arthritis.   NEUROLOGIC: No tingling, numbness, weakness.  PSYCHIATRY: No anxiety or depression.   DRUG ALLERGIES:   Allergies  Allergen Reactions  . Tylenol [Acetaminophen]   . Aspirin Rash and Other (See Comments)    Reaction: Trouble breathing     VITALS:  Blood pressure (!) 124/54, pulse 76, temperature 97.7 F (36.5 C), temperature source Oral, resp. rate 18, height 5\' 11"  (1.803 m), weight 120.1 kg, SpO2 99 %.  PHYSICAL EXAMINATION:  GENERAL:  84 y.o.-year-old patient lying in the bed with no acute distress.  EYES: Pupils equal, round, reactive to light and accommodation. No scleral icterus. Extraocular muscles intact.  HEENT: Head atraumatic, normocephalic. Oropharynx and nasopharynx clear.  NECK:  Supple, no jugular venous distention. No thyroid enlargement, no tenderness.  LUNGS: Normal breath sounds bilaterally, no wheezing, rales,rhonchi or crepitation. No use of accessory muscles of  respiration.  CARDIOVASCULAR: S1-S2 normal, patient has ejection systolic murmur  murmur in aortic area.  ABDOMEN: Soft, nontender, nondistended. Bowel sounds present. No organomegaly or mass.  EXTREMITIES: Decreased leg edema. NEUROLOGIC: Cranial nerves II through XII are intact. Muscle strength 5/5 in all extremities. Sensation intact. Gait not checked.  PSYCHIATRIC: The patient is alert and oriented x 3.  SKIN: No obvious rash, lesion, or ulcer.    LABORATORY PANEL:   CBC Recent Labs  Lab 03/18/18 0420  WBC 6.4  HGB 10.9*  HCT 33.9*  PLT 174   ------------------------------------------------------------------------------------------------------------------  Chemistries  Recent Labs  Lab 03/18/18 0420  NA 143  K 3.3*  CL 105  CO2 30  GLUCOSE 102*  BUN 21  CREATININE 1.01  CALCIUM 8.4*   ------------------------------------------------------------------------------------------------------------------  Cardiac Enzymes No results for input(s): TROPONINI in the last 168 hours. ------------------------------------------------------------------------------------------------------------------  RADIOLOGY:  US Venous Img Lower Bilateral  Result Date: 03/17/2018 CLINICAL DATA:  Bilateral lower extremity edema for the past 6 months, worse during the past 3 days. Evaluate for DVT. EXAM: BILATERAL LOWER EXTREMITY VENOUS DOPPLER ULTRASOUND TECHNIQUE: Gray-scale sonography with graded compression, as well as color Doppler and duplex ultrasound were performed to evaluate the lower extremity deep venous systems from the level of the common femoral vein and including the common femoral, femoral, profunda femoral, popliteal and calf veins including the posterior tibial, peroneal and gastrocnemius veins when visible. The superficial great saphenous vein was also interrogated. Spectral Doppler was utilized to evaluate flow at rest and with distal augmentation maneuvers in the common femoral,  femoral and popliteal veins. COMPARISON:  None. FINDINGS: RIGHT LOWER EXTREMITY Common Femoral Vein: No evidence of  thrombus. Normal compressibility, respiratory phasicity and response to augmentation. Saphenofemoral Junction: No evidence of thrombus. Normal compressibility and flow on color Doppler imaging. Profunda Femoral Vein: No evidence of thrombus. Normal compressibility and flow on color Doppler imaging. Femoral Vein: No evidence of thrombus. Normal compressibility, respiratory phasicity and response to augmentation. Popliteal Vein: No evidence of thrombus. Normal compressibility, respiratory phasicity and response to augmentation. Calf Veins: No evidence of thrombus. Normal compressibility and flow on color Doppler imaging. Superficial Great Saphenous Vein: No evidence of thrombus. Normal compressibility. Venous Reflux:  None. Other Findings:  None. LEFT LOWER EXTREMITY Common Femoral Vein: No evidence of thrombus. Normal compressibility, respiratory phasicity and response to augmentation. Saphenofemoral Junction: No evidence of thrombus. Normal compressibility and flow on color Doppler imaging. Profunda Femoral Vein: No evidence of thrombus. Normal compressibility and flow on color Doppler imaging. Femoral Vein: No evidence of thrombus. Normal compressibility, respiratory phasicity and response to augmentation. Popliteal Vein: No evidence of thrombus. Normal compressibility, respiratory phasicity and response to augmentation. Calf Veins: No evidence of thrombus. Normal compressibility and flow on color Doppler imaging. Superficial Great Saphenous Vein: No evidence of thrombus. Normal compressibility. Venous Reflux:  None. Other Findings:  None. IMPRESSION: No evidence of DVT within either lower extremity. Electronically Signed   By: Simonne Come M.D.   On: 03/17/2018 15:23   Dg Chest Port 1 View  Result Date: 03/17/2018 CLINICAL DATA:  Admit today. Per chart: Lower extremity cellulitis, lower extremity  edema and anasarca, left knee pain, acute on chronic diastolic congestive heart failure with aortic stenosis, hyperlipidemia, history of CAD. EXAM: PORTABLE CHEST 1 VIEW COMPARISON:  09/26/2017 FINDINGS: Heart size is accentuated by portable technique. There is patchy opacity at the LEFT lung base partially obscuring the hemidiaphragm consistent with atelectasis or early infiltrate. There is atherosclerotic calcification of the thoracic aorta. IMPRESSION: 1. Question developing LEFT LOWER lobe infiltrate versus atelectasis. 2.  Aortic atherosclerosis.  (ICD10-I70.0) Electronically Signed   By: Norva Pavlov M.D.   On: 03/17/2018 13:30   Dg Knee 3 Views Left  Result Date: 03/17/2018 CLINICAL DATA:  Swollen left knee.  No injury. EXAM: LEFT KNEE - 3 VIEW COMPARISON:  No prior. FINDINGS: Diffuse osteopenia and degenerative change. No acute bony abnormality identified. No evidence of fracture dislocation. Or vascular calcification. IMPRESSION: 1. Diffuse osteopenia and degenerative change. No acute bony abnormality identified. No evidence of fracture. 2.  Peripheral vascular disease. Electronically Signed   By: Maisie Fus  Register   On: 03/17/2018 13:30    EKG:   Orders placed or performed during the hospital encounter of 03/16/18  . EKG 12-Lead  . EKG 12-Lead  . ED EKG  . ED EKG    ASSESSMENT AND PLAN:   Left leg cellulitis, improved on the ring he has chronic leg edema.  He is on empiric antibiotics.  Patient also on IV Lasix to improve with edema. 2.  Left knee pain, degenerative arthritis, improved with steroid that was given in the left knee by orthopedic this morning. 3.  Acute on chronic diastolic heart failure, aortic stenosis, echocardiogram this morning, patient does have ejection systolic murmur aortic area.  Continue metoprolol.  Cardiogram done this morning, will follow the results. 4.  Hyperlipidemia: Continue statins 5.  History of CAD, patient on Plavix, beta-blockers.  Possible  discharge home tomorrow, do not think he needs any further antibiotics for his legs, does have chronic lymphedema hopefully we can discharge him home with Lasix.   All the records are reviewed  and case discussed with Care Management/Social Workerr. Management plans discussed with the patient, family and they are in agreement.  CODE STATUS: Full code  TOTAL TIME TAKING CARE OF THIS PATIENT: 35 minutes.   POSSIBLE D/C IN 1-2 DAYS, DEPENDING ON CLINICAL CONDITION.   Katha Hamming M.D on 03/18/2018 at 10:56 AM  Between 7am to 6pm - Pager - [phone]  After 6pm go to [email or web address] - password [email or web address] [facility]  [facility]  Office  [phone]  CC: Primary care physician; Glori Luis, MD   Note: This dictation was prepared with Dragon dictation along with smaller phrase technology. Any transcriptional errors that result from this process are unintentional.

## 2018-03-18 NOTE — Evaluation (Signed)
Physical Therapy Evaluation Patient Details Name: [name] MRN: [id number] DOB: [birth year] Today's Date: 03/18/2018   History of Present Illness  Patient is an 84 year old male admitted for cellulitis s/p c/o SOB and severe knee pain.  PMH includes CHF, obesity, Htn, CAD, asthma, arrythmia and hiatal hernia.  Clinical Impression  Pt is an 84 year old male who lives in a single story apartment with his daughter.  Pt is dependent on a SPC for short distance and WC for longer distances at baseline.  Pt in bed with visible edema of LE's upon PT arrival.  He was able to get to EOB with mod-max A for support of LE's and hand held assist for upright posture.  He was able to sit with good balance at EOB and presented with fait to good strength of UE's/LE's.  Pt attempted to scoot along EOB but was unable to make much progress due to L knee pain.  He reported that his knee does not feel better after the injection.  Pt required heavy assist to return to bed and was able to use UE's and unilateral LE to assist PT in scooting him up in bed.  Pt willing to participate in there ex and required education and manual cues to complete.  Pt will continue to benefit from skilled PT with focus on strength, tolerance to activity, pain management and fall prevention.    Follow Up Recommendations SNF    Equipment Recommendations  Rolling walker with 5" wheels    Recommendations for Other Services       Precautions / Restrictions Precautions Precautions: Fall Precaution Comments: High fall risk-fall related to admission. Restrictions Weight Bearing Restrictions: No      Mobility  Bed Mobility Overal bed mobility: Needs Assistance Bed Mobility: Supine to Sit;Sit to Supine     Supine to sit: Max assist;Mod assist Sit to supine: Max assist   General bed mobility comments: Support of LE's and VC's for body mechanics.  Pt able to assist in scooting up in bed with UE's and unilateral LE.  Pt able to use UE's  and LE's to scoot along EOB minimally.  Transfers Overall transfer level: (Deferred due to pt reported severe pain.)                  Ambulation/Gait                Stairs            Wheelchair Mobility    Modified Rankin (Stroke Patients Only)       Balance Overall balance assessment: Mild deficits observed, not formally tested                                           Pertinent Vitals/Pain Pain Assessment: Faces Faces Pain Scale: Hurts whole lot Pain Location: L knee Pain Intervention(s): Monitored during session;Limited activity within patient's tolerance    Home Living Family/patient expects to be discharged to:: Private residence Living Arrangements: Children Available Help at Discharge: Family;Available PRN/intermittently(Daughter will not be able to assist pt with transfers.) Type of Home: Apartment Home Access: Level entry     Home Layout: One level Home Equipment: Cane - single point;Wheelchair - power      Prior Function Level of Independence: Independent with assistive device(s)         Comments: Pt reports walking with cane  and driving as recently as 3 weeks ago.     Hand Dominance        Extremity/Trunk Assessment   Upper Extremity Assessment Upper Extremity Assessment: Overall WFL for tasks assessed    Lower Extremity Assessment Lower Extremity Assessment: Overall WFL for tasks assessed(Grossly 4+/5 bilaterally when not limited by pain.)    Cervical / Trunk Assessment Cervical / Trunk Assessment: Normal  Communication   Communication: HOH  Cognition Arousal/Alertness: Awake/alert Behavior During Therapy: Restless Overall Cognitive Status: Within Functional Limits for tasks assessed                                 General Comments: Follows commands consistently      General Comments      Exercises General Exercises - Lower Extremity Ankle Circles/Pumps: Strengthening;Both;20  reps;Supine Quad Sets: Strengthening;Both;10 reps;Supine Gluteal Sets: Strengthening;Both;10 reps;Supine Hip ABduction/ADduction: AAROM;Both;10 reps;Supine Straight Leg Raises: AAROM;Both;5 reps   Assessment/Plan    PT Assessment Patient needs continued PT services  PT Problem List Decreased strength;Decreased mobility;Decreased balance;Decreased knowledge of use of DME;Decreased activity tolerance;Pain       PT Treatment Interventions DME instruction;Therapeutic activities;Gait training;Therapeutic exercise;Patient/family education;Balance training;Functional mobility training;Neuromuscular re-education    PT Goals (Current goals can be found in the Care Plan section)  Acute Rehab PT Goals Patient Stated Goal: To return home as soon as possible. PT Goal Formulation: With patient Time For Goal Achievement: 04/08/18 Potential to Achieve Goals: Good    Frequency Min 2X/week   Barriers to discharge        Co-evaluation               AM-PAC PT "6 Clicks" Daily Activity  Outcome Measure Difficulty turning over in bed (including adjusting bedclothes, sheets and blankets)?: A Lot Difficulty moving from lying on back to sitting on the side of the bed? : A Lot Difficulty sitting down on and standing up from a chair with arms (e.g., wheelchair, bedside commode, etc,.)?: Unable Help needed moving to and from a bed to chair (including a wheelchair)?: A Lot Help needed walking in hospital room?: Total Help needed climbing 3-5 steps with a railing? : Total 6 Click Score: 9    End of Session   Activity Tolerance: Patient limited by pain;Patient limited by fatigue Patient left: in bed;with bed alarm set;with call bell/phone within reach Nurse Communication: Mobility status PT Visit Diagnosis: Muscle weakness (generalized) (M62.81);Pain Pain - Right/Left: Left Pain - part of body: Knee    Time: 1610-9604 PT Time Calculation (min) (ACUTE ONLY): 26 min   Charges:   PT  Evaluation $PT Eval Moderate Complexity: 1 Mod PT Treatments $Therapeutic Exercise: 8-22 mins        Glenetta Hew, PT, DPT   Glenetta Hew 03/18/2018, 5:40 PM

## 2018-03-18 NOTE — Op Note (Signed)
Preprocedure diagnosis is left knee osteoarthritis Post procedure diagnosis is same Procedure: Left knee intra-articular corticosteroid injection Anesthesia: Local marcaine 0.5% without epinephrine Description of procedure: After obtaining informed consent and verifying site, the anterior medial knee was prepped with chlorhexidine and allowed to dry.  A 25-gauge needle was then inserted anterior medially into the joint and 40 mg Kenalog 5 cc Marcaine injected.  Needle was withdrawn and a Band-Aid applied with no bleeding noted.  Patient tolerated the procedure well

## 2018-03-19 MED ORDER — CEPHALEXIN 500 MG PO CAPS
500.0000 mg | ORAL_CAPSULE | Freq: Two times a day (BID) | ORAL | Status: DC
  Administered 2018-03-19 – 2018-03-20 (×3): 500 mg via ORAL
  Filled 2018-03-19 (×3): qty 1

## 2018-03-19 NOTE — Progress Notes (Signed)
[facility] - [place] at [facility]   PATIENT NAME: [name]    MR#:  [id number]  DATE OF BIRTH:  09-Apr-1931  SUBJECTIVE: .  Patient feels better today, decreased left knee pain.  CHIEF COMPLAINT:  No chief complaint on file.   REVIEW OF SYSTEMS:   ROS CONSTITUTIONAL: No fever, fatigue or weakness.  EYES: No blurred or double vision.  EARS, NOSE, AND THROAT: No tinnitus or ear pain.  RESPIRATORY: No cough, shortness of breath, wheezing or hemoptysis.  CARDIOVASCULAR: No chest pain, orthopnea, edema.  GASTROINTESTINAL: No nausea, vomiting, diarrhea or abdominal pain.  GENITOURINARY: No dysuria, hematuria.  ENDOCRINE: No polyuria, nocturia,  HEMATOLOGY: No anemia, easy bruising or bleeding SKIN: No rash or lesion. MUSCULOSKELETAL: No joint pain or arthritis.   NEUROLOGIC: No tingling, numbness, weakness.  PSYCHIATRY: No anxiety or depression.   DRUG ALLERGIES:   Allergies  Allergen Reactions  . Tylenol [Acetaminophen]   . Aspirin Rash and Other (See Comments)    Reaction: Trouble breathing     VITALS:  Blood pressure (!) 120/48, pulse 71, temperature 98.4 F (36.9 C), resp. rate 18, height 5\' 11"  (1.803 m), weight 120.1 kg, SpO2 100 %.  PHYSICAL EXAMINATION:  GENERAL:  84 y.o.-year-old patient lying in the bed with no acute distress.  EYES: Pupils equal, round, reactive to light and accommodation. No scleral icterus. Extraocular muscles intact.  HEENT: Head atraumatic, normocephalic. Oropharynx and nasopharynx clear.  NECK:  Supple, no jugular venous distention. No thyroid enlargement, no tenderness.  LUNGS: Normal breath sounds bilaterally, no wheezing, rales,rhonchi or crepitation. No use of accessory muscles of respiration.  CARDIOVASCULAR: S1-S2 normal, patient has ejection systolic murmur  murmur in aortic area.  ABDOMEN: Soft, nontender, nondistended. Bowel sounds present. No organomegaly or mass.  EXTREMITIES: Decreased leg edema.  Both  legs are swollen with chronic lymphedema but more swelling, lymphedema on the right leg compared to left leg.    NEUROLOGIC: Cranial nerves II through XII are intact. Muscle strength 5/5 in all extremities. Sensation intact. Gait not checked.  PSYCHIATRIC: The patient is alert and oriented x 3.  SKIN: No obvious rash, lesion, or ulcer.    LABORATORY PANEL:   CBC Recent Labs  Lab 03/18/18 0420  WBC 6.4  HGB 10.9*  HCT 33.9*  PLT 174   ------------------------------------------------------------------------------------------------------------------  Chemistries  Recent Labs  Lab 03/18/18 0420  NA 143  K 3.3*  CL 105  CO2 30  GLUCOSE 102*  BUN 21  CREATININE 1.01  CALCIUM 8.4*   ------------------------------------------------------------------------------------------------------------------  Cardiac Enzymes No results for input(s): TROPONINI in the last 168 hours. ------------------------------------------------------------------------------------------------------------------  RADIOLOGY:  US Venous Img Lower Bilateral  Result Date: 03/17/2018 CLINICAL DATA:  Bilateral lower extremity edema for the past 6 months, worse during the past 3 days. Evaluate for DVT. EXAM: BILATERAL LOWER EXTREMITY VENOUS DOPPLER ULTRASOUND TECHNIQUE: Gray-scale sonography with graded compression, as well as color Doppler and duplex ultrasound were performed to evaluate the lower extremity deep venous systems from the level of the common femoral vein and including the common femoral, femoral, profunda femoral, popliteal and calf veins including the posterior tibial, peroneal and gastrocnemius veins when visible. The superficial great saphenous vein was also interrogated. Spectral Doppler was utilized to evaluate flow at rest and with distal augmentation maneuvers in the common femoral, femoral and popliteal veins. COMPARISON:  None. FINDINGS: RIGHT LOWER EXTREMITY Common Femoral Vein: No evidence of  thrombus. Normal compressibility, respiratory phasicity and response to augmentation. Saphenofemoral Junction:  No evidence of thrombus. Normal compressibility and flow on color Doppler imaging. Profunda Femoral Vein: No evidence of thrombus. Normal compressibility and flow on color Doppler imaging. Femoral Vein: No evidence of thrombus. Normal compressibility, respiratory phasicity and response to augmentation. Popliteal Vein: No evidence of thrombus. Normal compressibility, respiratory phasicity and response to augmentation. Calf Veins: No evidence of thrombus. Normal compressibility and flow on color Doppler imaging. Superficial Great Saphenous Vein: No evidence of thrombus. Normal compressibility. Venous Reflux:  None. Other Findings:  None. LEFT LOWER EXTREMITY Common Femoral Vein: No evidence of thrombus. Normal compressibility, respiratory phasicity and response to augmentation. Saphenofemoral Junction: No evidence of thrombus. Normal compressibility and flow on color Doppler imaging. Profunda Femoral Vein: No evidence of thrombus. Normal compressibility and flow on color Doppler imaging. Femoral Vein: No evidence of thrombus. Normal compressibility, respiratory phasicity and response to augmentation. Popliteal Vein: No evidence of thrombus. Normal compressibility, respiratory phasicity and response to augmentation. Calf Veins: No evidence of thrombus. Normal compressibility and flow on color Doppler imaging. Superficial Great Saphenous Vein: No evidence of thrombus. Normal compressibility. Venous Reflux:  None. Other Findings:  None. IMPRESSION: No evidence of DVT within either lower extremity. Electronically Signed   By: Simonne Come M.D.   On: 03/17/2018 15:23   Dg Chest Port 1 View  Result Date: 03/17/2018 CLINICAL DATA:  Admit today. Per chart: Lower extremity cellulitis, lower extremity edema and anasarca, left knee pain, acute on chronic diastolic congestive heart failure with aortic stenosis,  hyperlipidemia, history of CAD. EXAM: PORTABLE CHEST 1 VIEW COMPARISON:  09/26/2017 FINDINGS: Heart size is accentuated by portable technique. There is patchy opacity at the LEFT lung base partially obscuring the hemidiaphragm consistent with atelectasis or early infiltrate. There is atherosclerotic calcification of the thoracic aorta. IMPRESSION: 1. Question developing LEFT LOWER lobe infiltrate versus atelectasis. 2.  Aortic atherosclerosis.  (ICD10-I70.0) Electronically Signed   By: Norva Pavlov M.D.   On: 03/17/2018 13:30   Dg Knee 3 Views Left  Result Date: 03/17/2018 CLINICAL DATA:  Swollen left knee.  No injury. EXAM: LEFT KNEE - 3 VIEW COMPARISON:  No prior. FINDINGS: Diffuse osteopenia and degenerative change. No acute bony abnormality identified. No evidence of fracture dislocation. Or vascular calcification. IMPRESSION: 1. Diffuse osteopenia and degenerative change. No acute bony abnormality identified. No evidence of fracture. 2.  Peripheral vascular disease. Electronically Signed   By: Maisie Fus  Register   On: 03/17/2018 13:30    EKG:   Orders placed or performed during the hospital encounter of 03/16/18  . EKG 12-Lead  . EKG 12-Lead  . ED EKG  . ED EKG    ASSESSMENT AND PLAN:   Left leg cellulitis, improved.  Continue Keflex and discontinue IV antibiotics.  2.  Left knee pain, degenerative arthritis, improved with steroid that was given in the left knee by orthopedic .  He says his left knee is not hurting anymore.   3.  Acute on chronic diastolic heart failure, aortic stenosis, e echocardiogram showed moderate to severe aortic stenosis will consult cardiology..  Patient follows up with [facility].  4.  Hyperlipidemia: Continue statins 5.  History of CAD, patient on Plavix, beta-blockers. #6 chronic lymphedema, patient to wear compression stockings. #7 deconditioning, physical therapy recommended rehab placement All the records are reviewed and case  discussed with Care Management/Social Workerr. Management plans discussed with the patient, family and they are in agreement.  CODE STATUS: Full code  TOTAL TIME TAKING CARE OF THIS PATIENT: 35  minutes.   POSSIBLE D/C IN 1-2 DAYS, DEPENDING ON CLINICAL CONDITION.   Katha Hamming M.D on 03/19/2018 at 1:09 PM  Between 7am to 6pm - Pager - [phone]  After 6pm go to [email or web address] - password [email or web address] [facility]  [place] [facility]  Office  [phone]  CC: Primary care physician; Glori Luis, MD   Note: This dictation was prepared with Dragon dictation along with smaller phrase technology. Any transcriptional errors that result from this process are unintentional.

## 2018-03-20 MED ORDER — CEPHALEXIN 500 MG PO CAPS: 500.0000 mg | ORAL_CAPSULE | Freq: Two times a day (BID) | ORAL | 0 refills | Status: DC

## 2018-03-20 NOTE — Progress Notes (Signed)
Physical Therapy Treatment Patient Details Name: [name] MRN: [id number] DOB: August 28, 1930 Today's Date: 03/20/2018    History of Present Illness Patient is an 84 year old male admitted for cellulitis s/p c/o SOB and severe knee pain.  PMH includes CHF, obesity, Htn, CAD, asthma, arrythmia and hiatal hernia.    PT Comments    Pt presents with deficits in strength, transfers, mobility, gait, balance, and activity tolerance.  Pt required Min A with bed mobility tasks and extensive +2 assist to stand from an elevated EOB.  Once in standing pt was unable to come to full upright position standing instead with B knees and trunked flexed with heavy leaning on the RW.  Pt given cues to attempt to take a step backward towards the EOB but was unable to advance either LE and quickly returned to sitting.  Pt presents with significant functional weakness and is at a very high risk for falls making a return to his prior living situation unsafe at this time.  Pt will benefit from PT services in a SNF setting upon discharge to safely address above deficits for decreased caregiver assistance and eventual return to PLOF.     Follow Up Recommendations  SNF     Equipment Recommendations  Rolling walker with 5" wheels    Recommendations for Other Services       Precautions / Restrictions Precautions Precautions: Fall Precaution Comments: High fall risk-fall related to admission. Restrictions Weight Bearing Restrictions: No    Mobility  Bed Mobility Overal bed mobility: Needs Assistance Bed Mobility: Supine to Sit;Sit to Supine     Supine to sit: Min assist Sit to supine: Min assist   General bed mobility comments: Min A for BLEs in and out of bed and for final positioning  Transfers Overall transfer level: Needs assistance Equipment used: Rolling walker (2 wheeled) Transfers: Sit to/from Stand Sit to Stand: +2 physical assistance;+2 safety/equipment;Mod assist;From elevated surface          General transfer comment: Pt required EOB to be elevated and then +2 mod A to stand and was unable to come to full upright standing, stood with B knees and trunk in a flexed position  Ambulation/Gait             General Gait Details: Pt attempted to take a step backward toward the bed once in standing but was unable to do so and quickly returned to sitting at the EOB   Stairs             Wheelchair Mobility    Modified Rankin (Stroke Patients Only)       Balance Overall balance assessment: Mild deficits observed, not formally tested                                          Cognition Arousal/Alertness: Awake/alert Behavior During Therapy: WFL for tasks assessed/performed Overall Cognitive Status: Within Functional Limits for tasks assessed                                 General Comments: Follows commands consistently      Exercises Total Joint Exercises Ankle Circles/Pumps: AROM;Both;5 reps;10 reps Quad Sets: Strengthening;Both;5 reps;10 reps Gluteal Sets: Strengthening;Both;5 reps;10 reps Short Arc Quad: AROM;Both;10 reps Hip ABduction/ADduction: AROM;AAROM;Both;10 reps Long Arc Quad: AROM;Both;5 reps;10 reps Knee Flexion: AROM;Both;5 reps;10  reps Marching in Standing: AROM;Both;10 reps;Seated Other Exercises Other Exercises: Scooting L and R in sitting at EOB    General Comments        Pertinent Vitals/Pain Pain Assessment: 0-10 Pain Score: 1  Pain Location: L knee Pain Descriptors / Indicators: Sore Pain Intervention(s): Monitored during session;Limited activity within patient's tolerance    Home Living                      Prior Function            PT Goals (current goals can now be found in the care plan section)      Frequency    Min 2X/week      PT Plan Current plan remains appropriate    Co-evaluation              AM-PAC PT "6 Clicks" Daily Activity  Outcome Measure                    End of Session Equipment Utilized During Treatment: Gait belt Activity Tolerance: Patient limited by pain;Patient limited by fatigue Patient left: in bed;with bed alarm set;with call bell/phone within reach Nurse Communication: Mobility status PT Visit Diagnosis: Muscle weakness (generalized) (M62.81);Pain;Difficulty in walking, not elsewhere classified (R26.2) Pain - Right/Left: Left Pain - part of body: Knee     Time: 1610-9604 PT Time Calculation (min) (ACUTE ONLY): 24 min  Charges:  $Therapeutic Exercise: 8-22 mins $Therapeutic Activity: 8-22 mins                     D. Scott Berton Butrick PT, DPT 03/20/18, 2:02 PM

## 2018-03-20 NOTE — Progress Notes (Signed)
Pt request I assigned nurse, Luther Parody., contact daughter for pt. Brother's phone number so that pt could secure ride home b/c pt's daughter had a procedure today she is unable to provide transportation. In discussing plan of care update with daughter. Daughter stated [facility] was supposed to supply wheel chair prior to d/c when pt was in ED. When pt was admitted [facility] stated they would bring wheelchair back at discharge. This information is per daughter. Daughter also states that she "is unable to care for him if he cannot get around from bed to bathroom". CSW is following up before proceeding with d/c .

## 2018-03-20 NOTE — Progress Notes (Signed)
Pt transition care home with home health. Pt and daughter present to review discharge instructions.  No questions at discharge. Transportation provided via daughter and another family member

## 2018-03-20 NOTE — Progress Notes (Signed)
Patient is refusing rehab placement, will discharge the patient home with home health physical therapy, RN.  Continue antibiotics for 2 days for right leg cellulitis.  Patient has history of CAD, asthma, hypertension, history of aortic stenosis.  Patient has moderate aortic stenosis, cardiology to see as outpatient.  Discharge instructions on the computer discharge home with home health physical therapy, nurse.  Patient advised to follow-up at wound care regarding his lymphedema, oral wraps, followed up with Dr.Dewas an outpatient.

## 2018-03-20 NOTE — Care Management (Signed)
Discussed going home with going to a skilled nursing facility for rehabilitation. States he still prefers to go home with Home Health services in place. Gwenette Greet RN MSN CCM Care Management [phone]

## 2018-03-20 NOTE — Care Management (Signed)
Discharge to home today per Dr. Luberta Mutter.  [name] wants to go home with home health versus skilled nursing facility.  Services have been arranged per [facility]. Vaughan Basta , [facility] representative updated.  Daughter will transport. Gwenette Greet RN MSN CCM Care management [phone]

## 2018-03-20 NOTE — Progress Notes (Signed)
Discussed with daughter, patient daughter will come and pick up him but she has appointment at [facility] this morning.  Requesting 1 more session of physical therapy to see how much he can do, explained to daughter that he refused Placement and wants to go home with home health physical therapy.  Patient PCP, cardiologist Dr. Mariah Milling will see patient regarding her aortic stenosis follow-up.

## 2018-03-21 ENCOUNTER — Telehealth: Payer: Self-pay | Admitting: Family Medicine

## 2018-03-21 NOTE — Telephone Encounter (Signed)
Transition Care Management Follow-up Telephone Call  How have you been since you were released from the hospital? Patient Stated he is feeling better since leaving hospital home health has called to set up care.   Do you understand why you were in the hospital? yes   Do you understand the discharge instrcutions? yes  Items Reviewed:  Medications reviewed: yes  Allergies reviewed: yes  Dietary changes reviewed: yes  Referrals reviewed: yes   Functional Questionnaire:   Activities of Daily Living (ADLs):   He states they are independent in the following: ambulation, bathing and hygiene, feeding, continence, grooming, toileting and dressing States they require assistance with the following: Requires no assistance now.   Any transportation issues/concerns?: no   Any patient concerns? no   Confirmed importance and date/time of follow-up visits scheduled: yes   Confirmed with patient if condition begins to worsen call PCP or go to the ER.  Patient was given the Call-a-Nurse line [phone]: yes

## 2018-03-21 NOTE — Telephone Encounter (Signed)
Can I place patient at a 3.15 appointment for HFU?

## 2018-03-22 ENCOUNTER — Ambulatory Visit: Payer: Medicare Other | Admitting: Family Medicine

## 2018-03-22 NOTE — Telephone Encounter (Signed)
Scheduled

## 2018-03-22 NOTE — Telephone Encounter (Signed)
He could be placed in a 4:30 time slot this Friday. Please do not use a 3:15 slot.

## 2018-03-22 NOTE — Discharge Summary (Signed)
[name], is a 84 y.o. male  DOB 1930/10/30  MRN [id number].  Admission date:  03/16/2018  Admitting Physician  Alford Highland, MD  Discharge Date:  03/22/2018   Primary MD  Glori Luis, MD  Recommendations for primary care physician for things to follow:   Follow-up with PCP in 1 week   Admission Diagnosis  Shortness of breath [R06.02] Swelling [R60.9] Lymphedema [I89.0] Knee pain [M25.569]   Discharge Diagnosis  Shortness of breath [R06.02] Swelling [R60.9] Lymphedema [I89.0] Knee pain [M25.569]    Active Problems:   Cellulitis      Past Medical History:  Diagnosis Date  . Aortic atherosclerosis (HCC) 05/2013   Per TEE  . Arrhythmia   . Asthma   . CHF (congestive heart failure) (HCC)   . Colon polyp   . Coronary artery disease    a. Nonobstructive by 2004 cath b. Nonobstructive by 2015 cath in setting of NSTEMI  . GERD (gastroesophageal reflux disease)   . Heart murmur   . Hiatal hernia   . Hyperlipidemia   . Hypertension   . Lymphedema   . Moderate aortic stenosis    a. echo 2013: EF 55%, nl wall motion, mild DD, mild LVH, trace MR, moderate AS with peak gradient of 55 mm Hg; b. TEE 05/2013: EF EF 60-65%, borderline LVH, mild MR/AI, mod AS with valve area 1.0-1.26; c. echo 10/2014: 55-60%, no RWMA, GR1DD, mod AS w/ mean gradient of 28 mm Hg     . Obesity   . Onychomycosis   . Osteoarthritis     Past Surgical History:  Procedure Laterality Date  . CARDIAC CATHETERIZATION  11/2002  . CARDIAC CATHETERIZATION  05/21/2013   showing 30% oLM, 30% D1, 20% mLCx, 30% pRCA; EF 60%, severe AS (mean gradient 28 mmHg, peak 26, area 0.9)  . CHOLECYSTECTOMY  2001  . TRANSESOPHAGEAL ECHOCARDIOGRAM  05/22/2013   Preserved EF, moderate AS (valve area by planimetry between 1.0-1.26 cm sq), moderate aortic arch  and descending aortic atherosclerosis.   . TRANSTHORACIC ECHOCARDIOGRAM  05/19/2013   EF 60-65%, impaired diastolic function, mild LA dilatation, mild MR/AI/TR, mod AS, high normal RVSP (30.4 mmHg)       History of present illness and  Hospital Course:     Kindly see H&P for history of present illness and admission details, please review complete Labs, Consult reports and Test reports for all details in brief  HPI  from the history and physical done on the [place] of admission 84 year old male patient with history of chronic lymphedema of the legs, had stenosis came in because of falls at home, left knee pain, chronic lymphedema of the legs more on the right leg.  Brought by family because he could not stand on his own, swelling is worse in the legs.  He complains of redness in both legs more on the right side and he came.   Hospital Course   Right leg leg cellulitis, improved, patient received IV Spiriva, discharged home with Keflex for 3 days.  Not sure if left leg is really has cellulitis looks like chronic lymphedema on this admission. 2.  Left knee pain due to degenerative arthritis, received intra-articular steroid injection by orthopedic Dr. Rosita Kea.  Seen by physical therapy, therapy requested nursing home placement but patient wants to go home so we will discharge home with home health physical therapy. 3.  Acute on chronic diastolic heart failure with aortic stenosis.  Patient is on metoprolol, Lasix, patient sees Dr.  Gollan as an outpatient, discussed the findings of echocardiogram with patient's daughter over the phone.  Continue Lasix as an outpatient dose.  Follows up with Dr. Wyn Quaker regarding bilateral chronic lymphedema. 4.  Hyperlipidemia: Patient is on statins., History of CAD, cath in 2415 showed nonobstructive coronary artery disease.  Patient is on Toprol, Plavix    Discharge Condition: Stable   Follow UP  Follow-up Information    Glori Luis, MD. Go on 03/22/2018.    Specialty:  Family Medicine Why:  @ 1:45 p.m. Contact information: [address] [place] [address] [phone]        Antonieta Iba, MD. Go on 04/06/2018.   Specialty:  Cardiology Why:  at 2:30 p.m. Contact information: [address] [place] [address] [phone]             Discharge Instructions  and  Discharge Medications      Allergies as of 03/20/2018      Reactions   Tylenol [acetaminophen]    Aspirin Rash, Other (See Comments)   Reaction: Trouble breathing      Medication List    TAKE these medications   acetaminophen 325 MG tablet Commonly known as:  TYLENOL Take 2 tablets (650 mg total) by mouth every 6 (six) hours as needed for mild pain (or Fever >/= 101).   atorvastatin 80 MG tablet Commonly known as:  LIPITOR Take 1 tablet (80 mg total) by mouth daily.   cephALEXin 500 MG capsule Commonly known as:  KEFLEX Take 1 capsule (500 mg total) by mouth every 12 (twelve) hours.   clopidogrel 75 MG tablet Commonly known as:  PLAVIX Take 1 tablet (75 mg total) by mouth daily.   docusate sodium 100 MG capsule Commonly known as:  COLACE Take 2 capsules (200 mg total) by mouth 2 (two) times daily.   ferrous sulfate 325 (65 FE) MG EC tablet Take 1 tablet (325 mg total) by mouth 2 (two) times daily.   furosemide 40 MG tablet Commonly known as:  LASIX Take 1 tablet (40 mg total) by mouth 2 (two) times daily.   hydrocerin Crea Apply 1 application topically 2 (two) times daily. Apply to bilateral LEs and feet twice daily after cleansing with house skin cleanser and gently patting dry. No not apply between toes.   losartan 25 MG tablet Commonly known as:  COZAAR Take 1 tablet (25 mg total) by mouth daily.   metoprolol succinate 25 MG 24 hr tablet Commonly known as:  TOPROL-XL Take 1 tablet (25 mg total) by mouth daily.   polyethylene glycol packet Commonly known as:  MIRALAX / GLYCOLAX Take 17 g by mouth  daily. What changed:    when to take this  reasons to take this   potassium chloride 10 MEQ tablet Commonly known as:  K-DUR Take 1 tablet (10 mEq total) by mouth 2 (two) times daily.         Diet and Activity recommendation: See Discharge Instructions above   Consults obtained - none   Major procedures and Radiology Reports - PLEASE review detailed and final reports for all details, in brief -     US Venous Img Lower Bilateral  Result Date: 03/17/2018 CLINICAL DATA:  Bilateral lower extremity edema for the past 6 months, worse during the past 3 days. Evaluate for DVT. EXAM: BILATERAL LOWER EXTREMITY VENOUS DOPPLER ULTRASOUND TECHNIQUE: Gray-scale sonography with graded compression, as well as color Doppler and duplex ultrasound were performed  to evaluate the lower extremity deep venous systems from the level of the common femoral vein and including the common femoral, femoral, profunda femoral, popliteal and calf veins including the posterior tibial, peroneal and gastrocnemius veins when visible. The superficial great saphenous vein was also interrogated. Spectral Doppler was utilized to evaluate flow at rest and with distal augmentation maneuvers in the common femoral, femoral and popliteal veins. COMPARISON:  None. FINDINGS: RIGHT LOWER EXTREMITY Common Femoral Vein: No evidence of thrombus. Normal compressibility, respiratory phasicity and response to augmentation. Saphenofemoral Junction: No evidence of thrombus. Normal compressibility and flow on color Doppler imaging. Profunda Femoral Vein: No evidence of thrombus. Normal compressibility and flow on color Doppler imaging. Femoral Vein: No evidence of thrombus. Normal compressibility, respiratory phasicity and response to augmentation. Popliteal Vein: No evidence of thrombus. Normal compressibility, respiratory phasicity and response to augmentation. Calf Veins: No evidence of thrombus. Normal compressibility and flow on color  Doppler imaging. Superficial Great Saphenous Vein: No evidence of thrombus. Normal compressibility. Venous Reflux:  None. Other Findings:  None. LEFT LOWER EXTREMITY Common Femoral Vein: No evidence of thrombus. Normal compressibility, respiratory phasicity and response to augmentation. Saphenofemoral Junction: No evidence of thrombus. Normal compressibility and flow on color Doppler imaging. Profunda Femoral Vein: No evidence of thrombus. Normal compressibility and flow on color Doppler imaging. Femoral Vein: No evidence of thrombus. Normal compressibility, respiratory phasicity and response to augmentation. Popliteal Vein: No evidence of thrombus. Normal compressibility, respiratory phasicity and response to augmentation. Calf Veins: No evidence of thrombus. Normal compressibility and flow on color Doppler imaging. Superficial Great Saphenous Vein: No evidence of thrombus. Normal compressibility. Venous Reflux:  None. Other Findings:  None. IMPRESSION: No evidence of DVT within either lower extremity. Electronically Signed   By: Simonne Come M.D.   On: 03/17/2018 15:23   Dg Chest Port 1 View  Result Date: 03/17/2018 CLINICAL DATA:  Admit today. Per chart: Lower extremity cellulitis, lower extremity edema and anasarca, left knee pain, acute on chronic diastolic congestive heart failure with aortic stenosis, hyperlipidemia, history of CAD. EXAM: PORTABLE CHEST 1 VIEW COMPARISON:  09/26/2017 FINDINGS: Heart size is accentuated by portable technique. There is patchy opacity at the LEFT lung base partially obscuring the hemidiaphragm consistent with atelectasis or early infiltrate. There is atherosclerotic calcification of the thoracic aorta. IMPRESSION: 1. Question developing LEFT LOWER lobe infiltrate versus atelectasis. 2.  Aortic atherosclerosis.  (ICD10-I70.0) Electronically Signed   By: Norva Pavlov M.D.   On: 03/17/2018 13:30   Dg Knee 3 Views Left  Result Date: 03/17/2018 CLINICAL DATA:  Swollen left  knee.  No injury. EXAM: LEFT KNEE - 3 VIEW COMPARISON:  No prior. FINDINGS: Diffuse osteopenia and degenerative change. No acute bony abnormality identified. No evidence of fracture dislocation. Or vascular calcification. IMPRESSION: 1. Diffuse osteopenia and degenerative change. No acute bony abnormality identified. No evidence of fracture. 2.  Peripheral vascular disease. Electronically Signed   By: Maisie Fus  Register   On: 03/17/2018 13:30    Micro Results     Recent Results (from the past 240 hour(s))  MRSA PCR Screening     Status: None   Collection Time: 03/17/18  4:14 PM  Result Value Ref Range Status   MRSA by PCR NEGATIVE NEGATIVE Final    Comment:        The GeneXpert MRSA Assay (FDA approved for NASAL specimens only), is one component of a comprehensive MRSA colonization surveillance program. It is not intended to diagnose MRSA infection nor to guide  or monitor treatment for MRSA infections. Performed at [facility], [address] [place], [place] [address]        Today   Subjective:   Bryceson Kloster today is better, wants to go home. Spoke to  Patient's daughter.   Objective:   Blood pressure (!) 112/51, pulse 66, temperature 98.3 F (36.8 C), temperature source Oral, resp. rate 20, height 5\' 11"  (1.803 m), weight 120.1 kg, SpO2 96 %.  No intake or output data in the 24 hours ending 03/22/18 1418  Exam Awake Alert, Oriented x 3, No new F.N deficits, Normal affect [place].AT,PERRAL Supple Neck,No JVD, No cervical lymphadenopathy appriciated.  Symmetrical Chest wall movement, Good air movement bilaterally, CTAB RRR,No Gallops,Rubs or new Murmurs, No Parasternal Heave +ve B.Sounds, Abd Soft, Non tender, No organomegaly appriciated, No rebound -guarding or rigidity. No Cyanosis, Clubbing or edema, No new Rash or bruise  Data Review   CBC w Diff:  Lab Results  Component Value Date   WBC 6.4 03/18/2018   HGB 10.9 (L) 03/18/2018   HGB 13.6 05/26/2014    HCT 33.9 (L) 03/18/2018   HCT 42.0 05/26/2014   PLT 174 03/18/2018   PLT 160 05/26/2014   LYMPHOPCT 21 09/29/2017   MONOPCT 8 09/29/2017   EOSPCT 2 09/29/2017   BASOPCT 0 09/29/2017    CMP:  Lab Results  Component Value Date   NA 143 03/18/2018   NA 141 05/26/2014   K 3.3 (L) 03/18/2018   K 3.6 05/26/2014   CL 105 03/18/2018   CL 106 05/26/2014   CO2 30 03/18/2018   CO2 27 05/26/2014   BUN 21 03/18/2018   BUN 16 05/26/2014   CREATININE 1.01 03/18/2018   CREATININE 0.72 12/14/2017   PROT 6.5 12/07/2017   PROT 6.8 05/26/2014   ALBUMIN 3.4 (L) 12/07/2017   ALBUMIN 3.3 (L) 05/26/2014   BILITOT 0.9 01/03/2018   BILITOT 1.2 (H) 05/26/2014   ALKPHOS 73 12/07/2017   ALKPHOS 75 05/26/2014   AST 13 12/07/2017   AST 22 05/26/2014   ALT 10 12/07/2017   ALT 17 05/26/2014  .   Total Time in preparing paper work, data evaluation and todays exam - 35 minutes  Katha Hamming M.D on 03/20/2018 at 2:18 PM    Note: This dictation was prepared with Dragon dictation along with smaller phrase technology. Any transcriptional errors that result from this process are unintentional.

## 2018-03-23 DIAGNOSIS — I739 Peripheral vascular disease, unspecified: Secondary | ICD-10-CM | POA: Diagnosis not present

## 2018-03-23 DIAGNOSIS — I350 Nonrheumatic aortic (valve) stenosis: Secondary | ICD-10-CM | POA: Diagnosis not present

## 2018-03-23 DIAGNOSIS — I252 Old myocardial infarction: Secondary | ICD-10-CM | POA: Diagnosis not present

## 2018-03-23 DIAGNOSIS — L03115 Cellulitis of right lower limb: Secondary | ICD-10-CM | POA: Diagnosis not present

## 2018-03-23 DIAGNOSIS — Z9181 History of falling: Secondary | ICD-10-CM | POA: Diagnosis not present

## 2018-03-23 DIAGNOSIS — I890 Lymphedema, not elsewhere classified: Secondary | ICD-10-CM | POA: Diagnosis not present

## 2018-03-23 DIAGNOSIS — Z79899 Other long term (current) drug therapy: Secondary | ICD-10-CM | POA: Diagnosis not present

## 2018-03-23 DIAGNOSIS — Z87891 Personal history of nicotine dependence: Secondary | ICD-10-CM | POA: Diagnosis not present

## 2018-03-23 DIAGNOSIS — I2510 Atherosclerotic heart disease of native coronary artery without angina pectoris: Secondary | ICD-10-CM | POA: Diagnosis not present

## 2018-03-23 DIAGNOSIS — I5033 Acute on chronic diastolic (congestive) heart failure: Secondary | ICD-10-CM | POA: Diagnosis not present

## 2018-03-23 DIAGNOSIS — E785 Hyperlipidemia, unspecified: Secondary | ICD-10-CM | POA: Diagnosis not present

## 2018-03-23 DIAGNOSIS — Z792 Long term (current) use of antibiotics: Secondary | ICD-10-CM | POA: Diagnosis not present

## 2018-03-23 DIAGNOSIS — Z7902 Long term (current) use of antithrombotics/antiplatelets: Secondary | ICD-10-CM | POA: Diagnosis not present

## 2018-03-23 DIAGNOSIS — I110 Hypertensive heart disease with heart failure: Secondary | ICD-10-CM | POA: Diagnosis not present

## 2018-03-23 DIAGNOSIS — E669 Obesity, unspecified: Secondary | ICD-10-CM | POA: Diagnosis not present

## 2018-03-23 DIAGNOSIS — M1712 Unilateral primary osteoarthritis, left knee: Secondary | ICD-10-CM | POA: Diagnosis not present

## 2018-03-24 ENCOUNTER — Ambulatory Visit (INDEPENDENT_AMBULATORY_CARE_PROVIDER_SITE_OTHER): Payer: Medicare Other | Admitting: Family Medicine

## 2018-03-24 ENCOUNTER — Encounter: Payer: Self-pay | Admitting: Family Medicine

## 2018-03-24 ENCOUNTER — Other Ambulatory Visit: Payer: Self-pay

## 2018-03-24 ENCOUNTER — Telehealth: Payer: Self-pay | Admitting: Family Medicine

## 2018-03-24 VITALS — BP 122/50 | HR 73 | Temp 98.5°F | Ht 71.0 in

## 2018-03-24 DIAGNOSIS — I10 Essential (primary) hypertension: Secondary | ICD-10-CM

## 2018-03-24 DIAGNOSIS — D509 Iron deficiency anemia, unspecified: Secondary | ICD-10-CM | POA: Diagnosis not present

## 2018-03-24 DIAGNOSIS — E876 Hypokalemia: Secondary | ICD-10-CM | POA: Diagnosis not present

## 2018-03-24 DIAGNOSIS — D649 Anemia, unspecified: Secondary | ICD-10-CM

## 2018-03-24 DIAGNOSIS — I890 Lymphedema, not elsewhere classified: Secondary | ICD-10-CM | POA: Diagnosis not present

## 2018-03-24 DIAGNOSIS — M1991 Primary osteoarthritis, unspecified site: Secondary | ICD-10-CM

## 2018-03-24 DIAGNOSIS — I2510 Atherosclerotic heart disease of native coronary artery without angina pectoris: Secondary | ICD-10-CM | POA: Diagnosis not present

## 2018-03-24 DIAGNOSIS — M1712 Unilateral primary osteoarthritis, left knee: Secondary | ICD-10-CM | POA: Diagnosis not present

## 2018-03-24 DIAGNOSIS — I110 Hypertensive heart disease with heart failure: Secondary | ICD-10-CM | POA: Diagnosis not present

## 2018-03-24 DIAGNOSIS — I5032 Chronic diastolic (congestive) heart failure: Secondary | ICD-10-CM

## 2018-03-24 DIAGNOSIS — L03119 Cellulitis of unspecified part of limb: Secondary | ICD-10-CM

## 2018-03-24 DIAGNOSIS — Z23 Encounter for immunization: Secondary | ICD-10-CM

## 2018-03-24 DIAGNOSIS — L03115 Cellulitis of right lower limb: Secondary | ICD-10-CM | POA: Diagnosis not present

## 2018-03-24 DIAGNOSIS — I5033 Acute on chronic diastolic (congestive) heart failure: Secondary | ICD-10-CM | POA: Diagnosis not present

## 2018-03-24 NOTE — Telephone Encounter (Signed)
Copied from CRM [id number]. Topic: Quick Communication - Home Health Verbal Orders >> Mar 24, 2018  4:04 PM Valora Piccolo wrote: Caller/Agency: Celine Mans King/[facility] Callback Number: [phone] Requesting OT/PT/Skilled Nursing/Social Work: Skilled Nurse Frequency: 1x a week for 4 weeks, 1 as need visit,  Home health aide 2x a week for 3 weeks.

## 2018-03-24 NOTE — Patient Outreach (Signed)
[facility]) Care Management  03/24/2018  [name] August 17, 1930 [id number]  EMMI: general discharge red alert Referral date: 03/24/18 Referral reason: got discharge papers: no,  Know who to call about changes in condition: no,  Transportation to follow up appointment: no Insurance: Medicare [place] # 1  Telephone call to patient regarding EMMI general discharge red alert. HIPAA verified. Explained reason for call.  Patient requested RNCM speak with his daughter, [name] regarding his health information. Daughter states patient received his discharge paper work when released from the hospital. Daughter states they know who to call about changes in patients condition. Daughter states patient has transportation to his appointments. Daughter states  Patient is scheduled to see his primary MD on today and has a follow up appointment with his cardiologist this month. Daughter states patient has his medications and takes it as prescribed. Daughter states patient completed his antibiotics on yesterday.  Daughter states patient is receiving home health services for nursing, social work, and physical therapy.  Daughter states physical therapy has not started yet. She states patient has received visits from nurse and Child psychotherapist.  Daughter states the swelling in patients right leg has gone down. She states patient is still having some weakness in his legs.  Daughter denies any further concerns / needs for patient.  RNCM provided daughter 24 hour nurse call line number.  RNCM advised patient to notify MD of any changes in condition prior to scheduled appointment. RNCM provided contact name and number: [phone] or  RNCM verified patient aware of 911 services for urgent/ emergent needs.,  PLAN:  RNCM will close patient due to patient being assessed and having no further needs.  RNCM will send outreach letter to patient RNCm will send closure letter to primary md  Jeffery Ina  RN,BSN,CCM [facility] Telephonic  [phone]    PLAN:

## 2018-03-24 NOTE — Patient Instructions (Signed)
Nice to see you. I am glad you are feeling better. Please keep your appointment with cardiology and vascular surgery. We will have you return for lab work. If your legs start to swell worse please let us know. If you develop shortness of breath or pain please be reevaluated.

## 2018-03-24 NOTE — Telephone Encounter (Signed)
I called [facility] & spoke with Sonja to give verbal orders.

## 2018-03-25 NOTE — Assessment & Plan Note (Signed)
Recheck CBC. 

## 2018-03-25 NOTE — Progress Notes (Signed)
Marikay Alar, MD Phone: [phone]  [name] is a 84 y.o. male who presents today for f/u.  CC: hospital follow-up  Patient was hospitalized from 03/16/2018- 03/22/2018 for shortness of breath, lower extremity swelling, and left knee pain.  He presented to the hospital after falling due to left knee pain and not being able to get up on his own.  He had a chronic lymphedema that potentially had overlying cellulitis.  He was treated with IV antibiotics and discharged on Keflex which he finished yesterday.  He received an injection in his left knee which was significantly beneficial.  Physical therapy recommended nursing home placement though the patient declined this and was discharged home with home health.  Physical therapy is going to start coming to his house next week.  He was given Lasix to help with his lower extremity edema which was beneficial.  He notes his legs are much less swollen than they had been.  He notes no fevers.  His shortness of breath has resolved.  He still has difficulty getting up to stand and walk related to feeling weak in his lower extremities.  He notes no weakness elsewhere.  He notes no orthopnea.  He has follow-up with cardiology later this month.  He was also advised to see vascular surgery.  Noted to be mildly anemic near his baseline in the hospital.  Overall the patient is improved.  Discharge summary reviewed.  Medications reviewed.  Social History   Tobacco Use  Smoking Status Former Smoker  . Packs/[place]: 1.50  . Years: 20.00  . Pack years: 30.00  . Types: Cigarettes  . Last attempt to quit: 02/22/1981  . Years since quitting: 37.1  Smokeless Tobacco Never Used     ROS see history of present illness  Objective  Physical Exam Vitals:   03/24/18 1646  BP: (!) 122/50  Pulse: 73  Temp: 98.5 F (36.9 C)  SpO2: 98%    BP Readings from Last 3 Encounters:  03/24/18 (!) 122/50  03/20/18 (!) 112/51  03/16/18 (!) 144/70   Wt Readings from  Last 3 Encounters:  03/17/18 264 lb 11.2 oz (120.1 kg)  03/16/18 273 lb (123.8 kg)  01/05/18 265 lb 8 oz (120.4 kg)    Physical Exam  Constitutional: No distress.  Cardiovascular: Normal rate, regular rhythm and normal heart sounds.  Pulmonary/Chest: Effort normal and breath sounds normal.  Musculoskeletal:  1+ pitting edema bilateral lower extremities right greater than left with chronic venous insufficiency changes  Neurological: He is alert.  5/5 strength in bilateral biceps, triceps, grip, quads, hamstrings, hip flexors, plantar and dorsiflexion, sensation to light touch intact in bilateral UE and LE  Skin: Skin is warm and dry. He is not diaphoretic.     Assessment/Plan: Please see individual problem list.  Cellulitis Findings of infection in his lower extremities today.  He is completed antibiotics.  He will monitor for recurrence.  Osteoarthritis Patient has done well after an injection in his left knee.  He will monitor.  He will complete physical therapy as well.  Chronic diastolic heart failure (HCC) Asymptomatic currently.  He will see cardiology as planned.  He will continue his current regimen.  We will check a BMP.  Iron deficiency anemia Recheck CBC.  Lymphedema Refer to vascular surgery.  Hypertension Diastolic BP slightly low.  Patient denies any lightheadedness.  It has been similarly low on discharge from the hospital.  We will have him follow-up with cardiology.  He will start checking his  blood pressure at home.  If it continues to run at this level he will let us know.   Orders Placed This Encounter  Procedures  . Flu vaccine HIGH DOSE PF (Fluzone High dose)  . Basic Metabolic Panel (BMET)    Standing Status:   Future    Standing Expiration Date:   03/25/2019  . CBC    Standing Status:   Future    Standing Expiration Date:   03/25/2019  . Ambulatory referral to Vascular Surgery    Referral Priority:   Routine    Referral Type:   Surgical     Referral Reason:   Specialty Services Required    Requested Specialty:   Vascular Surgery    Number of Visits Requested:   1    No orders of the defined types were placed in this encounter.    Marikay Alar, MD [facility]

## 2018-03-25 NOTE — Assessment & Plan Note (Signed)
Patient has done well after an injection in his left knee.  He will monitor.  He will complete physical therapy as well.

## 2018-03-25 NOTE — Assessment & Plan Note (Signed)
Diastolic BP slightly low.  Patient denies any lightheadedness.  It has been similarly low on discharge from the hospital.  We will have him follow-up with cardiology.  He will start checking his blood pressure at home.  If it continues to run at this level he will let us know.

## 2018-03-25 NOTE — Assessment & Plan Note (Signed)
Refer to vascular surgery. 

## 2018-03-25 NOTE — Assessment & Plan Note (Signed)
Asymptomatic currently.  He will see cardiology as planned.  He will continue his current regimen.  We will check a BMP.

## 2018-03-25 NOTE — Assessment & Plan Note (Signed)
Findings of infection in his lower extremities today.  He is completed antibiotics.  He will monitor for recurrence.

## 2018-03-27 ENCOUNTER — Telehealth: Payer: Self-pay | Admitting: Family Medicine

## 2018-03-27 ENCOUNTER — Other Ambulatory Visit (INDEPENDENT_AMBULATORY_CARE_PROVIDER_SITE_OTHER): Payer: Medicare Other

## 2018-03-27 DIAGNOSIS — M1712 Unilateral primary osteoarthritis, left knee: Secondary | ICD-10-CM | POA: Diagnosis not present

## 2018-03-27 DIAGNOSIS — L03115 Cellulitis of right lower limb: Secondary | ICD-10-CM | POA: Diagnosis not present

## 2018-03-27 DIAGNOSIS — I110 Hypertensive heart disease with heart failure: Secondary | ICD-10-CM | POA: Diagnosis not present

## 2018-03-27 DIAGNOSIS — D649 Anemia, unspecified: Secondary | ICD-10-CM | POA: Diagnosis not present

## 2018-03-27 DIAGNOSIS — E876 Hypokalemia: Secondary | ICD-10-CM

## 2018-03-27 DIAGNOSIS — I5033 Acute on chronic diastolic (congestive) heart failure: Secondary | ICD-10-CM | POA: Diagnosis not present

## 2018-03-27 DIAGNOSIS — I890 Lymphedema, not elsewhere classified: Secondary | ICD-10-CM | POA: Diagnosis not present

## 2018-03-27 DIAGNOSIS — I2510 Atherosclerotic heart disease of native coronary artery without angina pectoris: Secondary | ICD-10-CM | POA: Diagnosis not present

## 2018-03-27 LAB — BASIC METABOLIC PANEL
BUN: 21 mg/dL (ref 6–23)
CHLORIDE: 104 meq/L (ref 96–112)
CO2: 29 mEq/L (ref 19–32)
Calcium: 9.1 mg/dL (ref 8.4–10.5)
Creatinine, Ser: 0.9 mg/dL (ref 0.40–1.50)
GFR: 103.07 mL/min (ref >60.00)
Glucose, Bld: 89 mg/dL (ref 70–99)
POTASSIUM: 3.8 meq/L (ref 3.5–5.1)
SODIUM: 140 meq/L (ref 135–145)

## 2018-03-27 LAB — CBC
HCT: 37.4 % — ABNORMAL LOW (ref 39.0–52.0)
Hemoglobin: 12.7 g/dL — ABNORMAL LOW (ref 13.0–17.0)
MCHC: 33.9 g/dL (ref 30.0–36.0)
MCV: 80.9 fl (ref 78.0–100.0)
Platelets: 227 10*3/uL (ref 150.0–400.0)
RBC: 4.63 Mil/uL (ref 4.22–5.81)
RDW: 14.2 % (ref 11.5–15.5)
WBC: 6.9 10*3/uL (ref 4.0–10.5)

## 2018-03-27 NOTE — Telephone Encounter (Signed)
I called and verbal orders to [place] with [facility].

## 2018-03-27 NOTE — Telephone Encounter (Unsigned)
Copied from CRM [id number]. Topic: Quick Communication - Home Health Verbal Orders >> Mar 27, 2018  3:42 PM Percival Spanish wrote: [name] with [facility]   Callback Number  [phone]   Requesting  VERBAL    OT   Frequency   2 X 2

## 2018-03-29 ENCOUNTER — Telehealth: Payer: Self-pay | Admitting: Family Medicine

## 2018-03-29 DIAGNOSIS — I110 Hypertensive heart disease with heart failure: Secondary | ICD-10-CM | POA: Diagnosis not present

## 2018-03-29 DIAGNOSIS — I5033 Acute on chronic diastolic (congestive) heart failure: Secondary | ICD-10-CM | POA: Diagnosis not present

## 2018-03-29 DIAGNOSIS — M1712 Unilateral primary osteoarthritis, left knee: Secondary | ICD-10-CM | POA: Diagnosis not present

## 2018-03-29 DIAGNOSIS — I2510 Atherosclerotic heart disease of native coronary artery without angina pectoris: Secondary | ICD-10-CM | POA: Diagnosis not present

## 2018-03-29 DIAGNOSIS — L03115 Cellulitis of right lower limb: Secondary | ICD-10-CM | POA: Diagnosis not present

## 2018-03-29 DIAGNOSIS — I890 Lymphedema, not elsewhere classified: Secondary | ICD-10-CM | POA: Diagnosis not present

## 2018-03-29 NOTE — Telephone Encounter (Signed)
[facility] to give verbal orders for patient.

## 2018-03-29 NOTE — Telephone Encounter (Signed)
Copied from CRM [id number]. Topic: Quick Communication - Home Health Verbal Orders >> Mar 29, 2018  4:22 PM Lorayne BenderAlexander, Amber L wrote: Caller/Agency: Larita FifeLynn with [facility] Callback Number: [phone], OK to leave a message Requesting OT/PT/Skilled Nursing/Social Work: PT Frequency: 1x a week for 1 week and then 2x week for 4 weeks

## 2018-03-30 DIAGNOSIS — I5033 Acute on chronic diastolic (congestive) heart failure: Secondary | ICD-10-CM | POA: Diagnosis not present

## 2018-03-30 DIAGNOSIS — L03115 Cellulitis of right lower limb: Secondary | ICD-10-CM | POA: Diagnosis not present

## 2018-03-30 DIAGNOSIS — I110 Hypertensive heart disease with heart failure: Secondary | ICD-10-CM | POA: Diagnosis not present

## 2018-03-30 DIAGNOSIS — I890 Lymphedema, not elsewhere classified: Secondary | ICD-10-CM | POA: Diagnosis not present

## 2018-03-30 DIAGNOSIS — I2510 Atherosclerotic heart disease of native coronary artery without angina pectoris: Secondary | ICD-10-CM | POA: Diagnosis not present

## 2018-03-30 DIAGNOSIS — M1712 Unilateral primary osteoarthritis, left knee: Secondary | ICD-10-CM | POA: Diagnosis not present

## 2018-04-03 ENCOUNTER — Ambulatory Visit (INDEPENDENT_AMBULATORY_CARE_PROVIDER_SITE_OTHER): Payer: Medicare Other | Admitting: Vascular Surgery

## 2018-04-03 ENCOUNTER — Encounter (INDEPENDENT_AMBULATORY_CARE_PROVIDER_SITE_OTHER): Payer: Self-pay | Admitting: Vascular Surgery

## 2018-04-03 VITALS — BP 129/64 | HR 66 | Resp 16 | Ht 71.0 in

## 2018-04-03 DIAGNOSIS — E1159 Type 2 diabetes mellitus with other circulatory complications: Secondary | ICD-10-CM

## 2018-04-03 DIAGNOSIS — Z87891 Personal history of nicotine dependence: Secondary | ICD-10-CM | POA: Diagnosis not present

## 2018-04-03 DIAGNOSIS — I10 Essential (primary) hypertension: Secondary | ICD-10-CM | POA: Diagnosis not present

## 2018-04-03 DIAGNOSIS — I890 Lymphedema, not elsewhere classified: Secondary | ICD-10-CM

## 2018-04-03 DIAGNOSIS — I2510 Atherosclerotic heart disease of native coronary artery without angina pectoris: Secondary | ICD-10-CM | POA: Diagnosis not present

## 2018-04-03 DIAGNOSIS — I872 Venous insufficiency (chronic) (peripheral): Secondary | ICD-10-CM

## 2018-04-03 NOTE — Progress Notes (Signed)
MRN : [id number]  [name] is a 84 y.o. (Jan 15, 1931) male who presents with chief complaint of  Chief Complaint  Patient presents with  . Follow-up    ref [facility] for lymphedema  .  History of Present Illness:   The patient returns to the office for followup evaluation regarding leg swelling.  The swelling has persisted but with the lymph pump is much, much better controlled. The pain associated with swelling is essentially eliminated. There have not been any interval development of a ulcerations or wounds.  The patient denies problems with the pump, noting it is working well and the leggings are in good condition.  Since the previous visit the patient has been wearing graduated compression stockings and using the lymph pump on a routine basis and  has noted significant improvement in the lymphedema.   Patient stated the lymph pump has been a very positive factor in her care.    Current Meds  Medication Sig  . atorvastatin (LIPITOR) 80 MG tablet Take 1 tablet (80 mg total) by mouth daily.  . clopidogrel (PLAVIX) 75 MG tablet Take 1 tablet (75 mg total) by mouth daily.  . ferrous sulfate 325 (65 FE) MG EC tablet Take 1 tablet (325 mg total) by mouth 2 (two) times daily.  . furosemide (LASIX) 40 MG tablet Take 1 tablet (40 mg total) by mouth 2 (two) times daily.  . hydrocerin (EUCERIN) CREA Apply 1 application topically 2 (two) times daily. Apply to bilateral LEs and feet twice daily after cleansing with house skin cleanser and gently patting dry. No not apply between toes.  . metoprolol succinate (TOPROL-XL) 25 MG 24 hr tablet Take 1 tablet (25 mg total) by mouth daily.  . potassium chloride (K-DUR) 10 MEQ tablet Take 1 tablet (10 mEq total) by mouth 2 (two) times daily.    Past Medical History:  Diagnosis Date  . Aortic atherosclerosis (HCC) 05/2013   Per TEE  . Arrhythmia   . Asthma   . CHF (congestive heart failure) (HCC)   . Colon polyp   . Coronary artery disease      a. Nonobstructive by 2004 cath b. Nonobstructive by 2015 cath in setting of NSTEMI  . GERD (gastroesophageal reflux disease)   . Heart murmur   . Hiatal hernia   . Hyperlipidemia   . Hypertension   . Lymphedema   . Moderate aortic stenosis    a. echo 2013: EF 55%, nl wall motion, mild DD, mild LVH, trace MR, moderate AS with peak gradient of 55 mm Hg; b. TEE 05/2013: EF EF 60-65%, borderline LVH, mild MR/AI, mod AS with valve area 1.0-1.26; c. echo 10/2014: 55-60%, no RWMA, GR1DD, mod AS w/ mean gradient of 28 mm Hg     . Obesity   . Onychomycosis   . Osteoarthritis     Past Surgical History:  Procedure Laterality Date  . CARDIAC CATHETERIZATION  11/2002  . CARDIAC CATHETERIZATION  05/21/2013   showing 30% oLM, 30% D1, 20% mLCx, 30% pRCA; EF 60%, severe AS (mean gradient 28 mmHg, peak 26, area 0.9)  . CHOLECYSTECTOMY  2001  . TRANSESOPHAGEAL ECHOCARDIOGRAM  05/22/2013   Preserved EF, moderate AS (valve area by planimetry between 1.0-1.26 cm sq), moderate aortic arch and descending aortic atherosclerosis.   . TRANSTHORACIC ECHOCARDIOGRAM  05/19/2013   EF 60-65%, impaired diastolic function, mild LA dilatation, mild MR/AI/TR, mod AS, high normal RVSP (30.4 mmHg)    Social History Social History   Tobacco  Use  . Smoking status: Former Smoker    Packs/Kissinger: 1.50    Years: 20.00    Pack years: 30.00    Types: Cigarettes    Last attempt to quit: 02/22/1981    Years since quitting: 37.1  . Smokeless tobacco: Never Used  Substance Use Topics  . Alcohol use: No  . Drug use: No    Family History Family History  Problem Relation Age of Onset  . Heart disease Mother   . Heart disease Father 53  . Stroke Brother     Allergies  Allergen Reactions  . Aspirin Rash and Other (See Comments)    Reaction: Trouble breathing      REVIEW OF SYSTEMS (Negative unless checked)  Constitutional: [] Weight loss  [] Fever  [] Chills Cardiac: [] Chest pain   [] Chest pressure   [] Palpitations    [] Shortness of breath when laying flat   [] Shortness of breath with exertion. Vascular:  [] Pain in legs with walking   [] Pain in legs at rest  [] History of DVT   [] Phlebitis   [x] Swelling in legs   [] Varicose veins   [] Non-healing ulcers Pulmonary:   [] Uses home oxygen   [] Productive cough   [] Hemoptysis   [] Wheeze  [] COPD   [] Asthma Neurologic:  [] Dizziness   [] Seizures   [] History of stroke   [] History of TIA  [] Aphasia   [] Vissual changes   [] Weakness or numbness in arm   [] Weakness or numbness in leg Musculoskeletal:   [] Joint swelling   [] Joint pain   [] Low back pain Hematologic:  [] Easy bruising  [] Easy bleeding   [] Hypercoagulable state   [] Anemic Gastrointestinal:  [] Diarrhea   [] Vomiting  [] Gastroesophageal reflux/heartburn   [] Difficulty swallowing. Genitourinary:  [] Chronic kidney disease   [] Difficult urination  [] Frequent urination   [] Blood in urine Skin:  [] Rashes   [] Ulcers  Psychological:  [] History of anxiety   []  History of major depression.  Physical Examination  Vitals:   04/03/18 1039  BP: 129/64  Pulse: 66  Resp: 16  Height: 5\' 11"  (1.803 m)   Body mass index is 36.92 kg/m. Gen: WD/WN, NAD Head: [place]/AT, No temporalis wasting.  Ear/Nose/Throat: Hearing grossly intact, nares w/o erythema or drainage Eyes: PER, EOMI, sclera nonicteric.  Neck: Supple, no large masses.   Pulmonary:  Good air movement, no audible wheezing bilaterally, no use of accessory muscles.  Cardiac: RRR, no JVD Vascular: scattered varicosities present bilaterally.  Mild venous stasis changes to the legs bilaterally.  2-3+ soft pitting edema right > left Vessel Right Left  Radial Palpable Palpable  Gastrointestinal: Non-distended. No guarding/no peritoneal signs.  Musculoskeletal: M/S 5/5 throughout.  No deformity or atrophy.  Neurologic: CN 2-12 intact. Symmetrical.  Speech is fluent. Motor exam as listed above. Psychiatric: Judgment intact, Mood & affect appropriate for pt's clinical  situation. Dermatologic: severe venous rashes no ulcers noted.  No changes consistent with cellulitis. Lymph : No lichenification or skin changes of chronic lymphedema.  CBC Lab Results  Component Value Date   WBC 6.9 03/27/2018   HGB 12.7 (L) 03/27/2018   HCT 37.4 (L) 03/27/2018   MCV 80.9 03/27/2018   PLT 227.0 03/27/2018    BMET    Component Value Date/Time   NA 140 03/27/2018 1434   NA 141 05/26/2014 1101   K 3.8 03/27/2018 1434   K 3.6 05/26/2014 1101   CL 104 03/27/2018 1434   CL 106 05/26/2014 1101   CO2 29 03/27/2018 1434   CO2 27 05/26/2014 1101  GLUCOSE 89 03/27/2018 1434   GLUCOSE 100 (H) 05/26/2014 1101   BUN 21 03/27/2018 1434   BUN 16 05/26/2014 1101   CREATININE 0.90 03/27/2018 1434   CREATININE 0.72 12/14/2017 1355   CALCIUM 9.1 03/27/2018 1434   CALCIUM 8.8 05/26/2014 1101   GFRNONAA >60 03/18/2018 0420   GFRNONAA >60 05/26/2014 1101   GFRNONAA >60 05/21/2013 0403   GFRAA >60 03/18/2018 0420   GFRAA >60 05/26/2014 1101   GFRAA >60 05/21/2013 0403   CrCl cannot be calculated (Unknown ideal weight.).  COAG Lab Results  Component Value Date   INR 1.1 05/18/2013    Radiology US Venous Img Lower Bilateral  Result Date: 03/17/2018 CLINICAL DATA:  Bilateral lower extremity edema for the past 6 months, worse during the past 3 days. Evaluate for DVT. EXAM: BILATERAL LOWER EXTREMITY VENOUS DOPPLER ULTRASOUND TECHNIQUE: Gray-scale sonography with graded compression, as well as color Doppler and duplex ultrasound were performed to evaluate the lower extremity deep venous systems from the level of the common femoral vein and including the common femoral, femoral, profunda femoral, popliteal and calf veins including the posterior tibial, peroneal and gastrocnemius veins when visible. The superficial great saphenous vein was also interrogated. Spectral Doppler was utilized to evaluate flow at rest and with distal augmentation maneuvers in the common femoral,  femoral and popliteal veins. COMPARISON:  None. FINDINGS: RIGHT LOWER EXTREMITY Common Femoral Vein: No evidence of thrombus. Normal compressibility, respiratory phasicity and response to augmentation. Saphenofemoral Junction: No evidence of thrombus. Normal compressibility and flow on color Doppler imaging. Profunda Femoral Vein: No evidence of thrombus. Normal compressibility and flow on color Doppler imaging. Femoral Vein: No evidence of thrombus. Normal compressibility, respiratory phasicity and response to augmentation. Popliteal Vein: No evidence of thrombus. Normal compressibility, respiratory phasicity and response to augmentation. Calf Veins: No evidence of thrombus. Normal compressibility and flow on color Doppler imaging. Superficial Great Saphenous Vein: No evidence of thrombus. Normal compressibility. Venous Reflux:  None. Other Findings:  None. LEFT LOWER EXTREMITY Common Femoral Vein: No evidence of thrombus. Normal compressibility, respiratory phasicity and response to augmentation. Saphenofemoral Junction: No evidence of thrombus. Normal compressibility and flow on color Doppler imaging. Profunda Femoral Vein: No evidence of thrombus. Normal compressibility and flow on color Doppler imaging. Femoral Vein: No evidence of thrombus. Normal compressibility, respiratory phasicity and response to augmentation. Popliteal Vein: No evidence of thrombus. Normal compressibility, respiratory phasicity and response to augmentation. Calf Veins: No evidence of thrombus. Normal compressibility and flow on color Doppler imaging. Superficial Great Saphenous Vein: No evidence of thrombus. Normal compressibility. Venous Reflux:  None. Other Findings:  None. IMPRESSION: No evidence of DVT within either lower extremity. Electronically Signed   By: Simonne Come M.D.   On: 03/17/2018 15:23   Dg Chest Port 1 View  Result Date: 03/17/2018 CLINICAL DATA:  Admit today. Per chart: Lower extremity cellulitis, lower extremity  edema and anasarca, left knee pain, acute on chronic diastolic congestive heart failure with aortic stenosis, hyperlipidemia, history of CAD. EXAM: PORTABLE CHEST 1 VIEW COMPARISON:  09/26/2017 FINDINGS: Heart size is accentuated by portable technique. There is patchy opacity at the LEFT lung base partially obscuring the hemidiaphragm consistent with atelectasis or early infiltrate. There is atherosclerotic calcification of the thoracic aorta. IMPRESSION: 1. Question developing LEFT LOWER lobe infiltrate versus atelectasis. 2.  Aortic atherosclerosis.  (ICD10-I70.0) Electronically Signed   By: Norva Pavlov M.D.   On: 03/17/2018 13:30   Dg Knee 3 Views Left  Result Date: 03/17/2018  CLINICAL DATA:  Swollen left knee.  No injury. EXAM: LEFT KNEE - 3 VIEW COMPARISON:  No prior. FINDINGS: Diffuse osteopenia and degenerative change. No acute bony abnormality identified. No evidence of fracture dislocation. Or vascular calcification. IMPRESSION: 1. Diffuse osteopenia and degenerative change. No acute bony abnormality identified. No evidence of fracture. 2.  Peripheral vascular disease. Electronically Signed   By: Maisie Fushomas  Register   On: 03/17/2018 13:30     Assessment/Plan 1. Lymphedema  No surgery or intervention at this point in time.    I have reviewed my discussion with the patient regarding lymphedema and why it  causes symptoms.  Patient will continue wearing graduated compression stockings class 1 (20-30 mmHg) on a daily basis a prescription was given. The patient is reminded to put the stockings on first thing in the morning and removing them in the evening. The patient is instructed specifically not to sleep in the stockings.   In addition, behavioral modification throughout the [place] will be continued.  This will include frequent elevation (such as in a recliner), use of over the counter pain medications as needed and exercise such as walking.  I have reviewed systemic causes for chronic edema  such as liver, kidney and cardiac etiologies and there does not appear to be any significant changes in these organ systems over the past year.  The patient is under the impression that these organ systems are all stable and unchanged.    The patient will continue aggressive use of the  lymph pump.  This will continue to improve the edema control and prevent sequela such as ulcers and infections.   The patient will follow-up with me on an annual basis.    2. Chronic venous insufficiency  No surgery or intervention at this point in time.    I have reviewed my discussion with the patient regarding lymphedema and why it  causes symptoms.  Patient will continue wearing graduated compression stockings class 1 (20-30 mmHg) on a daily basis a prescription was given. The patient is reminded to put the stockings on first thing in the morning and removing them in the evening. The patient is instructed specifically not to sleep in the stockings.   In addition, behavioral modification throughout the [place] will be continued.  This will include frequent elevation (such as in a recliner), use of over the counter pain medications as needed and exercise such as walking.  I have reviewed systemic causes for chronic edema such as liver, kidney and cardiac etiologies and there does not appear to be any significant changes in these organ systems over the past year.  The patient is under the impression that these organ systems are all stable and unchanged.    The patient will continue aggressive use of the  lymph pump.  This will continue to improve the edema control and prevent sequela such as ulcers and infections.   The patient will follow-up with me on an annual basis.    3. Essential hypertension Continue antihypertensive medications as already ordered, these medications have been reviewed and there are no changes at this time.   4. Coronary artery disease, non-occlusive Continue cardiac and antihypertensive  medications as already ordered and reviewed, no changes at this time.  Continue statin as ordered and reviewed, no changes at this time  Nitrates PRN for chest pain   5. Type 2 diabetes mellitus with other circulatory complication, without long-term current use of insulin (HCC) Continue hypoglycemic medications as already ordered, these medications have  been reviewed and there are no changes at this time.  Hgb A1C to be monitored as already arranged by primary service     Levora Dredge, MD  04/03/2018 12:52 PM

## 2018-04-04 DIAGNOSIS — I2510 Atherosclerotic heart disease of native coronary artery without angina pectoris: Secondary | ICD-10-CM | POA: Diagnosis not present

## 2018-04-04 DIAGNOSIS — I5033 Acute on chronic diastolic (congestive) heart failure: Secondary | ICD-10-CM | POA: Diagnosis not present

## 2018-04-04 DIAGNOSIS — M1712 Unilateral primary osteoarthritis, left knee: Secondary | ICD-10-CM | POA: Diagnosis not present

## 2018-04-04 DIAGNOSIS — I110 Hypertensive heart disease with heart failure: Secondary | ICD-10-CM | POA: Diagnosis not present

## 2018-04-04 DIAGNOSIS — L03115 Cellulitis of right lower limb: Secondary | ICD-10-CM | POA: Diagnosis not present

## 2018-04-04 DIAGNOSIS — I890 Lymphedema, not elsewhere classified: Secondary | ICD-10-CM | POA: Diagnosis not present

## 2018-04-05 DIAGNOSIS — M1712 Unilateral primary osteoarthritis, left knee: Secondary | ICD-10-CM | POA: Diagnosis not present

## 2018-04-05 DIAGNOSIS — I5033 Acute on chronic diastolic (congestive) heart failure: Secondary | ICD-10-CM | POA: Diagnosis not present

## 2018-04-05 DIAGNOSIS — L03115 Cellulitis of right lower limb: Secondary | ICD-10-CM | POA: Diagnosis not present

## 2018-04-05 DIAGNOSIS — I110 Hypertensive heart disease with heart failure: Secondary | ICD-10-CM | POA: Diagnosis not present

## 2018-04-05 DIAGNOSIS — I2510 Atherosclerotic heart disease of native coronary artery without angina pectoris: Secondary | ICD-10-CM | POA: Diagnosis not present

## 2018-04-05 DIAGNOSIS — I890 Lymphedema, not elsewhere classified: Secondary | ICD-10-CM | POA: Diagnosis not present

## 2018-04-06 ENCOUNTER — Encounter: Payer: Self-pay | Admitting: Nurse Practitioner

## 2018-04-06 ENCOUNTER — Ambulatory Visit (INDEPENDENT_AMBULATORY_CARE_PROVIDER_SITE_OTHER): Payer: Medicare Other | Admitting: Nurse Practitioner

## 2018-04-06 ENCOUNTER — Ambulatory Visit: Payer: Medicare Other

## 2018-04-06 VITALS — BP 134/60 | HR 57 | Ht 71.0 in | Wt 253.5 lb

## 2018-04-06 DIAGNOSIS — E782 Mixed hyperlipidemia: Secondary | ICD-10-CM

## 2018-04-06 DIAGNOSIS — I350 Nonrheumatic aortic (valve) stenosis: Secondary | ICD-10-CM

## 2018-04-06 DIAGNOSIS — I5032 Chronic diastolic (congestive) heart failure: Secondary | ICD-10-CM | POA: Diagnosis not present

## 2018-04-06 DIAGNOSIS — I10 Essential (primary) hypertension: Secondary | ICD-10-CM | POA: Diagnosis not present

## 2018-04-06 DIAGNOSIS — I2510 Atherosclerotic heart disease of native coronary artery without angina pectoris: Secondary | ICD-10-CM

## 2018-04-06 NOTE — Patient Instructions (Signed)
Medication Instructions:  Your physician recommends that you continue on your current medications as directed. Please refer to the Current Medication list given to you today.  If you need a refill on your cardiac medications before your next appointment, please call your pharmacy.   Lab work: Your physician recommends that you return for lab work in: TODAY- BMET.   If you have labs (blood work) drawn today and your tests are completely normal, you will receive your results only by: Marland Kitchen. MyChart Message (if you have MyChart) OR . A paper copy in the mail If you have any lab test that is abnormal or we need to change your treatment, we will call you to review the results.  Testing/Procedures: none  Follow-Up: [facility], you and your health needs are our priority.  As part of our continuing mission to provide you with exceptional heart care, we have created designated Provider Care Teams.  These Care Teams include your primary Cardiologist (physician) and Advanced Practice Providers (APPs -  Physician Assistants and Nurse Practitioners) who all work together to provide you with the care you need, when you need it. You will need a follow up appointment in 2 months.  Please call our office 2 months in advance to schedule this appointment.  You may see Julien Nordmannimothy Gollan, MD or one of the following Advanced Practice Providers on your designated Care Team:   Nicolasa Duckinghristopher Berge, NP Eula Listenyan Dunn, PA-C . Marisue IvanJacquelyn Visser, PA-C

## 2018-04-06 NOTE — Progress Notes (Signed)
Office Visit    Patient Name: [name] Date of Encounter: 04/06/2018  Primary Care Provider:  Glori Luis, MD Primary Cardiologist:  Julien Nordmann, MD  Chief Complaint    84 year old male with a history of nonobstructive CAD, progressive aortic stenosis, HFpEF, hypertension, hyperlipidemia, obesity, GERD, and lymphedema with recurrent lower extremity cellulitis, who presents for follow-up after recent hospitalization.  Past Medical History    Past Medical History:  Diagnosis Date  . (HFpEF) heart failure with preserved ejection fraction (HCC)    a. 03/2018 Echo: EF 60-65%, Gr1 DD.  Marland Kitchen Aortic atherosclerosis (HCC) 05/2013   Per TEE  . Arrhythmia   . Asthma   . Colon polyp   . Coronary artery disease    a. Nonobstructive by 2004 cath b. 05/2013 NSTEMI/Cath: >M 30, LAD 1m, LCX 47m, RCA 30p, EF 60%, ? Sev AS (mod by TEE).  Marland Kitchen GERD (gastroesophageal reflux disease)   . Heart murmur   . Hiatal hernia   . Hyperlipidemia   . Hypertension   . Lymphedema   . Mitral regurgitation    a. 03/2018 Echo: Mild to mod MR.  . Obesity   . Onychomycosis   . Osteoarthritis   . Recurrent cellulitis of lower extremity   . Severe aortic stenosis    a. 2013 Echo: EF 55%, mod AS; b. TEE 05/2013: EF EF 60-65%, mod AS; c.10/2014 Echo: 55-60%, mod AS; d. 10/2016 Echo: EF 65-70%, Mod-Sev AS; e. 03/2018 Ehco: EF 60-65%, Sev AS, mild to mod AI. Mean grad (S) , Valve Area (VTI): 0.92cm^2.   Past Surgical History:  Procedure Laterality Date  . CARDIAC CATHETERIZATION  11/2002  . CARDIAC CATHETERIZATION  05/21/2013   showing 30% oLM, 30% D1, 20% mLCx, 30% pRCA; EF 60%, severe AS (mean gradient 28 mmHg, peak 26, area 0.9)  . CHOLECYSTECTOMY  2001  . TRANSESOPHAGEAL ECHOCARDIOGRAM  05/22/2013   Preserved EF, moderate AS (valve area by planimetry between 1.0-1.26 cm sq), moderate aortic arch and descending aortic atherosclerosis.   . TRANSTHORACIC ECHOCARDIOGRAM  05/19/2013   EF 60-65%, impaired  diastolic function, mild LA dilatation, mild MR/AI/TR, mod AS, high normal RVSP (30.4 mmHg)    Allergies  Allergies  Allergen Reactions  . Aspirin Rash and Other (See Comments)    Reaction: Trouble breathing     History of Present Illness    84 year old male with the above complex past medical history including nonobstructive CAD status post catheterization in January 2015, progressive aortic stenosis-now severe by recent echo, HFpEF, hypertension, hyper lipidemia, obesity, GERD, and lymphedema with recurrent lower extremity cellulitis.  He has been followed closely in the heart failure clinic and unfortunately was recently hospitalized from October 31 to November 4 with lower extremity cellulitis and HFpEF.  He was treated with antibiotics and IV diuretics with reduction in weight from 273 pounds on admission to 264 pounds.  An echocardiogram was performed during his hospitalization and showed an EF of 60 to 65% with severe aortic stenosis.  His mean gradient was 50 mmHg with a valve area of 0.92 cm.  Since discharge, he has been doing reasonably well.  He walks only a limited amount and says that he develops leg weakness, which limits his walking.  He denies claudication.  Further, he denies any chest pain or dyspnea.  His lower extremity swelling has been relatively stable.  He has chronic right greater than left edema.  He has continue to wear compression stockings and use his lymph pump  daily.  He has not been weighing himself at home but his weight is down to 253 pounds on our scale today.  He denies palpitations, PND, orthopnea, dizziness, syncope, or early satiety.  Home Medications    Prior to Admission medications   Medication Sig Start Date End Date Taking? Authorizing Provider  atorvastatin (LIPITOR) 80 MG tablet Take 1 tablet (80 mg total) by mouth daily. 10/19/17  Yes Clarisa KindredHackney, Tina A, FNP  clopidogrel (PLAVIX) 75 MG tablet Take 1 tablet (75 mg total) by mouth daily. 10/19/17  Yes  Clarisa KindredHackney, Tina A, FNP  ferrous sulfate 325 (65 FE) MG EC tablet Take 1 tablet (325 mg total) by mouth 2 (two) times daily. 06/06/17  Yes Glori LuisSonnenberg, Eric G, MD  furosemide (LASIX) 40 MG tablet Take 1 tablet (40 mg total) by mouth 2 (two) times daily. 10/19/17  Yes Hackney, Tina A, FNP  hydrocerin (EUCERIN) CREA Apply 1 application topically 2 (two) times daily. Apply to bilateral LEs and feet twice daily after cleansing with house skin cleanser and gently patting dry. No not apply between toes. 08/10/17  Yes Auburn BilberryPatel, Shreyang, MD  metoprolol succinate (TOPROL-XL) 25 MG 24 hr tablet Take 1 tablet (25 mg total) by mouth daily. 10/19/17  Yes Clarisa KindredHackney, Tina A, FNP  potassium chloride (K-DUR) 10 MEQ tablet Take 1 tablet (10 mEq total) by mouth 2 (two) times daily. 12/12/17  Yes Clarisa KindredHackney, Tina A, FNP  losartan (COZAAR) 25 MG tablet Take 1 tablet (25 mg total) by mouth daily. 12/15/17 03/17/18  Antonieta IbaGollan, Timothy J, MD    Review of Systems    Chronic right greater than left lower extremity swelling which has been stable since recent hospital discharge.  He denies any chest pain or dyspnea but also notes that activity is fairly limited secondary to leg weakness and knee pain.  He denies palpitations, PND, orthopnea, dizziness, syncope, or early satiety.  All other systems reviewed and are otherwise negative except as noted above.  Physical Exam    VS:  BP 134/60 (BP Location: Right Arm, Patient Position: Sitting, Cuff Size: Normal)   Pulse (!) 57   Ht 5\' 11"  (1.803 m)   Wt 253 lb 8 oz (115 kg)   BMI 35.36 kg/m  , BMI Body mass index is 35.36 kg/m. GEN: Obese, in no acute distress. HEENT: normal. Neck: Supple, no JVD, radiated systolic murmur noted.   Cardiac: RRR, 3/6 harsh systolic ejection murmur at the right upper sternal border with 3 or 6 more blowing quality systolic murmur at the apex.  No rubs, or gallops. No clubbing, cyanosis.  2+ + right lower extremity swelling with chronic venous stasis skin changes and  1+ left lower extremity edema.  No significant erythema..  Radials/PT 1+ and equal bilaterally.  Respiratory:  Respirations regular and unlabored, clear to auscultation bilaterally. GI: Obese, soft, nontender, nondistended, BS + x 4. MS: no deformity or atrophy. Skin: warm and dry, no rash. Neuro:  Strength and sensation are intact. Psych: Normal affect.  Accessory Clinical Findings    ECG personally reviewed by me today -sinus bradycardia, 57, right bundle branch block- no acute changes.  Assessment & Plan    1.  Severe aortic stenosis: Patient with a history of aortic stenosis dating back to at least 2005.  This has been slowly progressive over the years.  Recent echocardiogram performed during hospitalization earlier this month in the setting of heart failure and cellulitis, showed severe aortic stenosis with a mean gradient of 50 mmHg  and a valve area of 0.92 cm.  Patient denies any significant chest pain, dyspnea, or presyncope/syncope however, he was volume overloaded during recent admission.  We discussed referral to our valve clinic in [facility] for further exploration of options for management of severe aortic stenosis.  Patient says that he is feeling well at this time he is not interested in referral but will continue to think about it and let us know either at his heart failure follow-up in December when he sees [place] back in January.  He is relatively euvolemic on exam today and his weight is down 11 pounds since his discharge.  I will follow-up a basic metabolic panel today.  No changes to medications.  2.  HFpEF: Recent hospitalization with heart failure and lower extremity cellulitis.  Discharge weight as best as I can tell was 264 pounds.  Weight is 253 today.  He is on Lasix 40 mg twice daily and reports compliance.  He is relatively euvolemic with only mild chronic lower extremity swelling.  I will follow-up a basic metabolic panel today.  His heart rate and blood pressure  reasonably controlled.  Continue beta-blocker and ARB therapy.  3.  Essential hypertension: Relatively stable on beta-blocker, ARB, and diuretic.  He does not check his blood pressure at home.  4.  Lymphedema: Followed by vascular surgery.  He is compliant with graduated compression stockings and lymphedema pump.  Overall, legs look good today.  5.  Lower extremity cellulitis: Resolved.  No significant erythema or pain today.  6.  Hyperlipidemia: LDL was 68 in July.  Normal LFTs at that time.  Continue statin therapy.  7.  Nonobstructive CAD: Status post catheterization in January 2015.  No significant coronary disease at that time.  CT of the chest in December 2017 showed diffuse atherosclerosis of the aorta, great vessels, and coronary arteries.  He denies chest pain or dyspnea but as noted above, activity relatively limited.  He remains on beta-blocker, ARB, statin, and Plavix therapy.  He is allergic to aspirin.  8.  Disposition: Follow-up basic meta Bolick panel today.  He has follow-up in heart failure clinic in a couple of weeks and we will arrange for follow-up in approximately 2 months.  I encouraged he and his brother to consider referral to TAVR clinic for further evaluation of severe aortic stenosis.  Nicolasa Ducking, NP 04/06/2018, 9:03 AM

## 2018-04-07 DIAGNOSIS — I110 Hypertensive heart disease with heart failure: Secondary | ICD-10-CM | POA: Diagnosis not present

## 2018-04-07 DIAGNOSIS — I2510 Atherosclerotic heart disease of native coronary artery without angina pectoris: Secondary | ICD-10-CM | POA: Diagnosis not present

## 2018-04-07 DIAGNOSIS — L03115 Cellulitis of right lower limb: Secondary | ICD-10-CM | POA: Diagnosis not present

## 2018-04-07 DIAGNOSIS — M1712 Unilateral primary osteoarthritis, left knee: Secondary | ICD-10-CM | POA: Diagnosis not present

## 2018-04-07 DIAGNOSIS — I5033 Acute on chronic diastolic (congestive) heart failure: Secondary | ICD-10-CM | POA: Diagnosis not present

## 2018-04-07 DIAGNOSIS — I890 Lymphedema, not elsewhere classified: Secondary | ICD-10-CM | POA: Diagnosis not present

## 2018-04-07 LAB — BASIC METABOLIC PANEL
BUN / CREAT RATIO: 20 (ref 10–24)
BUN: 18 mg/dL (ref 8–27)
CO2: 20 mmol/L (ref 20–29)
CREATININE: 0.92 mg/dL (ref 0.76–1.27)
Calcium: 9.4 mg/dL (ref 8.6–10.2)
Chloride: 107 mmol/L — ABNORMAL HIGH (ref 96–106)
GFR calc non Af Amer: 76 mL/min/{1.73_m2} (ref >59)
GFR, EST AFRICAN AMERICAN: 87 mL/min/{1.73_m2} (ref >59)
Glucose: 93 mg/dL (ref 65–99)
POTASSIUM: 4.5 mmol/L (ref 3.5–5.2)
SODIUM: 148 mmol/L — AB (ref 134–144)

## 2018-04-10 ENCOUNTER — Telehealth: Payer: Self-pay | Admitting: Family Medicine

## 2018-04-10 DIAGNOSIS — M1712 Unilateral primary osteoarthritis, left knee: Secondary | ICD-10-CM | POA: Diagnosis not present

## 2018-04-10 DIAGNOSIS — I110 Hypertensive heart disease with heart failure: Secondary | ICD-10-CM | POA: Diagnosis not present

## 2018-04-10 DIAGNOSIS — L03115 Cellulitis of right lower limb: Secondary | ICD-10-CM | POA: Diagnosis not present

## 2018-04-10 DIAGNOSIS — I5033 Acute on chronic diastolic (congestive) heart failure: Secondary | ICD-10-CM | POA: Diagnosis not present

## 2018-04-10 DIAGNOSIS — I890 Lymphedema, not elsewhere classified: Secondary | ICD-10-CM | POA: Diagnosis not present

## 2018-04-10 DIAGNOSIS — I2510 Atherosclerotic heart disease of native coronary artery without angina pectoris: Secondary | ICD-10-CM | POA: Diagnosis not present

## 2018-04-10 NOTE — Telephone Encounter (Signed)
Sp[facility] with [name] & gave verbal orders.

## 2018-04-10 NOTE — Telephone Encounter (Signed)
Copied from CRM [id number]. Topic: Quick Communication - Home Health Verbal Orders >> Apr 10, 2018  8:19 AM Crist InfanteHarrald, Kathy J wrote: Caller/Agency: Kathrynn DuckingNancy Wilson//[facility] OT Verbal for  1 wk 1 extension due to delay in equipment

## 2018-04-11 DIAGNOSIS — I2510 Atherosclerotic heart disease of native coronary artery without angina pectoris: Secondary | ICD-10-CM | POA: Diagnosis not present

## 2018-04-11 DIAGNOSIS — I110 Hypertensive heart disease with heart failure: Secondary | ICD-10-CM | POA: Diagnosis not present

## 2018-04-11 DIAGNOSIS — I890 Lymphedema, not elsewhere classified: Secondary | ICD-10-CM | POA: Diagnosis not present

## 2018-04-11 DIAGNOSIS — L03115 Cellulitis of right lower limb: Secondary | ICD-10-CM | POA: Diagnosis not present

## 2018-04-11 DIAGNOSIS — I5033 Acute on chronic diastolic (congestive) heart failure: Secondary | ICD-10-CM | POA: Diagnosis not present

## 2018-04-11 DIAGNOSIS — M1712 Unilateral primary osteoarthritis, left knee: Secondary | ICD-10-CM | POA: Diagnosis not present

## 2018-04-12 DIAGNOSIS — L03115 Cellulitis of right lower limb: Secondary | ICD-10-CM | POA: Diagnosis not present

## 2018-04-12 DIAGNOSIS — I110 Hypertensive heart disease with heart failure: Secondary | ICD-10-CM | POA: Diagnosis not present

## 2018-04-12 DIAGNOSIS — M1712 Unilateral primary osteoarthritis, left knee: Secondary | ICD-10-CM | POA: Diagnosis not present

## 2018-04-12 DIAGNOSIS — I5033 Acute on chronic diastolic (congestive) heart failure: Secondary | ICD-10-CM | POA: Diagnosis not present

## 2018-04-12 DIAGNOSIS — I2510 Atherosclerotic heart disease of native coronary artery without angina pectoris: Secondary | ICD-10-CM | POA: Diagnosis not present

## 2018-04-12 DIAGNOSIS — I890 Lymphedema, not elsewhere classified: Secondary | ICD-10-CM | POA: Diagnosis not present

## 2018-04-17 DIAGNOSIS — M1712 Unilateral primary osteoarthritis, left knee: Secondary | ICD-10-CM | POA: Diagnosis not present

## 2018-04-17 DIAGNOSIS — L03115 Cellulitis of right lower limb: Secondary | ICD-10-CM | POA: Diagnosis not present

## 2018-04-17 DIAGNOSIS — I890 Lymphedema, not elsewhere classified: Secondary | ICD-10-CM | POA: Diagnosis not present

## 2018-04-17 DIAGNOSIS — I5033 Acute on chronic diastolic (congestive) heart failure: Secondary | ICD-10-CM | POA: Diagnosis not present

## 2018-04-17 DIAGNOSIS — I110 Hypertensive heart disease with heart failure: Secondary | ICD-10-CM | POA: Diagnosis not present

## 2018-04-17 DIAGNOSIS — I2510 Atherosclerotic heart disease of native coronary artery without angina pectoris: Secondary | ICD-10-CM | POA: Diagnosis not present

## 2018-04-19 DIAGNOSIS — I2510 Atherosclerotic heart disease of native coronary artery without angina pectoris: Secondary | ICD-10-CM | POA: Diagnosis not present

## 2018-04-19 DIAGNOSIS — I110 Hypertensive heart disease with heart failure: Secondary | ICD-10-CM | POA: Diagnosis not present

## 2018-04-19 DIAGNOSIS — I5033 Acute on chronic diastolic (congestive) heart failure: Secondary | ICD-10-CM | POA: Diagnosis not present

## 2018-04-19 DIAGNOSIS — L03115 Cellulitis of right lower limb: Secondary | ICD-10-CM | POA: Diagnosis not present

## 2018-04-19 DIAGNOSIS — M1712 Unilateral primary osteoarthritis, left knee: Secondary | ICD-10-CM | POA: Diagnosis not present

## 2018-04-19 DIAGNOSIS — I890 Lymphedema, not elsewhere classified: Secondary | ICD-10-CM | POA: Diagnosis not present

## 2018-04-21 NOTE — Progress Notes (Signed)
Patient ID: [name], male    DOB: 1930/08/18, 84 y.o.   MRN: [id number]  HPI  Mr Laker is a 84 y/o male with a history of asthma, CAD, hyperlipidemia, HTN, GERD, lymphedema, remote tobacco use and chronic heart failure.   Echo report from 03/18/18 reviewed and showed an EF of 60-65% along with severe AS,mild-mod AR/MR,mean gradient of 50 mmHg and an elevated PA pressure of 43 mmHg. Echo report from 11/10/16 reviewed and showed an EF of 65-70% along with moderate MR and a PA pressure of 25-30 mm Hg.   Admitted 03/16/18 due to right leg cellulitis and acute on chronic HF. Initially given IV antibiotics and then transitioned to oral medications. Orthopaedic consult obtained. PT consult obtained. Discharged after 4 days. Was in the ED 03/16/18 due to a fall where he was evaluated and released.   He presents today for a follow-up visit with a chief complaint of minimal fatigue upon moderate exertion. He describes this as chronic in nature having been present for several years. He has associated chronic pedal edema (although improving) and left knee pain along with this. He denies any difficulty sleeping, abdominal distention, palpitations, chest pain, shortness of breath, cough or dizziness. Doesn't weigh himself consistently because he has difficulty seeing the numbers on the scale.   Past Medical History:  Diagnosis Date  . (HFpEF) heart failure with preserved ejection fraction (HCC)    a. 03/2018 Echo: EF 60-65%, Gr1 DD.  Marland Kitchen Aortic atherosclerosis (HCC) 05/2013   Per TEE  . Arrhythmia   . Asthma   . Colon polyp   . Coronary artery disease    a. Nonobstructive by 2004 cath b. 05/2013 NSTEMI/Cath: >M 30, LAD 47m, LCX 43m, RCA 30p, EF 60%, ? Sev AS (mod by TEE).  Marland Kitchen GERD (gastroesophageal reflux disease)   . Heart murmur   . Hiatal hernia   . Hyperlipidemia   . Hypertension   . Lymphedema   . Mitral regurgitation    a. 03/2018 Echo: Mild to mod MR.  . Obesity   . Onychomycosis   .  Osteoarthritis   . Recurrent cellulitis of lower extremity   . Severe aortic stenosis    a. 2013 Echo: EF 55%, mod AS; b. TEE 05/2013: EF EF 60-65%, mod AS; c.10/2014 Echo: 55-60%, mod AS; d. 10/2016 Echo: EF 65-70%, Mod-Sev AS; e. 03/2018 Ehco: EF 60-65%, Sev AS, mild to mod AI. Mean grad (S) , Valve Area (VTI): 0.92cm^2.   Past Surgical History:  Procedure Laterality Date  . CARDIAC CATHETERIZATION  11/2002  . CARDIAC CATHETERIZATION  05/21/2013   showing 30% oLM, 30% D1, 20% mLCx, 30% pRCA; EF 60%, severe AS (mean gradient 28 mmHg, peak 26, area 0.9)  . CHOLECYSTECTOMY  2001  . TRANSESOPHAGEAL ECHOCARDIOGRAM  05/22/2013   Preserved EF, moderate AS (valve area by planimetry between 1.0-1.26 cm sq), moderate aortic arch and descending aortic atherosclerosis.   . TRANSTHORACIC ECHOCARDIOGRAM  05/19/2013   EF 60-65%, impaired diastolic function, mild LA dilatation, mild MR/AI/TR, mod AS, high normal RVSP (30.4 mmHg)   Family History  Problem Relation Age of Onset  . Heart disease Mother   . Heart disease Father 83  . Stroke Brother    Social History   Tobacco Use  . Smoking status: Former Smoker    Packs/[place]: 1.50    Years: 20.00    Pack years: 30.00    Types: Cigarettes    Last attempt to quit: 02/22/1981    Years  since quitting: 37.1  . Smokeless tobacco: Never Used  Substance Use Topics  . Alcohol use: No   Allergies  Allergen Reactions  . Aspirin Rash and Other (See Comments)    Reaction: Trouble breathing    Prior to Admission medications   Medication Sig Start Date End Date Taking? Authorizing Provider  atorvastatin (LIPITOR) 80 MG tablet Take 1 tablet (80 mg total) by mouth daily. 10/19/17  Yes Clarisa KindredHackney, Isidra Mings A, FNP  clopidogrel (PLAVIX) 75 MG tablet Take 1 tablet (75 mg total) by mouth daily. 10/19/17  Yes Clarisa KindredHackney, Alaira Level A, FNP  ferrous sulfate 325 (65 FE) MG EC tablet Take 1 tablet (325 mg total) by mouth 2 (two) times daily. 06/06/17  Yes Glori LuisSonnenberg, Eric G, MD  furosemide  (LASIX) 40 MG tablet Take 1 tablet (40 mg total) by mouth 2 (two) times daily. 10/19/17  Yes Anahli Arvanitis A, FNP  hydrocerin (EUCERIN) CREA Apply 1 application topically 2 (two) times daily. Apply to bilateral LEs and feet twice daily after cleansing with house skin cleanser and gently patting dry. No not apply between toes. 08/10/17  Yes Auburn BilberryPatel, Shreyang, MD  losartan (COZAAR) 25 MG tablet Take 1 tablet (25 mg total) by mouth daily. 12/15/17 04/25/19 Yes Gollan, Tollie Pizzaimothy J, MD  metoprolol succinate (TOPROL-XL) 25 MG 24 hr tablet Take 1 tablet (25 mg total) by mouth daily. 10/19/17  Yes Clarisa KindredHackney, Gemma Ruan A, FNP  potassium chloride (K-DUR) 10 MEQ tablet Take 1 tablet (10 mEq total) by mouth 2 (two) times daily. 12/12/17  Yes Delma FreezeHackney, Demosthenes Virnig A, FNP    Review of Systems  Constitutional: Positive for fatigue. Negative for appetite change.  HENT: Positive for hearing loss. Negative for congestion, postnasal drip and sore throat.   Eyes: Negative.   Respiratory: Negative for cough, chest tightness and shortness of breath.   Cardiovascular: Positive for leg swelling (improving). Negative for chest pain and palpitations.  Gastrointestinal: Negative for abdominal distention and abdominal pain.  Endocrine: Negative.   Genitourinary: Negative.   Musculoskeletal: Positive for arthralgias (left knee). Negative for back pain and neck pain.  Skin: Negative.   Allergic/Immunologic: Negative.   Neurological: Negative for dizziness and light-headedness.  Hematological: Negative for adenopathy. Does not bruise/bleed easily.  Psychiatric/Behavioral: Negative for dysphoric mood and sleep disturbance. The patient is not nervous/anxious.    Vitals:   04/24/18 1053  BP: (!) 135/52  Pulse: 82  Resp: 18  SpO2: 100%  Weight: 245 lb 8 oz (111.4 kg)  Height: 5\' 11"  (1.803 m)   Wt Readings from Last 3 Encounters:  04/24/18 245 lb 8 oz (111.4 kg)  04/06/18 253 lb 8 oz (115 kg)  03/17/18 264 lb 11.2 oz (120.1 kg)   Lab Results   Component Value Date   CREATININE 0.92 04/06/2018   CREATININE 0.90 03/27/2018   CREATININE 1.01 03/18/2018    Physical Exam  Constitutional: He appears well-developed and well-nourished.  HENT:  Head: Normocephalic and atraumatic.  Neck: Normal range of motion. Neck supple. No JVD present.  Cardiovascular: Normal rate and regular rhythm.  Pulmonary/Chest: Effort normal. No respiratory distress. He has no wheezes. He has no rales.  Abdominal: Soft. He exhibits no distension. There is no tenderness.  Musculoskeletal: He exhibits edema (thickened skin on right lower leg with chronic venous skin changes; trace pitting edema in left lower leg). He exhibits no tenderness.  Nursing note and vitals reviewed.  Assessment & Plan:  1:Chronic heart failure with preserved ejection fraction- - NYHA class II -  euvolemic today - has scales but has to get someone to read the numbers for him and he doesn't always do it daily; brother that is with him will talk with his granddaughter to see if she can help with reading the numbers so that he can weigh himself daily. Advised him to call for an overnight weight gain of >2 pounds or a weekly weight gain of >5 pounds - weight down 11 pounds from last visit 4 months ago - not adding salt to his food. Reviewed the importance of closely following a 2000mg  sodium diet.  - saw cardiology Brion Aliment) 04/06/18; discussion had about going to TAVR clinic in [place] for severe AS but patient wanted to think about it. Patient says that he really hasn't thought anything about it. Encouraged him and his brother to discuss this and can talk further at his next cardiology appointment in January 2020.  - last BNP 10/13/17 was 127 which is a decrease from 163 on 08/06/17 - PharmD reconciled medications with the patient and his brother  2: HTN- - BP looks good today - saw PCP Birdie Sons) 03/24/18 - BMP 04/06/18 reviewed and showed sodium 148, potassium 4.5, creatinine 0.92 and GFR  87  3: Lymphedema- - says that he's wearing the compression pumps 1-2 times a [place]  - encouraged him to wear them twice daily for about an hour each time with his legs propped up when using them - saw vascular Dr. Lorretta Harp on 04/03/18  Patient did not bring his medications nor a list.   Return in 6 months or sooner for any questions/problems before then.

## 2018-04-24 ENCOUNTER — Encounter: Payer: Self-pay | Admitting: Family

## 2018-04-24 ENCOUNTER — Ambulatory Visit: Payer: Medicare Other | Attending: Family | Admitting: Family

## 2018-04-24 VITALS — BP 135/52 | HR 82 | Resp 18 | Ht 71.0 in | Wt 245.5 lb

## 2018-04-24 DIAGNOSIS — J45909 Unspecified asthma, uncomplicated: Secondary | ICD-10-CM | POA: Diagnosis not present

## 2018-04-24 DIAGNOSIS — Z79899 Other long term (current) drug therapy: Secondary | ICD-10-CM | POA: Diagnosis not present

## 2018-04-24 DIAGNOSIS — I5032 Chronic diastolic (congestive) heart failure: Secondary | ICD-10-CM | POA: Insufficient documentation

## 2018-04-24 DIAGNOSIS — K219 Gastro-esophageal reflux disease without esophagitis: Secondary | ICD-10-CM | POA: Insufficient documentation

## 2018-04-24 DIAGNOSIS — I2510 Atherosclerotic heart disease of native coronary artery without angina pectoris: Secondary | ICD-10-CM | POA: Insufficient documentation

## 2018-04-24 DIAGNOSIS — M25562 Pain in left knee: Secondary | ICD-10-CM | POA: Insufficient documentation

## 2018-04-24 DIAGNOSIS — I10 Essential (primary) hypertension: Secondary | ICD-10-CM

## 2018-04-24 DIAGNOSIS — E785 Hyperlipidemia, unspecified: Secondary | ICD-10-CM | POA: Diagnosis not present

## 2018-04-24 DIAGNOSIS — E669 Obesity, unspecified: Secondary | ICD-10-CM | POA: Diagnosis not present

## 2018-04-24 DIAGNOSIS — Z87891 Personal history of nicotine dependence: Secondary | ICD-10-CM | POA: Insufficient documentation

## 2018-04-24 DIAGNOSIS — I890 Lymphedema, not elsewhere classified: Secondary | ICD-10-CM | POA: Insufficient documentation

## 2018-04-24 DIAGNOSIS — I110 Hypertensive heart disease with heart failure: Secondary | ICD-10-CM | POA: Insufficient documentation

## 2018-04-24 DIAGNOSIS — M1990 Unspecified osteoarthritis, unspecified site: Secondary | ICD-10-CM | POA: Insufficient documentation

## 2018-04-24 DIAGNOSIS — Z886 Allergy status to analgesic agent status: Secondary | ICD-10-CM | POA: Insufficient documentation

## 2018-04-24 DIAGNOSIS — Z8249 Family history of ischemic heart disease and other diseases of the circulatory system: Secondary | ICD-10-CM | POA: Insufficient documentation

## 2018-04-24 NOTE — Progress Notes (Signed)
[facility] - PHARMACIST COUNSELING NOTE   ASSESSMENT   (Brief summary of the presentation/problems identified):   Adherence assessment   Patient is not presently using an adherence strategy.  Will discuss at future visit.   Do you ever forget to take your medication? [] Yes (1) [x] No (0)  Do you ever skip doses due to side effects? [] Yes (1) [x] No (0)  Do you have trouble affording your medicines? [] Yes (1) [x] No (0)  Are you ever unable to pick up your medication due to transportation difficulties? [] Yes (1) [x] No (0)  Do you ever stop taking your medications because you don't believe they are helping? [] Yes (1) [x] No (0)  Total score _0______      Guideline-Directed Medical Therapy/Evidence Based Medicine   ACE/ARB/ARNI: Losartan 25 mg daily   Beta Blocker: Metoprolol succinate 25 mg daily   Aldosterone Antagonist:  Diuretic: Furosemide 40 mg BID   (Problem 1): Possible need for further discussion regarding adherence tools.   PLAN   (Plan 1):  Will further discuss adherence tools at next visit.   (Plan 2): Possible increase with Metoprolol succinate dose if HR continues to permit.    SUBJECTIVE   HPI: Mr [name] is a 84 y/o male with a history of asthma, CAD, hyperlipidemia, HTN, GERD, lymphedema, remote tobacco use and chronic heart failure.    Past Medical History:  Diagnosis Date  . (HFpEF) heart failure with preserved ejection fraction ([place])    a. 03/2018 Echo: EF 60-65%, Gr1 DD.  Marland Kitchen Aortic atherosclerosis ([place]) 05/2013   Per TEE  . Arrhythmia   . Asthma   . CHF (congestive heart failure) ([place])   . Colon polyp   . Coronary artery disease    a. Nonobstructive by 2004 cath b. 05/2013 NSTEMI/Cath: >M 30, LAD 8m, LCX 44m, RCA 30p, EF 60%, ? Sev AS (mod by TEE).  Marland Kitchen GERD (gastroesophageal reflux disease)   . Heart murmur   . Hiatal hernia   . Hyperlipidemia   . Hypertension   . Lymphedema   . Mitral regurgitation    a. 03/2018 Echo: Mild to mod MR.  . Obesity   . Onychomycosis   . Osteoarthritis   . Recurrent cellulitis of lower extremity   . Severe aortic stenosis    a. 2013 Echo: EF 55%, mod AS; b. TEE 05/2013: EF EF 60-65%, mod AS; c.10/2014 Echo: 55-60%, mod AS; d. 10/2016 Echo: EF 65-70%, Mod-Sev AS; e. 03/2018 [facility]co: EF 60-65%, Sev AS, mild to mod AI. Mean grad (S) 7mmHg, Valve Area (VTI): 0.92cm^2.      Current Outpatient Medications:  .  atorvastatin (LIPITOR) 80 MG tablet, Take 1 tablet (80 mg total) by mouth daily., Disp: 90 tablet, Rfl: 3 .  clopidogrel (PLAVIX) 75 MG tablet, Take 1 tablet (75 mg total) by mouth daily., Disp: 90 tablet, Rfl: 3 .  ferrous sulfate 325 (65 FE) MG EC tablet, Take 1 tablet (325 mg total) by mouth 2 (two) times daily., Disp: 60 tablet, Rfl: 2 .  furosemide (LASIX) 40 MG tablet, Take 1 tablet (40 mg total) by mouth 2 (two) times daily., Disp: 180 tablet, Rfl: 3 .  hydrocerin (EUCERIN) CREA, Apply 1 application topically 2 (two) times daily. Apply to bilateral LEs and feet twice daily after cleansing with house skin cleanser and gently patting dry. No not apply between toes., Disp: , Rfl: 0 .  losartan (COZAAR) 25 MG tablet, Take 1 tablet (25 mg total) by mouth  daily., Disp: 90 tablet, Rfl: 3 .  metoprolol succinate (TOPROL-XL) 25 MG 24 hr tablet, Take 1 tablet (25 mg total) by mouth daily., Disp: 90 tablet, Rfl: 3 .  potassium chloride (K-DUR) 10 MEQ tablet, Take 1 tablet (10 mEq total) by mouth 2 (two) times daily., Disp: 180 tablet, Rfl: 3    OBJECTIVE    BMP Latest Ref Rng & Units 04/06/2018 03/27/2018 03/18/2018  Glucose 65 - 99 mg/dL 93 89 102(H)  BUN 8 - 27 mg/dL 18 21 21   Creatinine 0.76 - 1.27 mg/dL 0.92 0.90 1.01  BUN/Creat Ratio 10 - 24 20 - -  Sodium 134 - 144 mmol/L 148(H) 140 143  Potassium 3.5 - 5.2 mmol/L 4.5 3.8 3.3(L)  Chloride 96 - 106 mmol/L 107(H) 104 105  CO2 20 - 29 mmol/L 20 29 30   Calcium 8.6 - 10.2 mg/dL 9.4 9.1 8.4(L)    Vital signs:  HR 82, BP 135/52, weight (kg) 245.8  PE: Deferred  ECHO: Date 03/18/18, EF 60-65%, notes    DRUGS TO AVOID IN HEART FAILURE  Drug or Class Mechanism  Analgesics . NSAIDs . COX-2 inhibitors . Glucocorticoids  Sodium and water retention, increased systemic vascular resistance, decreased response to diuretics   Diabetes Medications . Metformin . Thiazolidinediones o Rosiglitazone (Avandia) o Pioglitazone (Actos) . DPP4 Inhibitors o Saxagliptin (Onglyza) o Sitagliptin (Januvia)   Lactic acidosis Possible calcium channel blockade   Unknown  Antiarrhythmics . Class I  o Flecainide o Disopyramide . Class III o Sotalol . Other o Dronedarone  Negative inotrope, proarrhythmic   Proarrhythmic, beta blockade  Negative inotrope  Antihypertensives . Alpha Blockers o Doxazosin . Calcium Channel Blockers o Diltiazem o Verapamil o Nifedipine . Central Alpha Adrenergics o Moxonidine . Peripheral Vasodilators o Minoxidil  Increases renin and aldosterone  Negative inotrope    Possible sympathetic withdrawal  Unknown  Anti-infective . Itraconazole . Amphotericin B  Negative inotrope Unknown  Hematologic . Anagrelide . Cilostazol   Possible inhibition of PD IV Inhibition of PD III causing arrhythmias  Neurologic/Psychiatric . Stimulants . Anti-Seizure Drugs o Carbamazepine o Pregabalin . Antidepressants o Tricyclics o Citalopram . Parkinsons o Bromocriptine o Pergolide o Pramipexole . Antipsychotics o Clozapine . Antimigraine o Ergotamine o Methysergide . Appetite suppressants . Bipolar o Lithium  Peripheral alpha and beta agonist activity  Negative inotrope and chronotrope Calcium channel blockade  Negative inotrope, proarrhythmic Dose-dependent QT prolongation  Excessive serotonin activity/valvular damage Excessive serotonin activity/valvular damage Unknown  IgE mediated hypersensitivy, calcium channel blockade  Excessive serotonin  activity/valvular damage Excessive serotonin activity/valvular damage Valvular damage  Direct myofibrillar degeneration, adrenergic stimulation  Antimalarials . Chloroquine . Hydroxychloroquine Intracellular inhibition of lysosomal enzymes  Urologic Agents . Alpha Blockers o Doxazosin o Prazosin o Tamsulosin o Terazosin  Increased renin and aldosterone  Adapted from Page RL, et al. "Drugs That May Cause or Exacerbate Heart Failure: A Scientific Statement from the [place]." Circulation 2016; 502:D74-J28. DOI: 10.1161/CIR.0000000000000426   COUNSELING POINTS/CLINICAL PEARLS   Metoprolol Succinate (Goal: 200 mg once daily)  Warn patient to avoid activities requiring mental alertness or coordination until drug effects are realized, as drug may cause dizziness. Tell patient planning major surgery with anesthesia to alert physician that drug is being used, as drug impairs ability of heart to respond to reflex adrenergic stimuli. Drug may cause diarrhea, fatigue, headache, or depression. Advise diabetic patient to carefully monitor blood glucose as drug may mask symptoms of hypoglycemia. Patient should take extended-release tablet with or  immediately following meals. Counsel patient against sudden discontinuation of drug, as this may precipitate hypertension, angina, or myocardial infarction. In the event of a missed dose, counsel patient to skip the missed dose and maintain a regular dosing schedule.  Losartan (Goal: 150 mg once daily)  Warn male patient to avoid pregnancy and to report a pregnancy that occurs during therapy.  Side effects may include dizziness, upper respiratory infection, nasal congestion, and back pain.  Warn patient to avoid use of potassium supplements or potassium-containing salt substitutes unless they consult healthcare provider.  Furosemide  Drug causes sun-sensitivity. Advise patient to use sunscreen and avoid tanning beds. Patient  should avoid activities requiring coordination until drug effects are realized, as drug may cause dizziness, vertigo, or blurred vision. This drug may cause hyperglycemia, hyperuricemia, constipation, diarrhea, loss of appetite, nausea, vomiting, purpuric disorder, cramps, spasticity, asthenia, headache, paresthesia, or scaling eczema. Instruct patient to report unusual bleeding/bruising or signs/symptoms of hypotension, infection, pancreatitis, or ototoxicity (tinnitus, hearing impairment). Advise patient to report signs/symptoms of a severe skin reactions (flu-like symptoms, spreading red rash, or skin/mucous membrane blistering) or erythema multiforme. Instruct patient to eat high-potassium foods during drug therapy, as directed by healthcare professional.  Patient should not drink alcohol while taking this drug.  MEDICATION ADHERENCES TIPS AND STRATEGIES 1. Taking medication as prescribed improves patient outcomes in heart failure (reduces hospitalizations, improves symptoms, increases survival) 2. Side effects of medications can be managed by decreasing doses, switching agents, stopping drugs, or adding additional therapy. Please let someone in the [facility] know if you have having bothersome side effects so we can modify your regimen. Do not alter your medication regimen without talking to [place].  3. Medication reminders can help patients remember to take drugs on time. If you are missing or forgetting doses you can try linking behaviors, using pill boxes, or an electronic reminder like an alarm on your phone or an app. Some people can also get automated phone calls as medication reminders.   Time spent: 10 min  Forrest Moron, Pharm.D. Clinical Pharmacist 04/24/18

## 2018-04-24 NOTE — Patient Instructions (Addendum)
Resume weighing daily and call for an overnight weight gain of > 2 pounds or a weekly weight gain of >5 pounds. 

## 2018-04-25 ENCOUNTER — Ambulatory Visit: Payer: Medicare Other | Admitting: Family

## 2018-04-25 DIAGNOSIS — L03115 Cellulitis of right lower limb: Secondary | ICD-10-CM | POA: Diagnosis not present

## 2018-04-25 DIAGNOSIS — I890 Lymphedema, not elsewhere classified: Secondary | ICD-10-CM | POA: Diagnosis not present

## 2018-04-25 DIAGNOSIS — I5033 Acute on chronic diastolic (congestive) heart failure: Secondary | ICD-10-CM | POA: Diagnosis not present

## 2018-04-25 DIAGNOSIS — I110 Hypertensive heart disease with heart failure: Secondary | ICD-10-CM | POA: Diagnosis not present

## 2018-04-25 DIAGNOSIS — M1712 Unilateral primary osteoarthritis, left knee: Secondary | ICD-10-CM | POA: Diagnosis not present

## 2018-04-25 DIAGNOSIS — I2510 Atherosclerotic heart disease of native coronary artery without angina pectoris: Secondary | ICD-10-CM | POA: Diagnosis not present

## 2018-04-28 DIAGNOSIS — I110 Hypertensive heart disease with heart failure: Secondary | ICD-10-CM | POA: Diagnosis not present

## 2018-04-28 DIAGNOSIS — I2510 Atherosclerotic heart disease of native coronary artery without angina pectoris: Secondary | ICD-10-CM | POA: Diagnosis not present

## 2018-04-28 DIAGNOSIS — I890 Lymphedema, not elsewhere classified: Secondary | ICD-10-CM | POA: Diagnosis not present

## 2018-04-28 DIAGNOSIS — I5033 Acute on chronic diastolic (congestive) heart failure: Secondary | ICD-10-CM | POA: Diagnosis not present

## 2018-04-28 DIAGNOSIS — L03115 Cellulitis of right lower limb: Secondary | ICD-10-CM | POA: Diagnosis not present

## 2018-04-28 DIAGNOSIS — M1712 Unilateral primary osteoarthritis, left knee: Secondary | ICD-10-CM | POA: Diagnosis not present

## 2018-06-11 NOTE — Progress Notes (Signed)
Cardiology Office Note  Date:  06/12/2018   ID:  Archie Patten Leger, DOB [birth year], MRN [id number]  PCP:  Glori Luis, MD   Chief Complaint  Patient presents with  . other    2 month follow up. Meds reviewed by the pt. verbally. "doing well."     HPI:  [name] is a very pleasant 85 year old gentleman with  Moderate aortic valve stenosis, 12/2016  obesity,  diabetes,  smoking history for 30 years,   mild coronary artery disease by cardiac catheterization in 2004,  normal ejection fraction with normal wall motion,  minimal carotid arterial disease  who presents for routine follow-up of his aortic valve stenosis, follow-up after recent hospitalization  Recent hospitalization [facility] March 16 2018 to November 4   lower extremity cellulitis and HFpEF.    treated with antibiotics and IV diuretics   weight from 273 pounds on admission to 264 pounds.   echocardiogram  showed an EF of 60 to 65% with severe aortic stenosis.  mean gradient was 50 mmHg with a valve area of 0.92 cm.  Last visit declined referral for aortic valve workup  In f/u He has not been weighing himself at home  Reports he is not eating as much as he used to, weight continues to decline Weight down from 253 pounds to 237 pounds in the past several months Reports his leg swelling is stable Denies having shortness of breath on exertion Walks with a walker, not very active at baseline Takes Lasix 40 twice daily Compliant with his other medications  Chronic leg swelling from lymphedema Sometimes uses his lymphedema compression pumps, not on a regular basis He does not wear compression hose  Chronic knee pain, previously with cortisone shots  Other past medical history reviewed  hospital March 2019 for bilateral lower extremity cellulitis Acute on chronic diastolic CHF Treated with antibiotics and Lasix  EKG personally reviewed by myself on todays visit Shows normal sinus rhythm with rate 83 bpm right bundle  branch block Rare PVCs noted  Other past medical history reviewed seen in the ER 12/2015: knee pain, Set up appt ortho wife is in a nursing home, had a bilateral stroke.  Lower extremity venous Doppler reviewed with him from 05/08/2015 showing no DVT on the right  Cardiac catheterization July 2004 showed 20% disease in the LAD, RCA and left circumflex Stress test in 2012 shows no significant ischemia, attenuation artifact in the septal wall  Vitamin D 13, hematocrit 42, creatinine 0.77, total cholesterol 157, HDL 54, LDL 89  PMH:   has a past medical history of (HFpEF) heart failure with preserved ejection fraction (HCC), Aortic atherosclerosis (HCC) (05/2013), Arrhythmia, Asthma, CHF (congestive heart failure) (HCC), Colon polyp, Coronary artery disease, GERD (gastroesophageal reflux disease), Heart murmur, Hiatal hernia, Hyperlipidemia, Hypertension, Lymphedema, Mitral regurgitation, Obesity, Onychomycosis, Osteoarthritis, Recurrent cellulitis of lower extremity, and Severe aortic stenosis.  PSH:    Past Surgical History:  Procedure Laterality Date  . CARDIAC CATHETERIZATION  11/2002  . CARDIAC CATHETERIZATION  05/21/2013   showing 30% oLM, 30% D1, 20% mLCx, 30% pRCA; EF 60%, severe AS (mean gradient 28 mmHg, peak 26, area 0.9)  . CHOLECYSTECTOMY  2001  . TRANSESOPHAGEAL ECHOCARDIOGRAM  05/22/2013   Preserved EF, moderate AS (valve area by planimetry between 1.0-1.26 cm sq), moderate aortic arch and descending aortic atherosclerosis.   . TRANSTHORACIC ECHOCARDIOGRAM  05/19/2013   EF 60-65%, impaired diastolic function, mild LA dilatation, mild MR/AI/TR, mod AS, high normal RVSP (30.4 mmHg)  Current Outpatient Medications  Medication Sig Dispense Refill  . atorvastatin (LIPITOR) 80 MG tablet Take 1 tablet (80 mg total) by mouth daily. 90 tablet 3  . clopidogrel (PLAVIX) 75 MG tablet Take 1 tablet (75 mg total) by mouth daily. 90 tablet 3  . ferrous sulfate 325 (65 FE) MG EC tablet  Take 1 tablet (325 mg total) by mouth 2 (two) times daily. 60 tablet 2  . furosemide (LASIX) 40 MG tablet Take 1 tablet (40 mg total) by mouth 2 (two) times daily. 180 tablet 3  . hydrocerin (EUCERIN) CREA Apply 1 application topically 2 (two) times daily. Apply to bilateral LEs and feet twice daily after cleansing with house skin cleanser and gently patting dry. No not apply between toes.  0  . losartan (COZAAR) 25 MG tablet Take 1 tablet (25 mg total) by mouth daily. 90 tablet 3  . metoprolol succinate (TOPROL-XL) 25 MG 24 hr tablet Take 1 tablet (25 mg total) by mouth daily. 90 tablet 3  . potassium chloride (K-DUR) 10 MEQ tablet Take 1 tablet (10 mEq total) by mouth 2 (two) times daily. 180 tablet 3   No current facility-administered medications for this visit.     Allergies:   Aspirin   Social History:  The patient  reports that he quit smoking about 37 years ago. His smoking use included cigarettes. He has a 30.00 pack-year smoking history. He has never used smokeless tobacco. He reports that he does not drink alcohol or use drugs.   Family History:   family history includes Heart disease in his mother; Heart disease (age of onset: 10) in his father; Stroke in his brother.    Review of Systems: Review of Systems  Constitutional: Negative.   Respiratory: Negative.   Cardiovascular: Positive for leg swelling.  Gastrointestinal: Negative.   Musculoskeletal: Positive for joint pain.  Neurological: Negative.   Psychiatric/Behavioral: Negative.   All other systems reviewed and are negative.   PHYSICAL EXAM: VS:  BP (!) 142/58 (BP Location: Left Arm, Patient Position: Sitting, Cuff Size: Normal)   Pulse 83   Ht 5\' 11"  (1.803 m)   Wt 237 lb (107.5 kg)   BMI 33.05 kg/m  , BMI Body mass index is 33.05 kg/m. Constitutional:  oriented to person, place, and time. No distress.  HENT:  Head: Grossly normal Eyes:  no discharge. No scleral icterus.  Neck: No JVD, no carotid bruits   Cardiovascular: Regular rate and rhythm, no murmurs appreciated Minimally pitting lower extremity edema worse on the right than the left Pulmonary/Chest: Clear to auscultation bilaterally, no wheezes or rails Abdominal: Soft.  no distension.  no tenderness.  Musculoskeletal: Normal range of motion Neurological:  normal muscle tone. Coordination normal. No atrophy Skin: Skin warm and dry Psychiatric: normal affect, pleasant  Recent Labs: 09/28/2017: Magnesium 1.9 10/13/2017: B Natriuretic Peptide 127.0 12/07/2017: ALT 10 03/27/2018: Hemoglobin 12.7; Platelets 227.0 04/06/2018: BUN 18; Creatinine, Ser 0.92; Potassium 4.5; Sodium 148    Lipid Panel Lab Results  Component Value Date   CHOL 123 12/07/2017   HDL 43.20 12/07/2017   LDLCALC 68 12/07/2017   TRIG 57.0 12/07/2017      Wt Readings from Last 3 Encounters:  06/12/18 237 lb (107.5 kg)  04/24/18 245 lb 8 oz (111.4 kg)  04/06/18 253 lb 8 oz (115 kg)       ASSESSMENT AND PLAN:  SOB (shortness of breath) - Plan: EKG 12-Lead, ECHOCARDIOGRAM COMPLETE Deconditioned, morbid obesity, Also in the setting of  worsening aortic valve stenosis and chronic diastolic CHF Appears relatively euvolemic on today's visit.  Recommended he continue his current medications, Lasix 40 twice daily  Aortic valve stenosis, etiology of cardiac valve disease unspecified -  Severe stenosis by recent echocardiogram Numbers reviewed with him from recent echo Long discussion with him concerning various treatment options As on his last clinic visit he is very cautious about wanting to proceed with therapy at this time He would likely be a good candidate for TAVR We have explained the process, required cardiac catheterization, [facility], would likely need CT scan He will call us when he is ready to do cardiac catheterization -Discussed symptoms to watch for for progressive aortic valve stenosis including shortness of breath,  chest tightness, near syncope and syncope  Morbid obesity (HCC) Weight is trending downward, eating less Recommend he is to continue slow gentle diet program as he is doing  Type 2 diabetes mellitus with other circulatory complication, unspecified long term insulin use status (HCC) Unable to exercise, he is slowly losing weight through dietary changes Still weight is markedly high  Coronary artery disease, non-occlusive Denies anginal symptoms He would need cardiac catheterization prior to TAVR work-up when he is ready  Mixed hyperlipidemia Continue current regiment Numbers at goal  Lymphedema  Recommend he start using his compression pumps from his house Also compression hose  Long time spent with him today discussing importance of work-up for his aortic valve stenosis Explained that he has limited options but that doing nothing is not a good option as the valve stenosis will progress Discussed cardiac catheterization risk and benefit in detail, discussed CT scan, meeting [place] doctors in [place] for [place] Friend accompanies with him today and would provide rides for him All questions answered Recommend we see him back in 6 months time  Suggested he call us before then when he would like to schedule his cardiac catheterization  Total encounter time more than 45 minutes  Greater than 50% was spent in counseling and coordination of care with the patient   Disposition:   F /U  6 months    Orders Placed This Encounter  Procedures  . EKG 12-Lead     Signed, Dossie Arbourim Madalyn Legner, M.D., Ph.D. 06/12/2018  [facility], [place] [phone]

## 2018-06-12 ENCOUNTER — Encounter: Payer: Self-pay | Admitting: Cardiovascular Disease

## 2018-06-12 ENCOUNTER — Ambulatory Visit (INDEPENDENT_AMBULATORY_CARE_PROVIDER_SITE_OTHER): Payer: Medicare Other | Admitting: Cardiovascular Disease

## 2018-06-12 VITALS — BP 142/58 | HR 83 | Ht 71.0 in | Wt 237.0 lb

## 2018-06-12 DIAGNOSIS — I890 Lymphedema, not elsewhere classified: Secondary | ICD-10-CM

## 2018-06-12 DIAGNOSIS — I2510 Atherosclerotic heart disease of native coronary artery without angina pectoris: Secondary | ICD-10-CM | POA: Diagnosis not present

## 2018-06-12 DIAGNOSIS — I10 Essential (primary) hypertension: Secondary | ICD-10-CM

## 2018-06-12 DIAGNOSIS — I5032 Chronic diastolic (congestive) heart failure: Secondary | ICD-10-CM | POA: Diagnosis not present

## 2018-06-12 DIAGNOSIS — E1159 Type 2 diabetes mellitus with other circulatory complications: Secondary | ICD-10-CM | POA: Diagnosis not present

## 2018-06-12 NOTE — Patient Instructions (Addendum)
Please call when you are ready for cardiac cath As part of the work up for the heart valve   Medication Instructions:  No changes  If you need a refill on your cardiac medications before your next appointment, please call your pharmacy.    Lab work: [employer] today   If you have labs (blood work) drawn today and your tests are completely normal, you will receive your results only by: Marland Kitchen MyChart Message (if you have MyChart) OR . A paper copy in the mail If you have any lab test that is abnormal or we need to change your treatment, we will call you to review the results.   Testing/Procedures: No new testing needed   Follow-Up: At [facility], you and your health needs are our priority.  As part of our continuing mission to provide you with exceptional heart care, we have created designated Provider Care Teams.  These Care Teams include your primary Cardiologist (physician) and Advanced Practice Providers (APPs -  Physician Assistants and Nurse Practitioners) who all work together to provide you with the care you need, when you need it.  . You will need a follow up appointment in 6 months .   Please call our office 2 months in advance to schedule this appointment.    . Providers on your designated Care Team:   . Nicolasa Ducking, NP . Eula Listen, PA-C . Marisue Ivan, PA-C  Any Other Special Instructions Will Be Listed Below (If Applicable).  For educational health videos Log in to : [email or web address] Or : [email or web address], password : [email or web address]

## 2018-06-13 LAB — BASIC METABOLIC PANEL
BUN / CREAT RATIO: 16 (ref 10–24)
BUN: 16 mg/dL (ref 8–27)
CO2: 27 mmol/L (ref 20–29)
Calcium: 9.3 mg/dL (ref 8.6–10.2)
Chloride: 106 mmol/L (ref 96–106)
Creatinine, Ser: 1.01 mg/dL (ref 0.76–1.27)
GFR, EST AFRICAN AMERICAN: 78 mL/min/{1.73_m2} (ref >59)
GFR, EST NON AFRICAN AMERICAN: 68 mL/min/{1.73_m2} (ref >59)
Glucose: 123 mg/dL — ABNORMAL HIGH (ref 65–99)
POTASSIUM: 4.2 mmol/L (ref 3.5–5.2)
SODIUM: 145 mmol/L — AB (ref 134–144)

## 2018-08-07 ENCOUNTER — Telehealth: Payer: Self-pay | Admitting: Family Medicine

## 2018-08-07 MED ORDER — FERROUS SULFATE 325 (65 FE) MG PO TBEC: 325.0000 mg | DELAYED_RELEASE_TABLET | Freq: Two times a day (BID) | ORAL | 2 refills | Status: DC

## 2018-08-07 NOTE — Telephone Encounter (Signed)
Rx has been sent  

## 2018-08-07 NOTE — Telephone Encounter (Signed)
Copied from CRM [id number]. Topic: Quick Communication - Rx Refill/Question >> Aug 07, 2018 10:02 AM Frances Furbish L wrote: Medication: ferrous sulfate 325 (65 FE) MG EC tablet [[id number]] pt daughter called and would like to know if pt should still be taking mediation. Please advise requesting 90 days   Preferred Pharmacy (with phone number or street name):[id number] [place], [place] - [address] AT [facility] OF [address] [phone] (Phone) [phone] (Fax)

## 2018-08-28 DIAGNOSIS — M25569 Pain in unspecified knee: Secondary | ICD-10-CM | POA: Diagnosis not present

## 2018-08-28 DIAGNOSIS — I25119 Atherosclerotic heart disease of native coronary artery with unspecified angina pectoris: Secondary | ICD-10-CM | POA: Diagnosis not present

## 2018-08-28 DIAGNOSIS — I5032 Chronic diastolic (congestive) heart failure: Secondary | ICD-10-CM | POA: Diagnosis not present

## 2018-08-28 DIAGNOSIS — Z6836 Body mass index (BMI) 36.0-36.9, adult: Secondary | ICD-10-CM | POA: Diagnosis not present

## 2018-08-28 DIAGNOSIS — E1169 Type 2 diabetes mellitus with other specified complication: Secondary | ICD-10-CM | POA: Diagnosis not present

## 2018-08-28 DIAGNOSIS — I10 Essential (primary) hypertension: Secondary | ICD-10-CM | POA: Diagnosis not present

## 2018-08-28 DIAGNOSIS — Z0000 Encounter for general adult medical examination without abnormal findings: Secondary | ICD-10-CM | POA: Diagnosis not present

## 2018-08-28 DIAGNOSIS — E785 Hyperlipidemia, unspecified: Secondary | ICD-10-CM | POA: Diagnosis not present

## 2018-08-28 DIAGNOSIS — G8929 Other chronic pain: Secondary | ICD-10-CM | POA: Diagnosis not present

## 2018-08-28 DIAGNOSIS — E1159 Type 2 diabetes mellitus with other circulatory complications: Secondary | ICD-10-CM | POA: Diagnosis not present

## 2018-08-28 DIAGNOSIS — Z23 Encounter for immunization: Secondary | ICD-10-CM | POA: Diagnosis not present

## 2018-08-28 DIAGNOSIS — M1712 Unilateral primary osteoarthritis, left knee: Secondary | ICD-10-CM | POA: Diagnosis not present

## 2018-10-24 ENCOUNTER — Telehealth: Payer: Medicare Other | Admitting: Family

## 2018-11-01 ENCOUNTER — Other Ambulatory Visit: Payer: Self-pay | Admitting: Family

## 2018-11-03 ENCOUNTER — Other Ambulatory Visit: Payer: Self-pay | Admitting: Family

## 2018-11-07 ENCOUNTER — Other Ambulatory Visit: Payer: Self-pay

## 2018-11-07 ENCOUNTER — Ambulatory Visit (INDEPENDENT_AMBULATORY_CARE_PROVIDER_SITE_OTHER): Payer: Medicare Other

## 2018-11-07 DIAGNOSIS — Z0000 Encounter for general adult medical examination without abnormal findings: Secondary | ICD-10-CM

## 2018-11-07 NOTE — Patient Instructions (Addendum)
  [name] , Thank you for taking time to come for your Medicare Wellness Visit. I appreciate your ongoing commitment to your health goals. Please review the following plan we discussed and let me know if I can assist you in the future.   These are the goals we discussed: Goals    . Healthy Lifestyle     Stay hydrated Healthy diet Walk as tolerated       This is a list of the screening recommended for you and due dates:  Health Maintenance  Topic Date Due  . Eye exam for diabetics  12/12/1942  . Tetanus Vaccine  12/12/1951  . Pneumonia vaccines (1 of 2 - PCV13) 12/11/1997  . Complete foot exam   01/20/2017  . Hemoglobin A1C  03/30/2018  . Flu Shot  12/16/2018

## 2018-11-07 NOTE — Progress Notes (Signed)
I have reviewed the above note and agree.  Seferino Oscar, M.D.  

## 2018-11-07 NOTE — Progress Notes (Signed)
Subjective:   [name] is a 85 y.o. male who presents for Medicare Annual/Subsequent preventive examination.  Review of Systems:  No ROS.  Medicare Wellness Virtual Visit.  Visual/audio telehealth visit, UTA vital signs.   See social history for additional risk factors.  Cardiac Risk Factors include: advanced age (>2655men, 82>65 women);male gender;hypertension;diabetes mellitus     Objective:    Vitals: There were no vitals taken for this visit.  There is no height or weight on file to calculate BMI.  Advanced Directives 11/07/2018 03/16/2018 03/16/2018 09/27/2017 08/07/2017 08/06/2017 04/05/2017  Does Patient Have a Medical Advance Directive? Yes No No No No No No  Type of Advance Directive Healthcare Power of Attorney - - - - - -  Does patient want to make changes to medical advance directive? No - Patient declined - - - - - -  Copy of Healthcare Power of Attorney in Chart? No - copy requested - - - - - -  Would patient like information on creating a medical advance directive? - No - Patient declined No - Patient declined No - Patient declined No - Patient declined - Yes (MAU/Ambulatory/Procedural Areas - Information given)    Tobacco Social History   Tobacco Use  Smoking Status Former Smoker   Packs/[place]: 1.50   Years: 20.00   Pack years: 30.00   Types: Cigarettes   Quit date: 02/22/1981   Years since quitting: 37.7  Smokeless Tobacco Never Used     Counseling given: Not Answered   Clinical Intake:  Pre-visit preparation completed: Yes        Diabetes: Yes(Follwed by pcp)  How often do you need to have someone help you when you read instructions, pamphlets, or other written materials from your doctor or pharmacy?: 1 - Never  Interpreter Needed?: No     Past Medical History:  Diagnosis Date   (HFpEF) heart failure with preserved ejection fraction (HCC)    a. 03/2018 Echo: EF 60-65%, Gr1 DD.   Aortic atherosclerosis (HCC) 05/2013   Per TEE    Arrhythmia    Asthma    CHF (congestive heart failure) (HCC)    Colon polyp    Coronary artery disease    a. Nonobstructive by 2004 cath b. 05/2013 NSTEMI/Cath: >M 30, LAD 2826m, LCX 8624m, RCA 30p, EF 60%, ? Sev AS (mod by TEE).   GERD (gastroesophageal reflux disease)    Heart murmur    Hiatal hernia    Hyperlipidemia    Hypertension    Lymphedema    Mitral regurgitation    a. 03/2018 Echo: Mild to mod MR.   Obesity    Onychomycosis    Osteoarthritis    Recurrent cellulitis of lower extremity    Severe aortic stenosis    a. 2013 Echo: EF 55%, mod AS; b. TEE 05/2013: EF EF 60-65%, mod AS; c.10/2014 Echo: 55-60%, mod AS; d. 10/2016 Echo: EF 65-70%, Mod-Sev AS; e. 03/2018 Ehco: EF 60-65%, Sev AS, mild to mod AI. Mean grad (S) 50mmHg, Valve Area (VTI): 0.92cm^2.   Past Surgical History:  Procedure Laterality Date   CARDIAC CATHETERIZATION  11/2002   CARDIAC CATHETERIZATION  05/21/2013   showing 30% oLM, 30% D1, 20% mLCx, 30% pRCA; EF 60%, severe AS (mean gradient 28 mmHg, peak 26, area 0.9)   CHOLECYSTECTOMY  2001   TRANSESOPHAGEAL ECHOCARDIOGRAM  05/22/2013   Preserved EF, moderate AS (valve area by planimetry between 1.0-1.26 cm sq), moderate aortic arch and descending aortic atherosclerosis.  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 strain: Not hard at all   Food insecurity    Worry:  Never true    Inability: Never true   Transportation needs    Medical: No    Non-medical: No  Tobacco Use   Smoking status: Former Smoker    Packs/[place]: 1.50    Years: 20.00    Pack years: 30.00    Types: Cigarettes    Quit date: 02/22/1981    Years since quitting: 37.7   Smokeless tobacco: Never Used  Substance and Sexual Activity   Alcohol use: No   Drug use: No   Sexual activity: Not on file  Lifestyle   Physical activity    Days per week: 0 days    Minutes per session: 0 min   Stress: Only a little  Relationships   Social connections    Talks on phone: Patient refused    Gets together: Patient refused    Attends religious service: Patient refused    Active member of club or organization: Patient refused    Attends meetings of clubs or organizations: Patient refused    Relationship status: Patient refused  Other Topics Concern   Not on file  Social History Narrative   [name] lives in [place] with his daughter. Wife passed away a few months ago. They have a son and daughter. He is retired from [employer]. He enjoys fishing.    Outpatient Encounter Medications as of 11/07/2018  Medication Sig   atorvastatin (LIPITOR) 80 MG tablet TAKE 1 TABLET BY MOUTH DAILY   clopidogrel (PLAVIX) 75 MG tablet TAKE 1 TABLET BY MOUTH DAILY   ferrous sulfate 325 (65 FE) MG EC tablet Take 1 tablet (325 mg total) by mouth 2 (two) times daily.   furosemide (LASIX) 40 MG tablet TAKE 1 TABLET BY MOUTH TWICE DAILY   hydrocerin (EUCERIN) CREA Apply 1 application topically 2 (two) times daily. Apply to bilateral LEs and feet twice daily after cleansing with house skin cleanser and gently patting dry. No not apply between toes.   losartan (COZAAR) 25 MG tablet Take 1 tablet (25 mg total) by mouth daily.   metoprolol succinate (TOPROL-XL) 25 MG 24 hr tablet TAKE 1 TABLET BY MOUTH EVERY [place]   potassium chloride (K-DUR) 10 MEQ tablet Take 1 tablet (10 mEq total) by mouth 2 (two)  times daily.   No facility-administered encounter medications on file as of 11/07/2018.     Activities of Daily Living In your present state of health, do you have any difficulty performing the following activities: 11/07/2018 04/24/2018  Hearing? N N  Vision? N N  Difficulty concentrating or making decisions? Y N  Comment Notes some difficulty remembering. -  Walking or climbing stairs? Y Y  Comment Chronic L knee pain; receieves injections for pain. Gilford Rile  in use when ambulating. -  Dressing or bathing? N N  Doing errands, shopping? Y Y  Comment Patient no longer drives -  Preparing Food and eating ? Y -  Comment Daughter assists. Self feeds. -  Using the Toilet? N -  In the past six months, have you accidently leaked urine? N -  Do you have problems with loss of bowel control? N -  Managing your Medications? Y -  Comment Daughter assists -  Managing your Finances? Y -  Comment Daughter assists -  Housekeeping or managing your Housekeeping? Y -  Comment Daughter assists -  Some recent data might be hidden    Patient Care Team: Glori LuisSonnenberg, Eric G, MD as PCP - General (Family Medicine) Mariah MillingGollan, Tollie Pizzaimothy J, MD as PCP - Cardiology (Cardiology) Delma FreezeHackney, Tina A, FNP as Nurse Practitioner (Family Medicine)   Assessment:   This is a routine wellness examination for [place].  I connected with patient 11/07/18 at 10:30 AM EDT by a video/audio enabled telemedicine application and verified that I am speaking with the correct person using two identifiers. Patient stated full name and DOB. Patient gave permission to continue with virtual visit. Patient's location was at home and Nurse's location was at [facility] office.   Health Screenings  Glaucoma -none Hearing -demonstrates normal hearing during visit. Labs followed by pcp Cholesterol - 11/2017 Dental- dentures Vision- visits within the last 12 months  Social  Alcohol intake - no        Smoking history- former   Smokers in home?  none Illicit drug use? none Exercise - none Diet - regular Sexually Active -not currently BMI- discussed the importance of a healthy diet, water intake and the benefits of aerobic exercise.  Educational material provided.   Safety  Patient feels safe at home- yes Patient does have smoke detectors at home- yes Patient does wear sunscreen or protective clothing when in direct sunlight -yes Patient does wear seat belt when in a moving vehicle -yes Patient drives- no  Covid-19 precautions and sickness symptoms discussed.   Activities of Daily Living Patient denies needing assistance with: feeding themselves, getting from bed to chair, getting to the toilet, bathing/showering, dressing. Daughter assists with cooking, household chores and any ADLs as needed.   Depression Screen Patient denies losing interest in daily life, feeling hopeless, or crying easily over simple problems. Wife died a few months ago. He now lives with his daughter and is doing fine.   Medication-taking as directed and without issues.   Fall Screen Patient denies being afraid of falling or falling in the last year.   Memory Screen Patient is alert.  Correctly identified the president of the [place], season and recall 2/5. Patient likes to read the newspaper for brain stimulation.  Immunizations The following Immunizations were discussed: Influenza, shingles, pneumonia, and tetanus.   Other Providers Patient Care Team: Glori LuisSonnenberg, Eric G, MD as PCP - General (Family Medicine) Antonieta IbaGollan, Timothy J, MD as PCP - Cardiology (Cardiology) Delma FreezeHackney, Tina A, FNP as Nurse Practitioner (Family Medicine)  Exercise Activities and Dietary recommendations Current Exercise Habits: The patient does not participate in regular exercise at present  Goals     Healthy Lifestyle     Stay hydrated Healthy diet Walk as tolerated       Fall Risk Fall Risk  11/07/2018 04/24/2018 12/26/2017 10/26/2017 10/19/2017  Falls in the past year?  0 0 No No No  Number falls in past yr: - 0 - - -  Injury  with Fall? - 0 - - -   Depression Screen PHQ 2/9 Scores 11/07/2018 12/26/2017 10/26/2017 10/19/2017  PHQ - 2 Score 0 0 1 0  PHQ- 9 Score - - - -    Cognitive Function MMSE - Mini Mental State Exam 04/05/2017  Not completed: Refused     6CIT Screen 11/07/2018  What Year? 0 points  What month? 0 points  What time? 0 points  Count back from 20 0 points  Months in reverse 4 points    Immunization History  Administered Date(s) Administered   Influenza Split 02/24/2015   Influenza, High Dose Seasonal PF 01/21/2016, 03/02/2017, 03/24/2018   Influenza-Unspecified 01/15/2013, 01/21/2016, 03/02/2017   Screening Tests Health Maintenance  Topic Date Due   OPHTHALMOLOGY EXAM  12/12/1942   TETANUS/TDAP  12/12/1951   PNA vac Low Risk Adult (1 of 2 - PCV13) 12/11/1997   FOOT EXAM  01/20/2017   HEMOGLOBIN A1C  03/30/2018   INFLUENZA VACCINE  12/16/2018       Plan:    End of life planning; Advance aging; Advanced directives discussed.  Copy of current HCPOA/Living Will requested.    I have personally reviewed and noted the following in the patients chart:    Medical and social history  Use of alcohol, tobacco or illicit drugs   Current medications and supplements  Functional ability and status  Nutritional status  Physical activity  Advanced directives  List of other physicians  Hospitalizations, surgeries, and ER visits in previous 12 months  Vitals  Screenings to include cognitive, depression, and falls  Referrals and appointments  In addition, I have reviewed and discussed with patient certain preventive protocols, quality metrics, and best practice recommendations. A written personalized care plan for preventive services as well as general preventive health recommendations were provided to patient.     Ashok PallOBrien-Blaney, Ersel Wadleigh L, LPN  1/61/09606/23/2020

## 2018-11-09 NOTE — Progress Notes (Signed)
I have reviewed the above note and agree.  Claryssa Sandner, M.D.  

## 2018-11-30 NOTE — Progress Notes (Signed)
Patient ID: [name], male    DOB: 12-03-1930, 85 y.o.   MRN: [id number]  HPI  [name] is a 85 y/o male with a history of asthma, CAD, hyperlipidemia, HTN, GERD, lymphedema, remote tobacco use and chronic heart failure.   Echo report from 03/18/18 reviewed and showed an EF of 60-65% along with severe AS,mild-mod AR/Jeffery,mean gradient of 50 mmHg and an elevated PA pressure of 43 mmHg. Echo report from 11/10/16 reviewed and showed an EF of 65-70% along with moderate Jeffery and a PA pressure of 25-30 mm Hg.   Has not been admitted or been in the ED in the last 6 months.   He presents today for a follow-up visit with a chief complaint of minimal fatigue upon moderate exertion. He describes this as chronic in nature having been present for several years. He has associated shortness of breath and pedal edema along with this. He denies any difficulty sleeping, dizziness, abdominal distention, palpitations, chest pain or cough.   He hasn't been weighing himself daily because he can't see the numbers on the scale.   Past Medical History:  Diagnosis Date  . (HFpEF) heart failure with preserved ejection fraction (HCC)    a. 03/2018 Echo: EF 60-65%, Gr1 DD.  Marland Kitchen. Aortic atherosclerosis (HCC) 05/2013   Per TEE  . Arrhythmia   . Asthma   . CHF (congestive heart failure) (HCC)   . Colon polyp   . Coronary artery disease    a. Nonobstructive by 2004 cath b. 05/2013 NSTEMI/Cath: >M 30, LAD 7210m, LCX 7080m, RCA 30p, EF 60%, ? Sev AS (mod by TEE).  Marland Kitchen. GERD (gastroesophageal reflux disease)   . Heart murmur   . Hiatal hernia   . Hyperlipidemia   . Hypertension   . Lymphedema   . Mitral regurgitation    a. 03/2018 Echo: Mild to mod Jeffery.  . Obesity   . Onychomycosis   . Osteoarthritis   . Recurrent cellulitis of lower extremity   . Severe aortic stenosis    a. 2013 Echo: EF 55%, mod AS; b. TEE 05/2013: EF EF 60-65%, mod AS; c.10/2014 Echo: 55-60%, mod AS; d. 10/2016 Echo: EF 65-70%, Mod-Sev AS; e. 03/2018 Ehco: EF  60-65%, Sev AS, mild to mod AI. Mean grad (S) 50mmHg, Valve Area (VTI): 0.92cm^2.   Past Surgical History:  Procedure Laterality Date  . CARDIAC CATHETERIZATION  11/2002  . CARDIAC CATHETERIZATION  05/21/2013   showing 30% oLM, 30% D1, 20% mLCx, 30% pRCA; EF 60%, severe AS (mean gradient 28 mmHg, peak 26, area 0.9)  . CHOLECYSTECTOMY  2001  . TRANSESOPHAGEAL ECHOCARDIOGRAM  05/22/2013   Preserved EF, moderate AS (valve area by planimetry between 1.0-1.26 cm sq), moderate aortic arch and descending aortic atherosclerosis.   . TRANSTHORACIC ECHOCARDIOGRAM  05/19/2013   EF 60-65%, impaired diastolic function, mild LA dilatation, mild Jeffery/AI/TR, mod AS, high normal RVSP (30.4 mmHg)   Family History  Problem Relation Age of Onset  . Heart disease Mother   . Heart disease Father [id number]  . Stroke Brother    Social History   Tobacco Use  . Smoking status: Former Smoker    Packs/[place]: 1.50    Years: 20.00    Pack years: 30.00    Types: Cigarettes    Quit date: 02/22/1981    Years since quitting: 37.7  . Smokeless tobacco: Never Used  Substance Use Topics  . Alcohol use: No   Allergies  Allergen Reactions  . Aspirin Rash and Other (  See Comments)    Reaction: Trouble breathing    Prior to Admission medications   Medication Sig Start Date End Date Taking? Authorizing Provider  atorvastatin (LIPITOR) 80 MG tablet TAKE 1 TABLET BY MOUTH DAILY 11/01/18  Yes Clarisa KindredHackney, Henlee Donovan A, FNP  clopidogrel (PLAVIX) 75 MG tablet TAKE 1 TABLET BY MOUTH DAILY 11/01/18  Yes Clarisa KindredHackney, Lux Skilton A, FNP  ferrous sulfate 325 (65 FE) MG EC tablet Take 1 tablet (325 mg total) by mouth 2 (two) times daily. 08/07/18  Yes Glori LuisSonnenberg, Eric G, MD  furosemide (LASIX) 40 MG tablet TAKE 1 TABLET BY MOUTH TWICE DAILY 11/04/18  Yes Deasia Chiu A, FNP  hydrocerin (EUCERIN) CREA Apply 1 application topically 2 (two) times daily. Apply to bilateral LEs and feet twice daily after cleansing with house skin cleanser and gently patting dry. No not  apply between toes. 08/10/17  Yes Auburn BilberryPatel, Shreyang, MD  losartan (COZAAR) 25 MG tablet Take 1 tablet (25 mg total) by mouth daily. 12/15/17 04/25/19 Yes Gollan, Tollie Pizzaimothy J, MD  metoprolol succinate (TOPROL-XL) 25 MG 24 hr tablet TAKE 1 TABLET BY MOUTH EVERY [place] 11/01/18  Yes Clarisa KindredHackney, Conrad Zajkowski A, FNP  potassium chloride (K-DUR) 10 MEQ tablet Take 1 tablet (10 mEq total) by mouth 2 (two) times daily. 12/12/17  Yes Delma FreezeHackney, Sebastien Jackson A, FNP    Review of Systems  Constitutional: Positive for fatigue. Negative for appetite change.  HENT: Positive for hearing loss. Negative for congestion, postnasal drip and sore throat.   Eyes: Negative.   Respiratory: Positive for shortness of breath (with moderate exertion). Negative for cough and chest tightness.   Cardiovascular: Positive for leg swelling (improving). Negative for chest pain and palpitations.  Gastrointestinal: Negative for abdominal distention and abdominal pain.  Endocrine: Negative.   Genitourinary: Negative.   Musculoskeletal: Negative for arthralgias, back pain and neck pain.  Skin: Negative.   Allergic/Immunologic: Negative.   Neurological: Negative for dizziness and light-headedness.  Hematological: Negative for adenopathy. Does not bruise/bleed easily.  Psychiatric/Behavioral: Negative for dysphoric mood and sleep disturbance. The patient is not nervous/anxious.    Vitals:   12/01/18 1133  BP: (!) 116/47  Pulse: 96  Resp: 18  SpO2: 96%  Weight: 271 lb 8 oz (123.2 kg)  Height: 5\' 11"  (1.803 m)   Wt Readings from Last 3 Encounters:  12/01/18 271 lb 8 oz (123.2 kg)  06/12/18 237 lb (107.5 kg)  04/24/18 245 lb 8 oz (111.4 kg)   Lab Results  Component Value Date   CREATININE 1.01 06/12/2018   CREATININE 0.92 04/06/2018   CREATININE 0.90 03/27/2018    Physical Exam Vitals signs and nursing note reviewed.  Constitutional:      Appearance: He is well-developed.  HENT:     Head: Normocephalic and atraumatic.  Neck:     Musculoskeletal:  Normal range of motion and neck supple.     Vascular: No JVD.  Cardiovascular:     Rate and Rhythm: Normal rate and regular rhythm.  Pulmonary:     Effort: Pulmonary effort is normal. No respiratory distress.     Breath sounds: No wheezing or rales.  Abdominal:     General: There is no distension.     Palpations: Abdomen is soft.     Tenderness: There is no abdominal tenderness.  Musculoskeletal:        General: No tenderness.     Right lower leg: Edema (lymphedema) present.     Left lower leg: Edema (lymphedema) present.    Assessment &  Plan:  1:Chronic heart failure with preserved ejection fraction- - NYHA class II - euvolemic today - has scales but has to get someone to read the numbers for him and he doesn't always do it daily; Advised him to call for an overnight weight gain of >2 pounds or a weekly weight gain of >5 pounds - weight up 26 pounds from last visit 7 months ago - says that the only fluid pill he's ever taken is furosemide; will stop the furosemide and begin torsemide 40mg  BID - will check a BMP at his next visit - not adding salt to his food. Reviewed the importance of closely following a 2000mg  sodium diet.  - saw cardiology Rockey Situ) 06/12/2018  - last BNP 10/13/17 was 127   2: HTN- - BP looks good although on the low side - saw PCP Ouida Sills) 08/28/2018 - BMP 08/28/2018 reviewed and showed sodium 144, potassium 4.1, creatinine 0.9 and GFR 97  3: Lymphedema- - says that he's wearing the compression pumps once daily & says that his swelling is better after he uses them - encouraged him to wear them twice daily for about an hour each time with his legs propped up when using them - saw vascular Dr. Ronalee Belts on 04/03/18  Patient did not bring his medications nor a list.   Return in 1 month or sooner for any questions/problems before then.

## 2018-12-01 ENCOUNTER — Other Ambulatory Visit: Payer: Self-pay

## 2018-12-01 ENCOUNTER — Encounter: Payer: Self-pay | Admitting: Family

## 2018-12-01 ENCOUNTER — Ambulatory Visit: Payer: Medicare Other | Attending: Family | Admitting: Family

## 2018-12-01 VITALS — BP 116/47 | HR 96 | Resp 18 | Ht 71.0 in | Wt 271.5 lb

## 2018-12-01 DIAGNOSIS — I5032 Chronic diastolic (congestive) heart failure: Secondary | ICD-10-CM | POA: Diagnosis not present

## 2018-12-01 DIAGNOSIS — Z7902 Long term (current) use of antithrombotics/antiplatelets: Secondary | ICD-10-CM | POA: Diagnosis not present

## 2018-12-01 DIAGNOSIS — E669 Obesity, unspecified: Secondary | ICD-10-CM | POA: Insufficient documentation

## 2018-12-01 DIAGNOSIS — Z79899 Other long term (current) drug therapy: Secondary | ICD-10-CM | POA: Diagnosis not present

## 2018-12-01 DIAGNOSIS — I110 Hypertensive heart disease with heart failure: Secondary | ICD-10-CM | POA: Insufficient documentation

## 2018-12-01 DIAGNOSIS — M1990 Unspecified osteoarthritis, unspecified site: Secondary | ICD-10-CM | POA: Diagnosis not present

## 2018-12-01 DIAGNOSIS — E785 Hyperlipidemia, unspecified: Secondary | ICD-10-CM | POA: Insufficient documentation

## 2018-12-01 DIAGNOSIS — Z8249 Family history of ischemic heart disease and other diseases of the circulatory system: Secondary | ICD-10-CM | POA: Insufficient documentation

## 2018-12-01 DIAGNOSIS — I2510 Atherosclerotic heart disease of native coronary artery without angina pectoris: Secondary | ICD-10-CM | POA: Diagnosis not present

## 2018-12-01 DIAGNOSIS — Z8719 Personal history of other diseases of the digestive system: Secondary | ICD-10-CM | POA: Diagnosis not present

## 2018-12-01 DIAGNOSIS — Z87891 Personal history of nicotine dependence: Secondary | ICD-10-CM | POA: Insufficient documentation

## 2018-12-01 DIAGNOSIS — I890 Lymphedema, not elsewhere classified: Secondary | ICD-10-CM | POA: Diagnosis not present

## 2018-12-01 DIAGNOSIS — J45909 Unspecified asthma, uncomplicated: Secondary | ICD-10-CM | POA: Diagnosis not present

## 2018-12-01 DIAGNOSIS — Z886 Allergy status to analgesic agent status: Secondary | ICD-10-CM | POA: Insufficient documentation

## 2018-12-01 DIAGNOSIS — K219 Gastro-esophageal reflux disease without esophagitis: Secondary | ICD-10-CM | POA: Insufficient documentation

## 2018-12-01 DIAGNOSIS — I10 Essential (primary) hypertension: Secondary | ICD-10-CM

## 2018-12-01 MED ORDER — TORSEMIDE 20 MG PO TABS: 40.0000 mg | ORAL_TABLET | Freq: Two times a day (BID) | ORAL | 3 refills | Status: DC

## 2018-12-01 NOTE — Patient Instructions (Addendum)
Continue weighing daily and call for an overnight weight gain of > 2 pounds or a weekly weight gain of >5 pounds.  Stop taking your furosemide (lasix) and begin torsemide 2 tablets in the morning and 2 tablets in the evening.   Try to wear compression boots twice daily.

## 2019-01-01 NOTE — Progress Notes (Deleted)
Patient ID: [name], male    DOB: 1930/07/21, 85 y.o.   MRN: [id number]  HPI  [name] is a 85 y/o male with a history of asthma, CAD, hyperlipidemia, HTN, GERD, lymphedema, remote tobacco use and chronic heart failure.   Echo report from 03/18/18 reviewed and showed an EF of 60-65% along with severe AS,mild-mod AR/Jeffery,mean gradient of 50 mmHg and an elevated PA pressure of 43 mmHg. Echo report from 11/10/16 reviewed and showed an EF of 65-70% along with moderate Jeffery and a PA pressure of 25-30 mm Hg.   Has not been admitted or been in the ED in the last 6 months.   He presents today for a follow-up visit with a chief complaint of   He hasn't been weighing himself daily because he can't see the numbers on the scale.   Past Medical History:  Diagnosis Date  . (HFpEF) heart failure with preserved ejection fraction ([place])    a. 03/2018 Echo: EF 60-65%, Gr1 DD.  Marland Kitchen Aortic atherosclerosis ([place]) 05/2013   Per TEE  . Arrhythmia   . Asthma   . CHF (congestive heart failure) ([place])   . Colon polyp   . Coronary artery disease    a. Nonobstructive by 2004 cath b. 05/2013 NSTEMI/Cath: >M 30, LAD 34m, LCX 75m, RCA 30p, EF 60%, ? Sev AS (mod by TEE).  Marland Kitchen GERD (gastroesophageal reflux disease)   . Heart murmur   . Hiatal hernia   . Hyperlipidemia   . Hypertension   . Lymphedema   . Mitral regurgitation    a. 03/2018 Echo: Mild to mod Jeffery.  . Obesity   . Onychomycosis   . Osteoarthritis   . Recurrent cellulitis of lower extremity   . Severe aortic stenosis    a. 2013 Echo: EF 55%, mod AS; b. TEE 05/2013: EF EF 60-65%, mod AS; c.10/2014 Echo: 55-60%, mod AS; d. 10/2016 Echo: EF 65-70%, Mod-Sev AS; e. 03/2018 [facility]co: EF 60-65%, Sev AS, mild to mod AI. Mean grad (S) 58mmHg, Valve Area (VTI): 0.92cm^2.   Past Surgical History:  Procedure Laterality Date  . CARDIAC CATHETERIZATION  11/2002  . CARDIAC CATHETERIZATION  05/21/2013   showing 30% oLM, 30% D1, 20% mLCx, 30% pRCA; EF 60%, severe AS (mean gradient 28  mmHg, peak 26, area 0.9)  . CHOLECYSTECTOMY  2001  . TRANSESOPHAGEAL ECHOCARDIOGRAM  05/22/2013   Preserved EF, moderate AS (valve area by planimetry between 1.0-1.26 cm sq), moderate aortic arch and descending aortic atherosclerosis.   . TRANSTHORACIC ECHOCARDIOGRAM  05/19/2013   EF 60-65%, impaired diastolic function, mild LA dilatation, mild Jeffery/AI/TR, mod AS, high normal RVSP (30.4 mmHg)   Family History  Problem Relation Age of Onset  . Heart disease Mother   . Heart disease Father 63  . Stroke Brother    Social History   Tobacco Use  . Smoking status: Former Smoker    Packs/[place]: 1.50    Years: 20.00    Pack years: 30.00    Types: Cigarettes    Quit date: 02/22/1981    Years since quitting: 37.8  . Smokeless tobacco: Never Used  Substance Use Topics  . Alcohol use: No   Allergies  Allergen Reactions  . Aspirin Rash and Other (See Comments)    Reaction: Trouble breathing      Review of Systems  Constitutional: Positive for fatigue. Negative for appetite change.  HENT: Positive for hearing loss. Negative for congestion, postnasal drip and sore throat.   Eyes: Negative.  Respiratory: Positive for shortness of breath (with moderate exertion). Negative for cough and chest tightness.   Cardiovascular: Positive for leg swelling (improving). Negative for chest pain and palpitations.  Gastrointestinal: Negative for abdominal distention and abdominal pain.  Endocrine: Negative.   Genitourinary: Negative.   Musculoskeletal: Negative for arthralgias, back pain and neck pain.  Skin: Negative.   Allergic/Immunologic: Negative.   Neurological: Negative for dizziness and light-headedness.  Hematological: Negative for adenopathy. Does not bruise/bleed easily.  Psychiatric/Behavioral: Negative for dysphoric mood and sleep disturbance. The patient is not nervous/anxious.      Physical Exam Vitals signs and nursing note reviewed.  Constitutional:      Appearance: He is  well-developed.  HENT:     Head: Normocephalic and atraumatic.  Neck:     Musculoskeletal: Normal range of motion and neck supple.     Vascular: No JVD.  Cardiovascular:     Rate and Rhythm: Normal rate and regular rhythm.  Pulmonary:     Effort: Pulmonary effort is normal. No respiratory distress.     Breath sounds: No wheezing or rales.  Abdominal:     General: There is no distension.     Palpations: Abdomen is soft.     Tenderness: There is no abdominal tenderness.  Musculoskeletal:        General: No tenderness.     Right lower leg: Edema (lymphedema) present.     Left lower leg: Edema (lymphedema) present.    Assessment & Plan:  1:Chronic heart failure with preserved ejection fraction- - NYHA class II - euvolemic today - has scales but has to get someone to read the numbers for him and he doesn't always do it daily; Advised him to call for an overnight weight gain of >2 pounds or a weekly weight gain of >5 pounds - weight 271.8 pounds from last visit 1 month ago - diuretic changed to torsemide 40mg  BID at his last visit - will check a BMP today - not adding salt to his food. Reviewed the importance of closely following a 2000mg  sodium diet.  - saw cardiology Mariah Milling([place]) 06/12/2018  - last BNP 10/13/17 was 127   2: HTN- - BP  - saw PCP Dareen Piano([place]) 08/28/2018 - BMP 08/28/2018 reviewed and showed sodium 144, potassium 4.1, creatinine 0.9 and GFR 97  3: Lymphedema- - says that he's wearing the compression pumps once daily & says that his swelling is better after he uses them - encouraged him to wear them twice daily for about an hour each time with his legs propped up when using them - saw vascular Dr. Lorretta HarpSchneir on 04/03/18  Patient did not bring his medications nor a list.

## 2019-01-02 ENCOUNTER — Ambulatory Visit: Payer: Medicare Other | Admitting: Family

## 2019-01-03 ENCOUNTER — Other Ambulatory Visit: Payer: Self-pay | Admitting: Cardiovascular Disease

## 2019-01-06 NOTE — Progress Notes (Signed)
Patient ID: [name], male    DOB: March 18, 1931, 85 y.o.   MRN: [id number]  HPI  Mr Muscarella is a 85 y/o male with a history of asthma, CAD, hyperlipidemia, HTN, GERD, lymphedema, remote tobacco use and chronic heart failure.   Echo report from 03/18/18 reviewed and showed an EF of 60-65% along with severe AS,mild-mod AR/MR,mean gradient of 50 mmHg and an elevated PA pressure of 43 mmHg. Echo report from 11/10/16 reviewed and showed an EF of 65-70% along with moderate MR and a PA pressure of 25-30 mm Hg.   Has not been admitted or been in the ED in the last 6 months.   He presents today for a follow-up visit with a chief complaint of minimal shortness of breath upon moderate exertion. He describes this as chronic in nature having been present for several years. He has associated fatigue, pedal edema and left knee pain along with this. He denies any cough, chest pain, palpitations, abdominal distention, dizziness or difficulty sleeping.   He hasn't been weighing himself daily because he can't see the numbers on the scale. Patient says that he wears his compression boots "sometimes" but his brother says that he's not wearing them at all.   Past Medical History:  Diagnosis Date  . (HFpEF) heart failure with preserved ejection fraction ([facility])    a. 03/2018 Echo: EF 60-65%, Gr1 DD.  Marland Kitchen. Aortic atherosclerosis ([facility]) 05/2013   Per TEE  . Arrhythmia   . Asthma   . CHF (congestive heart failure) ([facility])   . Colon polyp   . Coronary artery disease    a. Nonobstructive by 2004 cath b. 05/2013 NSTEMI/Cath: >M 30, LAD 1134m, LCX 5764m, RCA 30p, EF 60%, ? Sev AS (mod by TEE).  Marland Kitchen. GERD (gastroesophageal reflux disease)   . Heart murmur   . Hiatal hernia   . Hyperlipidemia   . Hypertension   . Lymphedema   . Mitral regurgitation    a. 03/2018 Echo: Mild to mod MR.  . Obesity   . Onychomycosis   . Osteoarthritis   . Recurrent cellulitis of lower extremity   . Severe aortic stenosis    a. 2013 Echo: EF 55%, mod  AS; b. TEE 05/2013: EF EF 60-65%, mod AS; c.10/2014 Echo: 55-60%, mod AS; d. 10/2016 Echo: EF 65-70%, Mod-Sev AS; e. 03/2018 [facility]co: EF 60-65%, Sev AS, mild to mod AI. Mean grad (S) 50mmHg, Valve Area (VTI): 0.92cm^2.   Past Surgical History:  Procedure Laterality Date  . CARDIAC CATHETERIZATION  11/2002  . CARDIAC CATHETERIZATION  05/21/2013   showing 30% oLM, 30% D1, 20% mLCx, 30% pRCA; EF 60%, severe AS (mean gradient 28 mmHg, peak 26, area 0.9)  . CHOLECYSTECTOMY  2001  . TRANSESOPHAGEAL ECHOCARDIOGRAM  05/22/2013   Preserved EF, moderate AS (valve area by planimetry between 1.0-1.26 cm sq), moderate aortic arch and descending aortic atherosclerosis.   . TRANSTHORACIC ECHOCARDIOGRAM  05/19/2013   EF 60-65%, impaired diastolic function, mild LA dilatation, mild MR/AI/TR, mod AS, high normal RVSP (30.4 mmHg)   Family History  Problem Relation Age of Onset  . Heart disease Mother   . Heart disease Father [id number]  . Stroke Brother    Social History   Tobacco Use  . Smoking status: Former Smoker    Packs/[place]: 1.50    Years: 20.00    Pack years: 30.00    Types: Cigarettes    Quit date: 02/22/1981    Years since quitting: 37.8  . Smokeless tobacco:  Never Used  Substance Use Topics  . Alcohol use: No   Allergies  Allergen Reactions  . Aspirin Rash and Other (See Comments)    Reaction: Trouble breathing    Prior to Admission medications   Medication Sig Start Date End Date Taking? Authorizing Provider  atorvastatin (LIPITOR) 80 MG tablet TAKE 1 TABLET BY MOUTH DAILY 11/01/18  Yes Darylene Price A, FNP  clopidogrel (PLAVIX) 75 MG tablet TAKE 1 TABLET BY MOUTH DAILY 11/01/18  Yes Darylene Price A, FNP  ferrous sulfate 325 (65 FE) MG EC tablet Take 1 tablet (325 mg total) by mouth 2 (two) times daily. 08/07/18  Yes Leone Haven, MD  hydrocerin (EUCERIN) CREA Apply 1 application topically 2 (two) times daily. Apply to bilateral LEs and feet twice daily after cleansing with house skin cleanser and  gently patting dry. No not apply between toes. 08/10/17  Yes Dustin Flock, MD  losartan (COZAAR) 25 MG tablet TAKE 1 TABLET(25 MG TOTAL) BY MOUTH DAILY. 01/03/19  Yes [place], Kathlene November, MD  metoprolol succinate (TOPROL-XL) 25 MG 24 hr tablet TAKE 1 TABLET BY MOUTH EVERY [place] 11/01/18  Yes Darylene Price A, FNP  potassium chloride (K-DUR) 10 MEQ tablet Take 1 tablet (10 mEq total) by mouth 2 (two) times daily. 12/12/17  Yes Darylene Price A, FNP  torsemide (DEMADEX) 20 MG tablet Take 2 tablets (40 mg total) by mouth 2 (two) times daily. 12/01/18 01/08/19 Yes Alisa Graff, FNP    Review of Systems  Constitutional: Positive for fatigue. Negative for appetite change.  HENT: Positive for hearing loss. Negative for congestion, postnasal drip and sore throat.   Eyes: Negative.   Respiratory: Positive for shortness of breath (with moderate exertion). Negative for cough and chest tightness.   Cardiovascular: Positive for leg swelling. Negative for chest pain and palpitations.  Gastrointestinal: Negative for abdominal distention and abdominal pain.  Endocrine: Negative.   Genitourinary: Negative.   Musculoskeletal: Positive for arthralgias (left knee). Negative for back pain and neck pain.  Skin: Negative.   Allergic/Immunologic: Negative.   Neurological: Negative for dizziness and light-headedness.  Hematological: Negative for adenopathy. Does not bruise/bleed easily.  Psychiatric/Behavioral: Negative for dysphoric mood and sleep disturbance. The patient is not nervous/anxious.    Vitals:   01/08/19 1243  BP: (!) 145/52  Pulse: 92  Resp: (!) 22  Temp: 99.5 F (37.5 C)  TempSrc: Oral  SpO2: 91%  Weight: 273 lb 3.2 oz (123.9 kg)  Height: 5\' 11"  (1.803 m)   Wt Readings from Last 3 Encounters:  01/08/19 273 lb 3.2 oz (123.9 kg)  12/01/18 271 lb 8 oz (123.2 kg)  06/12/18 237 lb (107.5 kg)   Lab Results  Component Value Date   CREATININE 1.01 06/12/2018   CREATININE 0.92 04/06/2018    CREATININE 0.90 03/27/2018    Physical Exam Vitals signs and nursing note reviewed.  Constitutional:      Appearance: He is well-developed.  HENT:     Head: Normocephalic and atraumatic.  Neck:     Musculoskeletal: Normal range of motion and neck supple.     Vascular: No JVD.  Cardiovascular:     Rate and Rhythm: Normal rate and regular rhythm.  Pulmonary:     Effort: Pulmonary effort is normal. No respiratory distress.     Breath sounds: No wheezing or rales.  Abdominal:     General: There is no distension.     Palpations: Abdomen is soft.     Tenderness: There is  no abdominal tenderness.  Musculoskeletal:        General: No tenderness.     Right lower leg: Edema (lymphedema) present.     Left lower leg: Edema (lymphedema) present.    Assessment & Plan:  1:Chronic heart failure with preserved ejection fraction- - NYHA class II - euvolemic today - has scales but has to get someone to read the numbers for him and he doesn't always do it daily; Advised him to call for an overnight weight gain of >2 pounds or a weekly weight gain of >5 pounds - weight up 2 pounds from last visit 1 month ago - diuretic changed to torsemide 40mg  BID at his last visit & he thinks his swelling is a little better - will check a BMP today - not adding salt to his food. Reviewed the importance of closely following a 2000mg  sodium diet.  - saw cardiology Mariah Milling([place]) 06/12/2018  - last BNP 10/13/17 was 127   2: HTN- - BP mildly elevated today - saw PCP Dareen Piano([place]) 08/28/2018 - BMP 08/28/2018 reviewed and showed sodium 144, potassium 4.1, creatinine 0.9 and GFR 97  3: Lymphedema- - says that he's wearing the compression pumps once daily & says that his swelling is better after he uses them - encouraged him to wear them twice daily for about an hour each time with his legs propped up when using them - saw vascular Dr. Lorretta HarpSchneir on 04/03/18 & returns later today  Patient did not bring his medications nor a  list.   Return in 3 months or sooner for any questions/problems before then.

## 2019-01-08 ENCOUNTER — Other Ambulatory Visit: Payer: Self-pay

## 2019-01-08 ENCOUNTER — Encounter (INDEPENDENT_AMBULATORY_CARE_PROVIDER_SITE_OTHER): Payer: Self-pay | Admitting: Vascular Surgery

## 2019-01-08 ENCOUNTER — Ambulatory Visit: Payer: Medicare Other | Attending: Family | Admitting: Family

## 2019-01-08 ENCOUNTER — Ambulatory Visit (INDEPENDENT_AMBULATORY_CARE_PROVIDER_SITE_OTHER): Payer: Medicare Other | Admitting: Vascular Surgery

## 2019-01-08 ENCOUNTER — Encounter: Payer: Self-pay | Admitting: Family

## 2019-01-08 VITALS — BP 147/74 | HR 90 | Resp 18 | Wt 272.8 lb

## 2019-01-08 VITALS — BP 145/52 | HR 92 | Temp 99.5°F | Resp 22 | Ht 71.0 in | Wt 273.2 lb

## 2019-01-08 DIAGNOSIS — I110 Hypertensive heart disease with heart failure: Secondary | ICD-10-CM | POA: Insufficient documentation

## 2019-01-08 DIAGNOSIS — Z9049 Acquired absence of other specified parts of digestive tract: Secondary | ICD-10-CM | POA: Diagnosis not present

## 2019-01-08 DIAGNOSIS — I2510 Atherosclerotic heart disease of native coronary artery without angina pectoris: Secondary | ICD-10-CM | POA: Insufficient documentation

## 2019-01-08 DIAGNOSIS — Z7902 Long term (current) use of antithrombotics/antiplatelets: Secondary | ICD-10-CM | POA: Insufficient documentation

## 2019-01-08 DIAGNOSIS — E782 Mixed hyperlipidemia: Secondary | ICD-10-CM | POA: Diagnosis not present

## 2019-01-08 DIAGNOSIS — I10 Essential (primary) hypertension: Secondary | ICD-10-CM

## 2019-01-08 DIAGNOSIS — Z6838 Body mass index (BMI) 38.0-38.9, adult: Secondary | ICD-10-CM | POA: Diagnosis not present

## 2019-01-08 DIAGNOSIS — I5032 Chronic diastolic (congestive) heart failure: Secondary | ICD-10-CM | POA: Diagnosis not present

## 2019-01-08 DIAGNOSIS — J45909 Unspecified asthma, uncomplicated: Secondary | ICD-10-CM | POA: Diagnosis not present

## 2019-01-08 DIAGNOSIS — E1159 Type 2 diabetes mellitus with other circulatory complications: Secondary | ICD-10-CM

## 2019-01-08 DIAGNOSIS — Z886 Allergy status to analgesic agent status: Secondary | ICD-10-CM | POA: Diagnosis not present

## 2019-01-08 DIAGNOSIS — Z8249 Family history of ischemic heart disease and other diseases of the circulatory system: Secondary | ICD-10-CM | POA: Diagnosis not present

## 2019-01-08 DIAGNOSIS — E785 Hyperlipidemia, unspecified: Secondary | ICD-10-CM | POA: Diagnosis not present

## 2019-01-08 DIAGNOSIS — Z79899 Other long term (current) drug therapy: Secondary | ICD-10-CM | POA: Diagnosis not present

## 2019-01-08 DIAGNOSIS — I872 Venous insufficiency (chronic) (peripheral): Secondary | ICD-10-CM

## 2019-01-08 DIAGNOSIS — E669 Obesity, unspecified: Secondary | ICD-10-CM | POA: Insufficient documentation

## 2019-01-08 DIAGNOSIS — Z823 Family history of stroke: Secondary | ICD-10-CM | POA: Diagnosis not present

## 2019-01-08 DIAGNOSIS — Z87891 Personal history of nicotine dependence: Secondary | ICD-10-CM | POA: Diagnosis not present

## 2019-01-08 DIAGNOSIS — I890 Lymphedema, not elsewhere classified: Secondary | ICD-10-CM

## 2019-01-08 LAB — BASIC METABOLIC PANEL
Anion gap: 7 (ref 5–15)
BUN: 21 mg/dL (ref 8–23)
CO2: 30 mmol/L (ref 22–32)
Calcium: 9.4 mg/dL (ref 8.9–10.3)
Chloride: 109 mmol/L (ref 98–111)
Creatinine, Ser: 1.07 mg/dL (ref 0.61–1.24)
GFR calc Af Amer: >60 mL/min (ref >60)
GFR calc non Af Amer: >60 mL/min (ref >60)
Glucose, Bld: 96 mg/dL (ref 70–99)
Potassium: 4 mmol/L (ref 3.5–5.1)
Sodium: 146 mmol/L — ABNORMAL HIGH (ref 135–145)

## 2019-01-08 NOTE — Progress Notes (Signed)
MRN : [id number]  [name] is a 85 y.o. ([birth year]) male who presents with chief complaint of  Chief Complaint  Patient presents with  . Follow-up    45yr follow up  .  History of Present Illness:   The patient returns to the office for followup evaluation regarding leg swelling.  The swelling has persisted but with the lymph pump is much, much better controlled. The pain associated with swelling is essentially eliminated. There have not been any interval development of a ulcerations or wounds.  The patient denies problems with the pump, noting it is working well and the leggings are in good condition.  Since the previous visit the patient has been wearing graduated compression stockings and using the lymph pump on a routine basis and  has noted significant improvement in the lymphedema.   Patient stated the lymph pump has been a very positive factor in her care.    Current Meds  Medication Sig  . atorvastatin (LIPITOR) 80 MG tablet TAKE 1 TABLET BY MOUTH DAILY  . clopidogrel (PLAVIX) 75 MG tablet TAKE 1 TABLET BY MOUTH DAILY  . ferrous sulfate 325 (65 FE) MG EC tablet Take 1 tablet (325 mg total) by mouth 2 (two) times daily.  . hydrocerin (EUCERIN) CREA Apply 1 application topically 2 (two) times daily. Apply to bilateral LEs and feet twice daily after cleansing with house skin cleanser and gently patting dry. No not apply between toes.  Marland Kitchen losartan (COZAAR) 25 MG tablet TAKE 1 TABLET(25 MG TOTAL) BY MOUTH DAILY.  . metoprolol succinate (TOPROL-XL) 25 MG 24 hr tablet TAKE 1 TABLET BY MOUTH EVERY [place]  . potassium chloride (K-DUR) 10 MEQ tablet Take 1 tablet (10 mEq total) by mouth 2 (two) times daily.  Marland Kitchen torsemide (DEMADEX) 20 MG tablet Take 2 tablets (40 mg total) by mouth 2 (two) times daily.    Past Medical History:  Diagnosis Date  . (HFpEF) heart failure with preserved ejection fraction ([place])    a. 03/2018 Echo: EF 60-65%, Gr1 DD.  Marland Kitchen Aortic atherosclerosis ([place])  05/2013   Per TEE  . Arrhythmia   . Asthma   . CHF (congestive heart failure) ([place])   . Colon polyp   . Coronary artery disease    a. Nonobstructive by 2004 cath b. 05/2013 NSTEMI/Cath: >M 30, LAD 11m, LCX 41m, RCA 30p, EF 60%, ? Sev AS (mod by TEE).  Marland Kitchen GERD (gastroesophageal reflux disease)   . Heart murmur   . Hiatal hernia   . Hyperlipidemia   . Hypertension   . Lymphedema   . Mitral regurgitation    a. 03/2018 Echo: Mild to mod MR.  . Obesity   . Onychomycosis   . Osteoarthritis   . Recurrent cellulitis of lower extremity   . Severe aortic stenosis    a. 2013 Echo: EF 55%, mod AS; b. TEE 05/2013: EF EF 60-65%, mod AS; c.10/2014 Echo: 55-60%, mod AS; d. 10/2016 Echo: EF 65-70%, Mod-Sev AS; e. 03/2018 Ehco: EF 60-65%, Sev AS, mild to mod AI. Mean grad (S) 69mmHg, Valve Area (VTI): 0.92cm^2.    Past Surgical History:  Procedure Laterality Date  . CARDIAC CATHETERIZATION  11/2002  . CARDIAC CATHETERIZATION  05/21/2013   showing 30% oLM, 30% D1, 20% mLCx, 30% pRCA; EF 60%, severe AS (mean gradient 28 mmHg, peak 26, area 0.9)  . CHOLECYSTECTOMY  2001  . TRANSESOPHAGEAL ECHOCARDIOGRAM  05/22/2013   Preserved EF, moderate AS (valve area by planimetry between 1.0-1.26  cm sq), moderate aortic arch and descending aortic atherosclerosis.   . TRANSTHORACIC ECHOCARDIOGRAM  05/19/2013   EF 60-65%, impaired diastolic function, mild LA dilatation, mild MR/AI/TR, mod AS, high normal RVSP (30.4 mmHg)    Social History Social History   Tobacco Use  . Smoking status: Former Smoker    Packs/[place]: 1.50    Years: 20.00    Pack years: 30.00    Types: Cigarettes    Quit date: 02/22/1981    Years since quitting: 37.9  . Smokeless tobacco: Never Used  Substance Use Topics  . Alcohol use: No  . Drug use: No    Family History Family History  Problem Relation Age of Onset  . Heart disease Mother   . Heart disease Father 6360  . Stroke Brother     Allergies  Allergen Reactions  . Aspirin Rash and  Other (See Comments)    Reaction: Trouble breathing      REVIEW OF SYSTEMS (Negative unless checked)  Constitutional: [] Weight loss  [] Fever  [] Chills Cardiac: [] Chest pain   [] Chest pressure   [] Palpitations   [] Shortness of breath when laying flat   [] Shortness of breath with exertion. Vascular:  [] Pain in legs with walking   [x] Pain in legs at rest  [] History of DVT   [] Phlebitis   [x] Swelling in legs   [] Varicose veins   [] Non-healing ulcers Pulmonary:   [] Uses home oxygen   [] Productive cough   [] Hemoptysis   [] Wheeze  [] COPD   [] Asthma Neurologic:  [] Dizziness   [] Seizures   [] History of stroke   [] History of TIA  [] Aphasia   [] Vissual changes   [] Weakness or numbness in arm   [] Weakness or numbness in leg Musculoskeletal:   [] Joint swelling   [] Joint pain   [] Low back pain Hematologic:  [] Easy bruising  [] Easy bleeding   [] Hypercoagulable state   [] Anemic Gastrointestinal:  [] Diarrhea   [] Vomiting  [] Gastroesophageal reflux/heartburn   [] Difficulty swallowing. Genitourinary:  [] Chronic kidney disease   [] Difficult urination  [] Frequent urination   [] Blood in urine Skin:  [] Rashes   [] Ulcers  Psychological:  [] History of anxiety   []  History of major depression.  Physical Examination  Vitals:   01/08/19 1339  BP: (!) 147/74  Pulse: 90  Resp: 18  Weight: 272 lb 12.8 oz (123.7 kg)   Body mass index is 38.05 kg/m. Gen: WD/WN, NAD Head: [place]/AT, No temporalis wasting.  Ear/Nose/Throat: Hearing grossly intact, nares w/o erythema or drainage Eyes: PER, EOMI, sclera nonicteric.  Neck: Supple, no large masses.   Pulmonary:  Good air movement, no audible wheezing bilaterally, no use of accessory muscles.  Cardiac: RRR, no JVD Vascular: scattered varicosities present bilaterally.  Mild venous stasis changes to the legs bilaterally.  3+ soft pitting edema Vessel Right Left  Radial Palpable Palpable  Gastrointestinal: Non-distended. No guarding/no peritoneal signs.  Musculoskeletal:  M/S 5/5 throughout.  No deformity or atrophy.  Neurologic: CN 2-12 intact. Symmetrical.  Speech is fluent. Motor exam as listed above. Psychiatric: Judgment intact, Mood & affect appropriate for pt's clinical situation. Dermatologic: No rashes or ulcers noted.  No changes consistent with cellulitis. Lymph : No lichenification or skin changes of chronic lymphedema.  CBC Lab Results  Component Value Date   WBC 6.9 03/27/2018   HGB 12.7 (L) 03/27/2018   HCT 37.4 (L) 03/27/2018   MCV 80.9 03/27/2018   PLT 227.0 03/27/2018    BMET    Component Value Date/Time   NA 146 (H) 01/08/2019 1303  NA 145 (H) 06/12/2018 1100   NA 141 05/26/2014 1101   K 4.0 01/08/2019 1303   K 3.6 05/26/2014 1101   CL 109 01/08/2019 1303   CL 106 05/26/2014 1101   CO2 30 01/08/2019 1303   CO2 27 05/26/2014 1101   GLUCOSE 96 01/08/2019 1303   GLUCOSE 100 (H) 05/26/2014 1101   BUN 21 01/08/2019 1303   BUN 16 06/12/2018 1100   BUN 16 05/26/2014 1101   CREATININE 1.07 01/08/2019 1303   CREATININE 0.72 12/14/2017 1355   CALCIUM 9.4 01/08/2019 1303   CALCIUM 8.8 05/26/2014 1101   GFRNONAA >60 01/08/2019 1303   GFRNONAA >60 05/26/2014 1101   GFRNONAA >60 05/21/2013 0403   GFRAA >60 01/08/2019 1303   GFRAA >60 05/26/2014 1101   GFRAA >60 05/21/2013 0403   Estimated Creatinine Clearance: 66.4 mL/min (by C-G formula based on SCr of 1.07 mg/dL).  COAG Lab Results  Component Value Date   INR 1.1 05/18/2013    Radiology No results found.   Assessment/Plan 1. Lymphedema  No surgery or intervention at this point in time.    I have reviewed my discussion with the patient regarding lymphedema and why it  causes symptoms.  Patient will continue wearing graduated compression stockings class 1 (20-30 mmHg) on a daily basis a prescription was given. The patient is reminded to put the stockings on first thing in the morning and removing them in the evening. The patient is instructed specifically not to sleep  in the stockings.   In addition, behavioral modification throughout the [place] will be continued.  This will include frequent elevation (such as in a recliner), use of over the counter pain medications as needed and exercise such as walking.  I have reviewed systemic causes for chronic edema such as liver, kidney and cardiac etiologies and there does not appear to be any significant changes in these organ systems over the past year.  The patient is under the impression that these organ systems are all stable and unchanged.    The patient will continue aggressive use of the  lymph pump.  This will continue to improve the edema control and prevent sequela such as ulcers and infections.   The patient will follow-up with me on an annual basis.    2. Chronic venous insufficiency  No surgery or intervention at this point in time.    I have reviewed my discussion with the patient regarding lymphedema and why it  causes symptoms.  Patient will continue wearing graduated compression stockings class 1 (20-30 mmHg) on a daily basis a prescription was given. The patient is reminded to put the stockings on first thing in the morning and removing them in the evening. The patient is instructed specifically not to sleep in the stockings.   In addition, behavioral modification throughout the [place] will be continued.  This will include frequent elevation (such as in a recliner), use of over the counter pain medications as needed and exercise such as walking.  I have reviewed systemic causes for chronic edema such as liver, kidney and cardiac etiologies and there does not appear to be any significant changes in these organ systems over the past year.  The patient is under the impression that these organ systems are all stable and unchanged.    The patient will continue aggressive use of the  lymph pump.  This will continue to improve the edema control and prevent sequela such as ulcers and infections.   The patient  will follow-up with me on an annual basis.    3. Essential hypertension Continue antihypertensive medications as already ordered, these medications have been reviewed and there are no changes at this time.   4. Type 2 diabetes mellitus with other circulatory complication, without long-term current use of insulin (HCC) Continue hypoglycemic medications as already ordered, these medications have been reviewed and there are no changes at this time.  Hgb A1C to be monitored as already arranged by primary service   5. Mixed hyperlipidemia Continue statin as ordered and reviewed, no changes at this time    Levora DredgeGregory , MD  01/08/2019 2:08 PM

## 2019-01-08 NOTE — Patient Instructions (Signed)
Continue weighing daily and call for an overnight weight gain of > 2 pounds or a weekly weight gain of >5 pounds.  Wear compression boots two times every Wedin

## 2019-01-09 ENCOUNTER — Encounter: Payer: Self-pay | Admitting: Family

## 2019-01-14 ENCOUNTER — Encounter (INDEPENDENT_AMBULATORY_CARE_PROVIDER_SITE_OTHER): Payer: Self-pay | Admitting: Vascular Surgery

## 2019-02-10 ENCOUNTER — Other Ambulatory Visit: Payer: Self-pay | Admitting: Cardiovascular Disease

## 2019-02-10 NOTE — Progress Notes (Signed)
Cardiology Office Note  Date:  02/12/2019   ID:  [name], DOB Jul 11, 1930, MRN [id number]  PCP:  Glori Luis, MD   Chief Complaint  Patient presents with  . other    6 mo f/u. SOB sometimes, CP/tightness sometimes.Medications reviewed verbally.    HPI:  [name] is a very pleasant 85 year old gentleman with   aortic valve stenosis, 12/2016 obesity,  diabetes,  smoking history for 30 years,   mild coronary artery disease by cardiac catheterization in 2004,  normal ejection fraction with normal wall motion,  minimal carotid arterial disease  who presents for routine follow-up of his aortic valve stenosis, follow-up after recent hospitalization  Presents with family today Prior office visit declined referral to [facility] for discussion of TAVR  Takes torsemide 40 BID BMP last month: normal Very sedentary  Denies any significant chest pain Chronic mild shortness of breath on exertion  Does not monitor weight at home  Chronic leg swelling from lymphedema Does not use his lymphedema pumps on a regular basis Does not wear compression hose  Chronic knee pain, previously with cortisone shots  EKG personally reviewed by myself on todays visit Shows normal sinus rhythm with rate 68 bpm right bundle branch block, PVCs  Other past medical history reviewed  hospitalization [facility] March 16 2018 to November 4   lower extremity cellulitis and HFpEF.    treated with antibiotics and IV diuretics   weight from 273 pounds on admission to 264 pounds.   echocardiogram  showed an EF of 60 to 65% with severe aortic stenosis.  mean gradient was 50 mmHg with a valve area of 0.92 cm.   hospital March 2019 for bilateral lower extremity cellulitis Acute on chronic diastolic CHF Treated with antibiotics and Lasix  seen in the ER 12/2015: knee pain, Set up appt ortho wife is in a nursing home, had a bilateral stroke.  Lower extremity venous Doppler reviewed with him from  05/08/2015 showing no DVT on the right  Cardiac catheterization July 2004 showed 20% disease in the LAD, RCA and left circumflex Stress test in 2012 shows no significant ischemia, attenuation artifact in the septal wall  PMH:   has a past medical history of (HFpEF) heart failure with preserved ejection fraction (HCC), Aortic atherosclerosis (HCC) (05/2013), Arrhythmia, Asthma, CHF (congestive heart failure) (HCC), Colon polyp, Coronary artery disease, GERD (gastroesophageal reflux disease), Heart murmur, Hiatal hernia, Hyperlipidemia, Hypertension, Lymphedema, Mitral regurgitation, Obesity, Onychomycosis, Osteoarthritis, Recurrent cellulitis of lower extremity, and Severe aortic stenosis.  PSH:    Past Surgical History:  Procedure Laterality Date  . CARDIAC CATHETERIZATION  11/2002  . CARDIAC CATHETERIZATION  05/21/2013   showing 30% oLM, 30% D1, 20% mLCx, 30% pRCA; EF 60%, severe AS (mean gradient 28 mmHg, peak 26, area 0.9)  . CHOLECYSTECTOMY  2001  . TRANSESOPHAGEAL ECHOCARDIOGRAM  05/22/2013   Preserved EF, moderate AS (valve area by planimetry between 1.0-1.26 cm sq), moderate aortic arch and descending aortic atherosclerosis.   . TRANSTHORACIC ECHOCARDIOGRAM  05/19/2013   EF 60-65%, impaired diastolic function, mild LA dilatation, mild MR/AI/TR, mod AS, high normal RVSP (30.4 mmHg)    Current Outpatient Medications  Medication Sig Dispense Refill  . atorvastatin (LIPITOR) 80 MG tablet TAKE 1 TABLET BY MOUTH DAILY 90 tablet 3  . clopidogrel (PLAVIX) 75 MG tablet TAKE 1 TABLET BY MOUTH DAILY 90 tablet 3  . ferrous sulfate 325 (65 FE) MG EC tablet Take 1 tablet (325 mg total) by mouth 2 (two) times daily.  60 tablet 2  . hydrocerin (EUCERIN) CREA Apply 1 application topically 2 (two) times daily. Apply to bilateral LEs and feet twice daily after cleansing with house skin cleanser and gently patting dry. No not apply between toes.  0  . losartan (COZAAR) 25 MG tablet TAKE 1 TABLET(25 MG) BY MOUTH  DAILY 30 tablet 0  . metoprolol succinate (TOPROL-XL) 25 MG 24 hr tablet TAKE 1 TABLET BY MOUTH EVERY [place] 90 tablet 3  . potassium chloride (K-DUR) 10 MEQ tablet Take 1 tablet (10 mEq total) by mouth 2 (two) times daily. 180 tablet 3  . torsemide (DEMADEX) 20 MG tablet Take 2 tablets (40 mg total) by mouth 2 (two) times daily. 120 tablet 3   No current facility-administered medications for this visit.     Allergies:   Aspirin   Social History:  The patient  reports that he quit smoking about 37 years ago. His smoking use included cigarettes. He has a 30.00 pack-year smoking history. He has never used smokeless tobacco. He reports that he does not drink alcohol or use drugs.   Family History:   family history includes Heart disease in his mother; Heart disease (age of onset: 5360) in his father; Stroke in his brother.    Review of Systems: Review of Systems  Constitutional: Negative.   Respiratory: Negative.   Cardiovascular: Positive for leg swelling.  Gastrointestinal: Negative.   Musculoskeletal: Positive for joint pain.  Neurological: Negative.   Psychiatric/Behavioral: Negative.   All other systems reviewed and are negative.   PHYSICAL EXAM: VS:  BP (!) 158/58 (BP Location: Right Arm, Patient Position: Sitting, Cuff Size: Large)   Pulse 68   Ht 5\' 11"  (1.803 m)   Wt 276 lb (125.2 kg)   BMI 38.49 kg/m  , BMI Body mass index is 38.49 kg/m. Constitutional:  oriented to person, place, and time. No distress.  Obese HENT:  Head: Grossly normal Eyes:  no discharge. No scleral icterus.  Neck: No JVD, no carotid bruits  Cardiovascular: Regular rate and rhythm, no murmurs appreciated 1+ pitting lower extremity edema Pulmonary/Chest: Clear to auscultation bilaterally, no wheezes or rails Abdominal: Soft.  no distension.  no tenderness.  Musculoskeletal: Normal range of motion Neurological:  normal muscle tone. Coordination normal. No atrophy Skin: Skin warm and dry Psychiatric:  normal affect, pleasant   Recent Labs: 03/27/2018: Hemoglobin 12.7; Platelets 227.0 01/08/2019: BUN 21; Creatinine, Ser 1.07; Potassium 4.0; Sodium 146    Lipid Panel Lab Results  Component Value Date   CHOL 123 12/07/2017   HDL 43.20 12/07/2017   LDLCALC 68 12/07/2017   TRIG 57.0 12/07/2017      Wt Readings from Last 3 Encounters:  02/12/19 276 lb (125.2 kg)  01/08/19 272 lb 12.8 oz (123.7 kg)  01/08/19 273 lb 3.2 oz (123.9 kg)     ASSESSMENT AND PLAN:  SOB (shortness of breath) - Deconditioned, morbid obesity, Also in the setting of worsening aortic valve stenosis and chronic diastolic CHF -Long discussion with him concerning need for aortic valve procedure Again is reluctant on today's visit " Let me think about it" Unclear as to his reluctance, discussed that he does not want to wait until valve disease is critical -We will place referral, he can [facility] and [facility]  Aortic valve stenosis, etiology of cardiac valve disease unspecified -  Severe stenosis by echocardiogram Little point in reevaluating aortic valve stenosis given symptoms, previous findings November  2019 on echo -Referral made to TAVR clinic He can decide on if he would like to go, and if he would like to have procedure If he continues to decline further evaluation, would consider palliative  Morbid obesity ([place]) Unable to exercise, recommended low carbohydrate diet  Type 2 diabetes mellitus with other circulatory complication, unspecified long term insulin use status ([place]) Defer to primary care, hemoglobin A1c 5.5  Coronary artery disease, non-occlusive  would need cardiac catheterization prior to TAVR work-up  We will see if he goes to his appointment in [facility] for evaluation  Mixed hyperlipidemia Continue current regiment Numbers at goal  Lymphedema  Recommend compliance with his compression pumps Currently not using them on a regular  basis  Long time spent with him today discussing importance of work-up for his aortic valve stenosis.  Again he is very noncommittal  Total encounter time more than 45 minutes  Greater than 50% was spent in counseling and coordination of care with the patient   Disposition:   F /U  6 months    No orders of the defined types were placed in this encounter.    Signed, Esmond Plants, M.D., Ph.D. 02/12/2019  [place], [place]

## 2019-02-12 ENCOUNTER — Encounter: Payer: Self-pay | Admitting: Cardiovascular Disease

## 2019-02-12 ENCOUNTER — Ambulatory Visit (INDEPENDENT_AMBULATORY_CARE_PROVIDER_SITE_OTHER): Payer: Medicare Other | Admitting: Cardiovascular Disease

## 2019-02-12 ENCOUNTER — Other Ambulatory Visit: Payer: Self-pay

## 2019-02-12 VITALS — BP 158/58 | HR 68 | Ht 71.0 in | Wt 276.0 lb

## 2019-02-12 DIAGNOSIS — I890 Lymphedema, not elsewhere classified: Secondary | ICD-10-CM | POA: Diagnosis not present

## 2019-02-12 DIAGNOSIS — I2510 Atherosclerotic heart disease of native coronary artery without angina pectoris: Secondary | ICD-10-CM

## 2019-02-12 DIAGNOSIS — I10 Essential (primary) hypertension: Secondary | ICD-10-CM

## 2019-02-12 DIAGNOSIS — E1159 Type 2 diabetes mellitus with other circulatory complications: Secondary | ICD-10-CM

## 2019-02-12 DIAGNOSIS — I5032 Chronic diastolic (congestive) heart failure: Secondary | ICD-10-CM

## 2019-02-12 DIAGNOSIS — I350 Nonrheumatic aortic (valve) stenosis: Secondary | ICD-10-CM | POA: Diagnosis not present

## 2019-02-12 NOTE — Patient Instructions (Addendum)
   Medication Instructions:   1. No changes  If you need a refill on your cardiac medications before your next appointment, please call your pharmacy.    Lab work:  2. No new labs needed   If you have labs (blood work) drawn today and your tests are completely normal, you will receive your results only by: Marland Kitchen MyChart Message (if you have MyChart) OR . A paper copy in the mail If you have any lab test that is abnormal or we need to change your treatment, we will call you to review the results.   Testing/Procedures:  1. No new testing needed   Follow-Up: At [facility], you and your health needs are our priority.  As part of our continuing mission to provide you with exceptional heart care, we have created designated Provider Care Teams.  These Care Teams include your primary Cardiologist (physician) and Advanced Practice Providers (APPs -  Physician Assistants and Nurse Practitioners) who all work together to provide you with the care you need, when you need it.  . You will need a follow up appointment in 6 months .   Please call our office 2 months in advance to schedule this appointment.    . Providers on your designated Care Team:   . Murray Hodgkins, NP . Christell Faith, PA-C . Marrianne Mood, PA-C  Any Other Special Instructions Will Be Listed Below (If Applicable).  1. We will placed a referral to TAVR clinic in [place] in to : [email or web address] Or : [email or web address], password : [email or web address]

## 2019-03-12 ENCOUNTER — Ambulatory Visit (INDEPENDENT_AMBULATORY_CARE_PROVIDER_SITE_OTHER): Payer: Medicare Other | Admitting: Cardiovascular Disease

## 2019-03-12 ENCOUNTER — Other Ambulatory Visit: Payer: Self-pay

## 2019-03-12 ENCOUNTER — Encounter: Payer: Self-pay | Admitting: Cardiovascular Disease

## 2019-03-12 VITALS — BP 172/52 | HR 83 | Ht 71.0 in | Wt 282.0 lb

## 2019-03-12 DIAGNOSIS — I350 Nonrheumatic aortic (valve) stenosis: Secondary | ICD-10-CM

## 2019-03-12 DIAGNOSIS — I2510 Atherosclerotic heart disease of native coronary artery without angina pectoris: Secondary | ICD-10-CM

## 2019-03-12 NOTE — Progress Notes (Signed)
Structural Heart Clinic Consult Note  Chief Complaint  Patient presents with  . New Patient (Initial Visit)    Severe aortic valve stenosis   History of Present Illness: 85 yo male with history of chronic diastolic CHF, CAD, prior tobacco abuse,  lymphedema, GERD, HTN, hyperlipidemia, moderate mitral regurgitation and severe aortic stenosis who is here today as a new consult in the structural heart clinc, referred by Dr. Mariah MillingGollan, for further discussion regarding his aortic stenosis and possible TAVR. He has chronic lymphedema. LV systolic function has been normal. Mild CAD by cath in 2004. He was admitted to [facility] in October 2019 with volume overload and was diuresed with IV Lasix. Echo November 2019 with LVEF=60-65%. Grade 1 diastolic dysfunction. Severe aortic stenosis with mean gradient 50 mmHg, peak gradient 88 mmHg, AVA 0.92cm2, dimensionless index 0.29.  Mild to moderate mitral regurgitation. He did not wish to have workup for TAVR at that time.   He tells me today that he has some dyspnea but no big change over the past year. He has no chest pain, dizziness or near syncope. He has ongoing fatigue. He has chronic lower extremity swelling due to lymphedema. He [place]. He does not have children. His wife died a month ago from Covid-19. He is retired from [employer]. He has full dentures.   He is not sure he would like to have anything done at this time.   Primary Care Physician: Glori LuisSonnenberg, Eric G, MD Primary Cardiologist: Mariah MillingGollan Referring Cardiologist: Mariah MillingGollan  Past Medical History:  Diagnosis Date  . (HFpEF) heart failure with preserved ejection fraction (HCC)    a. 03/2018 Echo: EF 60-65%, Gr1 DD.  Marland Kitchen. Aortic atherosclerosis (HCC) 05/2013   Per TEE  . Arrhythmia   . Asthma   . CHF (congestive heart failure) (HCC)   . Colon polyp   . Coronary artery disease    a. Nonobstructive by 2004 cath b. 05/2013 NSTEMI/Cath: >M 30, LAD 5415m, LCX 6277m, RCA 30p, EF 60%, ? Sev  AS (mod by TEE).  Marland Kitchen. GERD (gastroesophageal reflux disease)   . Heart murmur   . Hiatal hernia   . Hyperlipidemia   . Hypertension   . Lymphedema   . Mitral regurgitation    a. 03/2018 Echo: Mild to mod MR.  . Obesity   . Onychomycosis   . Osteoarthritis   . Recurrent cellulitis of lower extremity   . Severe aortic stenosis    a. 2013 Echo: EF 55%, mod AS; b. TEE 05/2013: EF EF 60-65%, mod AS; c.10/2014 Echo: 55-60%, mod AS; d. 10/2016 Echo: EF 65-70%, Mod-Sev AS; e. 03/2018 Ehco: EF 60-65%, Sev AS, mild to mod AI. Mean grad (S) 50mmHg, Valve Area (VTI): 0.92cm^2.    Past Surgical History:  Procedure Laterality Date  . CARDIAC CATHETERIZATION  11/2002  . CARDIAC CATHETERIZATION  05/21/2013   showing 30% oLM, 30% D1, 20% mLCx, 30% pRCA; EF 60%, severe AS (mean gradient 28 mmHg, peak 26, area 0.9)  . CHOLECYSTECTOMY  2001  . TRANSESOPHAGEAL ECHOCARDIOGRAM  05/22/2013   Preserved EF, moderate AS (valve area by planimetry between 1.0-1.26 cm sq), moderate aortic arch and descending aortic atherosclerosis.   . TRANSTHORACIC ECHOCARDIOGRAM  05/19/2013   EF 60-65%, impaired diastolic function, mild LA dilatation, mild MR/AI/TR, mod AS, high normal RVSP (30.4 mmHg)    Current Outpatient Medications  Medication Sig Dispense Refill  . atorvastatin (LIPITOR) 80 MG tablet TAKE 1 TABLET BY MOUTH DAILY 90 tablet  3  . clopidogrel (PLAVIX) 75 MG tablet TAKE 1 TABLET BY MOUTH DAILY 90 tablet 3  . ferrous sulfate 325 (65 FE) MG EC tablet Take 1 tablet (325 mg total) by mouth 2 (two) times daily. 60 tablet 2  . hydrocerin (EUCERIN) CREA Apply 1 application topically 2 (two) times daily. Apply to bilateral LEs and feet twice daily after cleansing with house skin cleanser and gently patting dry. No not apply between toes.  0  . losartan (COZAAR) 25 MG tablet TAKE 1 TABLET(25 MG) BY MOUTH DAILY 30 tablet 0  . metoprolol succinate (TOPROL-XL) 25 MG 24 hr tablet TAKE 1 TABLET BY MOUTH EVERY Carthen 90 tablet 3  .  potassium chloride (K-DUR) 10 MEQ tablet Take 1 tablet (10 mEq total) by mouth 2 (two) times daily. 180 tablet 3  . torsemide (DEMADEX) 20 MG tablet Take 2 tablets (40 mg total) by mouth 2 (two) times daily. 120 tablet 3   No current facility-administered medications for this visit.     Allergies  Allergen Reactions  . Aspirin Rash and Other (See Comments)    Reaction: Trouble breathing     Social History   Socioeconomic History  . Marital status: Widowed    Spouse name: Not on file  . Number of children: Not on file  . Years of education: Not on file  . Highest education level: Not on file  Occupational History  . Occupation: Retired Agricultural engineer     Comment: Acupuncturist  Social Needs  . Financial resource strain: Not hard at all  . Food insecurity    Worry: Never true    Inability: Never true  . Transportation needs    Medical: No    Non-medical: No  Tobacco Use  . Smoking status: Former Smoker    Packs/Cockrell: 1.50    Years: 20.00    Pack years: 30.00    Types: Cigarettes    Quit date: 02/22/1981    Years since quitting: 38.0  . Smokeless tobacco: Never Used  Substance and Sexual Activity  . Alcohol use: No  . Drug use: No  . Sexual activity: Not Currently  Lifestyle  . Physical activity    Days per week: 0 days    Minutes per session: 0 min  . Stress: Only a little  Relationships  . Social Musician on phone: Patient refused    Gets together: Patient refused    Attends religious service: Patient refused    Active member of club or organization: Patient refused    Attends meetings of clubs or organizations: Patient refused    Relationship status: Patient refused  . Intimate partner violence    Fear of current or ex partner: No    Emotionally abused: No    Physically abused: No    Forced sexual activity: No  Other Topics Concern  . Not on file  Social History Narrative   Mr. Maslowski lives in [place] with his daughter.  Wife passed away a few months ago. They have a son and daughter. He is retired from [employer]. He enjoys fishing.    Family History  Problem Relation Age of Onset  . Heart disease Mother   . Heart disease Father 71  . Stroke Brother     Review of Systems:  As stated in the HPI and otherwise negative.   BP (!) 172/52   Pulse 83   Ht  (1.803 m)   Wt  282 lb (127.9 kg)   BMI 39.33 kg/m   Physical Examination: General: Well developed, well nourished, NAD  HEENT: OP clear, mucus membranes moist  SKIN: warm, dry. No rashes. Neuro: No focal deficits  Musculoskeletal: Muscle strength 5/5 all ext  Psychiatric: Mood and affect normal  Neck: No JVD, no carotid bruits, no thyromegaly, no lymphadenopathy.  Lungs:Clear bilaterally, no wheezes, rhonci, crackles Cardiovascular: Regular rate and rhythm. Loud, harsh, late peaking systolic murmur.  Abdomen:Soft. Bowel sounds present. Non-tender.  Extremities: 2+ bilateral LE edema with chronic changes of venous stasis and evidence of lymphedema.   EKG:  EKG is ordered today. The ekg ordered today demonstrates NSR, rate 83 bpm. RBBB  Echo 03/18/18: - Left ventricle: The cavity size was normal. There was moderate   concentric hypertrophy. Systolic function was normal. The   estimated ejection fraction was in the range of 60% to 65%. Wall   motion was normal; there were no regional wall motion   abnormalities. Doppler parameters are consistent with abnormal   left ventricular relaxation (grade 1 diastolic dysfunction). - Aortic valve: Trileaflet; severely calcified leaflets.   Transvalvular velocity was increased. There was severe stenosis.   There was mild to moderate regurgitation. Mean gradient (S): 50   mm Hg. Valve area (VTI): 0.92 cm^2. - Mitral valve: There was mild to moderate regurgitation. - Right ventricle: Systolic function was normal. - Pulmonary arteries: PA peak pressure: 43 mm Hg (S).   ------------------------------------------------------------------- Left ventricle:  The cavity size was normal. There was moderate concentric hypertrophy. Systolic function was normal. The estimated ejection fraction was in the range of 60% to 65%. Wall motion was normal; there were no regional wall motion abnormalities. Doppler parameters are consistent with abnormal left ventricular relaxation (grade 1 diastolic dysfunction).  ------------------------------------------------------------------- Aortic valve:  Trileaflet; severely calcified leaflets. Mobility was not restricted.  Doppler:  Transvalvular velocity was increased. There was severe stenosis. There was mild to moderate regurgitation.    Valve area (VTI): 0.92 cm^2. Indexed valve area (VTI): 0.37 cm^2/m^2. Peak velocity ratio of LVOT to aortic valve: 0.29. Valve area (Vmax): 1.31 cm^2. Indexed valve area (Vmax): 0.52 cm^2/m^2.    Mean gradient (S): 50 mm Hg. Peak gradient (S): 88 mm Hg.  ------------------------------------------------------------------- Aorta:  Aortic root: The aortic root was normal in size.  ------------------------------------------------------------------- Mitral valve:   Structurally normal valve.   Mobility was not restricted.  Doppler:  Transvalvular velocity was within the normal range. There was no evidence for stenosis. There was mild to moderate regurgitation.    Valve area by pressure half-time: 3.24 cm^2. Indexed valve area by pressure half-time: 1.3 cm^2/m^2. Valve area by continuity equation (using LVOT flow): 4.77 cm^2. Indexed valve area by continuity equation (using LVOT flow): 1.91 cm^2/m^2.    Mean gradient (D): 2 mm Hg.  ------------------------------------------------------------------- Left atrium:  The atrium was normal in size.  ------------------------------------------------------------------- Right ventricle:  The cavity size was normal. Wall thickness was normal.  Systolic function was normal.  ------------------------------------------------------------------- Pulmonic valve:    Doppler:  Transvalvular velocity was within the normal range. There was no evidence for stenosis.  ------------------------------------------------------------------- Tricuspid valve:   Structurally normal valve.    Doppler: Transvalvular velocity was within the normal range. There was mild regurgitation.  ------------------------------------------------------------------- Pulmonary artery:   The main pulmonary artery was normal-sized. Systolic pressure was mildly elevated.  ------------------------------------------------------------------- Right atrium:  The atrium was normal in size.  ------------------------------------------------------------------- Pericardium:  There was no pericardial effusion.  ------------------------------------------------------------------- Systemic veins: Inferior vena  cava: The vessel was normal in size.  ------------------------------------------------------------------- Measurements   Left ventricle                           Value          Reference  LV ID, ED, PLAX chordal                  43.3  mm       43 - 52  LV ID, ES, PLAX chordal                  30.2  mm       23 - 38  LV fx shortening, PLAX chordal           30    %        >=29  LV PW thickness, ED                      17.7  mm       ----------  IVS/LV PW ratio, ED                      0.99           <=1.3  Stroke volume, 2D                        153   ml       ----------  Stroke volume/bsa, 2D                    61    ml/m^2   ----------  LV e&', lateral                           9.14  cm/s     ----------  LV E/e&', lateral                         6.21           ----------  LV e&', medial                            6.31  cm/s     ----------  LV E/e&', medial                          9              ----------  LV e&', average                           7.73   cm/s     ----------  LV E/e&', average                         7.35           ----------    Ventricular septum                       Value          Reference  IVS thickness, ED                        17.6  mm       ----------    LVOT                                     Value          Reference  LVOT ID, S                               24    mm       ----------  LVOT area                                4.52  cm^2     ----------  LVOT peak velocity, S                    136   cm/s     ----------  LVOT mean velocity, S                    101   cm/s     ----------  LVOT VTI, S                              33.8  cm       ----------  LVOT peak gradient, S                    7     mm Hg    ----------    Aortic valve                             Value          Reference  Aortic valve peak velocity, S            469   cm/s     ----------  Aortic mean gradient, S                  50    mm Hg    ----------  Aortic peak gradient, S                  88    mm Hg    ----------  Aortic valve area, VTI                   0.92  cm^2     ----------  Aortic valve area/bsa, VTI               0.37  cm^2/m^2 ----------  Velocity ratio, peak, LVOT/AV            0.29           ----------  Aortic valve area, peak velocity         1.31  cm^2     ----------  Aortic valve area/bsa, peak              0.52  cm^2/m^2 ----------  velocity  Aortic regurg pressure half-time         377   ms       ----------    Aorta  Value          Reference  Aortic root ID, ED                       33    mm       ----------    Left atrium                              Value          Reference  LA ID, A-P, ES                           39    mm       ----------  LA ID/bsa, A-P                           1.56  cm/m^2   <=2.2  LA volume, S                             84.3  ml       ----------  LA volume/bsa, S                         33.7  ml/m^2   ----------  LA volume, ES, 1-p A4C                   92     ml       ----------  LA volume/bsa, ES, 1-p A4C               36.8  ml/m^2   ----------  LA volume, ES, 1-p A2C                   74.9  ml       ----------  LA volume/bsa, ES, 1-p A2C               30    ml/m^2   ----------    Mitral valve                             Value          Reference  Mitral E-wave peak velocity              56.8  cm/s     ----------  Mitral A-wave peak velocity              117   cm/s     ----------  Mitral mean velocity, D                  73.7  cm/s     ----------  Mitral deceleration time         (H)     232   ms       150 - 230  Mitral pressure half-time                68    ms       ----------  Mitral mean gradient, D                  2     mm Hg    ----------  Mitral E/A ratio,  peak                   0.5            ----------  Mitral valve area, PHT, DP               3.24  cm^2     ----------  Mitral valve area/bsa, PHT, DP           1.3   cm^2/m^2 ----------  Mitral valve area, LVOT                  4.77  cm^2     ----------  continuity  Mitral valve area/bsa, LVOT              1.91  cm^2/m^2 ----------  continuity  Mitral annulus VTI, D                    32    cm       ----------    Pulmonary arteries                       Value          Reference  PA pressure, S, DP               (H)     43    mm Hg    <=30    Tricuspid valve                          Value          Reference  Tricuspid regurg peak velocity           288   cm/s     ----------  Tricuspid peak RV-RA gradient            33    mm Hg    ----------    Right atrium                             Value          Reference  RA ID, S-I, ES, A4C              (H)     63.5  mm       34 - 49  RA area, ES, A4C                 (H)     20.5  cm^2     8.3 - 19.5  RA volume, ES, A/L                       53.5  ml       ----------  RA volume/bsa, ES, A/L                   21.4  ml/m^2   ----------    Right ventricle                          Value          Reference  RV ID, minor axis, ED, A4C base           51    mm       ----------  TAPSE  29.8  mm       ----------  RV pressure, S, DP               (H)     43    mm Hg    <=30    Pulmonic valve                           Value          Reference  Pulmonic valve peak velocity, S          117   cm/s     ----------  Recent Labs: 03/27/2018: Hemoglobin 12.7; Platelets 227.0 01/08/2019: BUN 21; Creatinine, Ser 1.07; Potassium 4.0; Sodium 146   Lipid Panel    Component Value Date/Time   CHOL 123 12/07/2017 1336   CHOL 151 05/19/2013 0213   TRIG 57.0 12/07/2017 1336   TRIG 58 05/19/2013 0213   HDL 43.20 12/07/2017 1336   HDL 49 05/19/2013 0213   CHOLHDL 3 12/07/2017 1336   VLDL 11.4 12/07/2017 1336   VLDL 12 05/19/2013 0213   LDLCALC 68 12/07/2017 1336   LDLCALC 90 05/19/2013 0213     Wt Readings from Last 3 Encounters:  03/12/19 282 lb (127.9 kg)  02/12/19 276 lb (125.2 kg)  01/08/19 272 lb 12.8 oz (123.7 kg)     Other studies Reviewed: Additional studies/ records that were reviewed today include: echo images, office notes Review of the above records demonstrates: severe AS   Assessment and Plan:   1. Severe Aortic Valve Stenosis: He has severe, stage D aortic valve stenosis. I have personally reviewed the echo images. The aortic valve is thickened, calcified with limited leaflet mobility. I think he would benefit from AVR. Given advanced age, he is not a good candidate for conventional AVR by surgical approach. I think he may be a good candidate for TAVR.   STS Risk Score: Risk of Mortality: 2.466% Renal Failure: 3.824% Permanent Stroke: 2.616% Prolonged Ventilation: 9.670% DSW Infection: 0.104% Reoperation: 5.427% Morbidity or Mortality: 15.203% Short Length of Stay: 25.433% Long Length of Stay: 5.906%   I have reviewed the natural history of aortic stenosis with the patient and their family members  who are present today. We have discussed the limitations of medical therapy  and the poor prognosis associated with symptomatic aortic stenosis. We have reviewed potential treatment options, including palliative medical therapy, conventional surgical aortic valve replacement, and transcatheter aortic valve replacement. We discussed treatment options in the context of the patient's specific comorbid medical conditions.   He is not sure he will proceed with any further workup at this time. He would like to go home and think about it for a few weeks. If he decides to proceed with workup for TAVR, we would begin with arranging a cardiac cath and repeating his echo. Risks and benefits of the cath procedure and the valve procedure are reviewed with the patient.     Current medicines are reviewed at length with the patient today.  The patient does not have concerns regarding medicines.  The following changes have been made:  no change  Labs/ tests ordered today include:   Orders Placed This Encounter  Procedures  . EKG 12-Lead     Disposition:   FU with the valve team    Signed, Verne Carrow, MD 03/12/2019 2:43 PM    [facility] [address] [place], [place], [place]  [address] Phone: [phone])  [phone]; Fax: [phone]

## 2019-03-12 NOTE — Patient Instructions (Signed)
Medication Instructions:  No changes *If you need a refill on your cardiac medications before your next appointment, please call your pharmacy*  Lab Work: none If you have labs (blood work) drawn today and your tests are completely normal, you will receive your results only by: Marland Kitchen MyChart Message (if you have MyChart) OR . A paper copy in the mail If you have any lab test that is abnormal or we need to change your treatment, we will call you to review the results.  Testing/Procedures: none   Other Instructions Call if you'd like to schedule a follow up appointment to further discuss valve surgery.

## 2019-03-16 ENCOUNTER — Other Ambulatory Visit: Payer: Self-pay | Admitting: Cardiovascular Disease

## 2019-04-05 ENCOUNTER — Ambulatory Visit (INDEPENDENT_AMBULATORY_CARE_PROVIDER_SITE_OTHER): Payer: Medicare Other | Admitting: Vascular Surgery

## 2019-04-10 ENCOUNTER — Ambulatory Visit: Payer: Medicare Other | Admitting: Family

## 2019-04-10 ENCOUNTER — Telehealth: Payer: Self-pay

## 2019-04-10 NOTE — Telephone Encounter (Signed)
Thanks Lauren!

## 2019-04-10 NOTE — Telephone Encounter (Signed)
  [facility] [place]  [name] was evaluated by Dr Angelena Form on 10/26 for TAVR consult and at that time the pt wanted to think about whether or not he may want to proceed with additional testing.  I reached out to the pt today and he has not made a decision about proceeding with TAVR evaluation.  He said his younger brother is in the hospital and he is unsure how things will turn out with him and he wants to hold off on any other appointments at this time. I will plan to contact the pt again in a few weeks for an update.

## 2019-04-24 NOTE — Progress Notes (Signed)
Patient ID: [name], male    DOB: December 03, 1930, 85 y.o.   MRN: [id number]  HPI  [name] is a 85 y/o male with a history of asthma, CAD, hyperlipidemia, HTN, GERD, lymphedema, remote tobacco use and chronic heart failure.   Echo report from 03/18/18 reviewed and showed an EF of 60-65% along with severe AS,mild-mod AR/Jeffery,mean gradient of 50 mmHg and an elevated PA pressure of 43 mmHg. Echo report from 11/10/16 reviewed and showed an EF of 65-70% along with moderate Jeffery and a PA pressure of 25-30 mm Hg.   Has not been admitted or been in the ED in the last 6 months.   He presents today for a follow-up visit with a chief complaint of minimal shortness of breath upon moderate exertion. He says this has been chronic in nature having been present for several years. He has associated fatigue, chronic lymphedema, left knee pain and gradual weight gain along with this. He denies any difficulty sleeping, dizziness, abdominal distention, palpitations, chest pain, cough or change in his appetite.   Says that his wife passed away 3 weeks ago and he currently has a brother in the hospital not doing well.  Past Medical History:  Diagnosis Date  . (HFpEF) heart failure with preserved ejection fraction (HCC)    a. 03/2018 Echo: EF 60-65%, Gr1 DD.  Marland Kitchen Aortic atherosclerosis (HCC) 05/2013   Per TEE  . Arrhythmia   . Asthma   . CHF (congestive heart failure) (HCC)   . Colon polyp   . Coronary artery disease    a. Nonobstructive by 2004 cath b. 05/2013 NSTEMI/Cath: >M 30, LAD 87m, LCX 26m, RCA 30p, EF 60%, ? Sev AS (mod by TEE).  Marland Kitchen GERD (gastroesophageal reflux disease)   . Heart murmur   . Hiatal hernia   . Hyperlipidemia   . Hypertension   . Lymphedema   . Mitral regurgitation    a. 03/2018 Echo: Mild to mod Jeffery.  . Obesity   . Onychomycosis   . Osteoarthritis   . Recurrent cellulitis of lower extremity   . Severe aortic stenosis    a. 2013 Echo: EF 55%, mod AS; b. TEE 05/2013: EF EF 60-65%, mod AS;  c.10/2014 Echo: 55-60%, mod AS; d. 10/2016 Echo: EF 65-70%, Mod-Sev AS; e. 03/2018 Ehco: EF 60-65%, Sev AS, mild to mod AI. Mean grad (S) , Valve Area (VTI): 0.92cm^2.   Past Surgical History:  Procedure Laterality Date  . CARDIAC CATHETERIZATION  11/2002  . CARDIAC CATHETERIZATION  05/21/2013   showing 30% oLM, 30% D1, 20% mLCx, 30% pRCA; EF 60%, severe AS (mean gradient 28 mmHg, peak 26, area 0.9)  . CHOLECYSTECTOMY  2001  . TRANSESOPHAGEAL ECHOCARDIOGRAM  05/22/2013   Preserved EF, moderate AS (valve area by planimetry between 1.0-1.26 cm sq), moderate aortic arch and descending aortic atherosclerosis.   . TRANSTHORACIC ECHOCARDIOGRAM  05/19/2013   EF 60-65%, impaired diastolic function, mild LA dilatation, mild Jeffery/AI/TR, mod AS, high normal RVSP (30.4 mmHg)   Family History  Problem Relation Age of Onset  . Heart disease Mother   . Heart disease Father 15  . Stroke Brother    Social History   Tobacco Use  . Smoking status: Former Smoker    Packs/[place]: 1.50    Years: 20.00    Pack years: 30.00    Types: Cigarettes    Quit date: 02/22/1981    Years since quitting: 38.1  . Smokeless tobacco: Never Used  Substance Use Topics  .  Alcohol use: No   Allergies  Allergen Reactions  . Aspirin Rash and Other (See Comments)    Reaction: Trouble breathing    Prior to Admission medications   Medication Sig Start Date End Date Taking? Authorizing Provider  atorvastatin (LIPITOR) 80 MG tablet TAKE 1 TABLET BY MOUTH DAILY 11/01/18  Yes Clarisa KindredHackney, Mckinzey Entwistle A, FNP  clopidogrel (PLAVIX) 75 MG tablet TAKE 1 TABLET BY MOUTH DAILY 11/01/18  Yes Clarisa KindredHackney, Luccia Reinheimer A, FNP  ferrous sulfate 325 (65 FE) MG EC tablet Take 1 tablet (325 mg total) by mouth 2 (two) times daily. 08/07/18  Yes Glori LuisSonnenberg, Eric G, MD  hydrocerin (EUCERIN) CREA Apply 1 application topically 2 (two) times daily. Apply to bilateral LEs and feet twice daily after cleansing with house skin cleanser and gently patting dry. No not apply between  toes. 08/10/17  Yes Auburn BilberryPatel, Shreyang, MD  losartan (COZAAR) 25 MG tablet TAKE 1 TABLET(25 MG) BY MOUTH DAILY 03/16/19  Yes Gollan, Tollie Pizzaimothy J, MD  metoprolol succinate (TOPROL-XL) 25 MG 24 hr tablet TAKE 1 TABLET BY MOUTH EVERY [place] 11/01/18  Yes Clarisa KindredHackney, Elishia Kaczorowski A, FNP  potassium chloride (K-DUR) 10 MEQ tablet Take 1 tablet (10 mEq total) by mouth 2 (two) times daily. 12/12/17  Yes Clarisa KindredHackney, Jeronimo Hellberg A, FNP  torsemide (DEMADEX) 20 MG tablet Take 2 tablets (40 mg total) by mouth 2 (two) times daily. 12/01/18 04/25/19 Yes Delma FreezeHackney, Eldred Lievanos A, FNP     Review of Systems  Constitutional: Positive for fatigue. Negative for appetite change.  HENT: Positive for hearing loss. Negative for congestion, postnasal drip and sore throat.   Eyes: Negative.   Respiratory: Positive for shortness of breath (with moderate exertion). Negative for cough and chest tightness.   Cardiovascular: Positive for leg swelling. Negative for chest pain and palpitations.  Gastrointestinal: Negative for abdominal distention and abdominal pain.  Endocrine: Negative.   Genitourinary: Negative.   Musculoskeletal: Positive for arthralgias (left knee). Negative for back pain and neck pain.  Skin: Negative.   Allergic/Immunologic: Negative.   Neurological: Negative for dizziness and light-headedness.  Hematological: Negative for adenopathy. Does not bruise/bleed easily.  Psychiatric/Behavioral: Negative for dysphoric mood and sleep disturbance. The patient is not nervous/anxious.    Vitals:   04/25/19 1208  BP: (!) 134/44  Pulse: 99  Resp: 16  SpO2: 100%  Weight: 282 lb 3.2 oz (128 kg)  Height: 5\' 11"  (1.803 m)   Wt Readings from Last 3 Encounters:  04/25/19 282 lb 3.2 oz (128 kg)  03/12/19 282 lb (127.9 kg)  02/12/19 276 lb (125.2 kg)   Lab Results  Component Value Date   CREATININE 1.07 01/08/2019   CREATININE 1.01 06/12/2018   CREATININE 0.92 04/06/2018    Physical Exam Vitals signs and nursing note reviewed.  Constitutional:       Appearance: He is well-developed.  HENT:     Head: Normocephalic and atraumatic.     Right Ear: Decreased hearing noted.     Left Ear: Decreased hearing noted.  Neck:     Musculoskeletal: Normal range of motion and neck supple.     Vascular: No JVD.  Cardiovascular:     Rate and Rhythm: Normal rate and regular rhythm.     Heart sounds: Murmur present. Systolic murmur present.  Pulmonary:     Effort: Pulmonary effort is normal. No respiratory distress.     Breath sounds: Wheezing (RUL) and rhonchi (RLL) present. No rales.  Abdominal:     General: There is no distension.  Palpations: Abdomen is soft.     Tenderness: There is no abdominal tenderness.  Musculoskeletal:        General: No tenderness.     Right lower leg: Edema (lymphedema) present.     Left lower leg: Edema (lymphedema) present.  Skin:    General: Skin is warm and dry.  Neurological:     General: No focal deficit present.     Mental Status: He is oriented to person, place, and time.  Psychiatric:        Mood and Affect: Mood normal.        Behavior: Behavior normal.    Assessment & Plan:  1:Chronic heart failure with preserved ejection fraction- - NYHA class II - euvolemic today - has scales but has to get someone to read the numbers for him and he doesn't always do it daily; Advised him to call for an overnight weight gain of >2 pounds or a weekly weight gain of >5 pounds - weight up 9 pounds from last visit 3 months ago; says that he's been eating more and moving less; encouraged him to increase his activity even just a little bit - not adding salt to his food. Reviewed the importance of closely following a 2000mg  sodium diet.  - saw cardiology Rockey Situ) 02/12/2019  - last BNP 10/13/17 was 127  - he thinks he's gotten his flu vaccine for this season  2: HTN- - BP looks good today - saw PCP Ouida Sills) 08/28/2018 - BMP 01/08/2019 reviewed and showed sodium 146, potassium 4.0, creatinine 1.07 and GFR  >60  3: Lymphedema- - says that he's wearing the compression pumps once daily & says that his swelling is better after he uses them - saw vascular Dr. Ronalee Belts on 01/08/2019  4: Severe aortic stenosis- - evaluated by Angelena Form for possible TAVR on 03/12/2019 - patient says that he's currently not interested in pursuing surgery until he sees how his brother does  Patient did not bring his medications nor a list.   Will not make a return appointment for patient at this time. Advised patient and his brother that is with him that they could call back at anytime to make another appointment.

## 2019-04-25 ENCOUNTER — Other Ambulatory Visit: Payer: Self-pay

## 2019-04-25 ENCOUNTER — Ambulatory Visit: Payer: Medicare Other | Attending: Family | Admitting: Family

## 2019-04-25 ENCOUNTER — Encounter: Payer: Self-pay | Admitting: Family

## 2019-04-25 VITALS — BP 134/44 | HR 99 | Resp 16 | Ht 71.0 in | Wt 282.2 lb

## 2019-04-25 DIAGNOSIS — I890 Lymphedema, not elsewhere classified: Secondary | ICD-10-CM | POA: Insufficient documentation

## 2019-04-25 DIAGNOSIS — M25562 Pain in left knee: Secondary | ICD-10-CM | POA: Insufficient documentation

## 2019-04-25 DIAGNOSIS — E669 Obesity, unspecified: Secondary | ICD-10-CM | POA: Insufficient documentation

## 2019-04-25 DIAGNOSIS — R5383 Other fatigue: Secondary | ICD-10-CM | POA: Insufficient documentation

## 2019-04-25 DIAGNOSIS — E785 Hyperlipidemia, unspecified: Secondary | ICD-10-CM | POA: Diagnosis not present

## 2019-04-25 DIAGNOSIS — M1990 Unspecified osteoarthritis, unspecified site: Secondary | ICD-10-CM | POA: Diagnosis not present

## 2019-04-25 DIAGNOSIS — I350 Nonrheumatic aortic (valve) stenosis: Secondary | ICD-10-CM | POA: Diagnosis not present

## 2019-04-25 DIAGNOSIS — Z79899 Other long term (current) drug therapy: Secondary | ICD-10-CM | POA: Diagnosis not present

## 2019-04-25 DIAGNOSIS — I10 Essential (primary) hypertension: Secondary | ICD-10-CM

## 2019-04-25 DIAGNOSIS — I110 Hypertensive heart disease with heart failure: Secondary | ICD-10-CM | POA: Insufficient documentation

## 2019-04-25 DIAGNOSIS — Z7902 Long term (current) use of antithrombotics/antiplatelets: Secondary | ICD-10-CM | POA: Diagnosis not present

## 2019-04-25 DIAGNOSIS — R0602 Shortness of breath: Secondary | ICD-10-CM | POA: Diagnosis not present

## 2019-04-25 DIAGNOSIS — I2510 Atherosclerotic heart disease of native coronary artery without angina pectoris: Secondary | ICD-10-CM | POA: Insufficient documentation

## 2019-04-25 DIAGNOSIS — Z87891 Personal history of nicotine dependence: Secondary | ICD-10-CM | POA: Diagnosis not present

## 2019-04-25 DIAGNOSIS — I5032 Chronic diastolic (congestive) heart failure: Secondary | ICD-10-CM | POA: Insufficient documentation

## 2019-04-25 DIAGNOSIS — J45909 Unspecified asthma, uncomplicated: Secondary | ICD-10-CM | POA: Insufficient documentation

## 2019-04-25 DIAGNOSIS — Z8249 Family history of ischemic heart disease and other diseases of the circulatory system: Secondary | ICD-10-CM | POA: Diagnosis not present

## 2019-04-25 NOTE — Patient Instructions (Addendum)
Continue weighing daily and call for an overnight weight gain of > 2 pounds or a weekly weight gain of >5 pounds.  Call anytime to make another appointment with [place]

## 2019-04-30 NOTE — Telephone Encounter (Signed)
  [facility]   MULTIDISCIPLINARY HEART VALVE TEAM  I spoke with the pt and he is still thinking about whether he would like to proceed with TAVR evaluation.  The pt is [facility] and has difficulty hearing on the phone.  He asked me to contact his brother [name] to relay the message. I contacted Joe and he said that the pt is still thinking about things but is nervous about having any procedures.  I advised Joe that we can arrange another apt if needed to answer any questions or explain the testing and surgery again if needed.  [name] will speak with his brother and I will touch base with them again in January. Joe agreed with plan.

## 2019-06-07 ENCOUNTER — Telehealth: Payer: Self-pay

## 2019-06-07 NOTE — Telephone Encounter (Signed)
  [facility]   MULTIDISCIPLINARY HEART VALVE TEAM   I spoke with the pt's brother [name] and the pt has decided that he does not wish to proceed with evaluation for TAVR.  I advised Joe that his the pt changes his mind or would like to speak with our team again then he can feel free to contact me.  Joe agreed. I will forward this message to Dr Mariah Milling the pt's primary cardiologist.

## 2019-06-25 ENCOUNTER — Other Ambulatory Visit: Payer: Self-pay | Admitting: Family Medicine

## 2019-09-09 ENCOUNTER — Other Ambulatory Visit: Payer: Self-pay | Admitting: Family

## 2019-09-17 ENCOUNTER — Other Ambulatory Visit: Payer: Self-pay | Admitting: Family

## 2019-09-17 MED ORDER — POTASSIUM CHLORIDE ER 10 MEQ PO TBCR: 10.0000 meq | EXTENDED_RELEASE_TABLET | Freq: Two times a day (BID) | ORAL | 0 refills | Status: DC

## 2019-11-08 ENCOUNTER — Ambulatory Visit (INDEPENDENT_AMBULATORY_CARE_PROVIDER_SITE_OTHER): Payer: Medicare Other

## 2019-11-08 VITALS — Ht 71.0 in | Wt 282.0 lb

## 2019-11-08 DIAGNOSIS — Z0000 Encounter for general adult medical examination without abnormal findings: Secondary | ICD-10-CM | POA: Diagnosis not present

## 2019-11-08 NOTE — Progress Notes (Signed)
Subjective:   [name] is a 86 y.o. male who presents for Medicare Annual/Subsequent preventive examination.  Review of Systems    No ROS.  Medicare Wellness Virtual Visit.     Cardiac Risk Factors include: advanced age (>74men, >31 women);male gender;diabetes mellitus;hypertension     Objective:    Today's Vitals   11/08/19 0936  Weight: 282 lb (127.9 kg)  Height: 5\' 11"  (1.803 m)   Body mass index is 39.33 kg/m.  Advanced Directives 11/08/2019 11/07/2018 03/16/2018 03/16/2018 09/27/2017 08/07/2017 08/06/2017  Does Patient Have a Medical Advance Directive? No Yes No No No No No  Type of Advance Directive - [place]  Does patient want to make changes to medical advance directive? - No - Patient declined - - - - -  Copy of [place] in Chart? - No - copy requested - - - - -  Would patient like information on creating a medical advance directive? No - Patient declined - No - Patient declined No - Patient declined No - Patient declined No - Patient declined -    Current Medications (verified) Outpatient Encounter Medications as of 11/08/2019  Medication Sig  . atorvastatin (LIPITOR) 80 MG tablet TAKE 1 TABLET BY MOUTH DAILY  . clopidogrel (PLAVIX) 75 MG tablet TAKE 1 TABLET BY MOUTH DAILY  . ferrous sulfate 325 (65 FE) MG EC tablet Take 1 tablet (325 mg total) by mouth 2 (two) times daily.  . hydrocerin (EUCERIN) CREA Apply 1 application topically 2 (two) times daily. Apply to bilateral LEs and feet twice daily after cleansing with house skin cleanser and gently patting dry. No not apply between toes.  Marland Kitchen losartan (COZAAR) 25 MG tablet TAKE 1 TABLET(25 MG) BY MOUTH DAILY  . metoprolol succinate (TOPROL-XL) 25 MG 24 hr tablet TAKE 1 TABLET BY MOUTH EVERY [place]  . potassium chloride (KLOR-CON) 10 MEQ tablet Take 1 tablet (10 mEq total) by mouth 2 (two) times daily. MUST MAKE APPOINTMENT FOR FURTHER REFILLS  . torsemide (DEMADEX) 20 MG  tablet Take 2 tablets (40 mg total) by mouth 2 (two) times daily.   No facility-administered encounter medications on file as of 11/08/2019.    Allergies (verified) Aspirin   History: Past Medical History:  Diagnosis Date  . (HFpEF) heart failure with preserved ejection fraction ([place])    a. 03/2018 Echo: EF 60-65%, Gr1 DD.  Marland Kitchen Aortic atherosclerosis ([place]) 05/2013   Per TEE  . Arrhythmia   . Asthma   . CHF (congestive heart failure) ([place])   . Colon polyp   . Coronary artery disease    a. Nonobstructive by 2004 cath b. 05/2013 NSTEMI/Cath: >M 30, LAD 69m, LCX 11m, RCA 30p, EF 60%, ? Sev AS (mod by TEE).  Marland Kitchen GERD (gastroesophageal reflux disease)   . Heart murmur   . Hiatal hernia   . Hyperlipidemia   . Hypertension   . Lymphedema   . Mitral regurgitation    a. 03/2018 Echo: Mild to mod MR.  . Obesity   . Onychomycosis   . Osteoarthritis   . Recurrent cellulitis of lower extremity   . Severe aortic stenosis    a. 2013 Echo: EF 55%, mod AS; b. TEE 05/2013: EF EF 60-65%, mod AS; c.10/2014 Echo: 55-60%, mod AS; d. 10/2016 Echo: EF 65-70%, Mod-Sev AS; e. 03/2018 Ehco: EF 60-65%, Sev AS, mild to mod AI. Mean grad (S) 20mmHg, Valve Area (VTI): 0.92cm^2.   Past Surgical  History:  Procedure Laterality Date  . CARDIAC CATHETERIZATION  11/2002  . CARDIAC CATHETERIZATION  05/21/2013   showing 30% oLM, 30% D1, 20% mLCx, 30% pRCA; EF 60%, severe AS (mean gradient 28 mmHg, peak 26, area 0.9)  . CHOLECYSTECTOMY  2001  . TRANSESOPHAGEAL ECHOCARDIOGRAM  05/22/2013   Preserved EF, moderate AS (valve area by planimetry between 1.0-1.26 cm sq), moderate aortic arch and descending aortic atherosclerosis.   . TRANSTHORACIC ECHOCARDIOGRAM  05/19/2013   EF 60-65%, impaired diastolic function, mild LA dilatation, mild MR/AI/TR, mod AS, high normal RVSP (30.4 mmHg)   Family History  Problem Relation Age of Onset  . Heart disease Mother   . Heart disease Father 90  . Stroke Brother    Social History    Socioeconomic History  . Marital status: Widowed    Spouse name: Not on file  . Number of children: Not on file  . Years of education: Not on file  . Highest education level: Not on file  Occupational History  . Occupation: Retired Agricultural engineer     Comment: Acupuncturist  Tobacco Use  . Smoking status: Former Smoker    Packs/[place]: 1.50    Years: 20.00    Pack years: 30.00    Types: Cigarettes    Quit date: 02/22/1981    Years since quitting: 38.7  . Smokeless tobacco: Never Used  Vaping Use  . Vaping Use: Never used  Substance and Sexual Activity  . Alcohol use: No  . Drug use: No  . Sexual activity: Not Currently  Other Topics Concern  . Not on file  Social History Narrative   Mr. Cerveny lives in [place] with his daughter. Wife passed away a few months ago. They have a son and daughter. He is retired from [employer]. He enjoys fishing.   Social Determinants of Health   Financial Resource Strain:   . Difficulty of Paying Living Expenses:   Food Insecurity:   . Worried About Programme researcher, broadcasting/film/video in the Last Year:   . Barista in the Last Year:   Transportation Needs:   . Freight forwarder (Medical):   Marland Kitchen Lack of Transportation (Non-Medical):   Physical Activity:   . Days of Exercise per Week:   . Minutes of Exercise per Session:   Stress:   . Feeling of Stress :   Social Connections:   . Frequency of Communication with Friends and Family:   . Frequency of Social Gatherings with Friends and Family:   . Attends Religious Services:   . Active Member of Clubs or Organizations:   . Attends Banker Meetings:   Marland Kitchen Marital Status:     Tobacco Counseling Counseling given: Not Answered   Clinical Intake:  Pre-visit preparation completed: Yes        Diabetes: Yes (Followed by pcp)  How often do you need to have someone help you when you read instructions, pamphlets, or other written materials from your doctor or  pharmacy?: 3 - Sometimes   Interpreter Needed?: No      Activities of Daily Living In your present state of health, do you have any difficulty performing the following activities: 11/08/2019 04/25/2019  Hearing? N Y  Vision? N Y  Difficulty concentrating or making decisions? N Y  Walking or climbing stairs? N Y  Dressing or bathing? N Y  Doing errands, shopping? Y N  Preparing Food and eating ? Y -  Comment Meals prepared  by family. Self feeds. -  Using the Toilet? N -  In the past six months, have you accidently leaked urine? N -  Do you have problems with loss of bowel control? N -  Managing your Medications? Y -  Comment Family managed. -  Managing your Finances? Y -  Comment Family managed. -  Housekeeping or managing your Housekeeping? Y -  Comment Family assist. -  Some recent data might be hidden    Patient Care Team: Glori Luis, MD as PCP - General (Family Medicine) Mariah Milling, Tollie Pizza, MD as PCP - Cardiology (Cardiology) Delma Freeze, FNP as Nurse Practitioner (Family Medicine)  Indicate any recent Medical Services you may have received from other than [facility] providers in the past year (date may be approximate).     Assessment:   This is a routine wellness examination for [place].  I connected with [place] today by telephone and verified that I am speaking with the correct person using two identifiers. Location patient: home Location provider: work Persons participating in the virtual visit: patient, Engineer, civil (consulting).    I discussed the limitations, risks, security and privacy concerns of performing an evaluation and management service by telephone and the availability of in person appointments. The patient expressed understanding and verbally consented to this telephonic visit.    Interactive audio and video telecommunications were attempted between this provider and patient, however failed, due to patient having technical difficulties OR patient did not have access  to video capability.  We continued and completed visit with audio only.  Some vital signs may be absent or patient reported.   Hearing/Vision screen  Hearing Screening   125Hz  250Hz  500Hz  1000Hz  2000Hz  3000Hz  4000Hz  6000Hz  8000Hz   Right ear:           Left ear:           Comments: Patient is able to hear conversational tones without difficulty.  No issues reported.  Vision Screening Comments: Wears corrective lenses Visual acuity not assessed, virtual visit.     Dietary issues and exercise activities discussed: Current Exercise Habits: Home exercise routine, Type of exercise: walking, Intensity: Mild  Goals    . Healthy Lifestyle     Stay hydrated Healthy diet Walk as tolerated      Depression Screen PHQ 2/9 Scores 11/08/2019 11/07/2018 12/26/2017 10/26/2017 10/19/2017 10/13/2017 09/15/2017  PHQ - 2 Score 1 0 0 1 0 0 0  PHQ- 9 Score - - - - - - -    Fall Risk Fall Risk  11/08/2019 04/25/2019 01/08/2019 12/01/2018 11/07/2018  Falls in the past year? 0 0 0 0 0  Number falls in past yr: - 0 0 0 -  Injury with Fall? - 0 0 0 -  Risk for fall due to : Impaired balance/gait - - - -  Follow up Falls evaluation completed Falls evaluation completed Falls evaluation completed - -   Handrails in use when using stairs? Yes  Home free of loose throw rugs in walkways, pet beds, electrical cords, etc? Yes  Adequate lighting in your home to reduce risk of falls? Yes   ASSISTIVE DEVICES UTILIZED TO PREVENT FALLS:  Life alert? No  Use of a cane, walker or w/c? Yes  Grab bars in the bathroom? Yes   TIMED UP AND GO:  Was the test performed? No . Virtual visit  Cognitive Function: MMSE - Mini Mental State Exam 04/05/2017  Not completed: Refused     6CIT Screen 11/08/2019 11/07/2018  What Year? 4 points 0 points  What month? 3 points 0 points  What time? 0 points 0 points  Count back from 20 0 points 0 points  Months in reverse 4 points 4 points  Patient is alert.  Correctly identified the  president of the [place], season and recall 2/5. Patient likes to read the newspaper for brain stimulation  Immunizations Immunization History  Administered Date(s) Administered  . Influenza Split 02/24/2015  . Influenza, High Dose Seasonal PF 01/21/2016, 03/02/2017, 03/24/2018  . Influenza-Unspecified 01/15/2013, 01/21/2016, 03/02/2017  . Pneumococcal Polysaccharide-23 08/28/2018   TDAP status: Due, Education has been provided regarding the importance of this vaccine. Advised may receive this vaccine at local pharmacy or Health Dept. Aware to provide a copy of the vaccination record if obtained from local pharmacy or Health Dept. Verbalized acceptance and understanding.  Deferred per patient preference.   Covid-19 vaccine status: Declined, Education has been provided regarding the importance of this vaccine but patient still declined. Advised may receive this vaccine at local pharmacy or Health Dept.or vaccine clinic. Aware to provide a copy of the vaccination record if obtained from local pharmacy or Health Dept. Verbalized acceptance and understanding.  Deferred per patient preference.  Zostavax completed No   Shingrix Completed?: No.    Education has been provided regarding the importance of this vaccine. Patient has been advised to call insurance company to determine out of pocket expense if they have not yet received this vaccine. Advised may also receive vaccine at local pharmacy or Health Dept. Verbalized acceptance and understanding.  Deferred per patient preference.  Health Maintenance Health Maintenance  Topic Date Due  . OPHTHALMOLOGY EXAM  Never done  . FOOT EXAM  01/20/2017  . HEMOGLOBIN A1C  03/30/2018  . PNA vac Low Risk Adult (2 of 2 - PCV13) 08/28/2019  . COVID-19 Vaccine (1) 11/24/2019 (Originally 12/11/1942)  . TETANUS/TDAP  11/07/2020 (Originally 12/12/1949)  . INFLUENZA VACCINE  12/16/2019   Ophthalmology exam- patient states less than 1 year ago. Agrees to bring updated  information to next scheduled visit.   Foot exam- patient notes no change in feet. Followed by pcp.   A1c- labs followed by pcp.  Prevnar 13 vaccine- deferred due to patient preference.   Colorectal cancer screening: No longer required.    Dental Screening: Recommended annual dental exams for proper oral hygiene  Community Resource Referral / Chronic Care Management: CRR required this visit?  No  CCM required this visit?  No    Plan:   Encouraged to schedule routine follow up with PMD. Agrees to schedule soon.   I have personally reviewed and noted the following in the patient's chart:   . Medical and social history . Use of alcohol, tobacco or illicit drugs  . Current medications and supplements . Functional ability and status . Nutritional status . Physical activity . Advanced directives . List of other physicians . Hospitalizations, surgeries, and ER visits in previous 12 months . Vitals . Screenings to include cognitive, depression, and falls . Referrals and appointments  In addition, I have reviewed and discussed with patient certain preventive protocols, quality metrics, and best practice recommendations. A written personalized care plan for preventive services as well as general preventive health recommendations were provided to patient via mail.     Ashok Pall, LPN   12/03/9468

## 2019-11-08 NOTE — Patient Instructions (Addendum)
[name] , Thank you for taking time to come for your Medicare Wellness Visit. I appreciate your ongoing commitment to your health goals. Please review the following plan we discussed and let me know if I can assist you in the future.   These are the goals we discussed: Goals    . Healthy Lifestyle     Stay hydrated Healthy diet Walk as tolerated       This is a list of the screening recommended for you and due dates:  Health Maintenance  Topic Date Due  . Eye exam for diabetics  Never done  . Complete foot exam   01/20/2017  . Hemoglobin A1C  03/30/2018  . Pneumonia vaccines (2 of 2 - PCV13) 08/28/2019  . COVID-19 Vaccine (1) 11/24/2019*  . Tetanus Vaccine  11/07/2020*  . Flu Shot  12/16/2019  *Topic was postponed. The date shown is not the original due date.    Encouraged to schedule routine follow up with PMD.  Agrees to schedule soon.   Advanced directives: End of life planning; Advance aging; Advanced directives discussed.  Copy of current HCPOA/Living Will requested.    Follow up in one year for your annual wellness visit.   Preventive Care 9 Years and Older, Male Preventive care refers to lifestyle choices and visits with your health care provider that can promote health and wellness. What does preventive care include?  A yearly physical exam. This is also called an annual well check.  Dental exams once or twice a year.  Routine eye exams. Ask your health care provider how often you should have your eyes checked.  Personal lifestyle choices, including:  Daily care of your teeth and gums.  Regular physical activity.  Eating a healthy diet.  Avoiding tobacco and drug use.  Limiting alcohol use.  Practicing safe sex.  Taking low doses of aspirin every Toda.  Taking vitamin and mineral supplements as recommended by your health care provider. What happens during an annual well check? The services and screenings done by your health care provider during your  annual well check will depend on your age, overall health, lifestyle risk factors, and family history of disease. Counseling  Your health care provider may ask you questions about your:  Alcohol use.  Tobacco use.  Drug use.  Emotional well-being.  Home and relationship well-being.  Sexual activity.  Eating habits.  History of falls.  Memory and ability to understand (cognition).  Work and work Statistician. Screening  You may have the following tests or measurements:  Height, weight, and BMI.  Blood pressure.  Lipid and cholesterol levels. These may be checked every 5 years, or more frequently if you are over 70 years old.  Skin check.  Lung cancer screening. You may have this screening every year starting at age 32 if you have a 30-pack-year history of smoking and currently smoke or have quit within the past 15 years.  Fecal occult blood test (FOBT) of the stool. You may have this test every year starting at age 86.  Flexible sigmoidoscopy or colonoscopy. You may have a sigmoidoscopy every 5 years or a colonoscopy every 10 years starting at age 41.  Prostate cancer screening. Recommendations will vary depending on your family history and other risks.  Hepatitis C blood test.  Hepatitis B blood test.  Sexually transmitted disease (STD) testing.  Diabetes screening. This is done by checking your blood sugar (glucose) after you have not eaten for a while (fasting). You may have  this done every 1-3 years.  Abdominal aortic aneurysm (AAA) screening. You may need this if you are a current or former smoker.  Osteoporosis. You may be screened starting at age 45 if you are at high risk. Talk with your health care provider about your test results, treatment options, and if necessary, the need for more tests. Vaccines  Your health care provider may recommend certain vaccines, such as:  Influenza vaccine. This is recommended every year.  Tetanus, diphtheria, and  acellular pertussis (Tdap, Td) vaccine. You may need a Td booster every 10 years.  Zoster vaccine. You may need this after age 69.  Pneumococcal 13-valent conjugate (PCV13) vaccine. One dose is recommended after age 59.  Pneumococcal polysaccharide (PPSV23) vaccine. One dose is recommended after age 70. Talk to your health care provider about which screenings and vaccines you need and how often you need them. This information is not intended to replace advice given to you by your health care provider. Make sure you discuss any questions you have with your health care provider. Document Released: 05/30/2015 Document Revised: 01/21/2016 Document Reviewed: 03/04/2015 Elsevier Interactive Patient Education  2017 [employer].  Fall Prevention in the Home Falls can cause injuries. They can happen to people of all ages. There are many things you can do to make your home safe and to help prevent falls. What can I do on the outside of my home?  Regularly fix the edges of walkways and driveways and fix any cracks.  Remove anything that might make you trip as you walk through a door, such as a raised step or threshold.  Trim any bushes or trees on the path to your home.  Use bright outdoor lighting.  Clear any walking paths of anything that might make someone trip, such as rocks or tools.  Regularly check to see if handrails are loose or broken. Make sure that both sides of any steps have handrails.  Any raised decks and porches should have guardrails on the edges.  Have any leaves, snow, or ice cleared regularly.  Use sand or salt on walking paths during winter.  Clean up any spills in your garage right away. This includes oil or grease spills. What can I do in the bathroom?  Use night lights.  Install grab bars by the toilet and in the tub and shower. Do not use towel bars as grab bars.  Use non-skid mats or decals in the tub or shower.  If you need to sit down in the shower, use  a plastic, non-slip stool.  Keep the floor dry. Clean up any water that spills on the floor as soon as it happens.  Remove soap buildup in the tub or shower regularly.  Attach bath mats securely with double-sided non-slip rug tape.  Do not have throw rugs and other things on the floor that can make you trip. What can I do in the bedroom?  Use night lights.  Make sure that you have a light by your bed that is easy to reach.  Do not use any sheets or blankets that are too big for your bed. They should not hang down onto the floor.  Have a firm chair that has side arms. You can use this for support while you get dressed.  Do not have throw rugs and other things on the floor that can make you trip. What can I do in the kitchen?  Clean up any spills right away.  Avoid walking on wet floors.  Keep items that you use a lot in easy-to-reach places.  If you need to reach something above you, use a strong step stool that has a grab bar.  Keep electrical cords out of the way.  Do not use floor polish or wax that makes floors slippery. If you must use wax, use non-skid floor wax.  Do not have throw rugs and other things on the floor that can make you trip. What can I do with my stairs?  Do not leave any items on the stairs.  Make sure that there are handrails on both sides of the stairs and use them. Fix handrails that are broken or loose. Make sure that handrails are as long as the stairways.  Check any carpeting to make sure that it is firmly attached to the stairs. Fix any carpet that is loose or worn.  Avoid having throw rugs at the top or bottom of the stairs. If you do have throw rugs, attach them to the floor with carpet tape.  Make sure that you have a light switch at the top of the stairs and the bottom of the stairs. If you do not have them, ask someone to add them for you. What else can I do to help prevent falls?  Wear shoes that:  Do not have high heels.  Have  rubber bottoms.  Are comfortable and fit you well.  Are closed at the toe. Do not wear sandals.  If you use a stepladder:  Make sure that it is fully opened. Do not climb a closed stepladder.  Make sure that both sides of the stepladder are locked into place.  Ask someone to hold it for you, if possible.  Clearly mark and make sure that you can see:  Any grab bars or handrails.  First and last steps.  Where the edge of each step is.  Use tools that help you move around (mobility aids) if they are needed. These include:  Canes.  Walkers.  Scooters.  Crutches.  Turn on the lights when you go into a dark area. Replace any light bulbs as soon as they burn out.  Set up your furniture so you have a clear path. Avoid moving your furniture around.  If any of your floors are uneven, fix them.  If there are any pets around you, be aware of where they are.  Review your medicines with your doctor. Some medicines can make you feel dizzy. This can increase your chance of falling. Ask your doctor what other things that you can do to help prevent falls. This information is not intended to replace advice given to you by your health care provider. Make sure you discuss any questions you have with your health care provider. Document Released: 02/27/2009 Document Revised: 10/09/2015 Document Reviewed: 06/07/2014 Elsevier Interactive Patient Education  2017 [employer].

## 2019-11-08 NOTE — Progress Notes (Signed)
I have reviewed the above note and agree.  Hershel Corkery, M.D.  

## 2019-11-26 IMAGING — CT CT HEAD W/O CM
3 of 4 series · 15 of 47 positions shown, 18 images · non-contrast
Comparison: May 26, 2014

CLINICAL DATA: Headache, primarily frontal

EXAM:
CT HEAD WITHOUT CONTRAST
TECHNIQUE: Contiguous axial images were obtained from the base of the skull
through the vertex without intravenous contrast.

[Series 2: head wo · axial · 0.47mm/px · z∈[-134,-19]mm · 9 of 29 slices shown, 12 images]
[im 3/29  brain]
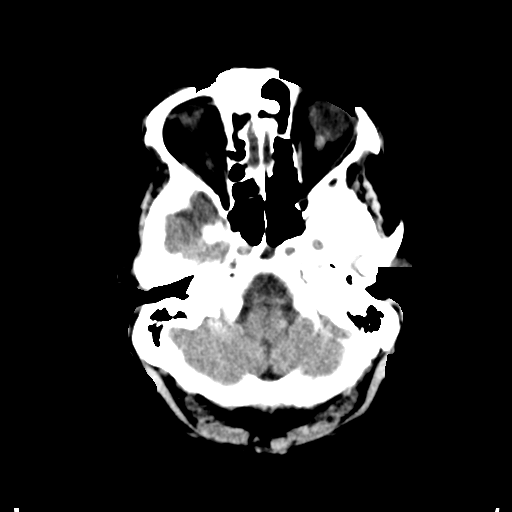
[im 3/29  bone]
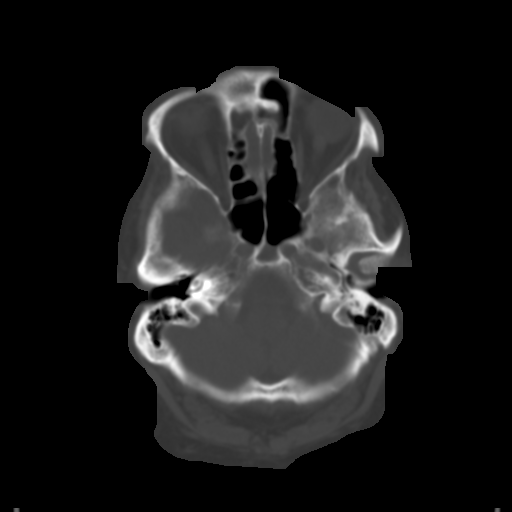
[im 6/29  brain]
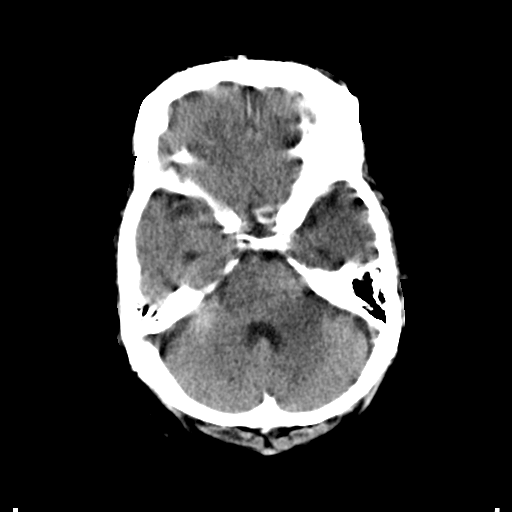
[im 9/29  brain]
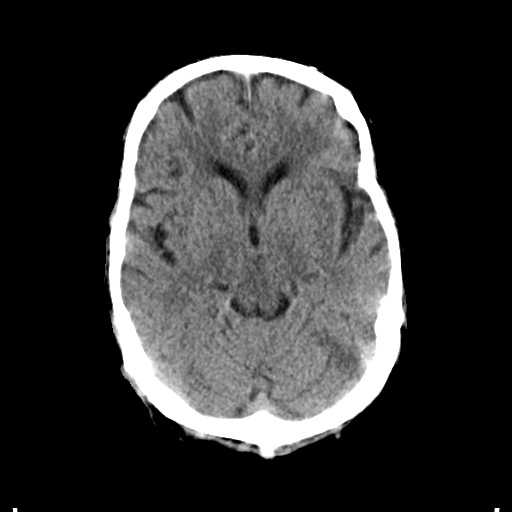
[im 12/29  brain]
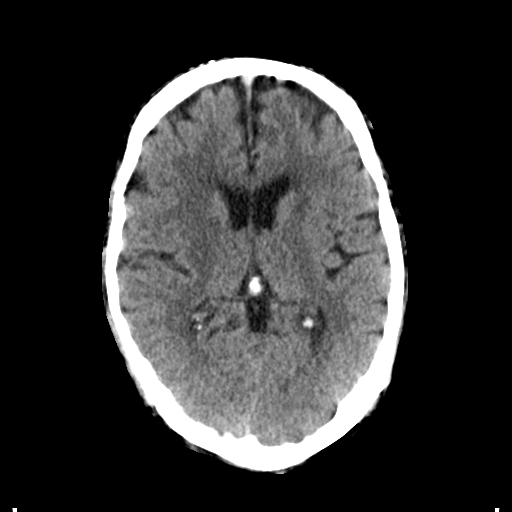
[im 15/29  brain]
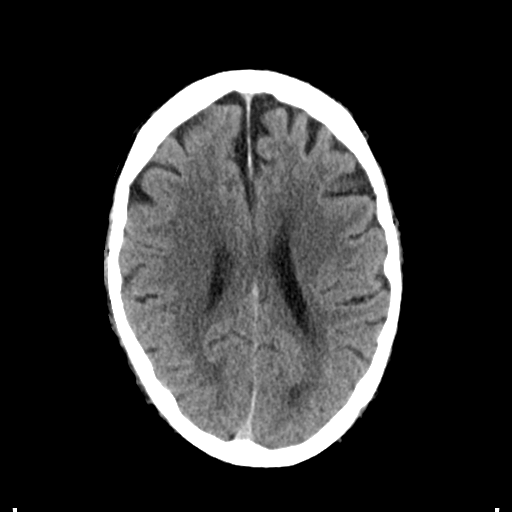
[im 15/29  bone]
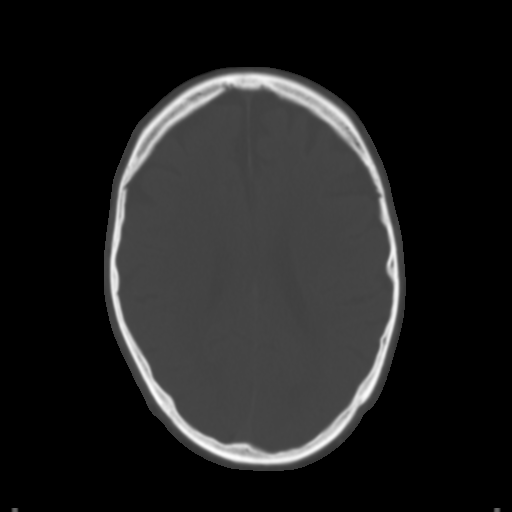
[im 17/29  brain]
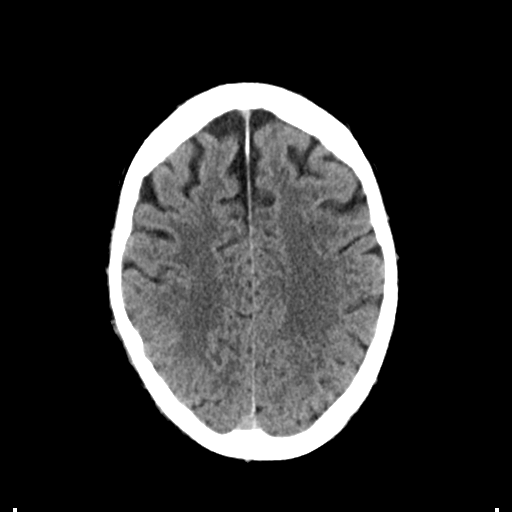
[im 20/29  brain]
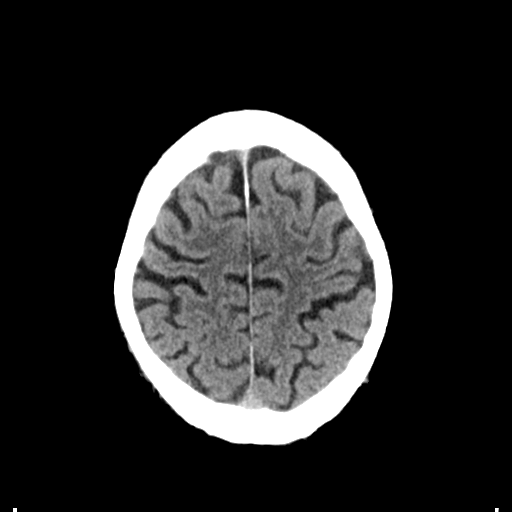
[im 23/29  brain]
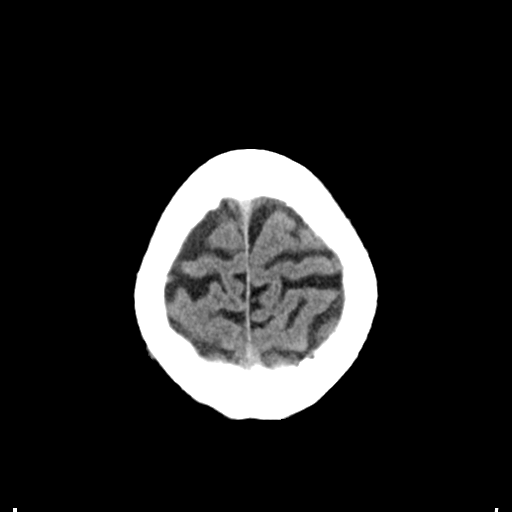
[im 26/29  brain]
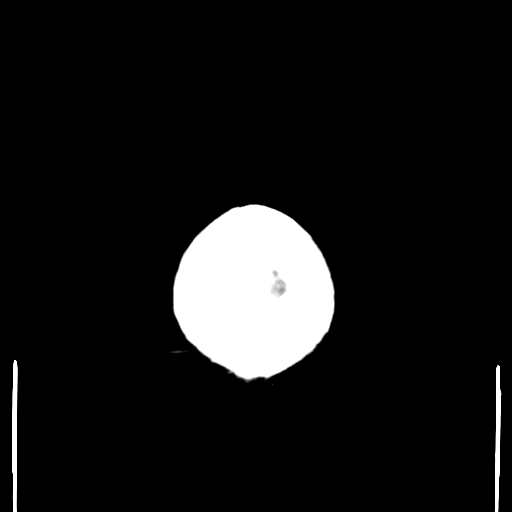
[im 26/29  bone]
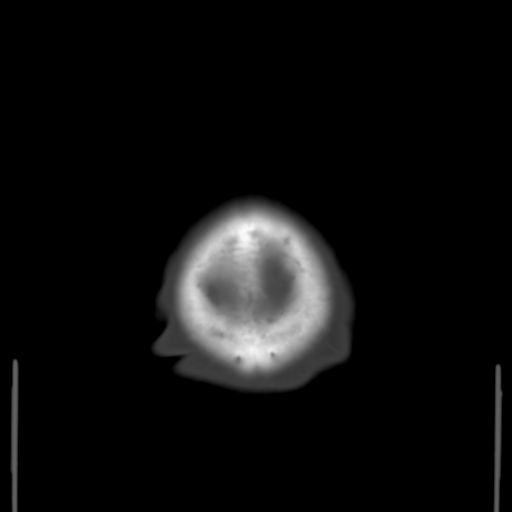

[Series 4: coronal soft tissue · coronal · 0.29mm/px · 3 of 69 slices shown]
[im 23/69  brain]
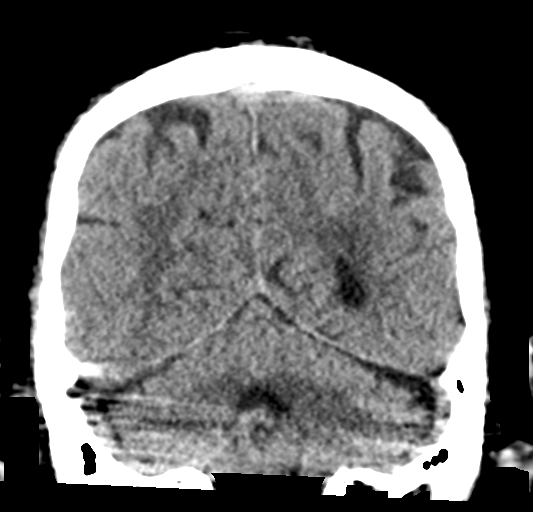
[im 31/69  brain]
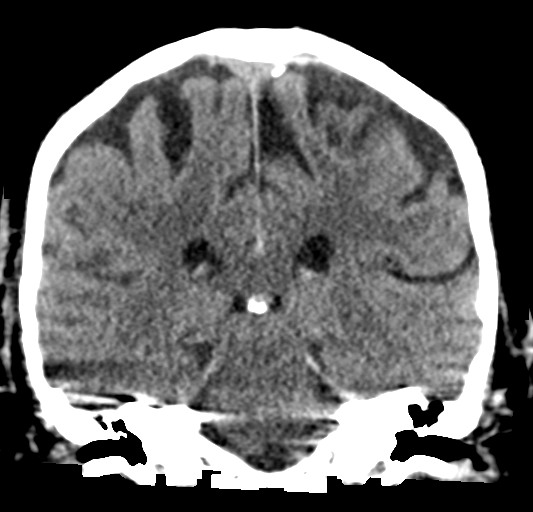
[im 38/69  brain]
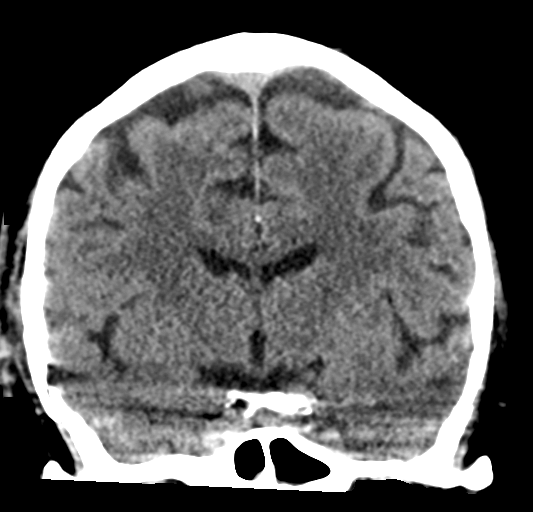

[Series 5: sagittal soft tissue · sagittal · 0.29mm/px · 3 of 54 slices shown]
[im 18/54  brain]
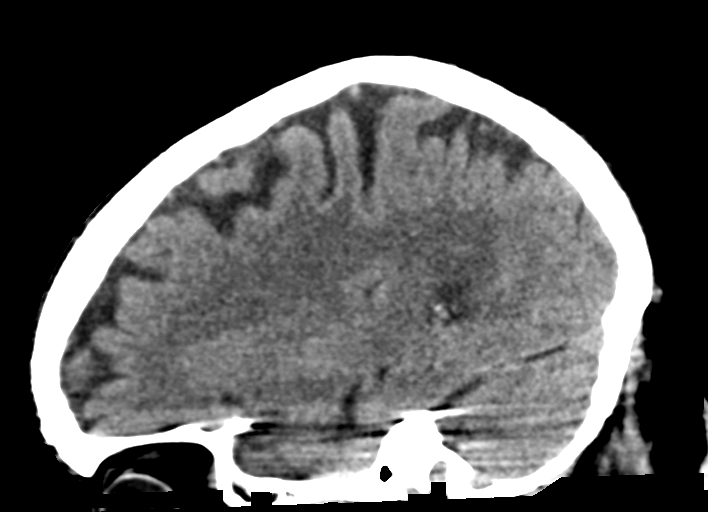
[im 27/54  brain]
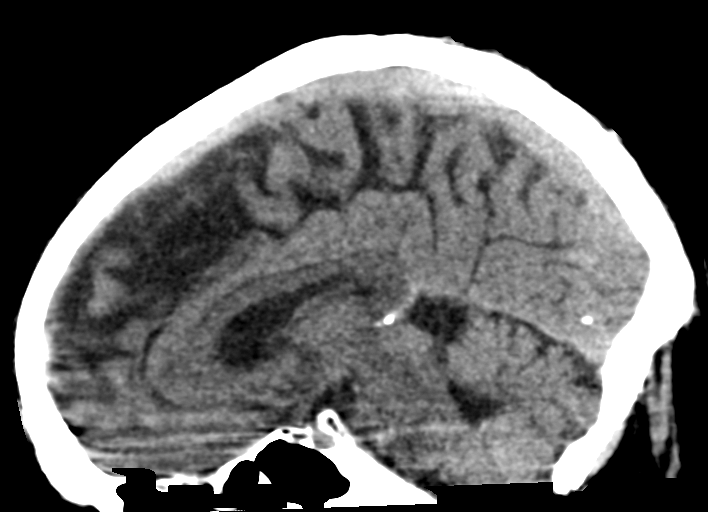
[im 36/54  brain]
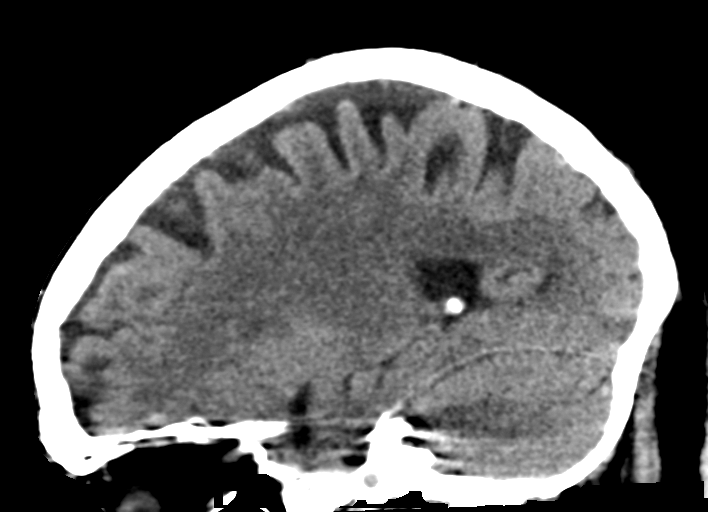

[15 of 47 positions shown; findings below may reference images not displayed]

FINDINGS: Brain: There is age related volume loss. There is no intracranial
mass, hemorrhage, extra-axial fluid collection, or midline shift.
There is slight small vessel disease in the centra semiovale
bilaterally. Elsewhere gray-white compartments appear normal.

Vascular: No evident hyperdense vessel. There is calcification in
each carotid siphon region.

Skull: Bony calvarium appears intact.

Sinuses/Orbits: There is mucosal thickening in both sphenoid
sinuses, more on the left than on the right. There is opacification
in several ethmoid air cells bilaterally. The patient has had
surgery in each ethmoid air cell region. Visualized orbits appear
symmetric bilaterally.

Other: Visualized mastoid air cells are clear.
IMPRESSION: Age related volume loss with slight periventricular small vessel
disease. No acute infarct evident. No mass or hemorrhage.

Foci of arterial vascular calcification noted. Postoperative change
noted in the ethmoid air cells. There are areas of paranasal sinus
disease, most notably in the left sphenoid sinus posteriorly.

There are foci of arterial vascular calcification evident.

## 2019-11-28 ENCOUNTER — Encounter: Payer: Self-pay | Admitting: Family Medicine

## 2019-11-28 ENCOUNTER — Ambulatory Visit (INDEPENDENT_AMBULATORY_CARE_PROVIDER_SITE_OTHER): Payer: Medicare Other | Admitting: Family Medicine

## 2019-11-28 ENCOUNTER — Other Ambulatory Visit: Payer: Self-pay

## 2019-11-28 VITALS — BP 160/69 | HR 95 | Temp 98.9°F | Ht 71.0 in | Wt 298.2 lb

## 2019-11-28 DIAGNOSIS — E1159 Type 2 diabetes mellitus with other circulatory complications: Secondary | ICD-10-CM | POA: Diagnosis not present

## 2019-11-28 DIAGNOSIS — L03115 Cellulitis of right lower limb: Secondary | ICD-10-CM

## 2019-11-28 DIAGNOSIS — I10 Essential (primary) hypertension: Secondary | ICD-10-CM

## 2019-11-28 DIAGNOSIS — S81801A Unspecified open wound, right lower leg, initial encounter: Secondary | ICD-10-CM | POA: Diagnosis not present

## 2019-11-28 DIAGNOSIS — E782 Mixed hyperlipidemia: Secondary | ICD-10-CM | POA: Diagnosis not present

## 2019-11-28 MED ORDER — DOXYCYCLINE HYCLATE 100 MG PO TABS: 100.0000 mg | ORAL_TABLET | Freq: Two times a day (BID) | ORAL | 0 refills | Status: DC

## 2019-11-28 NOTE — Assessment & Plan Note (Signed)
Systolic blood pressure elevated today though has been borderline low on last check.  Working to see him back early next week and we will see what his blood pressure is then prior to making any medication changes.  Lab work today.

## 2019-11-28 NOTE — Progress Notes (Signed)
  Tommi Rumps, MD Phone: [phone]  [name] is a 86 y.o. male who presents today for same-Enz[facility] visit.  Right lower leg wound/cellulitis: This is been present for months per the patient.  His friend states that it has gotten a little bit worse recently.  There is surrounding redness.  There is no pain.  No fever.  It has been draining.  Appears to have been a blister at some point.  Hypertension: Not checking blood pressures.  Taking losartan, metoprolol, and torsemide.  No chest pain, shortness of breath, or edema.  Patient is overdue for lab work due to poor follow-up.  Social History   Tobacco Use  Smoking Status Former Smoker  . Packs/Heyden: 1.50  . Years: 20.00  . Pack years: 30.00  . Types: Cigarettes  . Quit date: 02/22/1981  . Years since quitting: 38.7  Smokeless Tobacco Never Used     ROS see history of present illness  Objective  Physical Exam Vitals:   11/28/19 1512  BP: (!) 160/69  Pulse: 95  Temp: 98.9 F (37.2 C)  SpO2: 94%    BP Readings from Last 3 Encounters:  11/28/19 (!) 160/69  04/25/19 (!) 134/44  03/12/19 (!) 172/52   Wt Readings from Last 3 Encounters:  11/28/19 298 lb 3.2 oz (135.3 kg)  11/08/19 282 lb (127.9 kg)  04/25/19 282 lb 3.2 oz (128 kg)    Physical Exam  Right lower extremity, ulceration, drainage of some serous versus purulent material, there is surrounding erythema and induration, there is no fluctuance noted   Assessment/Plan: Please see individual problem list.  Hypertension Systolic blood pressure elevated today though has been borderline low on last check.  Working to see him back early next week and we will see what his blood pressure is then prior to making any medication changes.  Lab work today.  Cellulitis Patient with a nonhealing wound from a prior blister.  Appears to have surrounding cellulitis.  Will start on doxycycline.  We are referring to wound care.  Given return precautions.  Follow-up was  offered for Friday or next Monday and they opted for Monday.   Hyperlipidemia Check lipid panel.  Diabetes mellitus (HCC) Check A1c.  Orders Placed This Encounter  Procedures  . Comp Met (CMET)  . Lipid panel  . CBC w/Diff  . HgB A1c  . AMB referral to wound care center    Referral Priority:   Routine    Referral Type:   Consultation    Number of Visits Requested:   1    Meds ordered this encounter  Medications  . doxycycline (VIBRA-TABS) 100 MG tablet    Sig: Take 1 tablet (100 mg total) by mouth 2 (two) times daily.    Dispense:  14 tablet    Refill:  0    This visit occurred during the SARS-CoV-2 public health emergency.  Safety protocols were in place, including screening questions prior to the visit, additional usage of staff PPE, and extensive cleaning of exam room while observing appropriate contact time as indicated for disinfecting solutions.    Tommi Rumps, MD [place]

## 2019-11-28 NOTE — Assessment & Plan Note (Signed)
Check A1c. 

## 2019-11-28 NOTE — Assessment & Plan Note (Signed)
Check lipid panel  

## 2019-11-28 NOTE — Patient Instructions (Signed)
Nice to see you. Please take the antibiotic for the skin infection.  I have referred you to wound care for this.  If you develop spreading redness, fevers, pain, or worsening of the wound please go to the ED for evaluation.  We will get labs and contact you with the results.

## 2019-11-28 NOTE — Assessment & Plan Note (Signed)
Patient with a nonhealing wound from a prior blister.  Appears to have surrounding cellulitis.  Will start on doxycycline.  We are referring to wound care.  Given return precautions.  Follow-up was offered for Friday or next Monday and they opted for Monday.

## 2019-11-29 LAB — COMPREHENSIVE METABOLIC PANEL
ALT: 9 U/L (ref 0–53)
AST: 13 U/L (ref 0–37)
Albumin: 3.7 g/dL (ref 3.5–5.2)
Alkaline Phosphatase: 76 U/L (ref 39–117)
BUN: 16 mg/dL (ref 6–23)
CO2: 32 mEq/L (ref 19–32)
Calcium: 9.2 mg/dL (ref 8.4–10.5)
Chloride: 104 mEq/L (ref 96–112)
Creatinine, Ser: 0.93 mg/dL (ref 0.40–1.50)
GFR: 93.01 mL/min (ref >60.00)
Glucose, Bld: 80 mg/dL (ref 70–99)
Potassium: 4.1 mEq/L (ref 3.5–5.1)
Sodium: 142 mEq/L (ref 135–145)
Total Bilirubin: 1.8 mg/dL — ABNORMAL HIGH (ref 0.2–1.2)
Total Protein: 6.1 g/dL (ref 6.0–8.3)

## 2019-11-29 LAB — CBC WITH DIFFERENTIAL/PLATELET
Basophils Absolute: 0.1 10*3/uL (ref 0.0–0.1)
Basophils Relative: 0.9 % (ref 0.0–3.0)
Eosinophils Absolute: 0.2 10*3/uL (ref 0.0–0.7)
Eosinophils Relative: 2.7 % (ref 0.0–5.0)
HCT: 41.4 % (ref 39.0–52.0)
Hemoglobin: 13.8 g/dL (ref 13.0–17.0)
Lymphocytes Relative: 23.2 % (ref 12.0–46.0)
Lymphs Abs: 1.6 10*3/uL (ref 0.7–4.0)
MCHC: 33.4 g/dL (ref 30.0–36.0)
MCV: 83.5 fl (ref 78.0–100.0)
Monocytes Absolute: 0.7 10*3/uL (ref 0.1–1.0)
Monocytes Relative: 10.1 % (ref 3.0–12.0)
Neutro Abs: 4.3 10*3/uL (ref 1.4–7.7)
Neutrophils Relative %: 63.1 % (ref 43.0–77.0)
Platelets: 164 10*3/uL (ref 150.0–400.0)
RBC: 4.95 Mil/uL (ref 4.22–5.81)
RDW: 14.3 % (ref 11.5–15.5)
WBC: 6.9 10*3/uL (ref 4.0–10.5)

## 2019-11-29 LAB — LIPID PANEL
Cholesterol: 164 mg/dL (ref 0–200)
HDL: 52.2 mg/dL (ref >39.00)
LDL Cholesterol: 97 mg/dL (ref 0–99)
NonHDL: 111.71
Total CHOL/HDL Ratio: 3
Triglycerides: 73 mg/dL (ref 0.0–149.0)
VLDL: 14.6 mg/dL (ref 0.0–40.0)

## 2019-11-29 LAB — HEMOGLOBIN A1C: Hgb A1c MFr Bld: 5.2 % (ref 4.6–6.5)

## 2019-12-03 ENCOUNTER — Ambulatory Visit
Admission: RE | Admit: 2019-12-03 | Discharge: 2019-12-03 | Disposition: A | Discharge status: Home / Self Care | Payer: Medicare Other | Source: Ambulatory Visit | Attending: Family Medicine | Admitting: Family Medicine

## 2019-12-03 ENCOUNTER — Ambulatory Visit (INDEPENDENT_AMBULATORY_CARE_PROVIDER_SITE_OTHER): Payer: Medicare Other | Admitting: Family Medicine

## 2019-12-03 ENCOUNTER — Encounter: Payer: Self-pay | Admitting: Family Medicine

## 2019-12-03 ENCOUNTER — Other Ambulatory Visit: Payer: Self-pay

## 2019-12-03 ENCOUNTER — Ambulatory Visit: Admission: RE | Admit: 2019-12-03 | Payer: Medicare Other | Source: Ambulatory Visit | Admitting: *Deleted

## 2019-12-03 VITALS — BP 160/60 | HR 89 | Temp 99.0°F | Ht 71.0 in | Wt 303.2 lb

## 2019-12-03 DIAGNOSIS — L03115 Cellulitis of right lower limb: Secondary | ICD-10-CM | POA: Diagnosis not present

## 2019-12-03 DIAGNOSIS — M7989 Other specified soft tissue disorders: Secondary | ICD-10-CM | POA: Diagnosis not present

## 2019-12-03 MED ORDER — CEPHALEXIN 500 MG PO CAPS: 500.0000 mg | ORAL_CAPSULE | Freq: Four times a day (QID) | ORAL | 0 refills | Status: DC

## 2019-12-03 NOTE — Patient Instructions (Addendum)
Nice to see you. Please start the Keflex today.  If the redness spreads or the leg gets larger prior to Wednesday please go to the emergency room.  If you develop any fevers please seek medical attention as well. Please go get the ultrasound. Please remove the bandage this evening.  You can replace it and then look at it again tomorrow without the bandage.  I will see you back on Wednesday.

## 2019-12-03 NOTE — Assessment & Plan Note (Signed)
Ultrasound ordered to rule out DVT though I do suspect it is likely related to the cellulitis and the patient's lymphedema.

## 2019-12-03 NOTE — Assessment & Plan Note (Signed)
Patient's area of erythema appears to spread slightly.  He notes no fevers.  He has an appointment with the wound center though it is not until August.  We will have him complete his course of doxycycline.  We will add Keflex.  We will follow up in 2 days.  Part of the dead skin was deroofed as it appeared to be part of a prior blister.  See below for procedure detail.  Advised that if he has any increased swelling, spreading redness, or if he develops fevers or any worsening symptoms he needs to go the emergency room prior to Wednesday.

## 2019-12-03 NOTE — Assessment & Plan Note (Signed)
Plan to recheck on Wednesday.

## 2019-12-03 NOTE — Progress Notes (Signed)
Marikay Alar, MD Phone: [phone]  [name] is a 86 y.o. male who presents today for f/u.  Cellulitis/right lower extremity wound: Patient notes his leg has gotten larger since his last visit.  No significant spreading redness.  No fever.  No pain.  They have not gotten into see wound care yet though are scheduled to see them in August.  He is taking the doxycycline.  He notes his right leg is not usually this large.  Hyperbilirubinemia: Elevated on recent labs.  Needs to be rechecked.  Social History   Tobacco Use  Smoking Status Former Smoker  . Packs/Postel: 1.50  . Years: 20.00  . Pack years: 30.00  . Types: Cigarettes  . Quit date: 02/22/1981  . Years since quitting: 38.8  Smokeless Tobacco Never Used     ROS see history of present illness  Objective  Physical Exam Vitals:   12/03/19 1520  BP: (!) 160/60  Pulse: 89  Temp: 99 F (37.2 C)  SpO2: 98%    BP Readings from Last 3 Encounters:  12/03/19 (!) 160/60  11/28/19 (!) 160/69  04/25/19 (!) 134/44   Wt Readings from Last 3 Encounters:  12/03/19 (!) 303 lb 3.2 oz (137.5 kg)  11/28/19 298 lb 3.2 oz (135.3 kg)  11/08/19 282 lb (127.9 kg)    Physical Exam     Assessment/Plan: Please see individual problem list.  Cellulitis Patient's area of erythema appears to spread slightly.  He notes no fevers.  He has an appointment with the wound center though it is not until August.  We will have him complete his course of doxycycline.  We will add Keflex.  We will follow up in 2 days.  Part of the dead skin was deroofed as it appeared to be part of a prior blister.  See below for procedure detail.  Advised that if he has any increased swelling, spreading redness, or if he develops fevers or any worsening symptoms he needs to go the emergency room prior to Wednesday.  Hyperbilirubinemia Plan to recheck on Wednesday.  Right leg swelling Ultrasound ordered to rule out DVT though I do suspect it is likely  related to the cellulitis and the patient's lymphedema.   I discussed the removal of dead skin and deroofing of the likely blister tissue at the superior aspect of his wound with the patient.  I discussed the benefit of removal of this may allow ease of healing and decreased risk of spreading infection.  Discussed the risk of removing this including pain, bleeding, and infection.  The patient verbally agreed to proceed.  The area was cleansed with Betadine and the superior portion of the wound above with the blister tissue was D removed.  No anesthetic was needed.  Once completed the area was cleansed with saline.  The area was dressed with a nonstick pad and wrapped.  The patient tolerated this well.  I advised that the patient remove the dressing this evening to observe the area and then he could redress it and remove it tomorrow morning to observe the area to ensure that there is no spreading infection.    Orders Placed This Encounter  Procedures  . US Venous Img Lower Unilateral Right    Standing Status:   Future    Standing Expiration Date:   12/02/2020    Order Specific Question:   Reason for Exam (SYMPTOM  OR DIAGNOSIS REQUIRED)    Answer:   RLE swelling increased since last week, has cellulitis being  treated with antibiotics    Order Specific Question:   Preferred imaging location?    Answer:   [facility]    Order Specific Question:   Call Results- Best Contact Number?    Answer:   [phone]- pt can leave  . Bilirubin, fractionated(tot/dir/indir)    Standing Status:   Future    Standing Expiration Date:   02/03/2020    Meds ordered this encounter  Medications  . cephALEXin (KEFLEX) 500 MG capsule    Sig: Take 1 capsule (500 mg total) by mouth 4 (four) times daily.    Dispense:  28 capsule    Refill:  0    This visit occurred during the SARS-CoV-2 public health emergency.  Safety protocols were in place, including screening questions prior to the visit, additional usage  of staff PPE, and extensive cleaning of exam room while observing appropriate contact time as indicated for disinfecting solutions.   I have spent 45 minutes in the care of this patient regarding history taking, formulation of plan, communication with staff regarding arranging ultrasound, cleansing and debriding the wound, and documentation.   Marikay Alar, MD [facility]

## 2019-12-05 ENCOUNTER — Other Ambulatory Visit: Payer: Self-pay

## 2019-12-05 ENCOUNTER — Telehealth: Payer: Self-pay | Admitting: Family Medicine

## 2019-12-05 ENCOUNTER — Ambulatory Visit (INDEPENDENT_AMBULATORY_CARE_PROVIDER_SITE_OTHER): Payer: Medicare Other | Admitting: Family Medicine

## 2019-12-05 ENCOUNTER — Encounter: Payer: Self-pay | Admitting: Family Medicine

## 2019-12-05 DIAGNOSIS — L03115 Cellulitis of right lower limb: Secondary | ICD-10-CM

## 2019-12-05 NOTE — Progress Notes (Signed)
  Marikay Alar, MD Phone: [phone]  [name] is a 86 y.o. male who presents today for f/u.  Cellulitis/right lower extremity wound: Patient reports that the swelling was down quite a bit this morning.  The area of redness on the anterior portion of his leg was improved after the swelling went down.  He notes the drainage is not as bad.  No fevers.  He has been on doxycycline and Keflex.  Social History   Tobacco Use  Smoking Status Former Smoker  . Packs/Conchas: 1.50  . Years: 20.00  . Pack years: 30.00  . Types: Cigarettes  . Quit date: 02/22/1981  . Years since quitting: 38.8  Smokeless Tobacco Never Used     ROS see history of present illness  Objective  Physical Exam Vitals:   12/05/19 1534  BP: 130/70  Pulse: 91  Temp: 99.2 F (37.3 C)  SpO2: 96%    BP Readings from Last 3 Encounters:  12/05/19 130/70  12/03/19 (!) 160/60  11/28/19 (!) 160/69   Wt Readings from Last 3 Encounters:  12/05/19 (!) 302 lb 12.8 oz (137.3 kg)  12/03/19 (!) 303 lb 3.2 oz (137.5 kg)  11/28/19 298 lb 3.2 oz (135.3 kg)    Physical Exam  Erythema superior to the wound appears stable though anterior wound it appears to have spread slightly   Assessment/Plan: Please see individual problem list.  Cellulitis Discussed with the patient that the area on the anterior portion of his leg adjacent to the wound appears to be more erythematous during our office visit than it was previously.  Given this I discussed having him go to the emergency department for evaluation for IV antibiotics and possible admission.  He noted he did not really want to do that today.  He notes that the area looked much better this morning.  I discussed the risk of worsening infection and progression of symptoms to require emergent treatment in the ED and he understands those.  He would like to try 1 more Gueye of antibiotics and see if this improves.  I will have somebody contact him tomorrow to check in on him to  see if this is improving.  If not improving by tomorrow he knows he needs to go to the emergency department for evaluation.   No orders of the defined types were placed in this encounter.   No orders of the defined types were placed in this encounter.   This visit occurred during the SARS-CoV-2 public health emergency.  Safety protocols were in place, including screening questions prior to the visit, additional usage of staff PPE, and extensive cleaning of exam room while observing appropriate contact time as indicated for disinfecting solutions.    Marikay Alar, MD [facility]

## 2019-12-05 NOTE — Telephone Encounter (Signed)
Please call the patients brother on Thursday to check on his leg. Please see if the redness has improved at all or if it has worsened. Please see if he has developed any fevers. If the area has not improved he needs to go to the ED for evaluation.

## 2019-12-05 NOTE — Patient Instructions (Signed)
Nice to see you. We discussed having him go to the emergency room today though he deferred this as your leg was not looking like this earlier today.  Please continue with your Keflex. If the redness does not improve by tomorrow you need to go to the emergency department.  If you develop any fevers or pain or worsening swelling please go to the emergency department as well.  I will have somebody contact you tomorrow to check in.

## 2019-12-05 NOTE — Assessment & Plan Note (Signed)
Discussed with the patient that the area on the anterior portion of his leg adjacent to the wound appears to be more erythematous during our office visit than it was previously.  Given this I discussed having him go to the emergency department for evaluation for IV antibiotics and possible admission.  He noted he did not really want to do that today.  He notes that the area looked much better this morning.  I discussed the risk of worsening infection and progression of symptoms to require emergent treatment in the ED and he understands those.  He would like to try 1 more [place] of antibiotics and see if this improves.  I will have somebody contact him tomorrow to check in on him to see if this is improving.  If not improving by tomorrow he knows he needs to go to the emergency department for evaluation.

## 2019-12-06 NOTE — Telephone Encounter (Signed)
Noted. Please follow up with them again on Friday to see how the area is doing. Thanks.

## 2019-12-06 NOTE — Telephone Encounter (Signed)
I called the patients brother to check on the patient and he stated that some of the swelling went down on the leg and it is not as red as it was before he has had not fever. I informed him that if he get a fever or it gets redder that it was before to take him to the ED for evaluation and he understood.  Katalyna Socarras,cma

## 2019-12-07 NOTE — Telephone Encounter (Signed)
I called the patient and spoke with the brother and the swelling has gone down some and the redness is not as bad.  He is doing fine according to his caretaker.   Sheliah Fiorillo,cma

## 2019-12-07 NOTE — Telephone Encounter (Signed)
Can you follow-up with him again to see how he is doing?

## 2019-12-08 ENCOUNTER — Other Ambulatory Visit: Payer: Self-pay | Admitting: Family

## 2019-12-16 IMAGING — XA DG FLUORO GUIDE NDL PLC/BX
1 series · 1 of 1 positions shown · non-contrast
Comparison: Radiography

CLINICAL DATA: Osteoarthritis of the left knee. Recurrent pain.
Good response to previous steroid injections.

EXAM:
Left knee joint steroid injection
TECHNIQUE: The overlying skin was prepped with Betadine, draped in the usual
sterile fashion, and infiltrated locally with 1% Lidocaine. Using a
lateral approach, a 21 gauge needle was placed into the knee joint
on 1 pass. 100 mg Depo-Medrol mixed with 3 cc 1% lidocaine were
injected into the joint. The procedure was well-tolerated.
FLUOROSCOPY TIME:  1 second.  1.22 micro gray meter squared

[Series 1: ortho standard · 1 of 1 slices shown]
[im 1/1]
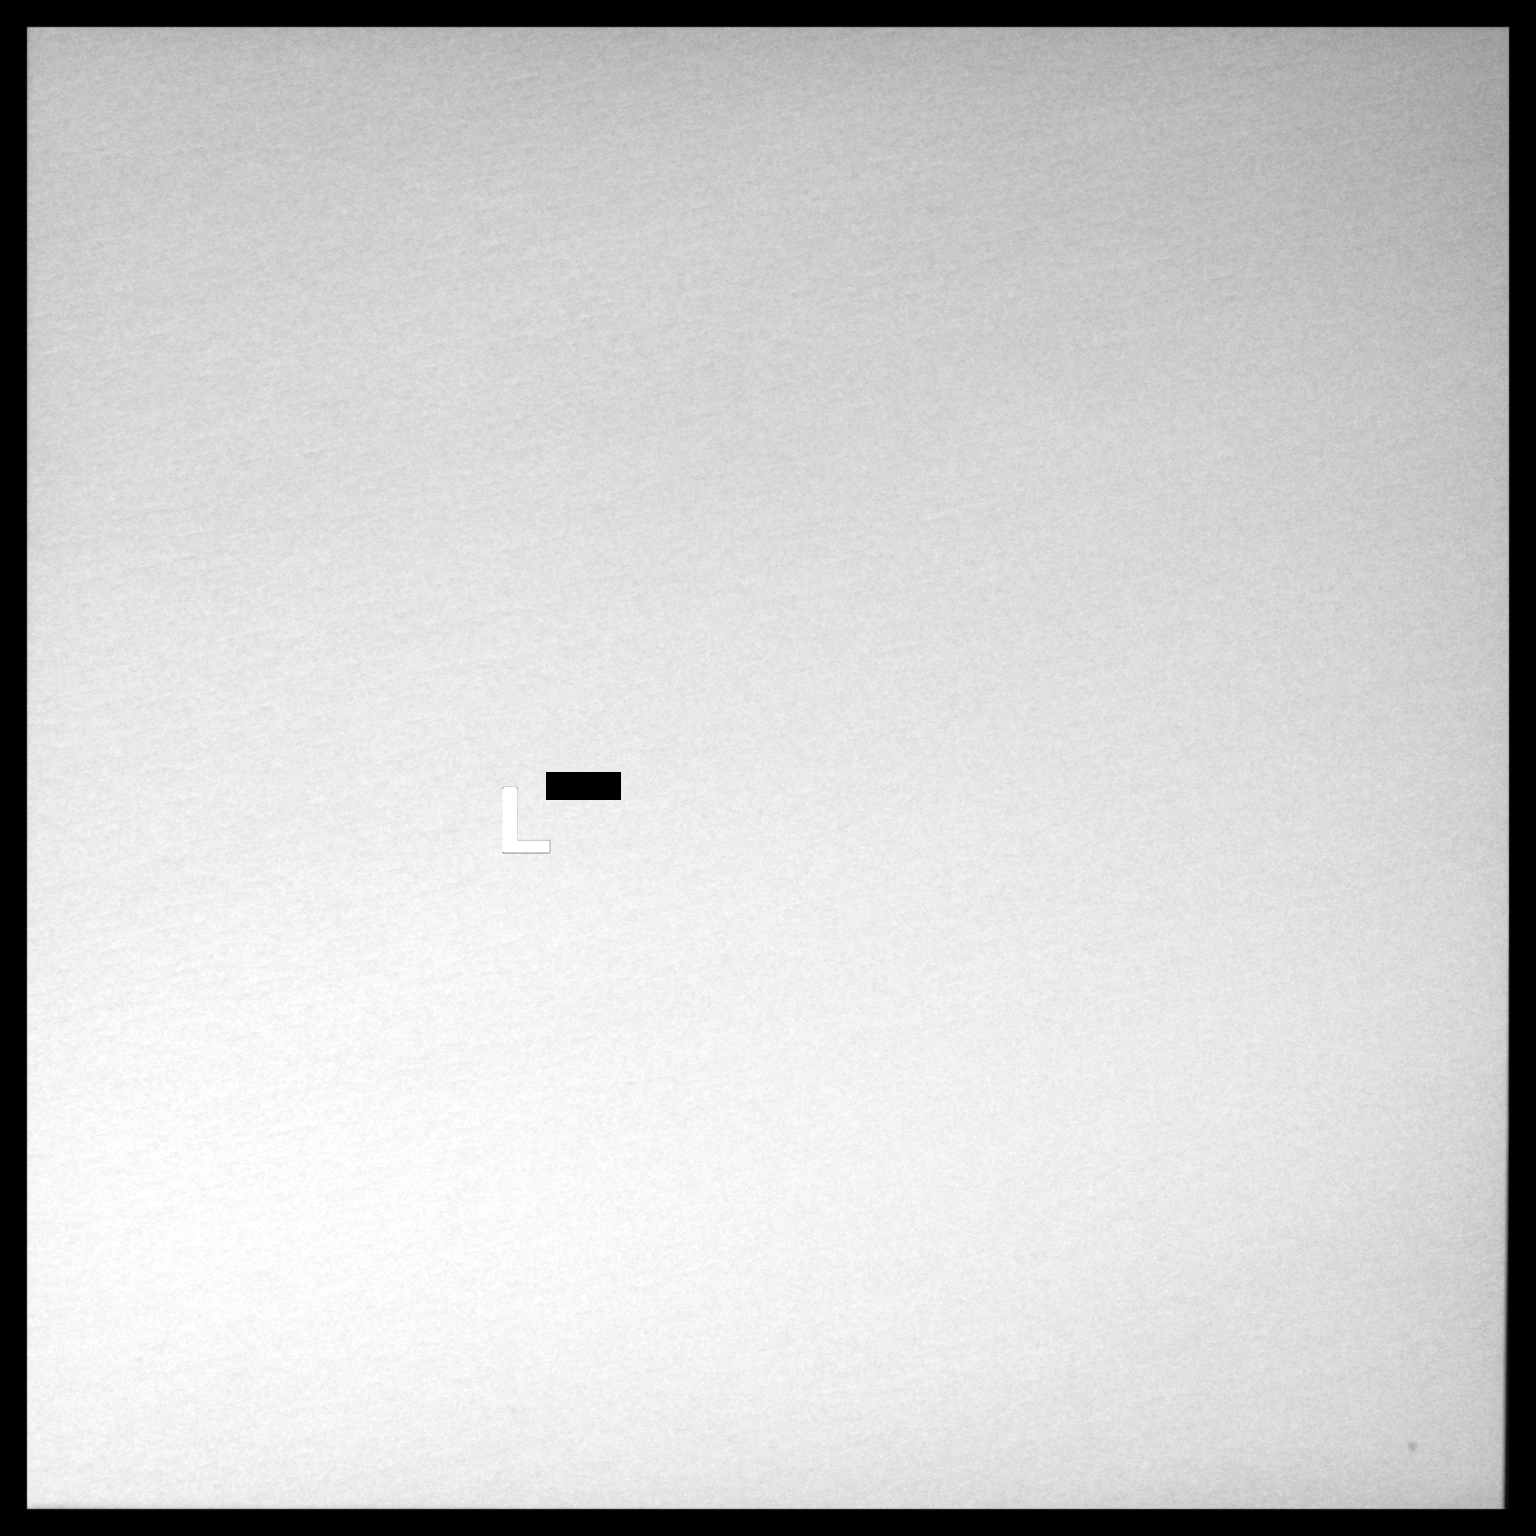

[1 of 1 positions shown; findings below may reference images not displayed]

IMPRESSION: Technically successful left knee intra-articular steroid injection.

## 2019-12-28 ENCOUNTER — Other Ambulatory Visit: Payer: Self-pay

## 2019-12-28 ENCOUNTER — Encounter: Payer: Medicare Other | Attending: Physician Assistant | Admitting: Physician Assistant

## 2019-12-28 DIAGNOSIS — Z8249 Family history of ischemic heart disease and other diseases of the circulatory system: Secondary | ICD-10-CM | POA: Insufficient documentation

## 2019-12-28 DIAGNOSIS — Z87891 Personal history of nicotine dependence: Secondary | ICD-10-CM | POA: Insufficient documentation

## 2019-12-28 DIAGNOSIS — I890 Lymphedema, not elsewhere classified: Secondary | ICD-10-CM | POA: Insufficient documentation

## 2019-12-28 DIAGNOSIS — I779 Disorder of arteries and arterioles, unspecified: Secondary | ICD-10-CM | POA: Diagnosis not present

## 2019-12-28 DIAGNOSIS — I2510 Atherosclerotic heart disease of native coronary artery without angina pectoris: Secondary | ICD-10-CM | POA: Diagnosis not present

## 2019-12-28 DIAGNOSIS — E669 Obesity, unspecified: Secondary | ICD-10-CM | POA: Insufficient documentation

## 2019-12-28 DIAGNOSIS — I5042 Chronic combined systolic (congestive) and diastolic (congestive) heart failure: Secondary | ICD-10-CM | POA: Insufficient documentation

## 2019-12-28 DIAGNOSIS — I110 Hypertensive heart disease with heart failure: Secondary | ICD-10-CM | POA: Diagnosis not present

## 2019-12-28 NOTE — Progress Notes (Signed)
HAMZAH, SAVOCA ([id number]) Visit Report for 12/28/2019 Abuse/Suicide Risk Screen Details Patient Name: [name], [name] Date of Service: 12/28/2019 12:45 PM Medical Record Number: [id number] Patient Account Number: [id number] Date of Birth/Sex: 1930/08/19 (86 y.o. M) Treating RN: Tyler Aas Primary Care Meline Russaw: Birdie Sons, ERIC Other Clinician: Referring Mackinsey Pelland: Birdie Sons, ERIC Treating Aashna Matson/Extender: Linwood Dibbles, HOYT Weeks in Treatment: 0 Abuse/Suicide Risk Screen Items Answer ABUSE RISK SCREEN: Has anyone close to you tried to hurt or harm you recentlyo No Do you feel uncomfortable with anyone in your familyo No Has anyone forced you do things that you didnot want to doo No Electronic Signature(s) Signed: 12/28/2019 4:49:15 PM By: Tyler Aas Entered By: Tyler Aas on 12/28/2019 13:24:27 Dola Argyle ([id number]) -------------------------------------------------------------------------------- Activities of Daily Living Details Patient Name: Dola Argyle Date of Service: 12/28/2019 12:45 PM Medical Record Number: [id number] Patient Account Number: [id number] Date of Birth/Sex: 09-16-1930 (86 y.o. M) Treating RN: Tyler Aas Primary Care Kassady Laboy: Birdie Sons, ERIC Other Clinician: Referring Hanna Ra: Birdie Sons, ERIC Treating Jeanise Durfey/Extender: Linwood Dibbles, HOYT Weeks in Treatment: 0 Activities of Daily Living Items Answer Activities of Daily Living (Please select one for each item) Drive Automobile Completely Able Take Medications Completely Able Use Telephone Completely Able Care for Appearance Completely Able Use Toilet Completely Able Bath / Shower Completely Able Dress Self Completely Able Feed Self Completely Able Walk Completely Able Get In / Out Bed Completely Able Housework Completely Able Prepare Meals Completely Able Handle Money Completely Able Shop for Self Completely Able Electronic Signature(s) Signed: 12/28/2019 4:49:15 PM By: Tyler Aas Entered By: Tyler Aas on 12/28/2019 13:25:12 Dola Argyle ([id number]) -------------------------------------------------------------------------------- Education Screening Details Patient Name: Dola Argyle Date of Service: 12/28/2019 12:45 PM Medical Record Number: [id number] Patient Account Number: [id number] Date of Birth/Sex: 1930-12-16 (86 y.o. M) Treating RN: Tyler Aas Primary Care Lilianna Case: Birdie Sons, ERIC Other Clinician: Referring Erma Joubert: Birdie Sons, ERIC Treating Cambre Matson/Extender: Skeet Simmer in Treatment: 0 Learning Preferences/Education Level/Primary Language Learning Preference: Explanation, Demonstration, Printed Material Highest Education Level: High School Preferred Language: English Cognitive Barrier Language Barrier: No Translator Needed: No Emotional Barrier: No Cultural/Religious Beliefs Affecting Medical Care: No Physical Barrier Impaired Vision: No Impaired Hearing: Yes Decreased Hand dexterity: No Knowledge/Comprehension Knowledge Level: Medium Comprehension Level: Medium Ability to understand written instructions: Medium Ability to understand verbal instructions: Medium Motivation Anxiety Level: Calm Cooperation: Cooperative Education Importance: Acknowledges Need Interest in Health Problems: Asks Questions Perception: Coherent Willingness to Engage in Self-Management High Activities: Readiness to Engage in Self-Management High Activities: Electronic Signature(s) Signed: 12/28/2019 4:49:15 PM By: Tyler Aas Entered By: Tyler Aas on 12/28/2019 13:26:03 PATRYCK, KILGORE ([id number]) -------------------------------------------------------------------------------- Fall Risk Assessment Details Patient Name: Dola Argyle Date of Service: 12/28/2019 12:45 PM Medical Record Number: [id number] Patient Account Number: [id number] Date of Birth/Sex: [birth year] (86 y.o. M) Treating RN: Tyler Aas Primary Care  Aul Mangieri: Birdie Sons, ERIC Other Clinician: Referring Emmitte Surgeon: Birdie Sons, ERIC Treating Mckinley Olheiser/Extender: Linwood Dibbles, HOYT Weeks in Treatment: 0 Fall Risk Assessment Items Have you had 2 or more falls in the last 12 monthso 0 No Have you had any fall that resulted in injury in the last 12 monthso 0 No FALLS RISK SCREEN History of falling - immediate or within 3 months 0 No Secondary diagnosis (Do you have 2 or more medical diagnoseso) 0 No Ambulatory aid None/bed rest/wheelchair/nurse 0 No Crutches/cane/walker 0 No Furniture 0 No Intravenous therapy Access/Saline/Heparin Lock 0 No Gait/Transferring Normal/ bed rest/ wheelchair 0 No Weak (short steps with or without shuffle, stooped but able to lift head while walking, may  0 No seek support from furniture) Impaired (short steps with shuffle, may have difficulty arising from chair, head down, impaired 0 No balance) Mental Status Oriented to own ability 0 No Electronic Signature(s) Signed: 12/28/2019 4:49:15 PM By: Tyler Aas Entered By: Tyler Aas on 12/28/2019 13:26:21 Dola Argyle ([id number]) -------------------------------------------------------------------------------- Foot Assessment Details Patient Name: Dola Argyle Date of Service: 12/28/2019 12:45 PM Medical Record Number: [id number] Patient Account Number: [id number] Date of Birth/Sex: [birth year] (86 y.o. M) Treating RN: Tyler Aas Primary Care Tiffiney Sparrow: Birdie Sons, ERIC Other Clinician: Referring Ariz Terrones: Birdie Sons, ERIC Treating Jeffery Gammell/Extender: Linwood Dibbles, HOYT Weeks in Treatment: 0 Foot Assessment Items Site Locations + = Sensation present, - = Sensation absent, C = Callus, U = Ulcer R = Redness, W = Warmth, M = Maceration, PU = Pre-ulcerative lesion F = Fissure, S = Swelling, D = Dryness Assessment Right: Left: Other Deformity: No No Prior Foot Ulcer: No No Prior Amputation: No No Charcot Joint: No No Ambulatory  Status: Gait: Electronic Signature(s) Signed: 12/28/2019 4:49:15 PM By: Tyler Aas Entered By: Tyler Aas on 12/28/2019 13:27:04 Dola Argyle ([id number]) -------------------------------------------------------------------------------- Nutrition Risk Screening Details Patient Name: Dola Argyle Date of Service: 12/28/2019 12:45 PM Medical Record Number: [id number] Patient Account Number: [id number] Date of Birth/Sex: 08/28/1930 (86 y.o. M) Treating RN: Tyler Aas Primary Care Tashonna Descoteaux: Birdie Sons, ERIC Other Clinician: Referring Marshell Rieger: Birdie Sons, ERIC Treating Shaya Altamura/Extender: STONE III, HOYT Weeks in Treatment: 0 Height (in): 71 Weight (lbs): Body Mass Index (BMI): Nutrition Risk Screening Items Score Screening NUTRITION RISK SCREEN: I have an illness or condition that made me change the kind and/or amount of food I eat 0 No I eat fewer than two meals per Kunath 0 No I eat few fruits and vegetables, or milk products 0 No I have three or more drinks of beer, liquor or wine almost every Mat[place] 0 No I have tooth or mouth problems that make it hard for me to eat 0 No I don't always have enough money to buy the food I need 0 No I eat alone most of the time 0 No I take three or more different prescribed or over-the-counter drugs a Keisling 0 No Without wanting to, I have lost or gained 10 pounds in the last six months 0 No I am not always physically able to shop, cook and/or feed myself 0 No Nutrition Protocols Good Risk Protocol 0 No interventions needed Moderate Risk Protocol High Risk Proctocol Risk Level: Good Risk Score: 0 Electronic Signature(s) Signed: 12/28/2019 4:49:15 PM By: Tyler Aas Entered By: Tyler Aas on 12/28/2019 13:26:46

## 2019-12-28 NOTE — Progress Notes (Signed)
JAWAN, CHAVARRIA ([id number]) Visit Report for 12/28/2019 Chief Complaint Document Details Patient Name: [name], [name] Date of Service: 12/28/2019 12:45 PM Medical Record Number: [id number] Patient Account Number: [id number] Date of Birth/Sex: 05/09/1931 (86 y.o. M) Treating RN: Huel Coventry Primary Care Provider: Birdie Sons, ERIC Other Clinician: Referring Provider: Birdie Sons, ERIC Treating Provider/Extender: Linwood Dibbles, Phelix Fudala Weeks in Treatment: 0 Information Obtained from: Patient Chief Complaint Bilateral Lymphedema Electronic Signature(s) Signed: 12/28/2019 2:04:32 PM By: Lenda Kelp PA-C Entered By: Lenda Kelp on 12/28/2019 14:04:31 [name] ([id number]) -------------------------------------------------------------------------------- HPI Details Patient Name: [name] Date of Service: 12/28/2019 12:45 PM Medical Record Number: [id number] Patient Account Number: [id number] Date of Birth/Sex: [birth year] (86 y.o. M) Treating RN: Huel Coventry Primary Care Provider: Birdie Sons, ERIC Other Clinician: Referring Provider: Birdie Sons, ERIC Treating Provider/Extender: Linwood Dibbles, Nicholad Kautzman Weeks in Treatment: 0 History of Present Illness HPI Description: 12/28/2019 on evaluation today patient appears for initial evaluation in clinic concerning issues that he has been having with his bilateral lower extremities. His right is worse than left though he does have stage III lymphedema bilaterally. He also has hypertension, coronary artery disease, and congestive heart failure. He did have a DVT study which was negative again it does not appear that he has a DVT this appears to likely be more secondarily a lymphedema type issue. Nonetheless I do believe the patient may benefit from a referral to lymphedema clinic. Fortunately there is no signs of open wounds at this time. Electronic Signature(s) Signed: 12/28/2019 2:28:47 PM By: Lenda Kelp PA-C Entered By: Lenda Kelp on 12/28/2019  14:28:46 [name], [name] ([id number]) -------------------------------------------------------------------------------- Physical Exam Details Patient Name: [name] Date of Service: 12/28/2019 12:45 PM Medical Record Number: [id number] Patient Account Number: [id number] Date of Birth/Sex: Dec 07, 1930 (86 y.o. M) Treating RN: Huel Coventry Primary Care Provider: Birdie Sons, ERIC Other Clinician: Referring Provider: Birdie Sons, ERIC Treating Provider/Extender: Linwood Dibbles, Isham Smitherman Weeks in Treatment: 0 Constitutional patient is hypertensive.. pulse regular and within target range for patient.Marland Kitchen respirations regular, non-labored and within target range for patient.Marland Kitchen temperature within target range for patient.. Obese and well-hydrated in no acute distress. Eyes conjunctiva clear no eyelid edema noted. pupils equal round and reactive to light and accommodation. Ears, Nose, Mouth, and Throat no gross abnormality of ear auricles or external auditory canals. normal hearing noted during conversation. mucus membranes moist. Respiratory normal breathing without difficulty. Cardiovascular 2+ dorsalis pedis/posterior tibialis pulses. no clubbing, cyanosis, significant edema, <3 sec cap refill. Musculoskeletal normal gait and posture. no significant deformity or arthritic changes, no loss or range of motion, no clubbing. Psychiatric this patient is able to make decisions and demonstrates good insight into disease process. Alert and Oriented x 3. pleasant and cooperative. Notes Upon inspection patient's wounds currently showed signs of being completely healed as far as the areas that appear to have been opened in the past. Fortunately there is no signs of infection at this time and overall good epithelial growth is noted. Electronic Signature(s) Signed: 12/28/2019 2:29:21 PM By: Lenda Kelp PA-C Entered By: Lenda Kelp on 12/28/2019 14:29:19 [name]  ([id number]) -------------------------------------------------------------------------------- Physician Orders Details Patient Name: [name] Date of Service: 12/28/2019 12:45 PM Medical Record Number: [id number] Patient Account Number: [id number] Date of Birth/Sex: 1930/09/03 (86 y.o. M) Treating RN: Tyler Aas Primary Care Provider: Birdie Sons, ERIC Other Clinician: Referring Provider: Birdie Sons, ERIC Treating Provider/Extender: Linwood Dibbles, Breckyn Troyer Weeks in Treatment: 0 Verbal / Phone Orders: No Diagnosis Coding ICD-10 Coding Code Description I89.0 Lymphedema, not elsewhere classified I10 Essential (primary)  hypertension I25.10 Atherosclerotic heart disease of native coronary artery without angina pectoris I50.42 Chronic combined systolic (congestive) and diastolic (congestive) heart failure Discharge From [facility] Services o Discharge from Wound Care Center - No open wounds, refer to lymphedema clinic for management Services and Therapies o Lymphedema Clinic - Referral made for lymphedema management Electronic Signature(s) Signed: 12/28/2019 4:49:15 PM By: Tyler Aas Signed: 12/28/2019 5:00:44 PM By: Lenda Kelp PA-C Entered By: Tyler Aas on 12/28/2019 14:09:22 [name] ([id number]) -------------------------------------------------------------------------------- Problem List Details Patient Name: [name] Date of Service: 12/28/2019 12:45 PM Medical Record Number: [id number] Patient Account Number: [id number] Date of Birth/Sex: 07-22-1930 (86 y.o. M) Treating RN: Huel Coventry Primary Care Provider: Birdie Sons, ERIC Other Clinician: Referring Provider: Birdie Sons, ERIC Treating Provider/Extender: Linwood Dibbles, Calinda Stockinger Weeks in Treatment: 0 Active Problems ICD-10 Encounter Code Description Active Date MDM Diagnosis I89.0 Lymphedema, not elsewhere classified 12/28/2019 No Yes I10 Essential (primary) hypertension 12/28/2019 No Yes I25.10 Atherosclerotic heart  disease of native coronary artery without angina 12/28/2019 No Yes pectoris I50.42 Chronic combined systolic (congestive) and diastolic (congestive) heart 12/28/2019 No Yes failure Inactive Problems Resolved Problems Electronic Signature(s) Signed: 12/28/2019 2:04:05 PM By: Lenda Kelp PA-C Entered By: Lenda Kelp on 12/28/2019 14:04:05 [name] ([id number]) -------------------------------------------------------------------------------- Progress Note Details Patient Name: [name] Date of Service: 12/28/2019 12:45 PM Medical Record Number: [id number] Patient Account Number: [id number] Date of Birth/Sex: Jul 20, 1930 (86 y.o. M) Treating RN: Huel Coventry Primary Care Provider: Birdie Sons, ERIC Other Clinician: Referring Provider: Birdie Sons, ERIC Treating Provider/Extender: Linwood Dibbles, Layann Bluett Weeks in Treatment: 0 Subjective Chief Complaint Information obtained from Patient Bilateral Lymphedema History of Present Illness (HPI) 12/28/2019 on evaluation today patient appears for initial evaluation in clinic concerning issues that he has been having with his bilateral lower extremities. His right is worse than left though he does have stage III lymphedema bilaterally. He also has hypertension, coronary artery disease, and congestive heart failure. He did have a DVT study which was negative again it does not appear that he has a DVT this appears to likely be more secondarily a lymphedema type issue. Nonetheless I do believe the patient may benefit from a referral to lymphedema clinic. Fortunately there is no signs of open wounds at this time. Patient History Information obtained from Patient. Allergies No Known Allergies Family History Heart Disease - Mother,Father, Stroke - Siblings, No family history of Cancer, Diabetes, Hereditary Spherocytosis, Hypertension, Kidney Disease, Lung Disease, Seizures, Thyroid Problems, Tuberculosis. Social History Former smoker, Marital Status -  Widowed, Alcohol Use - Never, Drug Use - No History, Caffeine Use - Daily. Medical History Eyes Denies history of Cataracts, Glaucoma, Optic Neuritis Hematologic/Lymphatic Denies history of Anemia, Hemophilia, Human Immunodeficiency Virus, Lymphedema, Sickle Cell Disease Respiratory Denies history of Aspiration, Asthma, Chronic Obstructive Pulmonary Disease (COPD), Pneumothorax, Sleep Apnea, Tuberculosis Cardiovascular Patient has history of Arrhythmia, Congestive Heart Failure, Coronary Artery Disease, Hypertension Denies history of Angina, Deep Vein Thrombosis, Hypotension, Myocardial Infarction, Peripheral Arterial Disease, Peripheral Venous Disease, Phlebitis, Vasculitis Endocrine Denies history of Type I Diabetes, Type II Diabetes Genitourinary Denies history of End Stage Renal Disease Immunological Denies history of Lupus Erythematosus, Raynaud s, Scleroderma Integumentary (Skin) Denies history of History of Burn, History of pressure wounds Musculoskeletal Patient has history of Osteoarthritis Denies history of Gout, Rheumatoid Arthritis, Osteomyelitis Neurologic Denies history of Dementia, Neuropathy, Quadriplegia, Paraplegia, Seizure Disorder Psychiatric Denies history of Anorexia/bulimia, Confinement Anxiety Hospitalization/Surgery History - cholecystectomy. Medical And Surgical History Notes Gastrointestinal hx of hepatitis but doesnt know which one Review of  Systems (ROS) Constitutional Symptoms (General Health) Denies complaints or symptoms of Fatigue, Fever, Chills, Marked Weight Change. Eyes Denies complaints or symptoms of Dry Eyes, Vision Changes, Glasses / Contacts. [name], [name] ([id number]) Ear/Nose/Mouth/Throat Denies complaints or symptoms of Difficult clearing ears, Sinusitis. Hematologic/Lymphatic Denies complaints or symptoms of Bleeding / Clotting Disorders, Human Immunodeficiency Virus, lymphadema Respiratory Complains or has symptoms of Shortness of  Breath - with activity. Cardiovascular Complains or has symptoms of LE edema. Gastrointestinal Denies complaints or symptoms of Frequent diarrhea, Nausea, Vomiting. Endocrine Denies complaints or symptoms of Hepatitis, Thyroid disease, Polydypsia (Excessive Thirst). Genitourinary Denies complaints or symptoms of Kidney failure/ Dialysis, Incontinence/dribbling. Immunological Denies complaints or symptoms of Hives, Itching. Integumentary (Skin) Complains or has symptoms of Wounds - lower extremity, Swelling. Denies complaints or symptoms of Bleeding or bruising tendency, Breakdown. Musculoskeletal Denies complaints or symptoms of Muscle Pain, Muscle Weakness. Neurologic Complains or has symptoms of Numbness/parasthesias - arm both intermittently. Denies complaints or symptoms of Focal/Weakness. Psychiatric Denies complaints or symptoms of Anxiety, Claustrophobia. Objective Constitutional patient is hypertensive.. pulse regular and within target range for patient.Marland Kitchen respirations regular, non-labored and within target range for patient.Marland Kitchen temperature within target range for patient.. Obese and well-hydrated in no acute distress. Vitals Time Taken: 1:13 PM, Height: 71 in, Source: Stated, Temperature: 99.3 F, Pulse: 105 bpm, Respiratory Rate: 22 breaths/min, Blood Pressure: 146/62 mmHg. Eyes conjunctiva clear no eyelid edema noted. pupils equal round and reactive to light and accommodation. Ears, Nose, Mouth, and Throat no gross abnormality of ear auricles or external auditory canals. normal hearing noted during conversation. mucus membranes moist. Respiratory normal breathing without difficulty. Cardiovascular 2+ dorsalis pedis/posterior tibialis pulses. no clubbing, cyanosis, significant edema, Musculoskeletal normal gait and posture. no significant deformity or arthritic changes, no loss or range of motion, no clubbing. Psychiatric this patient is able to make decisions and  demonstrates good insight into disease process. Alert and Oriented x 3. pleasant and cooperative. General Notes: Upon inspection patient's wounds currently showed signs of being completely healed as far as the areas that appear to have been opened in the past. Fortunately there is no signs of infection at this time and overall good epithelial growth is noted. Other Condition(s) Patient presents with Lymphedema located on the Bilateral Leg. Assessment Active Problems ICD-10 [name], [name] ([id number]) Lymphedema, not elsewhere classified Essential (primary) hypertension Atherosclerotic heart disease of native coronary artery without angina pectoris Chronic combined systolic (congestive) and diastolic (congestive) heart failure Plan Discharge From [facility] Services: Discharge from Wound Care Center - No open wounds, refer to lymphedema clinic for management Services and Therapies ordered were: Lymphedema Clinic - Referral made for lymphedema management 1. I would recommend at this time that we go ahead and have the patient follow-up with the lymphedema clinic will make a referral to them as well and I did give him a written prescription as well as putting this in the notes as well which we will send over. Nonetheless he does not have anything open or draining at this time I think primarily this is just a lymphedema issue which needs to be more appropriately managed. 2. With regard to his edema and swelling currently he is not benefiting traditional compression stockings therefore I am going to recommend that we go ahead and have the patient elevate his legs much as possible until we get him into the lymphedema clinic. He was seen in consult today we will just see him back as needed in future if anything changes or worsens. Electronic Signature(s) Signed:  12/28/2019 2:30:23 PM By: Lenda KelpStone III, Mariane Burpee PA-C Entered By: Lenda KelpStone III, Cassidey Barrales on 12/28/2019 14:30:22 [name], Jeffery  ([id number]) -------------------------------------------------------------------------------- ROS/PFSH Details Patient Name: [name], [name] Date of Service: 12/28/2019 12:45 PM Medical Record Number: [id number] Patient Account Number: [id number] Date of Birth/Sex: July 11, 1930 (86 y.o. M) Treating RN: Tyler AasButler, Michelle Primary Care Provider: Birdie SonsSONNENBERG, ERIC Other Clinician: Referring Provider: Birdie SonsSONNENBERG, ERIC Treating Provider/Extender: Linwood DibblesSTONE III, Zaneta Lightcap Weeks in Treatment: 0 Information Obtained From Patient Constitutional Symptoms (General Health) Complaints and Symptoms: Negative for: Fatigue; Fever; Chills; Marked Weight Change Eyes Complaints and Symptoms: Negative for: Dry Eyes; Vision Changes; Glasses / Contacts Medical History: Negative for: Cataracts; Glaucoma; Optic Neuritis Ear/Nose/Mouth/Throat Complaints and Symptoms: Negative for: Difficult clearing ears; Sinusitis Hematologic/Lymphatic Complaints and Symptoms: Negative for: Bleeding / Clotting Disorders; Human Immunodeficiency Virus Review of System Notes: lymphadema Medical History: Negative for: Anemia; Hemophilia; Human Immunodeficiency Virus; Lymphedema; Sickle Cell Disease Respiratory Complaints and Symptoms: Positive for: Shortness of Breath - with activity Medical History: Negative for: Aspiration; Asthma; Chronic Obstructive Pulmonary Disease (COPD); Pneumothorax; Sleep Apnea; Tuberculosis Cardiovascular Complaints and Symptoms: Positive for: LE edema Medical History: Positive for: Arrhythmia; Congestive Heart Failure; Coronary Artery Disease; Hypertension Negative for: Angina; Deep Vein Thrombosis; Hypotension; Myocardial Infarction; Peripheral Arterial Disease; Peripheral Venous Disease; Phlebitis; Vasculitis Gastrointestinal Complaints and Symptoms: Negative for: Frequent diarrhea; Nausea; Vomiting Medical History: Past Medical History Notes: hx of hepatitis but doesnt know which one Endocrine Swaim,  [name] ([id number]) Complaints and Symptoms: Negative for: Hepatitis; Thyroid disease; Polydypsia (Excessive Thirst) Medical History: Negative for: Type I Diabetes; Type II Diabetes Genitourinary Complaints and Symptoms: Negative for: Kidney failure/ Dialysis; Incontinence/dribbling Medical History: Negative for: End Stage Renal Disease Immunological Complaints and Symptoms: Negative for: Hives; Itching Medical History: Negative for: Lupus Erythematosus; Raynaudos; Scleroderma Integumentary (Skin) Complaints and Symptoms: Positive for: Wounds - lower extremity; Swelling Negative for: Bleeding or bruising tendency; Breakdown Medical History: Negative for: History of Burn; History of pressure wounds Musculoskeletal Complaints and Symptoms: Negative for: Muscle Pain; Muscle Weakness Medical History: Positive for: Osteoarthritis Negative for: Gout; Rheumatoid Arthritis; Osteomyelitis Neurologic Complaints and Symptoms: Positive for: Numbness/parasthesias - arm both intermittently Negative for: Focal/Weakness Medical History: Negative for: Dementia; Neuropathy; Quadriplegia; Paraplegia; Seizure Disorder Psychiatric Complaints and Symptoms: Negative for: Anxiety; Claustrophobia Medical History: Negative for: Anorexia/bulimia; Confinement Anxiety Oncologic Immunizations Pneumococcal Vaccine: Received Pneumococcal Vaccination: No Implantable Devices None Hospitalization / Surgery History Type of Hospitalization/Surgery cholecystectomy [name], [name] ([id number]) Family and Social History Cancer: No; Diabetes: No; Heart Disease: Yes - Mother,Father; Hereditary Spherocytosis: No; Hypertension: No; Kidney Disease: No; Lung Disease: No; Seizures: No; Stroke: Yes - Siblings; Thyroid Problems: No; Tuberculosis: No; Former smoker; Marital Status - Widowed; Alcohol Use: Never; Drug Use: No History; Caffeine Use: Daily; Financial Concerns: No; Food, Clothing or Shelter Needs: No; Support  System Lacking: No; Transportation Concerns: No Electronic Signature(s) Signed: 12/28/2019 4:49:15 PM By: Tyler AasButler, Michelle Signed: 12/28/2019 5:00:44 PM By: Lenda KelpStone III, [place] PA-C Entered By: Tyler AasButler, Michelle on 12/28/2019 13:24:13 [name], [name] ([id number]) -------------------------------------------------------------------------------- SuperBill Details Patient Name: [name], [name] Date of Service: 12/28/2019 Medical Record Number: [id number] Patient Account Number: [id number] Date of Birth/Sex: July 11, 1930 (86 y.o. M) Treating RN: Huel CoventryWoody, Kim Primary Care Provider: Birdie SonsSONNENBERG, ERIC Other Clinician: Referring Provider: Birdie SonsSONNENBERG, ERIC Treating Provider/Extender: Linwood DibblesSTONE III, Aashi Derrington Weeks in Treatment: 0 Diagnosis Coding ICD-10 Codes Code Description I89.0 Lymphedema, not elsewhere classified I10 Essential (primary) hypertension I25.10 Atherosclerotic heart disease of native coronary artery without angina pectoris I50.42 Chronic combined systolic (congestive) and diastolic (congestive) heart failure Facility Procedures CPT4 Code: [id number]  Description: 99213 - WOUND CARE VISIT-LEV 3 EST PT Modifier: Quantity: 1 Physician Procedures CPT4 Code: [id number] Description: WC PHYS LEVEL 3 o NEW PT Modifier: Quantity: 1 CPT4 Code: Description: ICD-10 Diagnosis Description I89.0 Lymphedema, not elsewhere classified I10 Essential (primary) hypertension I25.10 Atherosclerotic heart disease of native coronary artery without angi I50.42 Chronic combined systolic (congestive) and  diastolic (congestive) he Modifier: na pectoris art failure Quantity: Electronic Signature(s) Signed: 12/28/2019 2:30:44 PM By: Lenda Kelp PA-C Entered By: Lenda Kelp on 12/28/2019 14:30:43

## 2019-12-28 NOTE — Progress Notes (Signed)
Jeffery, [name] ([id number]) Visit Report for 12/28/2019 Allergy List Details Patient Name: [name], [name] Date of Service: 12/28/2019 12:45 PM Medical Record Number: [id number] Patient Account Number: [id number] Date of Birth/Sex: [birth year] (86 y.o. M) Treating RN: [name] Primary Care [name]: [name], Jeffery Other Clinician: Referring [name]: [name], Jeffery Treating [name]/Extender: STONE [name], Jeffery Weeks in Treatment: 0 Allergies Active Allergies No Known Allergies Allergy Notes Electronic Signature(s) Signed: 12/28/2019 4:49:15 PM By: [name] Entered By: [name] on 12/28/2019 13:40:33 [name], [name] ([id number]) -------------------------------------------------------------------------------- Arrival Information Details Patient Name: [name] Date of Service: 12/28/2019 12:45 PM Medical Record Number: [id number] Patient Account Number: [id number] Date of Birth/Sex: 12/31/1930 (86 y.o. M) Treating RN: [name] Primary Care Dino Borntreger: [name], Jeffery Other Clinician: Referring [name]: [name], Jeffery Treating [name]/Extender: [name], Jeffery Weeks in Treatment: 0 Visit Information Patient Arrived: Walker Arrival Time: 13:09 Accompanied By: brother Transfer Assistance: None Patient Identification Verified: Yes Secondary Verification Process Completed: Yes Electronic Signature(s) Signed: 12/28/2019 4:49:15 PM By: [name] Entered By: [name] on 12/28/2019 13:10:34 [name] ([id number]) -------------------------------------------------------------------------------- Clinic Level of Care Assessment Details Patient Name: [name] Date of Service: 12/28/2019 12:45 PM Medical Record Number: [id number] Patient Account Number: [id number] Date of Birth/Sex: 1930-06-30 (86 y.o. M) Treating RN: [name] Primary Care Skai Lickteig: [name], Jeffery Other Clinician: Referring Carlen Rebuck: [name], Jeffery Treating  Tashia Leiterman/Extender: [name], Jeffery Weeks in Treatment: 0 Clinic Level of Care Assessment Items TOOL 2 Quantity Score []  - Use when only an EandM is performed on the INITIAL visit 0 ASSESSMENTS - Nursing Assessment / Reassessment X - General Physical Exam (combine w/ comprehensive assessment (listed just below) when performed on new 1 20 pt. evals) X- 1 25 Comprehensive Assessment (HX, ROS, Risk Assessments, Wounds Hx, etc.) ASSESSMENTS - Wound and Skin Assessment / Reassessment X - Simple Wound Assessment / Reassessment - one wound 1 5 []  - 0 Complex Wound Assessment / Reassessment - multiple wounds []  - 0 Dermatologic / Skin Assessment (not related to wound area) ASSESSMENTS - Ostomy and/or Continence Assessment and Care []  - Incontinence Assessment and Management 0 []  - 0 Ostomy Care Assessment and Management (repouching, etc.) PROCESS - Coordination of Care X - Simple Patient / Family Education for ongoing care 1 15 []  - 0 Complex (extensive) Patient / Family Education for ongoing care []  - 0 Staff obtains , Records, Test Results / Process Orders []  - 0 Staff telephones HHA, Nursing Homes / Clarify orders / etc []  - 0 Routine Transfer to another Facility (non-emergent condition) []  - 0 Routine Hospital Admission (non-emergent condition) []  - 0 New Admissions / / Ordering NPWT, Apligraf, etc. []  - 0 Emergency Hospital Admission (emergent condition) X- 1 10 Simple Discharge Coordination []  - 0 Complex (extensive) Discharge Coordination PROCESS - Special Needs []  - Pediatric / Minor Patient Management 0 []  - 0 Isolation Patient Management []  - 0 Hearing / Language / Visual special needs []  - 0 Assessment of Community assistance (transportation, D/C planning, etc.) []  - 0 Additional assistance / Altered mentation []  - 0 Support Surface(s) Assessment (bed, cushion, seat, etc.) INTERVENTIONS - Wound Cleansing / Measurement []  - Wound  Imaging (photographs - any number of wounds) 0 []  - 0 Wound Tracing (instead of photographs) []  - 0 Simple Wound Measurement - one wound []  - 0 Complex Wound Measurement - multiple wounds [name] ( ) []  - 0 Simple Wound Cleansing - one wound []  - 0 Complex Wound Cleansing - multiple wounds INTERVENTIONS - Wound Dressings []  -  Small Wound Dressing one or multiple wounds 0 []  - 0 Medium Wound Dressing one or multiple wounds []  - 0 Large Wound Dressing one or multiple wounds []  - 0 Application of Medications - injection INTERVENTIONS - Miscellaneous []  - External ear exam 0 []  - 0 Specimen Collection (cultures, biopsies, blood, body fluids, etc.) []  - 0 Specimen(s) / Culture(s) sent or taken to Lab for analysis []  - 0 Patient Transfer (multiple staff / Lift / Similar devices) []  - 0 Simple Staple / Suture removal (25 or less) []  - 0 Complex Staple / Suture removal (26 or more) []  - 0 Hypo / Hyperglycemic Management (close monitor of Blood Glucose) X- 1 15 Ankle / Brachial Index (ABI) - do not check if billed separately Has the patient been seen at the hospital within the last three years: Yes Total Score: 90 Level Of Care: New/Established - Level 3 Electronic Signature(s) Signed: 12/28/2019 4:49:15 PM By: Entered By: on 12/28/2019 14:12:42 ( ) -------------------------------------------------------------------------------- Encounter Discharge Information Details Patient Name: [name] Date of Service: 12/28/2019 12:45 PM Medical Record Number: Patient Account Number: Date of Birth/Sex: 10/25/1930 (86 y.o. M) Treating RN: [name] Primary Care [name]: 12/30/2019, Jeffery Other Clinician: Referring [name]: [name] Treating [name]/Extender: [id number], Jeffery Weeks in Treatment: 0 Encounter Discharge Information Items Discharge Condition: Stable Ambulatory  Status: Walker Discharge Destination: Home Transportation: Private Auto Accompanied By: brother Schedule Follow-up Appointment: Yes Clinical Summary of Care: Electronic Signature(s) Signed: 12/28/2019 4:49:15 PM By: 12/30/2019 Entered By: [id number] on 12/28/2019 14:13:30 12/13/1930 (12-01-1976) -------------------------------------------------------------------------------- Lower Extremity Assessment Details Patient Name: [name] Date of Service: 12/28/2019 12:45 PM Medical Record Number: [name] Patient Account Number: [name] Date of Birth/Sex: December 22, 1930 (86 y.o. M) Treating RN: [name] Primary Care Giang Hemme: 12/30/2019, Jeffery Other Clinician: Referring Analy Bassford: [name] Treating Shruthi Northrup/Extender: [id number], Jeffery Weeks in Treatment: 0 Edema Assessment Assessed: [Left: Yes] [Right: Yes] Edema: [Left: Yes] [Right: Yes] Calf Left: Right: Point of Measurement: 33 cm From Medial Instep 49 cm 57 cm Ankle Left: Right: Point of Measurement: 7 cm From Medial Instep 33.5 cm 35 cm Vascular Assessment Blood Pressure: Brachial: [Left:152] [Right:158] Ankle: [Left:Dorsalis Pedis: 108 0.68] [Right:Dorsalis Pedis: 154 0.97] Electronic Signature(s) Signed: 12/28/2019 4:49:15 PM By: 12/30/2019 Entered By: [id number] on 12/28/2019 13:57:45 Casimir, Jeffie (12/13/1930) -------------------------------------------------------------------------------- Multi Wound Chart Details Patient Name: 12-01-1976 Date of Service: 12/28/2019 12:45 PM Medical Record Number: [name] Patient Account Number: [name] Date of Birth/Sex: August 11, 1930 (86 y.o. M) Treating RN: [name] Primary Care Akhilesh Sassone: [name], Jeffery Other Clinician: Referring Carlissa Pesola: 12/30/2019, Jeffery Treating Martel Galvan/Extender: [id number], Jeffery Weeks in Treatment: 0 Vital Signs Height(in): 71 Pulse(bpm): 105 Weight(lbs): Blood Pressure(mmHg): 146/62 Body Mass  Index(BMI): Temperature(F): 99.3 Respiratory Rate(breaths/min): 22 Wound Assessments Treatment Notes Electronic Signature(s) Signed: 12/28/2019 4:49:15 PM By: 12/30/2019 Entered By: [id number] on 12/28/2019 14:07:31 12/13/1930 (12-01-1976) -------------------------------------------------------------------------------- Multi-Disciplinary Care Plan Details Patient Name: [name] Date of Service: 12/28/2019 12:45 PM Medical Record Number: [name] Patient Account Number: [name] Date of Birth/Sex: 08-20-1930 (86 y.o. M) Treating RN: [name] Primary Care Jonanthan Bolender: 12/30/2019, Jeffery Other Clinician: Referring Gracelin Weisberg: [name] Treating Deo Mehringer/Extender: [id number], Jeffery Weeks in Treatment: 0 Active Inactive Orientation to the Wound Care Program Nursing Diagnoses: Knowledge deficit related to the wound healing center program Goals: Patient/caregiver will verbalize understanding of the Wound Healing Center Program Date Initiated: 12/28/2019 Target Resolution Date: 12/28/2019 Goal Status: Active Interventions: Provide education on  orientation to the wound center Notes: Tissue Oxygenation Nursing Diagnoses: Potential alteration in peripheral tissue perfusion (select prior to confirmation of diagnosis) Goals: Patient/caregiver will verbalize understanding of disease process and disease management Date Initiated: 12/28/2019 Target Resolution Date: 01/11/2020 Goal Status: Active Interventions: Assess patient understanding of disease process and management upon diagnosis and as needed Notes: Wound/Skin Impairment Nursing Diagnoses: Impaired tissue integrity Goals: Patient/caregiver will verbalize understanding of skin care regimen Date Initiated: 12/28/2019 Target Resolution Date: 01/11/2020 Goal Status: Active Interventions: Assess ulceration(s) every visit Notes: Electronic Signature(s) Signed: 12/28/2019 4:49:15 PM By: [name] Entered By: [name] on 12/28/2019 14:07:00 Hamler, Tarrence ([id number]) -------------------------------------------------------------------------------- Non-Wound Condition Assessment Details Patient Name: [name] Date of Service: 12/28/2019 12:45 PM Medical Record Number: [id number] Patient Account Number: [id number] Date of Birth/Sex: 01/06/1931 (86 y.o. M) Treating RN: [name] Primary Care Rachelann Enloe: [name], Jeffery Other Clinician: Referring Jeromey Kruer: [name], Jeffery Treating Markeeta Scalf/Extender: STONE [name], Jeffery Weeks in Treatment: 0 Non-Wound Condition: Condition: Lymphedema Location: Leg Side: Bilateral Notes: bilateral lower leg lymphadema Photos Electronic Signature(s) Signed: 12/28/2019 4:49:15 PM By: [name] Entered By: [name] on 12/28/2019 13:58:37 Shinall, Torrez ([id number]) -------------------------------------------------------------------------------- Pain Assessment Details Patient Name: [name] Date of Service: 12/28/2019 12:45 PM Medical Record Number: [id number] Patient Account Number: [id number] Date of Birth/Sex: 1931-01-06 (86 y.o. M) Treating RN: [name] Primary Care Gerardine Peltz: [name], Jeffery Other Clinician: Referring Emmanuelle Coxe: [name], Jeffery Treating Zyairah Wacha/Extender: [name], Jeffery Weeks in Treatment: 0 Active Problems Location of Pain Severity and Description of Pain Patient Has Paino No Site Locations Pain Management and Medication Current Pain Management: Electronic Signature(s) Signed: 12/28/2019 4:49:15 PM By: [name] Entered By: [name] on 12/28/2019 13:12:57 [name] ([id number]) -------------------------------------------------------------------------------- Patient/Caregiver Education Details Patient Name: [name] Date of Service: 12/28/2019 12:45 PM Medical Record Number: [id number] Patient Account Number: [id number] Date of Birth/Gender: 07-12-1930 (86 y.o.  M) Treating RN: [name] Primary Care Physician: [name], Jeffery Other Clinician: Referring Physician: Birdie [name], Jeffery Treating Physician/Extender: Skeet Simmer in Treatment: 0 Education Assessment Education Provided To: Patient Education Topics Provided Welcome To The Wound Care Center: Handouts: Welcome To The [facility] Methods: Demonstration, Explain/Verbal Responses: State content correctly Electronic Signature(s) Signed: 12/28/2019 4:49:15 PM By: [name] Entered By: [name] on 12/28/2019 14:13:05 [name] ([id number]) -------------------------------------------------------------------------------- Vitals Details Patient Name: [name] Date of Service: 12/28/2019 12:45 PM Medical Record Number: [id number] Patient Account Number: [id number] Date of Birth/Sex: 1930/09/12 (86 y.o. M) Treating RN: [name] Primary Care Linh Johannes: [name], Jeffery Other Clinician: Referring Jezebelle Ledwell: [name], Jeffery Treating Krysti Hickling/Extender: [name], Jeffery Weeks in Treatment: 0 Vital Signs Time Taken: 13:13 Temperature (F): 99.3 Height (in): 71 Pulse (bpm): 105 Source: Stated Respiratory Rate (breaths/min): 22 Blood Pressure (mmHg): 146/62 Reference Range: 80 - 120 mg / dl Electronic Signature(s) Signed: 12/28/2019 4:49:15 PM By: [name] Entered By: [name] on 12/28/2019 13:14:08

## 2020-01-07 ENCOUNTER — Ambulatory Visit: Payer: Medicare Other | Attending: Physician Assistant | Admitting: Occupational Therapy

## 2020-01-07 ENCOUNTER — Encounter: Payer: Self-pay | Admitting: Occupational Therapy

## 2020-01-07 ENCOUNTER — Other Ambulatory Visit: Payer: Self-pay

## 2020-01-07 DIAGNOSIS — I890 Lymphedema, not elsewhere classified: Secondary | ICD-10-CM | POA: Diagnosis not present

## 2020-01-07 NOTE — Patient Instructions (Signed)
Lymphedema Precautions   Dopler study required prior to participating in Complete Decongestive Therapy (CDT) for lymphedema (LE) care  to rule out DVT   If you experience atypical shortness of breath, or notice any signs /symptoms of skin infection (aka cellulitis) remove all compression wraps/ garments, discontinue manual lymphatic drainage (MLD), and report symptoms to your physician immediately. Discontinue MLD and compression for 72 hours after you take your first oral antibiotic so not to spread the infection.   Lymphedema Self- Care Instructions  1. EXERCISE: Perform lymphatic pumping there ex 2 x a [place]. While wearing your compression wraps or garments. Perform 10 reps of each exercise bilaterally and be sure to perform them in order. Don;t skip around!  OMIT PARTIAL SIT UP  2. MLD: Perform simple self-Manual Lymphatic Drainage (MLD) at least once a [place] as directed.  3. WRAPS: Compression wraps are to be worn 23 hrs/ 7 days/wk during Intensive Phase of Complete Decongestive Therapy (CDT).Building tolerance may take time and practice, so don't get discouraged. If bandages begin to feel tight during periods of inactivity and/or during the night, try performing your exercises to loosen them.   4. GARMENTS: During Management Phase CDT your compression garments are to be worn during waking hours when active. Do NOT sleep in your garments!!   5. PUT YOUR FEET UP! Elevate your feet and legs and feet to the level of your heart whenever you are sitting down.   6. SKIN: Carefully monitor skin condition and perform impeccable hygiene daily. Bathe skin with mild soap and water and apply low pH lotion (aka Eucerin ) to improve hydration and limit infection risk.    Lymphatic Pumping Exercises:            

## 2020-01-07 NOTE — Therapy (Signed)
[facility] MAIN [facility] SERVICES [address] [place], [place], [address] Phone: [phone]   Fax:  [phone]  Occupational Therapy Evaluation  Patient Details  Name: [name] MRN: [id number] Date of Birth: 1931-01-20 Referring Provider (OT): [name] , [place]   Encounter Date: 01/07/2020   OT End of Session - 01/07/20 1706    Visit Number 1    Number of Visits 36    Date for OT Re-Evaluation 04/06/20           Past Medical History:  Diagnosis Date  . (HFpEF) heart failure with preserved ejection fraction (HCC)    a. 03/2018 Echo: EF 60-65%, Gr1 DD.  Marland Kitchen Aortic atherosclerosis (HCC) 05/2013   Per TEE  . Arrhythmia   . Asthma   . CHF (congestive heart failure) (HCC)   . Colon polyp   . Coronary artery disease    a. Nonobstructive by 2004 cath b. 05/2013 NSTEMI/Cath: >M 30, LAD 45m, LCX 63m, RCA 30p, EF 60%, ? Sev AS (mod by TEE).  Marland Kitchen GERD (gastroesophageal reflux disease)   . Heart murmur   . Hiatal hernia   . Hyperlipidemia   . Hypertension   . Lymphedema   . Mitral regurgitation    a. 03/2018 Echo: Mild to mod Jeffery.  . Obesity   . Onychomycosis   . Osteoarthritis   . Recurrent cellulitis of lower extremity   . Severe aortic stenosis    a. 2013 Echo: EF 55%, mod AS; b. TEE 05/2013: EF EF 60-65%, mod AS; c.10/2014 Echo: 55-60%, mod AS; d. 10/2016 Echo: EF 65-70%, Mod-Sev AS; e. 03/2018 Ehco: EF 60-65%, Sev AS, mild to mod AI. Mean grad (S) , Valve Area (VTI): 0.92cm^2.    Past Surgical History:  Procedure Laterality Date  . CARDIAC CATHETERIZATION  11/2002  . CARDIAC CATHETERIZATION  05/21/2013   showing 30% oLM, 30% D1, 20% mLCx, 30% pRCA; EF 60%, severe AS (mean gradient 28 mmHg, peak 26, area 0.9)  . CHOLECYSTECTOMY  2001  . TRANSESOPHAGEAL ECHOCARDIOGRAM  05/22/2013   Preserved EF, moderate AS (valve area by planimetry between 1.0-1.26 cm sq), moderate aortic arch and descending aortic atherosclerosis.   . TRANSTHORACIC  ECHOCARDIOGRAM  05/19/2013   EF 60-65%, impaired diastolic function, mild LA dilatation, mild Jeffery/AI/TR, mod AS, high normal RVSP (30.4 mmHg)    There were no vitals filed for this visit.   Subjective Assessment - 01/07/20 1050    Subjective  [name] is referred to Occupational Therapy by Jeffery Mayotte, PA-C for evaluation and treatmenty of BLE lymphedema (LE). Pt is accompanied by his brother, [name], who drives him to the clinic. [name] is an unreliable historian, his brother assists with the interview. [name] reports that leg swelling started ~ 2 years ago, and his brother has had multiple episodes of cellulitis and lymphorrhea. [name] reports that Jeffery. [name] has what sounds like a basic sequential pneumatic compression device, or "pump" but he does not use it often. The Pt repoprts [name] never elevates his legs when seated and he does not wear compression stockings. [name] reports that the Pt is typically sedentary, only walking to the bathroom, and sometimes "not making it on time" soiling clothing. Pt  has not undergone formal lymphedema treatment previously. His goal for treatment is to reduce the swelling in his legs.    Patient is accompanied by: Family member    Pertinent History BLE lymphedema (LE), R>L; hx recurrent cellulitis; s/p recent R posterior  leg wound, hx falls; Obesity, HTN, CAD, asthma; severe aortic stenosis; chronic combined CHF, recent Doppler negative for DVT; former smoker, HOH    Limitations chronic BLE swelling and associated pain, falls risk, infection risk, urinary incontinnce, difficulty walking, impaired gait, impaired balance, decreased endurance, limited BLE AROM 2/2 tissue approximation (girth), impaired functional mobility/transfers, impaired short term memory    Repetition Increases Symptoms    Special Tests + Stemmer bilaterally, R>L    Patient Stated Goals get swelling down in my legs    Currently in Pain? Yes    Pain Score --   pain not rated numerically   Pain Location  Leg    Pain Orientation Right;Left    Pain Descriptors / Indicators Pressure;Heaviness;Tightness;Tiring;Discomfort    Pain Type Chronic pain    Pain Onset --   leg pain and swelling started ~ 2 years ago- no known precipitating event.   Pain Frequency Intermittent    Aggravating Factors  standing, walking, dependent positioning, infections    Pain Relieving Factors --             [facility] OT Assessment - 01/07/20 0001      Assessment   Medical Diagnosis LLE moderate , stage II lymphedema (LE); RLE severe, stage II-mild stage III lymphedema 2/2 suspected venous disease. Contributing factors = obesity, sedentary lifestyle w/ dependent seated positioning > 115 hrs/Venable    Referring Provider (OT) [name] , PA-C    Hand Dominance Right    Prior Therapy basic pump- not used, no compression garments      Precautions   Precautions Fall;Other (comment)   Lymphedema skin. cardiac, pulmonary      Balance Screen   Has the patient fallen in the past 6 months No    Has the patient had a decrease in activity level because of a fear of falling?  Yes      Home  Environment   Family/patient expects to be discharged to: Private residence    Living Arrangements --   lives with stepdaughter and  her son   Available Help at Discharge Other (Comment)   younger brother   Type of Home Aartment    Home Access Elevator    Home Layout One level    Alternate Level Stairs - Number of Steps --   elevator   Bathroom Shower/Tub Tub/Shower unit    Information systems manager    How accessible Accessible via walker    Home Equipment Walker - 2 wheels    Lives With Family      Prior Function   Level of Independence Needs assistance with transfers;Needs assistance with gait;Needs assistance with homemaking;Needs assistance with ADLs    Vocation Retired    [employer] sedentary, TV all Clausing and evening      IADL   Prior Level of Function Shopping Max A    Shopping Completely unable to shop    Prior  Level of Function Light Housekeeping Max A    Light Housekeeping Does not participate in any housekeeping tasks    Prior Level of Function Meal Prep Max A    Meal Prep Needs to have meals prepared and served;Does not utilize stove or oven      Mobility   Mobility Status History of falls      Cognition   Overall Cognitive Status History of cognitive impairments - at baseline      Observation/Other Assessments   Focus on Therapeutic Outcomes (FOTO)  Intake score =45/100.  Outcome Measures BLE comparative limb volumetrics      Posture/Postural Control   Posture/Postural Control No significant limitations      Sensation   Light Touch Appears Intact      ROM / Strength   AROM / PROM / Strength AROM;Strength      AROM   Overall AROM  Deficits    Overall AROM Comments limb girth limits knee and akle flexion at LLE 2/2 tissue approximation      Strength   Overall Strength Deficits    Overall Strength Comments difficulty lifting legs due to excessive weight and decreased srength            LYMPHEDEMA/ONCOLOGY QUESTIONNAIRE - 01/07/20 0001      Lymphedema Stage   Stage STAGE 2 SPONTANEOUSLY IRREVERSIBLE      Lymphedema Assessments   Lymphedema Assessments Lower extremities            Severe stage II-mild stage III, RLE lymphedema and moderate stage II LLE lymphedema  2/2 suspected CVI, Obesity   Skin  Description Hyper-Keratosis Peau' de Orange Shiny Tight Fibrotic/ Indurated Fatty Doughy spongy   x x  x  x         Skin dry Flaky Erythema Macerated   x x x    Color Redness Present Pallor Blanching Hemosiderin Staining Other   x   x     Odor Malodorous Yeast Fungal infection  Absent      x   Temperature Warm Cool wnl    x      Pitting Edema   1+ 2+ 3+ 4+ Non-pitting     Distal legs anteriorly      Girth Symmetrical Asymmetrical                   Distribution    R>L  Toes to groin with densest concentration below knees    Stemmer Sign  Positive Negative    Strong Bly    Lymphorrhea History Of:  Present Absent   x   x   Wounds History Of Present Absent Venous Arterial Pressure Size    x  X          Signs of Infection Redness Warmth Erythema Acute Swelling Drainage Borders    absent                Sensation Light Touch Deep pressure Hypersensitivty   Present Impaired Present Impaired Absent Impaired   x    x     Nails WNL Fungus Other    x   x Hair Growth Symmetrical Asymmetrical    x   Skin Creases Base of toes  Ankles   Base of Fingers Medial Thighs         Abdominal pannus Medial legs     x x                OT Treatments/Exercises (OP) - 01/07/20 0001      Transfers   Transfers Sit to Stand;Stand to Sit    Sit to Stand Multiple attempts;With armrests;5: Supervision    Stand to Sit Uncontrolled descent;With armrests      ADLs   Grooming unable to groom nails, perform skin care, inspect skin on feet and distal legs, bathe feet and distal legs    LB Dressing Mod A    Bathing impaired LB bathing; unable to complete tub/shower transfer; performs seated  sink bath    Functional Mobility all transfers impaired  Driving unable    ADL Education Given Yes      Manual Therapy   Manual Therapy Edema management                      OT Long Term Goals - 01/07/20 1709      OT LONG TERM GOAL #1   Title With Max CG assistance Pt will be able to apply knee length, multi-layer, short stretch compression wraps daily from ankle to tibial tuberosity on one leg at a time using correct gradient techniques to return affected limb/s, as closely as possible, to premorbid size and shape, to limit leg pain and infection risk, and to improve safe functional mobility and ADLs performance.    Baseline dependent    Time 4    Period Days    Status New    Target Date --   4th OT Rx visist     OT LONG TERM GOAL #2   Title Pt to achieve at least 10% LLE limb volume reduction bilaterally during  Intensive Phase CDT  to improve functional performance of basic and instrumental ADLs, and to limit LE progression.    Baseline Dependent    Time 12    Period Weeks    Status New    Target Date 04/06/20      OT LONG TERM GOAL #3   Title With Max daily CG assistance daily  Pt will sustain than 85% compliance with all daily LE self-care home program components throughout Intensive Phase CDT, including impeccable skin care, lymphatic pumping ther ex,  compression wraps and simple self MLD, to ensure optimal limb volume reduction, to limit infection risk and to limit LE progression.    Baseline dependent    Time 12    Period Weeks    Status New    Target Date 04/06/20      OT LONG TERM GOAL #4   Title With Max CG assistance Pt will be able to don and doff appropriate daytime compression garments to limit edema re-accumulation and  further progression by issue date.    Baseline dependent    Time 12    Period Weeks    Status New    Target Date 04/06/20      OT LONG TERM GOAL #5   Title After skilled teaching Pt  will be able to perform all lymphedema self-care home program components with max CG assistance , including simple self MLD, lymphatic pumping exercises, don, doff and wear compression garments  daily, sustain   impeccable skin care daily, to limit lymphedema progression and infection risk.    Baseline dependent    Time 12    Period Weeks    Status New    Target Date 04/06/20      OT LONG TERM GOAL #6   Title Pt's functional performance will improve w/ OT for CDT asd evidenced by increase in FOTO score of 10 points by nec of INtensive Phase CDT.    Baseline dependent    Time 12    Period Weeks    Status New    Target Date 04/06/20                 Plan - 01/07/20 1628    Clinical Impression Statement Aydn Lograsso is an 31 y o male presenting with severe, stage II-mild stage III RLE lymphedema, and moderate-severe stage II LLE lymphedema concentrated primarily below the  knees 2/2 CVI, obesity and dependent positioning  greater than 15 hours /[place]eslin and sometimes over night.  Pt presents with skin changes associated with progressive LE, including dense fibrosis and lymphatic cysts,  associated with a hx of lymphedema typically greater than reported 2 years. Legs are reddened, dry and indurated. Patient's Physical FS Primary Measure 45 Patient's intake functional measure is 45 out of 100 (higher number = greater function).Risk Adjusted Statistical FOTO* 54 Given the patient's risk-adjustment variables, like-patients nationally had a FS score of 54 at intakePt has what sounds like a basic sequential pneumatic compression device with thigh length garments, but brother reports he does not use the device. He does not wear compression garments and does not elevate legs. Lymphedema limits Pt's ability to perform safe funtional transfers and ambulation, basic self care and instrumental ADLs, productive activities and leisure pursuits.    OT Occupational Profile and History Comprehensive Assessment- Review of records and extensive additional review of physical, cognitive, psychosocial history related to current functional performance    Occupational performance deficits (Please refer to evaluation for details): ADL's;IADL's;Rest and Sleep;Work;Leisure;Social Participation    Body Structure / Function / Physical Skills ADL;ROM;IADL;Pain;Edema;Skin integrity;Gait;Mobility;Decreased knowledge of use of DME    Rehab Potential Fair    Clinical Decision Making Multiple treatment options, significant modification of task necessary    Comorbidities Affecting Occupational Performance: Presence of comorbidities impacting occupational performance    Comorbidities impacting occupational performance description: systemic fluid retention, venous insufficiency, obesity    Modification or Assistance to Complete Evaluation  Max significant modification of tasks or assist is necessary to complete    OT  Frequency 3x / week    OT Duration 12 weeks   and PRN, treatning one leg at a time to limit falls risk   OT Treatment/Interventions Self-care/ADL training;Therapeutic exercise;Manual lymph drainage;Patient/family education;Compression bandaging;Functional Mobility Training;Scar mobilization;Therapeutic activities;DME and/or AE instruction;Manual Therapy    Plan fit with appropriate daytime compression garments. Consider adjustable, vecro wrap style CircAids    Consulted and Agree with Plan of Care Patient;Family member/caregiver    Family Member Consulted brother Jeffery RungJoe. Pt and brother will discuss POC and call to schedule if they decide to return to OT for CDT. This therapy course has a high burden of care.           Patient will benefit from skilled therapeutic intervention in order to improve the following deficits and impairments:   Body Structure / Function / Physical Skills: ADL, ROM, IADL, Pain, Edema, Skin integrity, Gait, Mobility, Decreased knowledge of use of DME       Visit Diagnosis: Lymphedema, not elsewhere classified - Plan: Ot plan of care cert/re-cert    Problem List Patient Active Problem List   Diagnosis Date Noted  . Right leg swelling 12/03/2019  . Hyperbilirubinemia 12/03/2019  . Cellulitis 03/17/2018  . Acute on chronic heart failure (HCC) 10/19/2017  . Poor social situation 09/06/2017  . Chronic diastolic heart failure (HCC)   . Venous ulcer (HCC) 07/08/2017  . Osteoarthritis 05/28/2017  . Varicose veins of both lower extremities with pain 04/10/2017  . Iron deficiency anemia 11/25/2016  . Chronic venous insufficiency 10/04/2016  . Lymphedema 06/08/2016  . Morbid obesity (HCC) 03/26/2015  . Coronary artery disease, non-occlusive 06/05/2013  . Hypertension 06/05/2013  . Hyperlipidemia 02/23/2012  . Diabetes mellitus (HCC) 02/23/2012  . Aortic valve stenosis, severe 02/23/2012    Loel Dubonnetheresa Kylar Leonhardt, MS, OTR/L, [facility]LANA 01/07/20 5:28 PM  [facility] MAIN [facility] [address] [place], [place], [id number]  Phone: [phone]   Fax:  [phone]  Name: Khoa Opdahl MRN: [id number] Date of Birth: [birth year]

## 2020-01-09 ENCOUNTER — Ambulatory Visit: Payer: Medicare Other | Admitting: Occupational Therapy

## 2020-01-14 ENCOUNTER — Ambulatory Visit: Payer: Medicare Other | Admitting: Occupational Therapy

## 2020-01-16 ENCOUNTER — Encounter: Payer: Medicare Other | Admitting: Occupational Therapy

## 2020-01-23 ENCOUNTER — Encounter: Payer: Medicare Other | Admitting: Occupational Therapy

## 2020-01-28 ENCOUNTER — Encounter: Payer: Medicare Other | Admitting: Occupational Therapy

## 2020-01-29 ENCOUNTER — Telehealth: Payer: Self-pay | Admitting: Family Medicine

## 2020-01-29 NOTE — Telephone Encounter (Signed)
This patient was supposed to have a follow-up bilirubin check.  The order was previously placed though does not look like he was scheduled.  Can you please get him scheduled for labs?  Thanks.

## 2020-01-30 ENCOUNTER — Encounter: Payer: Medicare Other | Admitting: Occupational Therapy

## 2020-01-30 NOTE — Telephone Encounter (Signed)
Patient scheduled for 01/31/20 for labs.

## 2020-01-31 ENCOUNTER — Other Ambulatory Visit (INDEPENDENT_AMBULATORY_CARE_PROVIDER_SITE_OTHER): Payer: Medicare Other

## 2020-02-01 LAB — BILIRUBIN, FRACTIONATED(TOT/DIR/INDIR)
Bilirubin, Direct: 0.4 mg/dL — ABNORMAL HIGH (ref 0.0–0.2)
Indirect Bilirubin: 1.3 mg/dL (calc) — ABNORMAL HIGH (ref 0.2–1.2)
Total Bilirubin: 1.7 mg/dL — ABNORMAL HIGH (ref 0.2–1.2)

## 2020-02-04 ENCOUNTER — Encounter: Payer: Medicare Other | Admitting: Occupational Therapy

## 2020-02-06 ENCOUNTER — Encounter: Payer: Medicare Other | Admitting: Occupational Therapy

## 2020-02-08 ENCOUNTER — Telehealth: Payer: Self-pay | Admitting: *Deleted

## 2020-02-08 NOTE — Telephone Encounter (Signed)
Please place future orders for lab appt.  

## 2020-02-09 ENCOUNTER — Other Ambulatory Visit: Payer: Self-pay | Admitting: Family Medicine

## 2020-02-09 DIAGNOSIS — R17 Unspecified jaundice: Secondary | ICD-10-CM

## 2020-02-09 NOTE — Telephone Encounter (Signed)
Orders placed.

## 2020-02-11 ENCOUNTER — Encounter: Payer: Medicare Other | Admitting: Occupational Therapy

## 2020-02-12 ENCOUNTER — Other Ambulatory Visit (INDEPENDENT_AMBULATORY_CARE_PROVIDER_SITE_OTHER): Payer: Medicare Other

## 2020-02-12 ENCOUNTER — Other Ambulatory Visit: Payer: Self-pay

## 2020-02-12 DIAGNOSIS — R17 Unspecified jaundice: Secondary | ICD-10-CM | POA: Diagnosis not present

## 2020-02-12 LAB — HEPATIC FUNCTION PANEL
ALT: 13 U/L (ref 0–53)
AST: 17 U/L (ref 0–37)
Albumin: 3.9 g/dL (ref 3.5–5.2)
Alkaline Phosphatase: 72 U/L (ref 39–117)
Bilirubin, Direct: 0.3 mg/dL (ref 0.0–0.3)
Total Bilirubin: 1.7 mg/dL — ABNORMAL HIGH (ref 0.2–1.2)
Total Protein: 6.7 g/dL (ref 6.0–8.3)

## 2020-02-12 LAB — CBC
HCT: 44.2 % (ref 39.0–52.0)
Hemoglobin: 14.7 g/dL (ref 13.0–17.0)
MCHC: 33.4 g/dL (ref 30.0–36.0)
MCV: 81.3 fl (ref 78.0–100.0)
Platelets: 153 10*3/uL (ref 150.0–400.0)
RBC: 5.43 Mil/uL (ref 4.22–5.81)
RDW: 14.8 % (ref 11.5–15.5)
WBC: 6.9 10*3/uL (ref 4.0–10.5)

## 2020-02-13 ENCOUNTER — Encounter: Payer: Medicare Other | Admitting: Occupational Therapy

## 2020-02-13 LAB — RETICULOCYTES
ABS Retic: 44800 cells/uL (ref 25000–9000)
Retic Ct Pct: 0.8 %

## 2020-02-13 LAB — HAPTOGLOBIN: Haptoglobin: 105 mg/dL (ref 43–212)

## 2020-02-14 ENCOUNTER — Ambulatory Visit
Admission: RE | Admit: 2020-02-14 | Discharge: 2020-02-14 | Disposition: A | Discharge status: Home / Self Care | Payer: Medicare Other | Source: Ambulatory Visit | Attending: Family Medicine | Admitting: Family Medicine

## 2020-02-14 ENCOUNTER — Other Ambulatory Visit: Payer: Self-pay

## 2020-02-14 DIAGNOSIS — R17 Unspecified jaundice: Secondary | ICD-10-CM | POA: Insufficient documentation

## 2020-04-02 ENCOUNTER — Other Ambulatory Visit: Payer: Self-pay

## 2020-04-02 ENCOUNTER — Ambulatory Visit (INDEPENDENT_AMBULATORY_CARE_PROVIDER_SITE_OTHER): Payer: Medicare Other | Admitting: Family

## 2020-04-02 ENCOUNTER — Encounter: Payer: Self-pay | Admitting: Family

## 2020-04-02 ENCOUNTER — Telehealth: Payer: Self-pay | Admitting: Family Medicine

## 2020-04-02 VITALS — BP 132/60 | HR 112 | Ht 71.0 in | Wt 303.0 lb

## 2020-04-02 DIAGNOSIS — E782 Mixed hyperlipidemia: Secondary | ICD-10-CM | POA: Diagnosis not present

## 2020-04-02 DIAGNOSIS — I5032 Chronic diastolic (congestive) heart failure: Secondary | ICD-10-CM

## 2020-04-02 DIAGNOSIS — I509 Heart failure, unspecified: Secondary | ICD-10-CM

## 2020-04-02 DIAGNOSIS — I350 Nonrheumatic aortic (valve) stenosis: Secondary | ICD-10-CM

## 2020-04-02 DIAGNOSIS — I25119 Atherosclerotic heart disease of native coronary artery with unspecified angina pectoris: Secondary | ICD-10-CM | POA: Diagnosis not present

## 2020-04-02 DIAGNOSIS — I2510 Atherosclerotic heart disease of native coronary artery without angina pectoris: Secondary | ICD-10-CM

## 2020-04-02 MED ORDER — TORSEMIDE 20 MG PO TABS: 40.0000 mg | ORAL_TABLET | Freq: Two times a day (BID) | ORAL | 1 refills | Status: DC

## 2020-04-02 MED ORDER — POTASSIUM CHLORIDE CRYS ER 20 MEQ PO TBCR: 20.0000 meq | EXTENDED_RELEASE_TABLET | Freq: Every day | ORAL | 3 refills | Status: DC

## 2020-04-02 MED ORDER — CLOPIDOGREL BISULFATE 75 MG PO TABS: 75.0000 mg | ORAL_TABLET | Freq: Every day | ORAL | 3 refills | Status: DC

## 2020-04-02 MED ORDER — METOPROLOL SUCCINATE ER 25 MG PO TB24: 25.0000 mg | ORAL_TABLET | Freq: Every day | ORAL | 3 refills | Status: DC

## 2020-04-02 NOTE — Progress Notes (Signed)
Office Visit    Patient Name: [name] Date of Encounter: 04/02/2020  Primary Care Provider:  Leone Haven, MD Primary Cardiologist:  Ida Rogue, MD Electrophysiologist:  None   Chief Complaint    [name] is a 86 y.o. male with a hx of chronic diastolic heart failure, CAD, prior tobacco use, lymphedema, GERD, HTN, HLD moderate MR, severe aortic stenosis presents today for overdue follow-up of severe aortic stenosis, HTN, CAD, diastolic heart failure.  Past Medical History    Past Medical History:  Diagnosis Date  . (HFpEF) heart failure with preserved ejection fraction ([place])    a. 03/2018 Echo: EF 60-65%, Gr1 DD.  Marland Kitchen Aortic atherosclerosis ([place]) 05/2013   Per TEE  . Arrhythmia   . Asthma   . CHF (congestive heart failure) ([place])   . Colon polyp   . Coronary artery disease    a. Nonobstructive by 2004 cath b. 05/2013 NSTEMI/Cath: >M 30, LAD 34m LCX 242mRCA 30p, EF 60%, ? Sev AS (mod by TEE).  . Marland KitchenERD (gastroesophageal reflux disease)   . Heart murmur   . Hiatal hernia   . Hyperlipidemia   . Hypertension   . Lymphedema   . Mitral regurgitation    a. 03/2018 Echo: Mild to mod MR.  . Obesity   . Onychomycosis   . Osteoarthritis   . Recurrent cellulitis of lower extremity   . Severe aortic stenosis    a. 2013 Echo: EF 55%, mod AS; b. TEE 05/2013: EF EF 60-65%, mod AS; c.10/2014 Echo: 55-60%, mod AS; d. 10/2016 Echo: EF 65-70%, Mod-Sev AS; e. 03/2018 Ehco: EF 60-65%, Sev AS, mild to mod AI. Mean grad (S) 5069m, Valve Area (VTI): 0.92cm^2.   Past Surgical History:  Procedure Laterality Date  . CARDIAC CATHETERIZATION  11/2002  . CARDIAC CATHETERIZATION  05/21/2013   showing 30% oLM, 30% D1, 20% mLCx, 30% pRCA; EF 60%, severe AS (mean gradient 28 mmHg, peak 26, area 0.9)  . CHOLECYSTECTOMY  2001  . TRANSESOPHAGEAL ECHOCARDIOGRAM  05/22/2013   Preserved EF, moderate AS (valve area by planimetry between 1.0-1.26 cm sq), moderate aortic arch and descending aortic  atherosclerosis.   . TRANSTHORACIC ECHOCARDIOGRAM  05/19/2013   EF 60-65%, impaired diastolic function, mild LA dilatation, mild MR/AI/TR, mod AS, high normal RVSP (30.4 mmHg)    Allergies  Allergies  Allergen Reactions  . Aspirin Rash and Other (See Comments)    Reaction: Trouble breathing     History of Present Illness    Per Stelzer is a 89 61o. male with a hx of chronic diastolic heart failure, CAD, prior tobacco use, lymphedema, GERD, HTN, HLD moderate MR, severe aortic stenosis last [facility]  [facility] with [facility] and treated with IV Lasix.  Echo 03/2018 EF 65%09%rade 1 diastolic dysfunction, severe aortic stenosis mean gradient 50 mmHg, peak gradient 88 mmHg, AVA 0.92 cm grade, dimensionless index 0.29.  Mild to moderate MR.  He did not wish to have TAVR work-up at that time.  He was seen by Dr. McaAngelena Form the valve clinic 03/12/2019 with some dyspnea but no change over the last year.  Noted chronic lower extremity edema due to lymphedema.  He wished to defer TAVR work-up at that time.  Over the summer in July 2021 he was treated for cellulitis by his primary care provider with Keflex and doxycycline.  He was last seen December 2020  by Darylene Price, NP in the [facility] heart failure clinic.  He has been lost to follow-up since that time.  Follow-up with his brother.  He reports compliance with all of his medications however, when I called the pharmacy he has not been taking it routinely.  For example, last picked up his Toprol for a 90-[place] supply in April and last picked up his torsemide in January.  He reports dyspnea on exertion and shortness of breath.  Denies chest pain, pressure, tightness.  He endorses lower extremity and abdominal edema.  He is weeping to his left lower extremity today on exam which she was unaware of.  Presents today with his brother however tells me that he is living with his  daughter-in-law and son.  EKGs/Labs/Other Studies Reviewed:   The following studies were reviewed today: Echo 03/2018 Left ventricle: The cavity size was normal. There was moderate    concentric hypertrophy. Systolic function was normal. The    estimated ejection fraction was in the range of 60% to 65%. Wall    motion was normal; there were no regional wall motion    abnormalities. Doppler parameters are consistent with abnormal    left ventricular relaxation (grade 1 diastolic dysfunction).  - Aortic valve: Trileaflet; severely calcified leaflets.    Transvalvular velocity was increased. There was severe stenosis.    There was mild to moderate regurgitation. Mean gradient (S): 50    mm Hg. Valve area (VTI): 0.92 cm^2.  - Mitral valve: There was mild to moderate regurgitation.  - Right ventricle: Systolic function was normal.  - Pulmonary arteries: PA peak pressure: 43 mm Hg (S).   EKG:  EKG is ordered today.  The ekg ordered today demonstrates sinus tachycardia 112 bpm with infrequent PAC, known right bundle branch block, unable to exclude left atrial enlargement.  Recent Labs: 11/28/2019: BUN 16; Creatinine, Ser 0.93; Potassium 4.1; Sodium 142 02/12/2020: ALT 13; Hemoglobin 14.7; Platelets 153.0  Recent Lipid Panel    Component Value Date/Time   CHOL 164 11/28/2019 1538   CHOL 151 05/19/2013 0213   TRIG 73.0 11/28/2019 1538   TRIG 58 05/19/2013 0213   HDL 52.20 11/28/2019 1538   HDL 49 05/19/2013 0213   CHOLHDL 3 11/28/2019 1538   VLDL 14.6 11/28/2019 1538   VLDL 12 05/19/2013 0213   LDLCALC 97 11/28/2019 1538   LDLCALC 90 05/19/2013 0213     Home Medications   Current Meds  Medication Sig  . clopidogrel (PLAVIX) 75 MG tablet Take 1 tablet (75 mg total) by mouth daily.  . ferrous sulfate 325 (65 FE) MG EC tablet Take 1 tablet (325 mg total) by mouth 2 (two) times daily.  . hydrocerin (EUCERIN) CREA Apply 1 application topically 2 (two) times daily. Apply to bilateral  LEs and feet twice daily after cleansing with house skin cleanser and gently patting dry. No not apply between toes.  . metoprolol succinate (TOPROL-XL) 25 MG 24 hr tablet Take 1 tablet (25 mg total) by mouth daily.  . [DISCONTINUED] atorvastatin (LIPITOR) 80 MG tablet TAKE 1 TABLET BY MOUTH DAILY  . [DISCONTINUED] cephALEXin (KEFLEX) 500 MG capsule Take 1 capsule (500 mg total) by mouth 4 (four) times daily.  . [DISCONTINUED] clopidogrel (PLAVIX) 75 MG tablet TAKE 1 TABLET BY MOUTH DAILY  . [DISCONTINUED] doxycycline (VIBRA-TABS) 100 MG tablet Take 1 tablet (100 mg total) by mouth 2 (two) times daily.  . [DISCONTINUED] losartan (COZAAR) 25 MG tablet TAKE 1 TABLET(25 MG) BY MOUTH DAILY  . [  DISCONTINUED] metoprolol succinate (TOPROL-XL) 25 MG 24 hr tablet TAKE 1 TABLET BY MOUTH EVERY [place]  . [DISCONTINUED] potassium chloride (KLOR-CON) 10 MEQ tablet Take 1 tablet (10 mEq total) by mouth 2 (two) times daily. MUST MAKE APPOINTMENT FOR FURTHER REFILLS     Review of Systems  All other systems reviewed and are otherwise negative except as noted above.  Physical Exam    VS:  BP 132/60   Pulse (!) 112   Ht 5' 11"  (1.803 m)   Wt (!) 303 lb (137.4 kg)   BMI 42.26 kg/m  , BMI Body mass index is 42.26 kg/m.  Wt Readings from Last 3 Encounters:  04/02/20 (!) 303 lb (137.4 kg)  12/05/19 (!) 302 lb 12.8 oz (137.3 kg)  12/03/19 (!) 303 lb 3.2 oz (137.5 kg)     GEN: Well nourished, overweight, well developed, in no acute distress. HEENT: normal. Cardiac: Tachycardic, loud, harsh, late peaking systolic murmur gr 4/6, RRR, no rubs, or gallops. No clubbing, cyanosis.  VVS: Bilateral lower extremities with woody appearing edema.  Left lower extremity with erythema and weeping. Respiratory:  Respirations regular and unlabored.  Diminished bases bilaterally GI: Soft, nontender, nondistended. MS: No deformity or atrophy. Skin: Warm and dry, no rash. Neuro:  Strength and sensation are intact. Psych:  Normal affect.  Assessment & Plan    1. Chronic diastolic heart failure / Severe aortic stenosis - Severe aortic stenosis by echo 03/2018. Stage D severe aortic stenosis with dyspnea, edema. If untreated progression of this disease leads to a life expectancy of typically 12 to 24 months from diagnosis.  He has met with TAVR team and declined TAVR.  Discussed the natural progression of aortic stenosis with him and his brother in clinic today.  Encourage referral to hospice/palliative care which he is agreeable to, I will discuss with his primary care provider. We have tried to simplify his medication regimen today - resume Torsemide 27m BID, Potassium 212m daily, Plavix 7563maily, Toprol 67m39mily. Toprol resumed due to tachycardia on EKG. Will not resume Atorvastatin nor Losartan. BMP, CBC today.   2. Morbid obesity- Weight up 27 pounds in last year likely due to sedentary lifestyle, fluid retention.  3. CAD- Reports no chest pain, pressure, tightness.  EKG with no acute ST/T wave changes. Continue Plavix, Toprol.   4. RBBB -Stable finding on EKG today.  5. HLD- In the setting of worsening severe aortic stenosis-difficulty with medication management, has opted to discontinue his Atorvastatin at this time  6. Lymphedema-contributory to lower extremity edema.  Encouraged to elevate lower extremities when sitting.  Resume torsemide, as above.  Disposition: Follow up in 2 week(s) with Dr. GollRockey Situ   Signed, CaitLoel Dubonnet 04/02/2020, 10:53 AM [place]

## 2020-04-02 NOTE — Telephone Encounter (Signed)
-----   Message from Alver Sorrow, NP sent at 04/02/2020 10:59 AM EST ----- Hello,Mr. Ilyas's aortic stenosis has led to 25 pound weight gain, increase SOB, edema. He was not taking most of his medications and I resumed only those which were pertinent. I discussed palliative care/hospice with him and his brother and they were agreeable. Are you willing to place this referral? If not, any guidance on the best way to facilitate is much appreciated!Karrie Doffing, NP

## 2020-04-02 NOTE — Patient Instructions (Addendum)
Medication Instructions:  Your provider has recommended you make the following change in your medication:   START Torsemide 40mg  (two tablets) twice daily *Take first dose in the morning and second dose at lunch time*  START Potassium (one tablet) daily  START Metoprolol Succinate (25mg ) one tablet daily  RESUME Clopidogrel (Plavix) 75mg  daily  STOP Atorvastatin (Lipitor)  STOP Losartan (Cozaar)  *If you need a refill on your cardiac medications before your next appointment, please call your pharmacy*  Lab Work: Your provider recommends lab work today: CMP, CBC, lipid panel  If you have labs (blood work) drawn today and your tests are completely normal, you will receive your results only by: MyChart Message (if you have MyChart) OR . A paper copy in the mail If you have any lab test that is abnormal or we need to change your treatment, we will call you to review the results.  Testing/Procedures: Your EKG today showed sinus tachycardia, a fast but regular heart beat. Your heart rate was likely elevated due to volume overload. Your Metoprolol will help keep your heart rate controlled, we have sent a refill.  Follow-Up: At [facility], you and your health needs are our priority.  As part of our continuing mission to provide you with exceptional heart care, we have created designated Provider Care Teams.  These Care Teams include your primary Cardiologist (physician) and Advanced Practice Providers (APPs -  Physician Assistants and Nurse Practitioners) who all work together to provide you with the care you need, when you need it.  We recommend signing up for the patient portal called "MyChart".  Sign up information is provided on this After Visit Summary.  MyChart is used to connect with patients for Virtual Visits (Telemedicine).  Patients are able to view lab/test results, encounter notes, upcoming appointments, etc.  Non-urgent messages can be sent to your provider as well.    To learn more about what you can do with MyChart, go to .    Your next appointment:   2 week(s)  The format for your next appointment:   In Person  Provider:   You may see Marland Kitchen, MD or one of the following Advanced Practice Providers on your designated Care Team:   [facility], NP  [email or web address], PA-C  Julien Nordmann, PA-C  Cadence Nicolasa Ducking, Eula Listen  Marisue Ivan, NP  Other Instructions   Elevate your legs when sitting  Call our office with any new swelling or redness  Take medications as directed

## 2020-04-03 LAB — COMPREHENSIVE METABOLIC PANEL
ALT: 10 IU/L (ref 0–44)
AST: 18 IU/L (ref 0–40)
Albumin/Globulin Ratio: 1.4 (ref 1.2–2.2)
Albumin: 3.7 g/dL (ref 3.6–4.6)
Alkaline Phosphatase: 87 IU/L (ref 44–121)
BUN/Creatinine Ratio: 14 (ref 10–24)
BUN: 13 mg/dL (ref 8–27)
Bilirubin Total: 1.5 mg/dL — ABNORMAL HIGH (ref 0.0–1.2)
CO2: 26 mmol/L (ref 20–29)
Calcium: 9.1 mg/dL (ref 8.6–10.2)
Chloride: 109 mmol/L — ABNORMAL HIGH (ref 96–106)
Creatinine, Ser: 0.94 mg/dL (ref 0.76–1.27)
GFR calc Af Amer: 84 mL/min/{1.73_m2} (ref >59)
GFR calc non Af Amer: 73 mL/min/{1.73_m2} (ref >59)
Globulin, Total: 2.6 g/dL (ref 1.5–4.5)
Glucose: 103 mg/dL — ABNORMAL HIGH (ref 65–99)
Potassium: 4.1 mmol/L (ref 3.5–5.2)
Sodium: 145 mmol/L — ABNORMAL HIGH (ref 134–144)
Total Protein: 6.3 g/dL (ref 6.0–8.5)

## 2020-04-03 LAB — LIPID PANEL
Chol/HDL Ratio: 2.8 ratio (ref 0.0–5.0)
Cholesterol, Total: 177 mg/dL (ref 100–199)
HDL: 63 mg/dL (ref >39)
LDL Chol Calc (NIH): 100 mg/dL — ABNORMAL HIGH (ref 0–99)
Triglycerides: 72 mg/dL (ref 0–149)
VLDL Cholesterol Cal: 14 mg/dL (ref 5–40)

## 2020-04-03 LAB — CBC
Hematocrit: 42.8 % (ref 37.5–51.0)
Hemoglobin: 14.6 g/dL (ref 13.0–17.7)
MCH: 27.9 pg (ref 26.6–33.0)
MCHC: 34.1 g/dL (ref 31.5–35.7)
MCV: 82 fL (ref 79–97)
Platelets: 184 10*3/uL (ref 150–450)
RBC: 5.23 x10E6/uL (ref 4.14–5.80)
RDW: 14.3 % (ref 11.6–15.4)
WBC: 5.5 10*3/uL (ref 3.4–10.8)

## 2020-04-03 NOTE — Telephone Encounter (Signed)
See note below. Palliative care referral placed.

## 2020-04-15 ENCOUNTER — Telehealth: Payer: Self-pay | Admitting: Nurse Practitioner

## 2020-04-15 NOTE — Telephone Encounter (Signed)
Spoke with patient regarding the Palliative referral and patient stated that he has a hard time hearing over the phone and requested that I call and speak with his brother [name].  Called brother, no answer - left message with reason for call along with my name and call back number, requesting a return call to schedule visit.

## 2020-04-18 ENCOUNTER — Encounter: Payer: Self-pay | Admitting: Family

## 2020-04-18 ENCOUNTER — Other Ambulatory Visit: Payer: Self-pay

## 2020-04-18 ENCOUNTER — Ambulatory Visit (INDEPENDENT_AMBULATORY_CARE_PROVIDER_SITE_OTHER): Payer: Medicare Other | Admitting: Family

## 2020-04-18 VITALS — BP 140/50 | HR 100 | Ht 71.0 in | Wt 294.0 lb

## 2020-04-18 DIAGNOSIS — I10 Essential (primary) hypertension: Secondary | ICD-10-CM | POA: Diagnosis not present

## 2020-04-18 DIAGNOSIS — I350 Nonrheumatic aortic (valve) stenosis: Secondary | ICD-10-CM | POA: Diagnosis not present

## 2020-04-18 DIAGNOSIS — I890 Lymphedema, not elsewhere classified: Secondary | ICD-10-CM

## 2020-04-18 DIAGNOSIS — Z79899 Other long term (current) drug therapy: Secondary | ICD-10-CM

## 2020-04-18 DIAGNOSIS — I5032 Chronic diastolic (congestive) heart failure: Secondary | ICD-10-CM | POA: Diagnosis not present

## 2020-04-18 NOTE — Progress Notes (Signed)
Office Visit    Patient Name: [name] Date of Encounter: 04/18/2020  Primary Care Provider:  Leone Haven, MD Primary Cardiologist:  Ida Rogue, MD Electrophysiologist:  None   Chief Complaint    [name] is a 86 y.o. male with a hx of chronic diastolic heart failure, CAD, prior tobacco use, lymphedema, GERD, HTN, HLD moderate MR, severe aortic stenosis presents today for overdue follow-up of severe aortic stenosis and diastolic heart failure.  Past Medical History    Past Medical History:  Diagnosis Date  . (HFpEF) heart failure with preserved ejection fraction ([place])    a. 03/2018 Echo: EF 60-65%, Gr1 DD.  Marland Kitchen Aortic atherosclerosis ([place]) 05/2013   Per TEE  . Arrhythmia   . Asthma   . CHF (congestive heart failure) ([place])   . Colon polyp   . Coronary artery disease    a. Nonobstructive by 2004 cath b. 05/2013 NSTEMI/Cath: >M 30, LAD 85m LCX 24mRCA 30p, EF 60%, ? Sev AS (mod by TEE).  . Marland KitchenERD (gastroesophageal reflux disease)   . Heart murmur   . Hiatal hernia   . Hyperlipidemia   . Hypertension   . Lymphedema   . Mitral regurgitation    a. 03/2018 Echo: Mild to mod MR.  . Obesity   . Onychomycosis   . Osteoarthritis   . Recurrent cellulitis of lower extremity   . Severe aortic stenosis    a. 2013 Echo: EF 55%, mod AS; b. TEE 05/2013: EF EF 60-65%, mod AS; c.10/2014 Echo: 55-60%, mod AS; d. 10/2016 Echo: EF 65-70%, Mod-Sev AS; e. 03/2018 Ehco: EF 60-65%, Sev AS, mild to mod AI. Mean grad (S) 5034m, Valve Area (VTI): 0.92cm^2.   Past Surgical History:  Procedure Laterality Date  . CARDIAC CATHETERIZATION  11/2002  . CARDIAC CATHETERIZATION  05/21/2013   showing 30% oLM, 30% D1, 20% mLCx, 30% pRCA; EF 60%, severe AS (mean gradient 28 mmHg, peak 26, area 0.9)  . CHOLECYSTECTOMY  2001  . TRANSESOPHAGEAL ECHOCARDIOGRAM  05/22/2013   Preserved EF, moderate AS (valve area by planimetry between 1.0-1.26 cm sq), moderate aortic arch and descending aortic  atherosclerosis.   . TRANSTHORACIC ECHOCARDIOGRAM  05/19/2013   EF 60-65%, impaired diastolic function, mild LA dilatation, mild MR/AI/TR, mod AS, high normal RVSP (30.4 mmHg)    Allergies  Allergies  Allergen Reactions  . Aspirin Rash and Other (See Comments)    Reaction: Trouble breathing     History of Present Illness    [name] is a [age]o. male with a hx of chronic diastolic heart failure, CAD, prior tobacco use, lymphedema, GERD, HTN, HLD moderate MR, severe aortic stenosis last seen 04/02/20.   [facility] with [facility] and treated with IV Lasix.  Echo 03/2018 EF 65%84%rade 1 diastolic dysfunction, severe aortic stenosis mean gradient 50 mmHg, peak gradient 88 mmHg, AVA 0.92 cm grade, dimensionless index 0.29.  Mild to moderate MR.  He did not wish to have TAVR work-up at that time.  He was seen by Dr. McaAngelena Form the valve clinic 03/12/2019 with some dyspnea but no change over the last year.  Noted chronic lower extremity edema due to lymphedema.  He wished to defer TAVR work-up at that time.  Over the summer in July 2021 he was treated for cellulitis by his primary care provider with Keflex and doxycycline.  Seen in cardiology follow-up 11/72/21.  Prior to that had not been seen by cardiology since  December 2020.  Presents with his brother.  During clinic visit noted that he was not taking medications consistently.  For example had last picked up his torsemide in January at our visit in November.  He noted lower extremity edema and abdominal edema.  His left lower extremity was weeping.  Long discussion regarding severe aortic stenosis and progression.  Discussed with patient and his primary care referred him to palliative care.  His torsemide, potassium, Toprol, Plavix were resumed.  Presents today for follow up with his brother. Weight down 9 pounds from clinic visit 2 weeks ago.  Reports improvement in his lower extremity edema as well as abdominal  bloating.  He denies shortness of breath, dyspnea on exertion.  He is very sedentary and spends most of his [place] watching television.  Reports no chest pain, pressure, tightness. [facility] did reach out to him earlier this week to schedule palliative care referral and he asked them to speak with his brother.  His brother was present in clinic visit today and we will provide the phone number for him to call and he tells me he will do so.  EKGs/Labs/Other Studies Reviewed:   The following studies were reviewed today: Echo 03/2018 Left ventricle: The cavity size was normal. There was moderate    concentric hypertrophy. Systolic function was normal. The    estimated ejection fraction was in the range of 60% to 65%. Wall    motion was normal; there were no regional wall motion    abnormalities. Doppler parameters are consistent with abnormal    left ventricular relaxation (grade 1 diastolic dysfunction).  - Aortic valve: Trileaflet; severely calcified leaflets.    Transvalvular velocity was increased. There was severe stenosis.    There was mild to moderate regurgitation. Mean gradient (S): 50    mm Hg. Valve area (VTI): 0.92 cm^2.  - Mitral valve: There was mild to moderate regurgitation.  - Right ventricle: Systolic function was normal.  - Pulmonary arteries: PA peak pressure: 43 mm Hg (S).   EKG:  EKG is ordered today.  The ekg ordered today demonstrates sinus rhythm 100 bpm withknown right bundle branch block, unable to exclude left atrial enlargement.  Recent Labs: 04/02/2020: ALT 10; BUN 13; Creatinine, Ser 0.94; Hemoglobin 14.6; Platelets 184; Potassium 4.1; Sodium 145  Recent Lipid Panel    Component Value Date/Time   CHOL 177 04/02/2020 1029   CHOL 151 05/19/2013 0213   TRIG 72 04/02/2020 1029   TRIG 58 05/19/2013 0213   HDL 63 04/02/2020 1029   HDL 49 05/19/2013 0213   CHOLHDL 2.8 04/02/2020 1029   CHOLHDL 3 11/28/2019 1538   VLDL 14.6 11/28/2019 1538   VLDL 12  05/19/2013 0213   LDLCALC 100 (H) 04/02/2020 1029   LDLCALC 90 05/19/2013 0213     Home Medications   Current Meds  Medication Sig  . clopidogrel (PLAVIX) 75 MG tablet Take 1 tablet (75 mg total) by mouth daily.  . hydrocerin (EUCERIN) CREA Apply 1 application topically 2 (two) times daily. Apply to bilateral LEs and feet twice daily after cleansing with house skin cleanser and gently patting dry. No not apply between toes.  . metoprolol succinate (TOPROL-XL) 25 MG 24 hr tablet Take 1 tablet (25 mg total) by mouth daily.  . potassium chloride SA (KLOR-CON) 20 MEQ tablet Take 1 tablet (20 mEq total) by mouth daily.  Marland Kitchen torsemide (DEMADEX) 20 MG tablet Take 2 tablets (40 mg total) by mouth 2 (two) times daily.  . [  DISCONTINUED] ferrous sulfate 325 (65 FE) MG EC tablet Take 1 tablet (325 mg total) by mouth 2 (two) times daily.     Review of Systems  All other systems reviewed and are otherwise negative except as noted above.  Physical Exam    VS:  BP (!) 140/50 (BP Location: Right Arm, Patient Position: Sitting, Cuff Size: Normal)   Pulse 100   Ht _0  (1.803 m)   Wt 294 lb (133.4 kg)   SpO2 95%   BMI 41.00 kg/m  , BMI Body mass index is 41 kg/m.  Wt Readings from Last 3 Encounters:  04/18/20 294 lb (133.4 kg)  04/02/20 (!) 303 lb (137.4 kg)  12/05/19 (!) 302 lb 12.8 oz (137.3 kg)   GEN: Well nourished, overweight, well developed, in no acute distress. HEENT: normal. Cardiac: Tachycardic, loud, harsh, late peaking systolic murmur gr 4/6, RRR, no rubs, or gallops. No clubbing, cyanosis.  VVS: Bilateral lower extremities with woody appearing edema.  Left lower extremity with no longer with weeping compared to previous clinic visit 2 weeks ago. Respiratory:  Respirations regular and unlabored.  Lung sounds clear on exam.  No adventitious lung sounds. GI: Soft, nontender, nondistended. MS: No deformity or atrophy. Skin: Warm and dry, no rash. Neuro:  Strength and sensation are  intact. Psych: Normal affect.  Assessment & Plan   1. Chronic diastolic heart failure / Severe aortic stenosis - Severe aortic stenosis by echo 03/2018. Stage D severe aortic stenosis with dyspnea, edema. If untreated progression of this disease leads to a life expectancy of typically 12 to 24 months from diagnosis.  He has met with TAVR team and declined TAVR.  He has been referred to palliative care at clinic visit 04/02/2020 and his brother was provided with the phone number to call today as he missed their call earlier this week.  He is down 9 pounds since resuming torsemide and potassium.  Continue torsemide 40 mg twice daily and potassium 20 mEq daily.  Repeat BMP today.  His lower extremity abdominal edema has improved since last seen.   2. Medication management - Removed ferrous sulfate from his medication list today has he has not taken quite some time and his recent lab work 04/02/2020 showed hemoglobin 14.6, hematocrit 42.8, RBC 5.23.    3. Morbid obesity- Weight up 27 pounds in last year likely due to sedentary lifestyle, fluid retention.  4. CAD- Reports no chest pain, pressure, tightness.  EKG with no acute ST/T wave changes. Continue Plavix, Toprol.   5. RBBB -Stable finding on EKG today.  Denies near-syncope, syncope.  Continue to monitor with periodic EKG.  6. HLD- In the setting of worsening severe aortic stenosis-difficulty with medication management, has opted to discontinue his Atorvastatin at this time  7. Lymphedema-Contributory to lower extremity edema.  Encouraged to elevate lower extremities when sitting.  Continue torsemide of present dose  8. HTN -BP reasonably controlled today.  In the setting of severe aortic stenosis will allow for some permissive hypertension.  BP goal less than 140/90.  Disposition: Follow up in 2 month(s) with Dr. Rockey Situ or APP   Signed, Loel Dubonnet, NP 04/18/2020, 12:23 PM [place]

## 2020-04-18 NOTE — Patient Instructions (Addendum)
Medication Instructions:  Continue your current medications. The Torsemide is doing a great job of getting fluid off!  We have removed Ferrous Sulfate (Iron) from your medicine list as you are not currently taking it and your blood counts are good!  *If you need a refill on your cardiac medications before your next appointment, please call your pharmacy*  Lab Work: Your provider recommends lab work today: BMP  If you have labs (blood work) drawn today and your tests are completely normal, you will receive your results only by:  MyChart Message (if you have MyChart) OR  A paper copy in the mail If you have any lab test that is abnormal or we need to change your treatment, we will call you to review the results.   Testing/Procedures: Your EKG today shows your heart rate was improved compared to previous.  Follow-Up: At [facility], you and your health needs are our priority.  As part of our continuing mission to provide you with exceptional heart care, we have created designated Provider Care Teams.  These Care Teams include your primary Cardiologist (physician) and Advanced Practice Providers (APPs -  Physician Assistants and Nurse Practitioners) who all work together to provide you with the care you need, when you need it.  We recommend signing up for the patient portal called "MyChart".  Sign up information is provided on this After Visit Summary.  MyChart is used to connect with patients for Virtual Visits (Telemedicine).  Patients are able to view lab/test results, encounter notes, upcoming appointments, etc.  Non-urgent messages can be sent to your provider as well.   To learn more about what you can do with MyChart, go to [email or web address].    Your next appointment:   2 month(s)  The format for your next appointment:   In Person  Provider:   You may see Julien Nordmann, MD or one of the following Advanced Practice Providers on your designated Care Team:    Nicolasa Ducking, NP  Eula Listen, PA-C  Marisue Ivan, PA-C  Cadence La Verkin, [place]  Gillian Shields, NP  Other Instructions  Keep your feet up when sitting.  Please call [facility] to schedule an appointment. Their phone number is [phone] and ask for Merilynn Finland. They will get you set up with an appointment!

## 2020-04-19 LAB — BASIC METABOLIC PANEL
BUN/Creatinine Ratio: 19 (ref 10–24)
BUN: 17 mg/dL (ref 8–27)
CO2: 24 mmol/L (ref 20–29)
Calcium: 9.1 mg/dL (ref 8.6–10.2)
Chloride: 106 mmol/L (ref 96–106)
Creatinine, Ser: 0.89 mg/dL (ref 0.76–1.27)
GFR calc Af Amer: 89 mL/min/{1.73_m2} (ref >59)
GFR calc non Af Amer: 77 mL/min/{1.73_m2} (ref >59)
Glucose: 102 mg/dL — ABNORMAL HIGH (ref 65–99)
Potassium: 4 mmol/L (ref 3.5–5.2)
Sodium: 142 mmol/L (ref 134–144)

## 2020-04-21 ENCOUNTER — Telehealth: Payer: Self-pay | Admitting: *Deleted

## 2020-04-21 ENCOUNTER — Telehealth: Payer: Self-pay

## 2020-04-21 NOTE — Telephone Encounter (Signed)
Attempted to call pt with lab results. No answer. LMOM TCB.  

## 2020-04-21 NOTE — Telephone Encounter (Signed)
Spoke with patient's brother [name]. Joe states patient has moved. He will call me back with the updated address. I will then schedule a Palliative consult.

## 2020-04-21 NOTE — Telephone Encounter (Signed)
-----   Message from Alver Sorrow, NP sent at 04/21/2020  7:54 AM EST ----- Normal kidney function and electrolytes. Continue current medications.   Please remind him to have his brother call [facility] [phone] to schedule appointment. This was discussed in clinic 04/18/20

## 2020-04-22 NOTE — Telephone Encounter (Signed)
Attempted to call the patient/ his brother at the primary phone number. No answer- I left a message to please call back.

## 2020-04-22 NOTE — Telephone Encounter (Signed)
Spoke with patient's brother and he verbalized understanding of the lab results. In looking at patient's chart, Palliative care consult has been initiated.

## 2020-04-24 ENCOUNTER — Telehealth: Payer: Self-pay

## 2020-04-24 NOTE — Telephone Encounter (Signed)
Spoke with patient's brother [name]. He states patient's grand daughter does not want patient to receive Palliative service, but [name] thinks patient needs the service. Joe requested a call back on 12/13 he wants to discuss with patient's family once again.

## 2020-04-30 ENCOUNTER — Telehealth: Payer: Self-pay

## 2020-04-30 NOTE — Telephone Encounter (Signed)
Left message for patient's brother [name] to call back to let us know if patient's family would like to proceed with palliative referral.

## 2020-05-02 ENCOUNTER — Telehealth: Payer: Self-pay

## 2020-05-02 NOTE — Telephone Encounter (Signed)
Spoke with patient's brother [name]. Patient and grand daughter decided to decline Palliative services at this time. Will cancel referral and notify MD

## 2020-05-02 NOTE — Telephone Encounter (Signed)
Noted  

## 2020-06-18 ENCOUNTER — Ambulatory Visit: Payer: Medicare Other | Admitting: Physician Assistant

## 2020-06-20 ENCOUNTER — Encounter: Payer: Self-pay | Admitting: Physician Assistant

## 2020-06-20 ENCOUNTER — Ambulatory Visit (INDEPENDENT_AMBULATORY_CARE_PROVIDER_SITE_OTHER): Payer: Medicare Other | Admitting: Physician Assistant

## 2020-06-20 ENCOUNTER — Other Ambulatory Visit: Payer: Self-pay

## 2020-06-20 ENCOUNTER — Ambulatory Visit: Payer: Medicare Other | Admitting: Cardiovascular Disease

## 2020-06-20 VITALS — BP 140/44 | HR 98 | Ht 71.0 in | Wt 284.4 lb

## 2020-06-20 DIAGNOSIS — Z79899 Other long term (current) drug therapy: Secondary | ICD-10-CM | POA: Diagnosis not present

## 2020-06-20 DIAGNOSIS — I350 Nonrheumatic aortic (valve) stenosis: Secondary | ICD-10-CM | POA: Diagnosis not present

## 2020-06-20 DIAGNOSIS — L03119 Cellulitis of unspecified part of limb: Secondary | ICD-10-CM | POA: Diagnosis not present

## 2020-06-20 DIAGNOSIS — I890 Lymphedema, not elsewhere classified: Secondary | ICD-10-CM

## 2020-06-20 DIAGNOSIS — I10 Essential (primary) hypertension: Secondary | ICD-10-CM | POA: Diagnosis not present

## 2020-06-20 DIAGNOSIS — I5033 Acute on chronic diastolic (congestive) heart failure: Secondary | ICD-10-CM

## 2020-06-20 DIAGNOSIS — I2510 Atherosclerotic heart disease of native coronary artery without angina pectoris: Secondary | ICD-10-CM

## 2020-06-20 DIAGNOSIS — E1159 Type 2 diabetes mellitus with other circulatory complications: Secondary | ICD-10-CM | POA: Diagnosis not present

## 2020-06-20 DIAGNOSIS — E785 Hyperlipidemia, unspecified: Secondary | ICD-10-CM

## 2020-06-20 DIAGNOSIS — I5032 Chronic diastolic (congestive) heart failure: Secondary | ICD-10-CM | POA: Diagnosis not present

## 2020-06-20 DIAGNOSIS — I872 Venous insufficiency (chronic) (peripheral): Secondary | ICD-10-CM

## 2020-06-20 MED ORDER — CEPHALEXIN 500 MG PO CAPS: 500.0000 mg | ORAL_CAPSULE | Freq: Four times a day (QID) | ORAL | 0 refills | Status: AC

## 2020-06-20 MED ORDER — TORSEMIDE 20 MG PO TABS: 60.0000 mg | ORAL_TABLET | Freq: Two times a day (BID) | ORAL | 1 refills | Status: DC

## 2020-06-20 MED ORDER — POTASSIUM CHLORIDE ER 10 MEQ PO TBCR: 30.0000 meq | EXTENDED_RELEASE_TABLET | Freq: Every day | ORAL | 1 refills | Status: DC

## 2020-06-20 NOTE — Progress Notes (Signed)
Office Visit    Patient Name: [name] Date of Encounter: 06/20/2020  Primary Care Provider:  Leone Haven, MD Primary Cardiologist:  Ida Rogue, MD  Chief Complaint    Chief Complaint  Patient presents with  . 2 month follow up    Patient c/o LE edema. Medications reviewed by the patient verbally.     87 year old male with history of chronic diastolic heart failure, severe aortic stenosis, moderate MR, CAD, hypertension, hyperlipidemia, prior tobacco use, lymphedema, GERD, and here today for 51-monthfollow-up.  Past Medical History    Past Medical History:  Diagnosis Date  . (HFpEF) heart [place] with [place] ejection fraction ([place]    a. 03/2018 Echo: EF 60-65%, Gr1 DD.  .[place] KitchenAortic atherosclerosis ([place] 05[place]   Per TEE  . Arrhythmia   . Asthma   . CHF (congestive heart failure) ([place]   . Colon polyp   . Coronary artery disease    a. Nonobstructive by 2004 cath b. 05/2013 NSTEMI/Cath: >M 30, LAD 366mLCX 2039mCA 30p, EF 60%, ? Sev AS (mod by TEE).  . GMarland KitchenRD (gastroesophageal reflux disease)   . Heart murmur   . Hiatal hernia   . Hyperlipidemia   . Hypertension   . Lymphedema   . Mitral regurgitation    a. 03/2018 Echo: Mild to mod MR.  . Obesity   . Onychomycosis   . Osteoarthritis   . Recurrent cellulitis of lower extremity   . Severe aortic stenosis    a. 2013 Echo: EF 55%, mod AS; b. TEE 05/2013: EF EF 60-65%, mod AS; c.10/2014 Echo: 55-60%, mod AS; d. 10/2016 Echo: EF 65-70%, Mod-Sev AS; e. 03/2018 Ehco: EF 60-65%, Sev AS, mild to mod AI. Mean grad (S) 44m24m Valve Area (VTI): 0.92cm^2.   Past Surgical History:  Procedure Laterality Date  . CARDIAC CATHETERIZATION  11/2002  . CARDIAC CATHETERIZATION  05/21/2013   showing 30% oLM, 30% D1, 20% mLCx, 30% pRCA; EF 60%, severe AS (mean gradient 28 mmHg, peak 26, area 0.9)  . CHOLECYSTECTOMY  2001  . TRANSESOPHAGEAL ECHOCARDIOGRAM  05/22/2013   Preserved EF, moderate AS (valve area by planimetry between  1.0-1.26 cm sq), moderate aortic arch and descending aortic atherosclerosis.   . TRANSTHORACIC ECHOCARDIOGRAM  05/19/2013   EF 60-65%, impaired diastolic function, mild LA dilatation, mild MR/AI/TR, mod AS, high normal RVSP (30.4 mmHg)    Allergies  Allergies  Allergen Reactions  . Aspirin Rash and Other (See Comments)    Reaction: Trouble breathing     History of Present Illness    [name] is a 89 y56. male with PMH as above.  He has a history of chronic diastolic heart failure and severe aortic stenosis, as well as CAD, hypertension, hyperlipidemia, and moderate MR.  He was admitted 02/2018 with volume overload and successfully IV diuresed.    Echo 03/2018 showed EF 65%, G1 DD, severe aortic stenosis and mild to moderate MR.  He declined TAVR work-up at that time.    Seen by Dr. McAlAngelena Form2020 with some dyspnea but no change over the last year.  Noted chronic lower extremity edema due to lymphedema.  He was to defer TAVR work-up at that time.   11/2019, he was treated for cellulitis by his PCP with Keflex and doxycycline.  03/2020, seen in cardiology follow-up.  He presented with his brother.  He was not taking his medications consistently.  He noted lower extremity edema and abdominal distention.  His left lower  extremity was weeping.  When seen at follow-up 04/2020, his weight was down 9 pounds from his previous clinic visit and he noted improvement in lower extremRight ity edema and abdominal bloating.  He was very sedentary, spending much of the [place] watching television.  His primary care physician referred him to palliative care, which she has since declined.  Today, 06/20/2020, he was seen in clinic and still noted to be volume up with report of ongoing lower extremity edema and dyspnea.  Weight today 284 pounds and 6 ounces, which is decreased 10 pounds from his previous clinic visit at 294 pounds.  Left extremity erythema noted on exam with patient unaware of this change in  denying any weeping.  He continues to be very sedentary.  No chest pain, racing heart rate, palpitations, presyncope, or syncope.  No reported orthopnea, PND, or early satiety.  No reported signs or symptoms of bleeding.  Reports medication compliance.  Home Medications    Current Outpatient Medications on File Prior to Visit  Medication Sig Dispense Refill  . clopidogrel (PLAVIX) 75 MG tablet Take 1 tablet (75 mg total) by mouth daily. 90 tablet 3  . hydrocerin (EUCERIN) CREA Apply 1 application topically 2 (two) times daily. Apply to bilateral LEs and feet twice daily after cleansing with house skin cleanser and gently patting dry. No not apply between toes.  0  . metoprolol succinate (TOPROL-XL) 25 MG 24 hr tablet Take 1 tablet (25 mg total) by mouth daily. 90 tablet 3  . potassium chloride SA (KLOR-CON) 20 MEQ tablet Take 1 tablet (20 mEq total) by mouth daily. 90 tablet 3  . torsemide (DEMADEX) 20 MG tablet Take 2 tablets (40 mg total) by mouth 2 (two) times daily. 360 tablet 1   No current facility-administered medications on file prior to visit.    Review of Systems    He denies chest pain, palpitations, pnd, orthopnea, n, v, dizziness, syncope, weight gain, or early satiety.  He reports ongoing lower extremity edema and dyspnea.  All other systems reviewed and are otherwise negative except as noted above.  Physical Exam    VS:  BP (!) 140/44 (BP Location: Right Arm, Patient Position: Sitting, Cuff Size: Normal)   Pulse 98   Ht 5' 11"  (1.803 m)   Wt 284 lb 6 oz (129 kg)   SpO2 95%   BMI 39.66 kg/m  , BMI Body mass index is 39.66 kg/m. GEN: Obese, in no acute distress.  Joined by his brother HEENT: normal. Neck: Supple, no JVD, carotid bruits, or masses. Cardiac: RRR, no rubs, or gallops.  Harsh 3/6 systolic murmur best appreciated in RUSB.  1/6 systolic murmur along LLSB.  No clubbing, cyanosis.  Woody 2-3+ edema.  Left lower extremity erythema and warmth.  Radials/DP/PT 2+ and  equal bilaterally.  Respiratory: Poor inspiratory effort, regular and unlabored, distant breath sounds, reduced bibasilar breath sounds. GI: Slightly distended, not TTP, BS + x 4. MS: no deformity or atrophy. Skin: warm and dry,LLE erythema and warmth Neuro:  Strength and sensation are intact. Psych: Normal affect.  Accessory Clinical Findings    ECG personally reviewed by me today - NSR, RBBB (known), QRS 156, PR interval 194 ms, 98 bpm, inferior infarct seen in previous EKGs, nonspecific changes versus artifact,- no acute changes.  VITALS Reviewed today   Temp Readings from Last 3 Encounters:  12/05/19 99.2 F (37.3 C) (Oral)  12/03/19 99 F (37.2 C) (Oral)  11/28/19 98.9 F (37.2 C) (Oral)  BP Readings from Last 3 Encounters:  06/20/20 (!) 140/44  04/18/20 (!) 140/50  04/02/20 132/60   Pulse Readings from Last 3 Encounters:  06/20/20 98  04/18/20 100  04/02/20 (!) 112    Wt Readings from Last 3 Encounters:  06/20/20 284 lb 6 oz (129 kg)  04/18/20 294 lb (133.4 kg)  04/02/20 (!) 303 lb (137.4 kg)     LABS  reviewed today   Lab Results  Component Value Date   WBC 5.5 04/02/2020   HGB 14.6 04/02/2020   HCT 42.8 04/02/2020   MCV 82 04/02/2020   PLT 184 04/02/2020   Lab Results  Component Value Date   CREATININE 0.89 04/18/2020   BUN 17 04/18/2020   NA 142 04/18/2020   K 4.0 04/18/2020   CL 106 04/18/2020   CO2 24 04/18/2020   Lab Results  Component Value Date   ALT 10 04/02/2020   AST 18 04/02/2020   ALKPHOS 87 04/02/2020   BILITOT 1.5 (H) 04/02/2020   Lab Results  Component Value Date   CHOL 177 04/02/2020   HDL 63 04/02/2020   LDLCALC 100 (H) 04/02/2020   TRIG 72 04/02/2020   CHOLHDL 2.8 04/02/2020    Lab Results  Component Value Date   HGBA1C 5.2 11/28/2019   Lab Results  Component Value Date   TSH 1.45 03/02/2017     STUDIES/PROCEDURES reviewed today   Echo 03/18/18 - Left ventricle: The cavity size was normal. There was moderate   concentric hypertrophy. Systolic function was normal. The  estimated ejection fraction was in the range of 60% to 65%. Wall  motion was normal; there were no regional wall motion  abnormalities. Doppler parameters are consistent with abnormal  left ventricular relaxation (grade 1 diastolic dysfunction).  - Aortic valve: Trileaflet; severely calcified leaflets.  Transvalvular velocity was increased. There was severe stenosis.  There was mild to moderate regurgitation. Mean gradient (S): 50  mm Hg. Valve area (VTI): 0.92 cm^2.  - Mitral valve: There was mild to moderate regurgitation.  - Right ventricle: Systolic function was normal.  - Pulmonary arteries: PA peak pressure: 43 mm Hg (S).   Assessment & Plan    [facility] Diastolic heart failure --Ongoing DOE and edema, likely 2/2 AOC HFpEF and severe aortic stenosis.  Physical exam somewhat limited by body habitus, as well as known lymphedema, though suspect at least still mildly volume up despite the fact that weight is down 10 pounds from previous clinic visit 04/2020.  BP elevated with SBP 140.  Increase to torsemide 92m BID with KCl tab 387m daily with adjustments made pending BMET and BNP today. Given his obesity, it is expected that BNP will be falsely low on labs, which will be taken into consideration given his exam is consistent with at least mild overload today. Repeat BMET 1 to 2 weeks.  Reviewed salt and fluid intake.  Recommend ongoing leg elevation and compression/lymphedema pumps for lymphedema.  At RTC, consider increasing Toprol if still room in heart rate and blood pressure following increased diuresis.  Severe Aortic Stenosis --Given severe aortic stenosis, dyspnea and elevated volume status remain a chronic finding.  He is stage D severe aortic stenosis and previously informed that untreated severe aortic stenosis leads to life expectancy of 12-24 months from diagnosis. He met with the TAVR team and declined TAVR.   Per review of EMR, his brother has declined palliative care on his behalf. Continue to manage volume status with torsemide and corresponding potassium  supplementation, increased on today's visit with repeat BMET.  At RTC, increased Toprol if still room in heart rate and blood pressure following increased diuresis and to reduce stress on his valve.  RLE Cellulitis --LLE today shows erythema and warmth appreciated with known recurrent cellulitis. He is unsure of when the erythema began.  On review of previous physical exam notes, erythema appears new from previous visit.  Start Keflex 538m TID x 7 days today to treat cellulitis, as it appears he may again be fighting this infection. No reported recent fevers, which is reassuring.   CAD --No CP or concerning sx for angina. He is relatively sedentary but denies CP with exertion. DOE reported in the setting of both volume overload and likely deconditioning.  EKG without acute ST/T changes. Continue current Plavix and Toprol. He has discontinued his statin, which is recommended for risk factor modification. Recommend weight loss and increase in activity, as well as heart healthy diet. At RTC, consider increase of Toprol to 50 mg daily if still room in heart rate and blood pressure.  HLD, LDL goal < 70 --LDL 100 and not at goal of below 70. Self discontinued his statin.  Recommend restart for risk factor modification. Reassess at RTC.  HTN, goal BP 140/90 or lower --BP elevated and likely at least in part to volume status, which despite a 10 lb wt loss, remains still at least mildly up from baseline. BP goal of less than 140/90. Increase torsemide to 647mBID with KCl tab 3039mdaily as above. Check BMET, BNP. Recommend fluid and salt restriction. Monitor at home, along with wt. If BP still elevated at RTC, recommend increasing BB to Toprol to 50 mg daily to mitigate stress on his aortic valve.  Morbid obesity --Wt loss advised. Reviewed lifestyle  modifications.   RBBB --Stable on EKG. No presyncope or syncope.   Medication changes: Keflex 500m14mD x7 days. Increase to torsemide 60mg47m with KCL tab increased to 30mEq66mly.  Labs ordered: BMET, BNP, CBC Studies / Imaging ordered: None Future considerations: At RTC, increase Toprol to 50 mg if still room in HR/BP Disposition: RTC 3 months     JacqueArvil Chaco 06/20/2020

## 2020-06-20 NOTE — Patient Instructions (Signed)
Medication Instructions:  Your physician has recommended you make the following change in your medication:   1) START Cephalexin (Kelfex) 500mg  - Take 1 tablet FOUR TIMES A [place] for 7 days  2) INCREASE Torsemide to 60mg  TWICE DAILY - A new Rx has been sent to your pharmacy  3) INCREASE Potassium to 30 mEq DAILY - Please take NEW PRESCRIPTION that was sent to your pharmacy and DO NOT use prior prescription that was for 20 mEq. New Rx is for 30 mEq  *If you need a refill on your cardiac medications before your next appointment, please call your pharmacy*   Lab Work:  1)  Your physician recommends that you have lab work TODAY in our office: Bmet, BNP, CBC  2)  Your physician recommends that you RETURN FOR LAB WORK IN TWO WEEKS to the Medical Mall at [facility]: Bmet -  Please go to the [facility]. You will check in at the front desk to the right as you walk into the atrium. Valet Parking is offered if needed. - No appointment needed. You may go any [place] between 7 am and 6 pm.  If you have labs (blood work) drawn today and your tests are completely normal, you will receive your results only by: [facility] MyChart Message (if you have MyChart) OR . A paper copy in the mail If you have any lab test that is abnormal or we need to change your treatment, we will call you to review the results.   Testing/Procedures: None ordered   Follow-Up: At [facility], you and your health needs are our priority.  As part of our continuing mission to provide you with exceptional heart care, we have created designated Provider Care Teams.  These Care Teams include your primary Cardiologist (physician) and Advanced Practice Providers (APPs -  Physician Assistants and Nurse Practitioners) who all work together to provide you with the care you need, when you need it.  We recommend signing up for the patient portal called "MyChart".  Sign up information is provided on this After Visit Summary.  MyChart is used to  connect with patients for Virtual Visits (Telemedicine).  Patients are able to view lab/test results, encounter notes, upcoming appointments, etc.  Non-urgent messages can be sent to your provider as well.   To learn more about what you can do with MyChart, go to Marland Kitchen.    Your next appointment:   3 month(s)  The format for your next appointment:   In Person  Provider:   You may see [facility], MD or one of the following Advanced Practice Providers on your designated Care Team:    [email or web address], NP  Julien Nordmann, PA-C  Nicolasa Ducking, PA-C  Cadence Cornelia, Marisue Ivan  [place], NP

## 2020-06-21 LAB — CBC
Hematocrit: 38.8 % (ref 37.5–51.0)
Hemoglobin: 13.6 g/dL (ref 13.0–17.7)
MCH: 27.9 pg (ref 26.6–33.0)
MCHC: 35.1 g/dL (ref 31.5–35.7)
MCV: 80 fL (ref 79–97)
Platelets: 186 10*3/uL (ref 150–450)
RBC: 4.88 x10E6/uL (ref 4.14–5.80)
RDW: 12 % (ref 11.6–15.4)
WBC: 4.7 10*3/uL (ref 3.4–10.8)

## 2020-06-21 LAB — BASIC METABOLIC PANEL
BUN/Creatinine Ratio: 11 (ref 10–24)
BUN: 11 mg/dL (ref 8–27)
CO2: 26 mmol/L (ref 20–29)
Calcium: 8.3 mg/dL — ABNORMAL LOW (ref 8.6–10.2)
Chloride: 104 mmol/L (ref 96–106)
Creatinine, Ser: 0.96 mg/dL (ref 0.76–1.27)
GFR calc Af Amer: 82 mL/min/{1.73_m2} (ref >59)
GFR calc non Af Amer: 71 mL/min/{1.73_m2} (ref >59)
Glucose: 128 mg/dL — ABNORMAL HIGH (ref 65–99)
Potassium: 3.7 mmol/L (ref 3.5–5.2)
Sodium: 143 mmol/L (ref 134–144)

## 2020-06-21 LAB — BRAIN NATRIURETIC PEPTIDE: BNP: 96.9 pg/mL (ref 0.0–100.0)

## 2020-09-15 NOTE — Progress Notes (Signed)
Cardiology Office Note  Date:  09/17/2020   ID:  [name], DOB February 17, 1931, MRN [id number]  PCP:  Glori Luis, MD   Chief Complaint  Patient presents with  . 3 month follow up    Patient c/o shortness of breath and some LE edema and chest pain. Medications reviewed by the patient verbally.     HPI:  Mr. Seckman is a very pleasant 87 year old gentleman with  aortic valve stenosis, 12/2016 obesity,  diabetes,  smoking history for 30 years,   mild coronary artery disease by cardiac catheterization in 2004,  normal ejection fraction with normal wall motion,  minimal carotid arterial disease  Chronic leg swelling from lymphedema Does not use his lymphedema pumps on a regular basis Does not wear compression hose who presents for routine follow-up of his aortic valve stenosis, follow-up after recent hospitalization  Presents with family today, brother Prior office visit declined referral to [facility] for discussion of TAVR In follow-up today reports he does not want work-up for his aortic valve  Brother reports Mr. Dasaro sits at home, legs down, no activity, does not do any exercise, watches TV  Struggling with chronic lower extremity edema, does not like to put on compression hose, or lymphedema pumps  Compliant with his medications he reports Slow trend down in his weight over the past 6 months down 13 pounds Takes torsemide 40 BID  Does not go out much, daughter-in-law does the shopping Denies near syncope, syncope, chest pain  Chronic knee pain, previously with cortisone shots  EKG personally reviewed by myself on todays visit Shows normal sinus rhythm with rate 79 bpm right bundle branch block, PVCs  Other past medical history reviewed  hospitalization [facility] March 16 2018 to November 4   lower extremity cellulitis and HFpEF.    treated with antibiotics and IV diuretics   weight from 273 pounds on admission to 264 pounds.   echocardiogram  showed an EF of 60 to 65%  with severe aortic stenosis.  mean gradient was 50 mmHg with a valve area of 0.92 cm.   hospital March 2019 for bilateral lower extremity cellulitis Acute on chronic diastolic CHF Treated with antibiotics and Lasix  seen in the ER 12/2015: knee pain, Set up appt ortho wife is in a nursing home, had a bilateral stroke.  Lower extremity venous Doppler reviewed with him from 05/08/2015 showing no DVT on the right  Cardiac catheterization July 2004 showed 20% disease in the LAD, RCA and left circumflex Stress test in 2012 shows no significant ischemia, attenuation artifact in the septal wall  PMH:   has a past medical history of (HFpEF) heart failure with preserved ejection fraction (HCC), Aortic atherosclerosis (HCC) (05/2013), Arrhythmia, Asthma, CHF (congestive heart failure) (HCC), Colon polyp, Coronary artery disease, GERD (gastroesophageal reflux disease), Heart murmur, Hiatal hernia, Hyperlipidemia, Hypertension, Lymphedema, Mitral regurgitation, Obesity, Onychomycosis, Osteoarthritis, Recurrent cellulitis of lower extremity, and Severe aortic stenosis.  PSH:    Past Surgical History:  Procedure Laterality Date  . CARDIAC CATHETERIZATION  11/2002  . CARDIAC CATHETERIZATION  05/21/2013   showing 30% oLM, 30% D1, 20% mLCx, 30% pRCA; EF 60%, severe AS (mean gradient 28 mmHg, peak 26, area 0.9)  . CHOLECYSTECTOMY  2001  . TRANSESOPHAGEAL ECHOCARDIOGRAM  05/22/2013   Preserved EF, moderate AS (valve area by planimetry between 1.0-1.26 cm sq), moderate aortic arch and descending aortic atherosclerosis.   . TRANSTHORACIC ECHOCARDIOGRAM  05/19/2013   EF 60-65%, impaired diastolic function, mild LA dilatation, mild MR/AI/TR,  mod AS, high normal RVSP (30.4 mmHg)    Current Outpatient Medications  Medication Sig Dispense Refill  . clopidogrel (PLAVIX) 75 MG tablet Take 1 tablet (75 mg total) by mouth daily. 90 tablet 3  . hydrocerin (EUCERIN) CREA Apply 1 application topically 2 (two) times  daily. Apply to bilateral LEs and feet twice daily after cleansing with house skin cleanser and gently patting dry. No not apply between toes.  0  . metoprolol succinate (TOPROL-XL) 25 MG 24 hr tablet Take 1 tablet (25 mg total) by mouth daily. 90 tablet 3  . mometasone (ELOCON) 0.1 % cream APPLY SMALL AMOUNT TOPICALLY AS NEEDED    . potassium chloride (KLOR-CON) 10 MEQ tablet Take 20 mEq by mouth daily.    Marland Kitchen torsemide (DEMADEX) 20 MG tablet Take 40 mg by mouth daily.     No current facility-administered medications for this visit.    Allergies:   Aspirin   Social History:  The patient  reports that he quit smoking about 39 years ago. His smoking use included cigarettes. He has a 30.00 pack-year smoking history. He has never used smokeless tobacco. He reports that he does not drink alcohol and does not use drugs.   Family History:   family history includes Heart disease in his mother; Heart disease (age of onset: 19) in his father; Stroke in his brother.    Review of Systems: Review of Systems  Constitutional: Negative.   Respiratory: Negative.   Cardiovascular: Positive for leg swelling.  Gastrointestinal: Negative.   Musculoskeletal: Positive for joint pain.  Neurological: Negative.   Psychiatric/Behavioral: Negative.   All other systems reviewed and are negative.   PHYSICAL EXAM: VS:  BP (!) 118/40 (BP Location: Left Arm, Patient Position: Sitting)   Pulse 79   Ht 5\' 11"  (1.803 m)   Wt 281 lb 8 oz (127.7 kg)   SpO2 96%   BMI 39.26 kg/m  , BMI Body mass index is 39.26 kg/m. Constitutional:  oriented to person, place, and time. No distress.  HENT:  Head: Grossly normal Eyes:  no discharge. No scleral icterus.  Neck: No JVD, no carotid bruits  Cardiovascular: Regular rate and rhythm, no murmurs appreciated Lymph swelling lower extremities Pulmonary/Chest: Clear to auscultation bilaterally, no wheezes or rails Abdominal: Soft.  no distension.  no tenderness.   Musculoskeletal: Normal range of motion Neurological:  normal muscle tone. Coordination normal. No atrophy Skin: Skin warm and dry Psychiatric: normal affect, pleasant  Recent Labs: 04/02/2020: ALT 10 06/20/2020: BNP 96.9; BUN 11; Creatinine, Ser 0.96; Hemoglobin 13.6; Platelets 186; Potassium 3.7; Sodium 143    Lipid Panel Lab Results  Component Value Date   CHOL 177 04/02/2020   HDL 63 04/02/2020   LDLCALC 100 (H) 04/02/2020   TRIG 72 04/02/2020      Wt Readings from Last 3 Encounters:  09/17/20 281 lb 8 oz (127.7 kg)  06/20/20 284 lb 6 oz (129 kg)  04/18/20 294 lb (133.4 kg)     ASSESSMENT AND PLAN:  SOB (shortness of breath) - Deconditioned, morbid obesity,aortic valve stenosis and chronic diastolic CHF Relatively immobile at home, family does his shopping and activities for him Previously declined work-up for his aortic valve stenosis, again confirmed with him today  Aortic valve stenosis, etiology of cardiac valve disease unspecified -  Severe stenosis by echocardiogram We have previously placed referral to TAVR clinic, he has declined Son present for discussions today, he is declining work-up Son frustrated by his inactivity at home  Lymphedema Not wearing his compression pumps Recommended leg elevation, wraps, TED hose at least but he does not like to put these on.  Higher risk of cellulitis discussed with him  Morbid obesity (HCC) No desire to move or exercise Recommended low carbohydrate diet, his weight is trending downward slowly  Type 2 diabetes mellitus with other circulatory complication, unspecified long term insulin use status (HCC) Managed by primary care  Coronary artery disease, non-occlusive No ischemic work-up needed at this time, denies anginal symptoms Previously declined TAVR work-up  Mixed hyperlipidemia Continue current regiment Numbers at goal   Total encounter time more than 25 minutes  Greater than 50% was spent in counseling  and coordination of care with the patient    No orders of the defined types were placed in this encounter.    Signed, Dossie Arbour, M.D., Ph.D. 09/17/2020  [facility] [place], [place] [phone]

## 2020-09-17 ENCOUNTER — Encounter: Payer: Self-pay | Admitting: Cardiovascular Disease

## 2020-09-17 ENCOUNTER — Other Ambulatory Visit: Payer: Self-pay

## 2020-09-17 ENCOUNTER — Ambulatory Visit (INDEPENDENT_AMBULATORY_CARE_PROVIDER_SITE_OTHER): Payer: Medicare Other | Admitting: Cardiovascular Disease

## 2020-09-17 DIAGNOSIS — E785 Hyperlipidemia, unspecified: Secondary | ICD-10-CM

## 2020-09-17 DIAGNOSIS — E1159 Type 2 diabetes mellitus with other circulatory complications: Secondary | ICD-10-CM

## 2020-09-17 DIAGNOSIS — I5032 Chronic diastolic (congestive) heart failure: Secondary | ICD-10-CM

## 2020-09-17 DIAGNOSIS — I2510 Atherosclerotic heart disease of native coronary artery without angina pectoris: Secondary | ICD-10-CM | POA: Diagnosis not present

## 2020-09-17 DIAGNOSIS — I5033 Acute on chronic diastolic (congestive) heart failure: Secondary | ICD-10-CM | POA: Diagnosis not present

## 2020-09-17 DIAGNOSIS — I890 Lymphedema, not elsewhere classified: Secondary | ICD-10-CM | POA: Diagnosis not present

## 2020-09-17 DIAGNOSIS — I350 Nonrheumatic aortic (valve) stenosis: Secondary | ICD-10-CM

## 2020-09-17 NOTE — Patient Instructions (Signed)
Medication Instructions:  No changes  If you need a refill on your cardiac medications before your next appointment, please call your pharmacy.    Lab work: No new labs needed   If you have labs (blood work) drawn today and your tests are completely normal, you will receive your results only by: . MyChart Message (if you have MyChart) OR . A paper copy in the mail If you have any lab test that is abnormal or we need to change your treatment, we will call you to review the results.   Testing/Procedures: No new testing needed   Follow-Up: At [facility], you and your health needs are our priority.  As part of our continuing mission to provide you with exceptional heart care, we have created designated Provider Care Teams.  These Care Teams include your primary Cardiologist (physician) and Advanced Practice Providers (APPs -  Physician Assistants and Nurse Practitioners) who all work together to provide you with the care you need, when you need it.  . You will need a follow up appointment in 12 months  . Providers on your designated Care Team:   . Christopher Berge, NP . Ryan Dunn, PA-C . Jacquelyn Visser, PA-C  Any Other Special Instructions Will Be Listed Below (If Applicable).  COVID-19 Vaccine Information can be found at: [email or web address] For questions related to vaccine distribution or appointments, please email [email or web address] or call [phone].     

## 2020-09-23 NOTE — Addendum Note (Signed)
Addended by: Thayer Headings, Janaisa Birkland L on: 09/23/2020 08:48 AM   Modules accepted: Orders

## 2020-11-10 ENCOUNTER — Ambulatory Visit: Payer: Medicare Other

## 2021-01-29 ENCOUNTER — Telehealth: Payer: Self-pay

## 2021-01-29 ENCOUNTER — Ambulatory Visit: Payer: Medicare Other

## 2021-01-29 NOTE — Telephone Encounter (Signed)
Unable to reach patient for scheduled AWV. No voicemail available. Reschedule as appropriate.

## 2021-04-14 ENCOUNTER — Other Ambulatory Visit: Payer: Self-pay | Admitting: Family

## 2021-04-14 DIAGNOSIS — I350 Nonrheumatic aortic (valve) stenosis: Secondary | ICD-10-CM

## 2021-04-14 DIAGNOSIS — I5032 Chronic diastolic (congestive) heart failure: Secondary | ICD-10-CM

## 2021-05-14 ENCOUNTER — Other Ambulatory Visit: Payer: Self-pay | Admitting: Family

## 2021-06-03 ENCOUNTER — Other Ambulatory Visit: Payer: Self-pay | Admitting: Family

## 2021-06-03 DIAGNOSIS — I5032 Chronic diastolic (congestive) heart failure: Secondary | ICD-10-CM

## 2021-06-03 DIAGNOSIS — I25119 Atherosclerotic heart disease of native coronary artery with unspecified angina pectoris: Secondary | ICD-10-CM

## 2021-06-04 ENCOUNTER — Other Ambulatory Visit: Payer: Self-pay | Admitting: Family

## 2021-06-04 DIAGNOSIS — I5032 Chronic diastolic (congestive) heart failure: Secondary | ICD-10-CM

## 2021-10-02 ENCOUNTER — Telehealth: Payer: Self-pay | Admitting: Cardiovascular Disease

## 2021-10-02 NOTE — Telephone Encounter (Signed)
3 attempts to schedule fu appt from recall list.   Deleting recall.   

## 2022-02-24 ENCOUNTER — Emergency Department: Payer: Medicare Other

## 2022-02-24 ENCOUNTER — Observation Stay: Payer: Medicare Other

## 2022-02-24 ENCOUNTER — Inpatient Hospital Stay
Admission: EM | Admit: 2022-02-24 | Discharge: 2022-03-02 | DRG: 064 | Disposition: A | Discharge status: Home Health | Payer: Medicare Other | Attending: Internal Medicine | Admitting: Internal Medicine

## 2022-02-24 ENCOUNTER — Other Ambulatory Visit: Payer: Self-pay

## 2022-02-24 DIAGNOSIS — I110 Hypertensive heart disease with heart failure: Secondary | ICD-10-CM | POA: Diagnosis present

## 2022-02-24 DIAGNOSIS — N179 Acute kidney failure, unspecified: Secondary | ICD-10-CM | POA: Diagnosis present

## 2022-02-24 DIAGNOSIS — G9341 Metabolic encephalopathy: Secondary | ICD-10-CM | POA: Diagnosis present

## 2022-02-24 DIAGNOSIS — R001 Bradycardia, unspecified: Secondary | ICD-10-CM | POA: Diagnosis present

## 2022-02-24 DIAGNOSIS — I4510 Unspecified right bundle-branch block: Secondary | ICD-10-CM | POA: Diagnosis present

## 2022-02-24 DIAGNOSIS — R41841 Cognitive communication deficit: Secondary | ICD-10-CM | POA: Diagnosis not present

## 2022-02-24 DIAGNOSIS — E860 Dehydration: Secondary | ICD-10-CM | POA: Diagnosis present

## 2022-02-24 DIAGNOSIS — R279 Unspecified lack of coordination: Secondary | ICD-10-CM | POA: Diagnosis not present

## 2022-02-24 DIAGNOSIS — Z86010 Personal history of colonic polyps: Secondary | ICD-10-CM | POA: Diagnosis not present

## 2022-02-24 DIAGNOSIS — R58 Hemorrhage, not elsewhere classified: Secondary | ICD-10-CM | POA: Diagnosis present

## 2022-02-24 DIAGNOSIS — I2510 Atherosclerotic heart disease of native coronary artery without angina pectoris: Secondary | ICD-10-CM | POA: Diagnosis present

## 2022-02-24 DIAGNOSIS — M7989 Other specified soft tissue disorders: Secondary | ICD-10-CM | POA: Diagnosis not present

## 2022-02-24 DIAGNOSIS — R410 Disorientation, unspecified: Secondary | ICD-10-CM

## 2022-02-24 DIAGNOSIS — I959 Hypotension, unspecified: Secondary | ICD-10-CM | POA: Diagnosis not present

## 2022-02-24 DIAGNOSIS — Z87891 Personal history of nicotine dependence: Secondary | ICD-10-CM

## 2022-02-24 DIAGNOSIS — J45998 Other asthma: Secondary | ICD-10-CM | POA: Diagnosis not present

## 2022-02-24 DIAGNOSIS — I6381 Other cerebral infarction due to occlusion or stenosis of small artery: Secondary | ICD-10-CM | POA: Diagnosis present

## 2022-02-24 DIAGNOSIS — R471 Dysarthria and anarthria: Secondary | ICD-10-CM | POA: Diagnosis present

## 2022-02-24 DIAGNOSIS — R600 Localized edema: Secondary | ICD-10-CM | POA: Diagnosis not present

## 2022-02-24 DIAGNOSIS — I5022 Chronic systolic (congestive) heart failure: Secondary | ICD-10-CM | POA: Diagnosis not present

## 2022-02-24 DIAGNOSIS — W19XXXA Unspecified fall, initial encounter: Secondary | ICD-10-CM | POA: Diagnosis present

## 2022-02-24 DIAGNOSIS — Y92009 Unspecified place in unspecified non-institutional (private) residence as the place of occurrence of the external cause: Secondary | ICD-10-CM

## 2022-02-24 DIAGNOSIS — N281 Cyst of kidney, acquired: Secondary | ICD-10-CM | POA: Diagnosis not present

## 2022-02-24 DIAGNOSIS — I10 Essential (primary) hypertension: Secondary | ICD-10-CM | POA: Diagnosis not present

## 2022-02-24 DIAGNOSIS — Z736 Limitation of activities due to disability: Secondary | ICD-10-CM | POA: Diagnosis not present

## 2022-02-24 DIAGNOSIS — M6282 Rhabdomyolysis: Secondary | ICD-10-CM | POA: Diagnosis present

## 2022-02-24 DIAGNOSIS — R400 Somnolence: Secondary | ICD-10-CM | POA: Diagnosis not present

## 2022-02-24 DIAGNOSIS — Z823 Family history of stroke: Secondary | ICD-10-CM | POA: Diagnosis not present

## 2022-02-24 DIAGNOSIS — R0689 Other abnormalities of breathing: Secondary | ICD-10-CM | POA: Diagnosis not present

## 2022-02-24 DIAGNOSIS — R2681 Unsteadiness on feet: Secondary | ICD-10-CM | POA: Diagnosis not present

## 2022-02-24 DIAGNOSIS — K219 Gastro-esophageal reflux disease without esophagitis: Secondary | ICD-10-CM | POA: Diagnosis not present

## 2022-02-24 DIAGNOSIS — Z66 Do not resuscitate: Secondary | ICD-10-CM | POA: Diagnosis not present

## 2022-02-24 DIAGNOSIS — Z886 Allergy status to analgesic agent status: Secondary | ICD-10-CM | POA: Diagnosis not present

## 2022-02-24 DIAGNOSIS — Z8249 Family history of ischemic heart disease and other diseases of the circulatory system: Secondary | ICD-10-CM | POA: Diagnosis not present

## 2022-02-24 DIAGNOSIS — I358 Other nonrheumatic aortic valve disorders: Secondary | ICD-10-CM | POA: Diagnosis present

## 2022-02-24 DIAGNOSIS — L8990 Pressure ulcer of unspecified site, unspecified stage: Secondary | ICD-10-CM | POA: Insufficient documentation

## 2022-02-24 DIAGNOSIS — I890 Lymphedema, not elsewhere classified: Secondary | ICD-10-CM | POA: Diagnosis present

## 2022-02-24 DIAGNOSIS — R29818 Other symptoms and signs involving the nervous system: Secondary | ICD-10-CM | POA: Diagnosis not present

## 2022-02-24 DIAGNOSIS — E876 Hypokalemia: Secondary | ICD-10-CM | POA: Diagnosis present

## 2022-02-24 DIAGNOSIS — E785 Hyperlipidemia, unspecified: Secondary | ICD-10-CM | POA: Diagnosis present

## 2022-02-24 DIAGNOSIS — Z741 Need for assistance with personal care: Secondary | ICD-10-CM | POA: Diagnosis not present

## 2022-02-24 DIAGNOSIS — W1830XA Fall on same level, unspecified, initial encounter: Secondary | ICD-10-CM | POA: Diagnosis present

## 2022-02-24 DIAGNOSIS — Z515 Encounter for palliative care: Secondary | ICD-10-CM | POA: Diagnosis not present

## 2022-02-24 DIAGNOSIS — I498 Other specified cardiac arrhythmias: Secondary | ICD-10-CM | POA: Insufficient documentation

## 2022-02-24 DIAGNOSIS — I729 Aneurysm of unspecified site: Secondary | ICD-10-CM

## 2022-02-24 DIAGNOSIS — A419 Sepsis, unspecified organism: Secondary | ICD-10-CM | POA: Diagnosis present

## 2022-02-24 DIAGNOSIS — L89152 Pressure ulcer of sacral region, stage 2: Secondary | ICD-10-CM | POA: Diagnosis present

## 2022-02-24 DIAGNOSIS — R531 Weakness: Principal | ICD-10-CM

## 2022-02-24 DIAGNOSIS — I5032 Chronic diastolic (congestive) heart failure: Secondary | ICD-10-CM | POA: Diagnosis present

## 2022-02-24 DIAGNOSIS — Z79899 Other long term (current) drug therapy: Secondary | ICD-10-CM

## 2022-02-24 DIAGNOSIS — M1990 Unspecified osteoarthritis, unspecified site: Secondary | ICD-10-CM | POA: Diagnosis not present

## 2022-02-24 DIAGNOSIS — Z7902 Long term (current) use of antithrombotics/antiplatelets: Secondary | ICD-10-CM

## 2022-02-24 DIAGNOSIS — Z1152 Encounter for screening for COVID-19: Secondary | ICD-10-CM

## 2022-02-24 DIAGNOSIS — E1159 Type 2 diabetes mellitus with other circulatory complications: Secondary | ICD-10-CM | POA: Diagnosis not present

## 2022-02-24 DIAGNOSIS — I671 Cerebral aneurysm, nonruptured: Secondary | ICD-10-CM | POA: Diagnosis present

## 2022-02-24 DIAGNOSIS — J45909 Unspecified asthma, uncomplicated: Secondary | ICD-10-CM | POA: Diagnosis present

## 2022-02-24 DIAGNOSIS — I639 Cerebral infarction, unspecified: Secondary | ICD-10-CM | POA: Diagnosis not present

## 2022-02-24 DIAGNOSIS — I350 Nonrheumatic aortic (valve) stenosis: Secondary | ICD-10-CM

## 2022-02-24 DIAGNOSIS — R29708 NIHSS score 8: Secondary | ICD-10-CM | POA: Diagnosis present

## 2022-02-24 DIAGNOSIS — R109 Unspecified abdominal pain: Secondary | ICD-10-CM | POA: Diagnosis not present

## 2022-02-24 DIAGNOSIS — E119 Type 2 diabetes mellitus without complications: Secondary | ICD-10-CM | POA: Diagnosis present

## 2022-02-24 DIAGNOSIS — I5030 Unspecified diastolic (congestive) heart failure: Secondary | ICD-10-CM

## 2022-02-24 DIAGNOSIS — M6259 Muscle wasting and atrophy, not elsewhere classified, multiple sites: Secondary | ICD-10-CM | POA: Diagnosis not present

## 2022-02-24 DIAGNOSIS — Z743 Need for continuous supervision: Secondary | ICD-10-CM | POA: Diagnosis not present

## 2022-02-24 DIAGNOSIS — R4182 Altered mental status, unspecified: Secondary | ICD-10-CM | POA: Diagnosis not present

## 2022-02-24 DIAGNOSIS — R54 Age-related physical debility: Secondary | ICD-10-CM | POA: Diagnosis present

## 2022-02-24 DIAGNOSIS — I7143 Infrarenal abdominal aortic aneurysm, without rupture: Secondary | ICD-10-CM

## 2022-02-24 LAB — CBC WITH DIFFERENTIAL/PLATELET
Abs Immature Granulocytes: 0.03 10*3/uL (ref 0.00–0.07)
Basophils Absolute: 0 10*3/uL (ref 0.0–0.1)
Basophils Relative: 0 %
Eosinophils Absolute: 0.1 10*3/uL (ref 0.0–0.5)
Eosinophils Relative: 1 %
HCT: 43.6 % (ref 39.0–52.0)
Hemoglobin: 14.3 g/dL (ref 13.0–17.0)
Immature Granulocytes: 0 %
Lymphocytes Relative: 10 %
Lymphs Abs: 1 10*3/uL (ref 0.7–4.0)
MCH: 27.6 pg (ref 26.0–34.0)
MCHC: 32.8 g/dL (ref 30.0–36.0)
MCV: 84.2 fL (ref 80.0–100.0)
Monocytes Absolute: 0.7 10*3/uL (ref 0.1–1.0)
Monocytes Relative: 7 %
Neutro Abs: 8.4 10*3/uL — ABNORMAL HIGH (ref 1.7–7.7)
Neutrophils Relative %: 82 %
Platelets: 156 10*3/uL (ref 150–400)
RBC: 5.18 MIL/uL (ref 4.22–5.81)
RDW: 15.3 % (ref 11.5–15.5)
WBC: 10.2 10*3/uL (ref 4.0–10.5)
nRBC: 0 % (ref 0.0–0.2)

## 2022-02-24 LAB — BLOOD GAS, VENOUS
Acid-Base Excess: 5.5 mmol/L — ABNORMAL HIGH (ref 0.0–2.0)
Bicarbonate: 31.1 mmol/L — ABNORMAL HIGH (ref 20.0–28.0)
O2 Saturation: 62.4 %
Patient temperature: 37
pCO2, Ven: 48 mmHg (ref 44–60)
pH, Ven: 7.42 (ref 7.25–7.43)
pO2, Ven: 41 mmHg (ref 32–45)

## 2022-02-24 LAB — URINALYSIS, ROUTINE W REFLEX MICROSCOPIC
Bacteria, UA: NONE SEEN
Bilirubin Urine: NEGATIVE
Glucose, UA: NEGATIVE mg/dL
Ketones, ur: NEGATIVE mg/dL
Nitrite: NEGATIVE
Protein, ur: 30 mg/dL — AB
Specific Gravity, Urine: 1.016 (ref 1.005–1.030)
pH: 5 (ref 5.0–8.0)

## 2022-02-24 LAB — COMPREHENSIVE METABOLIC PANEL
ALT: 30 U/L (ref 0–44)
AST: 124 U/L — ABNORMAL HIGH (ref 15–41)
Albumin: 3 g/dL — ABNORMAL LOW (ref 3.5–5.0)
Alkaline Phosphatase: 68 U/L (ref 38–126)
Anion gap: 14 (ref 5–15)
BUN: 36 mg/dL — ABNORMAL HIGH (ref 8–23)
CO2: 27 mmol/L (ref 22–32)
Calcium: 8.8 mg/dL — ABNORMAL LOW (ref 8.9–10.3)
Chloride: 101 mmol/L (ref 98–111)
Creatinine, Ser: 1.54 mg/dL — ABNORMAL HIGH (ref 0.61–1.24)
GFR, Estimated: 43 mL/min — ABNORMAL LOW (ref >60)
Glucose, Bld: 112 mg/dL — ABNORMAL HIGH (ref 70–99)
Potassium: 4.1 mmol/L (ref 3.5–5.1)
Sodium: 142 mmol/L (ref 135–145)
Total Bilirubin: 3.7 mg/dL — ABNORMAL HIGH (ref 0.3–1.2)
Total Protein: 6.3 g/dL — ABNORMAL LOW (ref 6.5–8.1)

## 2022-02-24 LAB — CBG MONITORING, ED: Glucose-Capillary: 87 mg/dL (ref 70–99)

## 2022-02-24 LAB — RESP PANEL BY RT-PCR (FLU A&B, COVID) ARPGX2
Influenza A by PCR: NEGATIVE
Influenza B by PCR: NEGATIVE
SARS Coronavirus 2 by RT PCR: NEGATIVE

## 2022-02-24 LAB — ACETAMINOPHEN LEVEL: Acetaminophen (Tylenol), Serum: <10 ug/mL — ABNORMAL LOW (ref 10–30)

## 2022-02-24 LAB — ETHANOL: Alcohol, Ethyl (B): <10 mg/dL (ref <10)

## 2022-02-24 LAB — TROPONIN I (HIGH SENSITIVITY): Troponin I (High Sensitivity): 81 ng/L — ABNORMAL HIGH (ref <18)

## 2022-02-24 LAB — BRAIN NATRIURETIC PEPTIDE: B Natriuretic Peptide: 647.4 pg/mL — ABNORMAL HIGH (ref 0.0–100.0)

## 2022-02-24 LAB — LACTIC ACID, PLASMA
Lactic Acid, Venous: 2.8 mmol/L (ref 0.5–1.9)
Lactic Acid, Venous: 2.5 mmol/L (ref 0.5–1.9)

## 2022-02-24 LAB — CK: Total CK: 2934 U/L — ABNORMAL HIGH (ref 49–397)

## 2022-02-24 MED ORDER — THIAMINE MONONITRATE 100 MG PO TABS
100.0000 mg | ORAL_TABLET | Freq: Every day | ORAL | Status: DC
  Administered 2022-02-24 – 2022-03-02 (×7): 100 mg via ORAL
  Filled 2022-02-24 (×7): qty 1

## 2022-02-24 MED ORDER — POLYETHYLENE GLYCOL 3350 17 G PO PACK: 17.0000 g | PACK | Freq: Every day | ORAL | Status: DC | PRN

## 2022-02-24 MED ORDER — LACTATED RINGERS IV SOLN: INTRAVENOUS | Status: DC

## 2022-02-24 MED ORDER — ADULT MULTIVITAMIN W/MINERALS CH
1.0000 | ORAL_TABLET | Freq: Every day | ORAL | Status: DC
  Administered 2022-02-24 – 2022-03-02 (×7): 1 via ORAL
  Filled 2022-02-24 (×7): qty 1

## 2022-02-24 MED ORDER — DIAZEPAM 5 MG/ML IJ SOLN: 1.0000 mg | Freq: Once | INTRAMUSCULAR | Status: DC

## 2022-02-24 MED ORDER — ACETAMINOPHEN 650 MG RE SUPP: 650.0000 mg | Freq: Four times a day (QID) | RECTAL | Status: DC | PRN

## 2022-02-24 MED ORDER — ENOXAPARIN SODIUM 40 MG/0.4ML IJ SOSY: 40.0000 mg | PREFILLED_SYRINGE | INTRAMUSCULAR | Status: DC

## 2022-02-24 MED ORDER — ACETAMINOPHEN 325 MG PO TABS: 650.0000 mg | ORAL_TABLET | Freq: Four times a day (QID) | ORAL | Status: DC | PRN

## 2022-02-24 MED ORDER — FOLIC ACID 1 MG PO TABS
1.0000 mg | ORAL_TABLET | Freq: Every day | ORAL | Status: DC
  Administered 2022-02-24 – 2022-03-02 (×7): 1 mg via ORAL
  Filled 2022-02-24 (×7): qty 1

## 2022-02-24 MED ORDER — SODIUM CHLORIDE 0.9 % IV BOLUS
1000.0000 mL | Freq: Once | INTRAVENOUS | Status: AC
  Administered 2022-02-24: 1000 mL via INTRAVENOUS

## 2022-02-24 MED ORDER — CLOPIDOGREL BISULFATE 75 MG PO TABS
75.0000 mg | ORAL_TABLET | Freq: Every day | ORAL | Status: DC
  Administered 2022-02-24 – 2022-03-02 (×7): 75 mg via ORAL
  Filled 2022-02-24 (×7): qty 1

## 2022-02-24 NOTE — Progress Notes (Signed)
Pt unable to lay flat and still for MRI. RN aware of why MRI is being delayed, due to RN work load unable to assist pt with medication at this time.

## 2022-02-24 NOTE — ED Notes (Signed)
Pt moved into a hospital bed, pt with pillow between knees to prevent skin breakdown. Pt with skin break down noted to entire buttocks, feet, folds between abd skin. Pt refuses to turn to right side, is lying on left side.

## 2022-02-24 NOTE — Assessment & Plan Note (Addendum)
Resolved. Most likely secondary to metabolic encephalopathy on admission.

## 2022-02-24 NOTE — Assessment & Plan Note (Addendum)
-   Restart home Plavix -Holding statin until rhabdomyolysis resolved.

## 2022-02-24 NOTE — Assessment & Plan Note (Addendum)
Patient presenting to the ED after sliding down his chair and falling onto the ground with a height of less than 1 foot.  Patient cannot recall slumping down, but states he has been feeling at his baseline.  Patient's daughter states patient frequently sleeps on the chair and occasionally struggles to get up to use his walker when he needs to use the restroom.  She suspects that is what occurred as he did have a bowel movement while in the floor.   -PT/OT recommending SNF.

## 2022-02-24 NOTE — Assessment & Plan Note (Addendum)
Patient has a history of severe aortic valve stenosis with no repeat echo since 2019.  Which was also confirmed on repeat echo yesterday. Patient follow-up with Dr. Gollan(cardiologist), most likely will not be a candidate for any surgical intervention. -Continue outpatient follow-up

## 2022-02-24 NOTE — ED Provider Notes (Signed)
[facility] Provider Note    Event Date/Time   First MD Initiated Contact with Patient 02/24/22 1354     (approximate)   History   Chief Complaint: Fall   HPI  [name] is a 88 y.o. male with a history of diabetes, hypertension, morbid obesity, chronic lymphedema, CHF who was sent to the ED due to a fall at home.  Daughter reported to EMS that patient had not been out of bed in a few days, and when she went to check on him this morning he had fallen on the ground and was unable to get up.  EMS also reports that his living environment was heavily contaminated with spoiled food, insects, and patient has urine and stool on him.     Physical Exam   Triage Vital Signs: ED Triage Vitals  Enc Vitals Group     BP      Pulse      Resp      Temp      Temp src      SpO2      Weight      Height      Head Circumference      Peak Flow      Pain Score      Pain Loc      Pain Edu?      Excl. in GC?     Most recent vital signs: Vitals:   02/24/22 1444  BP: 128/86  Pulse: (!) 53  Resp: 18  Temp: (!) 97.1 F (36.2 C)  SpO2: 100%    General: Awake, no distress.  Ill-appearing CV:  Good peripheral perfusion.  Regular rate and rhythm Resp:  Normal effort.  Clear to auscultation bilaterally Abd:  No distention.  Soft nontender Other:  Chronic lymphedema bilateral lower extremities.  Stage II sacral decubitus ulcer.  Extensive ecchymosis of the right gluteus and thigh.  Patient exhibits pain with passive flexion of bilateral hips, though no focal bony point tenderness.   ED Results / Procedures / Treatments   Labs (all labs ordered are listed, but only abnormal results are displayed) Labs Reviewed  ACETAMINOPHEN LEVEL  ETHANOL  BRAIN NATRIURETIC PEPTIDE  LACTIC ACID, PLASMA  LACTIC ACID, PLASMA  CBC WITH DIFFERENTIAL/PLATELET  URINALYSIS, ROUTINE W REFLEX MICROSCOPIC  COMPREHENSIVE METABOLIC PANEL  CK  CBG MONITORING, ED      EKG    RADIOLOGY CT head interpreted by me, negative for obvious intracranial hemorrhage.  Radiology report reviewed.   PROCEDURES:  Procedures   MEDICATIONS ORDERED IN ED: Medications  sodium chloride 0.9 % bolus 1,000 mL (has no administration in time range)     IMPRESSION / MDM / ASSESSMENT AND PLAN / ED COURSE  I reviewed the triage vital signs and the nursing notes.                              Differential diagnosis includes, but is not limited to, AKI, anemia, dehydration, electrolyte abnormality, rhabdomyolysis, UTI, pneumonia, hip/pelvis fracture, viral illness, intracranial hemorrhage  Patient's presentation is most consistent with acute presentation with potential threat to life or bodily function.  Patient presents with confusion and profound generalized weakness.  Vital signs unremarkable, not septic.  Differential is broad, especially since patient is not able to provide any meaningful history to guide evaluation.  CT head negative.  Will obtain ultrasound bilateral lower extremities, chest x-ray and pelvis x-ray, lab  panel.  Give IV fluids for hydration.       FINAL CLINICAL IMPRESSION(S) / ED DIAGNOSES   Final diagnoses:  Generalized weakness  Confusion     Rx / DC Orders   ED Discharge Orders     None        Note:  This document was prepared using Dragon voice recognition software and may include unintentional dictation errors.   Carrie Mew, MD 02/24/22 1525

## 2022-02-24 NOTE — Assessment & Plan Note (Addendum)
Per chart review, patient has a history of diabetes mellitus, however he is not currently on any antiglycemic agents.  CBGs within goal at this time. A1c of 4.7 -CBG checked were discontinued

## 2022-02-24 NOTE — ED Provider Notes (Signed)
Remaining evaluation reviewed, negative salicylate and alcohol level.  Urinalysis no obvious evidence of infection, sent for culture.  BNP is pending.  Patient remains resting with stable hemodynamics.  Discussed with hospitalist and consulted with Dr. Charleen Kirks is agreeable with plan for consultation and anticipated admission.  She did suggest obtaining a venous blood gas may be helpful, and I have ordered this study as well.     Delman Kitten, MD 02/24/22 [id number]

## 2022-02-24 NOTE — ED Notes (Signed)
RN messaged IV team RN Nira Conn and made aware pt is back in 1hall.

## 2022-02-24 NOTE — ED Triage Notes (Signed)
Pt to ED via [facility] from home for fall. Pt reports left sided leg pain with shortening and rotation noted. Pt with multiple wounds and bed sores.   EMS states roaches, old food, flies and bed bugs noted in pt's room. Pt taken to decon room on arrival.

## 2022-02-24 NOTE — ED Provider Notes (Signed)
EKG interpreted by me at 2120 heart rate 85 QRS 150 QTc 500 Right bundle branch block.  Sinus rhythm with occasional PAC   Delman Kitten, MD 02/24/22 2125

## 2022-02-24 NOTE — H&P (Signed)
History and Physical    PatientSim Choquette [name] IPJ:[id number] DOB: 1931/01/28 DOA: 02/24/2022 DOS: the patient was seen and examined on 02/24/2022 PCP: Leone Haven, MD  Patient coming from: Home  Chief Complaint:  Chief Complaint  Patient presents with   Fall   HPI: [name] is a 88 y.o. male with medical history significant of HFpEF, lymphedema, severe aortic stenosis, hypertension, hyperlipidemia, presenting to the ED after a ground-level fall.  Patient's stepdaughter and step grandson at bedside and assisted with history as patient is quite somnolent.  Patient's stepdaughter [name] states patient predominantly sits in his reclining chair at home and often sleeps there as he prefers that over walking to his bed.  She noticed him on his recliner this morning at approximately 8:30 AM and he was awake and at his baseline.  He declined any breakfast at that time.  When she returned home at approximately 1:30 PM, she noticed that he had slumped down the chair and was on the floor.  She was unable to lift him off the floor, and due to this EMS was called.  She denies noticing any recent fever, chills, nausea, vomiting, shortness of breath, chest pain, urinary changes, bowel movement changes.  She notes that he is quite sedentary but is able to ambulate with a walker and is mostly independent in his ADLs.  She does occasionally help with his medications and family helps with bathing, however patient often refuses to bathe.  When I discussed with [name] what occurred today, he he is unsure why EMS was called.  He states that he knows he is at [facility].  He denies any recent illnesses.  He is endorsing some right-sided pain, but otherwise denies any dizziness, chest pain, shortness of breath, cough, congestion, nausea, vomiting, diarrhea, urinary burning, urinary frequency.  Of note, [name] states that she had noticed increasing lower extremity edema over the past few weeks, and so she did  pick up a refill of torsemide and patient has been taking 2 tablets daily for the last week.  ED course: On arrival to the ED, patient was afebrile at 97.1 with heart rate of 53 and blood pressure of 128/86.  He was saturating at 100% on room air.  Initial lab work remarkable for potassium of 5.2 and an of 1.5.  CK elevated at 2934.  BNP elevated at 647.  Urinalysis with positive hemoglobin and RBCs, in addition to trace leukocytes and WBCs, however no bacteria seen. CT head obtained with no evidence of acute intracranial abnormality.  CT abdomen pelvis obtained with evidence of edema and skin thickening of the inferior gluteal region and infrarenal abdominal aortic aneurysm measuring 5.3 cm.  Bilateral hip/pelvis x-ray negative.  Chest x-ray with chronic changes but no acute cardiopulmonary disease.  Given given need for PT/OT evaluation and work-up for AKI, [facility] contacted for admission.  Review of Systems: As mentioned in the history of present illness. All other systems reviewed and are negative.  Past Medical History:  Diagnosis Date   (HFpEF) heart failure with preserved ejection fraction ([place])    a. 03/2018 Echo: EF 60-65%, Gr1 DD.   Aortic atherosclerosis ([place]) 05/2013   Per TEE   Arrhythmia    Asthma    CHF (congestive heart failure) (HCC)    Colon polyp    Coronary artery disease    a. Nonobstructive by 2004 cath b. 05/2013 NSTEMI/Cath: >M 30, LAD 67m, LCX 79m, RCA 30p, EF 60%, ? Sev AS (mod by  TEE).   GERD (gastroesophageal reflux disease)    Heart murmur    Hiatal hernia    Hyperlipidemia    Hypertension    Lymphedema    Mitral regurgitation    a. 03/2018 Echo: Mild to mod MR.   Obesity    Onychomycosis    Osteoarthritis    Recurrent cellulitis of lower extremity    Severe aortic stenosis    a. 2013 Echo: EF 55%, mod AS; b. TEE 05/2013: EF EF 60-65%, mod AS; c.10/2014 Echo: 55-60%, mod AS; d. 10/2016 Echo: EF 65-70%, Mod-Sev AS; e. 03/2018 [facility]: EF 60-65%, Sev AS, mild to mod AI.  Mean grad (S) , Valve Area (VTI): 0.92cm^2.   Past Surgical History:  Procedure Laterality Date   CARDIAC CATHETERIZATION  11/2002   CARDIAC CATHETERIZATION  05/21/2013   showing 30% oLM, 30% D1, 20% mLCx, 30% pRCA; EF 60%, severe AS (mean gradient 28 mmHg, peak 26, area 0.9)   CHOLECYSTECTOMY  2001   TRANSESOPHAGEAL ECHOCARDIOGRAM  05/22/2013   Preserved EF, moderate AS (valve area by planimetry between 1.0-1.26 cm sq), moderate aortic arch and descending aortic atherosclerosis.    TRANSTHORACIC ECHOCARDIOGRAM  05/19/2013   EF 60-65%, impaired diastolic function, mild LA dilatation, mild MR/AI/TR, mod AS, high normal RVSP (30.4 mmHg)   Social History:  reports that he quit smoking about 41 years ago. His smoking use included cigarettes. He has a 30.00 pack-year smoking history. He has never used smokeless tobacco. He reports that he does not drink alcohol and does not use drugs.  Allergies  Allergen Reactions   Aspirin Rash, Other (See Comments) and Hives    Reaction: Trouble breathing    Family History  Problem Relation Age of Onset   Heart disease Mother    Heart disease Father 86   Stroke Brother     Prior to Admission medications   Medication Sig Start Date End Date Taking? Authorizing Provider  clopidogrel (PLAVIX) 75 MG tablet TAKE 1 TABLET(75 MG) BY MOUTH DAILY Patient taking differently: Take 75 mg by mouth daily. 06/03/21  Yes Alver Sorrow, NP  metoprolol succinate (TOPROL-XL) 25 MG 24 hr tablet TAKE 1 TABLET(25 MG) BY MOUTH DAILY Patient taking differently: Take 25 mg by mouth daily. 04/14/21  Yes Alver Sorrow, NP    Physical Exam: Vitals:   02/24/22 1444 02/24/22 1829 02/24/22 2124  BP: 128/86 (!) 150/72 113/82  Pulse: (!) 53 (!) 58 62  Resp: 18 18   Temp: (!) 97.1 F (36.2 C)    TempSrc: Axillary    SpO2: 100% 100% 98%   Physical Exam Vitals and nursing note reviewed.  Constitutional:      General: He is sleeping.     Appearance: He is morbidly  obese. He is not ill-appearing, toxic-appearing or diaphoretic.  HENT:     Head: Normocephalic and atraumatic.     Nose: Nose normal.     Mouth/Throat:     Mouth: Mucous membranes are moist.     Pharynx: Oropharynx is clear.  Eyes:     Conjunctiva/sclera: Conjunctivae normal.     Pupils: Pupils are equal, round, and reactive to light.  Neck:     Comments: Unable to assess for JVD due to body habitus Cardiovascular:     Rate and Rhythm: Normal rate. Rhythm irregular.     Heart sounds: Murmur (4/6 systolic decrescendo murmur that obliterates S1.) heard.     No gallop.  Pulmonary:     Effort: Pulmonary effort  is normal. No respiratory distress.     Breath sounds: Normal breath sounds. No wheezing, rhonchi or rales.  Abdominal:     General: Bowel sounds are normal. There is no distension.     Palpations: Abdomen is soft.     Tenderness: There is no abdominal tenderness.  Musculoskeletal:     Cervical back: Neck supple.     Comments: Marked swelling of bilateral lower extremities extending from the feet up to the knee with only +1 pitting edema consistent with history of severe bilateral lymphedema.  Skin:    General: Skin is warm and dry.     Comments: Marked skin thickening and hyperpigmentation of bilateral lower extremities consistent with lymphedema.  Severely thickened toenails that are extremely long.  Evidence of intertrigo between toes.  Evidence of significant intertrigo in abdominal folds.  Unable to assess gluteal region  Neurological:     Mental Status: He is easily aroused.     Comments:  Patient alert and oriented x4.  Although somnolent and hard of hearing, he answers questions appropriately before very quickly falling back asleep.  He recognizes his family at bedside and able to recall their names appropriately.  5/5 strength of bilateral upper extremities  Able to move bilateral lower extremities, however unable to test strength due to severity of lymphedema.   Psychiatric:        Mood and Affect: Mood normal.        Behavior: Behavior normal. Behavior is cooperative.    Data Reviewed: C completely unremarkable.CMP remarkable for elevated glucose at 112, BUN of 36, creatinine of 1.54, calcium of 8.8, total protein of 6.3, albumin of 3.0, AST of 124 and total bilirubin of 3.7.  CK elevated at 2934.  BNP elevated at 647.  Lactic acid elevated at 2.8.  Urinalysis with moderate hemoglobin, proteinuria, trace leukocytes, 11-20 RBCs/hpf, 6-10 WBC/hpf.  Respiratory panel including COVID-19 and influenza negative.  Ethanol level is negative  EKG personally reviewed.  Sinus rhythm with a rate of 69.  Right bundle branch block.  CT ABDOMEN PELVIS WO CONTRAST  Result Date: 02/24/2022 CLINICAL DATA:  Fall pain EXAM: CT ABDOMEN AND PELVIS WITHOUT CONTRAST TECHNIQUE: Multidetector CT imaging of the abdomen and pelvis was performed following the standard protocol without IV contrast. RADIATION DOSE REDUCTION: This exam was performed according to the departmental dose-optimization program which includes automated exposure control, adjustment of the mA and/or kV according to patient size and/or use of iterative reconstruction technique. COMPARISON:  Ultrasound 02/14/2020, CT report 09/21/2023 FINDINGS: Lower chest: Lung bases demonstrate no acute airspace disease. Coronary vascular calcification. Cardiomegaly. Hepatobiliary: No focal liver abnormality is seen. Status post cholecystectomy. No biliary dilatation. Pancreas: Unremarkable. No pancreatic ductal dilatation or surrounding inflammatory changes. Spleen: Normal in size without focal abnormality. Adrenals/Urinary Tract: Adrenal glands are normal. Multiple low-density lesions within the bilateral kidneys are likely cysts but limited assessment without contrast, no specific imaging follow-up is recommended. No hydronephrosis. The bladder is unremarkable Stomach/Bowel: Stomach is within normal limits. Appendix appears  normal. No evidence of bowel wall thickening, distention, or inflammatory changes. Vascular/Lymphatic: Moderate aortic atherosclerosis. Aneurysmal dilatation of the distal infrarenal abdominal aorta at the bifurcation, this measures 5.3 cm. Aneurysmal dilatation of the left greater than right common iliac arteries. Left common iliac artery measures 4.6 cm, right common iliac artery measures 2.4 cm. No evidence for stranding or retroperitoneal hematoma. 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 Diffusely ectatic right internal iliac artery. Reproductive: Heterogeneous prostate calcification  without discrete mass Other: Negative for pelvic effusion or free air. Musculoskeletal: Edema and skin thickening within the inferior gluteal region and posterior aspects of the upper thighs with soft tissue infiltration. Ankylosis across the L5-S1 disc space. No definite acute osseous abnormality IMPRESSION: 1. No CT evidence for acute intra-abdominal or pelvic abnormality. 2. Edema and skin thickening within the inferior gluteal region and upper aspects of the thighs posteriorly, this could be secondary to dependent edema, trauma, or infection, correlate with direct inspection 3. Infrarenal abdominal aortic aneurysm measuring 5.3 cm. Recommend follow-up CT/MR every 6 months and vascular consultation. This recommendation follows ACR consensus guidelines: White Paper of the ACR Incidental Findings Committee II on Vascular Findings. J Am Coll Radiol 2013; 10:789-794. aneurysmal dilatation of the bilateral common iliac arteries, left greater than right with aneurysm of left internal iliac artery. Negative for Peri aortic stranding or retroperitoneal hematoma Electronically Signed   By: Jasmine Pang M.D.   On: 02/24/2022 17:02   DG Chest 1 View  Result Date: 02/24/2022 CLINICAL DATA:  Weakness and leg swelling. EXAM: CHEST  1 VIEW COMPARISON:  Chest radiograph 03/17/2018 FINDINGS: Chronic but progressive  elevation of right hemidiaphragm. The heart is enlarged. No focal airspace disease, pulmonary edema, pleural effusion or pneumothorax. Gaseous distention of bowel loops in the upper abdomen is partially visualized. IMPRESSION: 1. Chronic but progressive elevation of right hemidiaphragm. 2. Cardiomegaly. 3. Partially visualized gaseous distention of bowel loops in the upper abdomen, nonspecific. Electronically Signed   By: Narda Rutherford M.D.   On: 02/24/2022 16:24   DG Hip Unilat W or Wo Pelvis 2-3 Views Right  Result Date: 02/24/2022 CLINICAL DATA:  Weakness with leg swelling. EXAM: DG HIP (WITH OR WITHOUT PELVIS) 2-3V RIGHT COMPARISON:  None Available. FINDINGS: No acute fracture. No dislocation. Mild symmetric bilateral hip joint space narrowing with acetabular spurring. No erosions, avascular necrosis or evidence of focal bone lesion. No bony destructive change. Intact pubic rami. Pubic symphysis and sacroiliac joints are congruent. No focal soft tissue abnormalities are seen. IMPRESSION: No acute findings.  Mild symmetric bilateral hip osteoarthritis. Electronically Signed   By: Narda Rutherford M.D.   On: 02/24/2022 16:22   US Venous Img Lower Bilateral  Result Date: 02/24/2022 CLINICAL DATA:  Bilateral lower extremity pain and edema for the past 4 years. Evaluate for DVT. EXAM: BILATERAL LOWER EXTREMITY VENOUS DOPPLER ULTRASOUND TECHNIQUE: Gray-scale sonography with graded compression, as well as color Doppler and duplex ultrasound were performed to evaluate the lower extremity deep venous systems from the level of the common femoral vein and including the common femoral, femoral, profunda femoral, popliteal and calf veins including the posterior tibial, peroneal and gastrocnemius veins when visible. The superficial great saphenous vein was also interrogated. Spectral Doppler was utilized to evaluate flow at rest and with distal augmentation maneuvers in the common femoral, femoral and popliteal  veins. COMPARISON:  Right lower extremity venous Doppler ultrasound-12/03/2019 (negative); 12/19/2015 (negative; 05/08/2015 (negative); 03/26/2015 (negative); 06/01/2008 (negative) Bilateral lower extremity venous Doppler ultrasound-03/17/2018 (negative); 08/06/2017 (negative) FINDINGS: Examination is degraded due to patient body habitus and poor sonographic window RIGHT LOWER EXTREMITY Common Femoral Vein: No evidence of thrombus. Normal compressibility, respiratory phasicity and response to augmentation. Saphenofemoral Junction: No evidence of thrombus. Normal compressibility and flow on color Doppler imaging. Profunda Femoral Vein: No evidence of thrombus. Normal compressibility and flow on color Doppler imaging. Femoral Vein: No evidence of thrombus. Normal compressibility, respiratory phasicity and response to augmentation. Popliteal Vein: No evidence of thrombus.  Normal compressibility, respiratory phasicity and response to augmentation. Calf Veins: Appear patent where visualized. Superficial Great Saphenous Vein: No evidence of thrombus. Normal compressibility. Other Findings: There is a minimal amount of subcutaneous edema at the level of the right calf. LEFT LOWER EXTREMITY Common Femoral Vein: No evidence of thrombus. Normal compressibility, respiratory phasicity and response to augmentation. Saphenofemoral Junction: No evidence of thrombus. Normal compressibility and flow on color Doppler imaging. Profunda Femoral Vein: No evidence of thrombus. Normal compressibility and flow on color Doppler imaging. Femoral Vein: No evidence of thrombus. Normal compressibility, respiratory phasicity and response to augmentation. Popliteal Vein: No evidence of thrombus. Normal compressibility, respiratory phasicity and response to augmentation. Calf Veins: Appear patent where visualized. Superficial Great Saphenous Vein: No evidence of thrombus. Normal compressibility. Other Findings:  None. IMPRESSION: No evidence of  DVT within either lower extremity. Electronically Signed   By: Simonne Come M.D.   On: 02/24/2022 16:10   CT Head Wo Contrast  Result Date: 02/24/2022 CLINICAL DATA:  Altered mental status. EXAM: CT HEAD WITHOUT CONTRAST TECHNIQUE: Contiguous axial images were obtained from the base of the skull through the vertex without intravenous contrast. RADIATION DOSE REDUCTION: This exam was performed according to the departmental dose-optimization program which includes automated exposure control, adjustment of the mA and/or kV according to patient size and/or use of iterative reconstruction technique. COMPARISON:  Head CT 08/05/2017 FINDINGS: Brain: No evidence of acute infarction, hemorrhage, hydrocephalus, extra-axial collection or mass lesion/mass effect. Age related atrophy. Periventricular chronic small vessel ischemia with slight progression. Vascular: Atherosclerosis of skullbase vasculature without hyperdense vessel or abnormal calcification. Skull: No fracture or focal lesion. Sinuses/Orbits: Postsurgical change of the paranasal sinuses. No mastoid effusion. Other: Mild right-sided scalp edema. No confluent hematoma. IMPRESSION: 1. No acute intracranial abnormality. 2. Age related atrophy and chronic small vessel ischemia. Electronically Signed   By: Narda Rutherford M.D.   On: 02/24/2022 15:11     Results are pending, will review when available.  Assessment and Plan: * Fall Patient presenting to the ED after sliding down his chair and falling onto the ground with a height of less than 1 foot.  Patient cannot recall slumping down, but states he has been feeling at his baseline.  Patient's daughter states patient frequently sleeps on the chair and occasionally struggles to get up to use his walker when he needs to use the restroom.  She suspects that is what occurred as he did have a bowel movement while in the floor.    Due to lack of witnesses, and patient's inability to recall, differential is wide.   Patient is not hypoxic or tachycardic and is normotensive.  Telemetry does show sinus arrhythmia and he has a history of severe aortic stenosis that is not currently being treated, which would certainly place him at risk for syncope.  EKG with no acute ST or T wave changes concerning for acute ischemia, however will obtain troponins.  We will also obtain MRI brain to assess for ischemia.  - Telemetry monitoring - Echocardiogram ordered - Troponins pending - MRI brain ordered  Acute kidney injury (HCC) Creatinine on presentation elevated at 1.54.  Last BMP obtained approximately 1.5 years ago at which time, creatinine was 0.96.  Patient had restarted torsemide this week.  On examination, he has significant lower extremity edema, but otherwise no evidence of hypervolemia, including pulmonary Rales, sacral edema.  At this time, will hold home torsemide and obtain a renal ultrasound to evaluate for medical renal disease.  He has received 1 L of normal saline in the ED.  - Renal ultrasound pending - Repeat BMP in the a.m.  Rhabdomyolysis Patient presenting with elevated CK at 2934.  Patient's stepdaughter states patient is primarily sedentary in his recliner.  She states it is possible that he stayed in his recliner from yesterday evening till today at approximately 130 without moving.  He does have evidence of sacral ulcers to support this.  He has received 1 L of normal saline so far.  We will continue with gentle rehydration and repeat CK in the a.m.  I suspect elevated AST is secondary to rhabdo.  - Continue LR at 75 cc/HR for 10 hours - Repeat CK in the morning - Repeat CMP in the morning - Wound care for sacral ulcer  Somnolence Initially on presentation, ED provider concern for altered mental status.  On my examination, patient is alert and oriented x4 and patient's family states this is his baseline.  He is somnolent but awakes easily.  VBG evaluated with normal PCO2.  Would benefit from a  formal sleep study, however patient has refused in the past.  Would recommend outpatient follow-up with PCP and careful monitoring while admitted.  Sinus arrhythmia Telemetry with bradycardia with heart rate around 58, however EKG with normal heart rate.  Initial EKG consistent with sinus rhythm.  Repeat EKG personally reviewed, most consistent with sinus rhythm with frequent sinus pauses although it is being read as atrial fibrillation.  - Continue telemetry monitoring - Hold home metoprolol given intermittent bradycardia - Restart home metoprolol in the a.m. if resolution of bradycardia  Aortic valve stenosis, severe Patient has a history of severe aortic valve stenosis with no repeat echo since 2019.  Will repeat at this time to assess severity, as it may be playing a role in reason why patient fell out his chair,  and will assist with goals of care discussions  -Repeat echocardiogram pending  (HFpEF) heart failure with preserved ejection fraction [facility]) Patient has a history of HFpEF likely secondary to severe aortic stenosis.  On examination today, no evidence of pulmonary edema; I am unable to assess JVD due to body habitus.  He has significant bilateral lower extremity edema however I feel this is most likely secondary to lymphedema.  BNP is elevated at 640, however it has not been evaluated for 4 years and this may be his chronic elevation at this time.  - Repeat echocardiogram pending - Hold home torsemide given AKI - Hold home metoprolol  Lymphedema - Will order Unna boots  Generalized weakness In the setting of extremely sedentary lifestyle secondary to lymphedema.  Patient would likely benefit from rehabilitation, however family has stated they would not be comfortable with him going to a SNF.  They would be interested in CIR or home health.  -PT/OT  Diabetes mellitus (HCC) Per chart review, patient has a history of diabetes mellitus, however he is not currently on any  antiglycemic agents.  CBGs within goal at this time  -Hold off on SSI - A1c pending  Coronary artery disease, non-occlusive - Restart home Plavix  Hypertension Per chart review, patient has a history of hypertension that is currently being treated with metoprolol only.  Holding metoprolol at this time due to bradycardia.  He is normotensive at this time.     Advance Care Planning:   Code Status: Full Code discussed CODE STATUS with [name] and his family.  Initially [name] was leaning towards DNR, however after  discussing with his stepdaughter, decided on full code.  Consults: None  Family Communication: Patient's stepdaughter and step grandson updated at bedside  Severity of Illness: The appropriate patient status for this patient is OBSERVATION. Observation status is judged to be reasonable and necessary in order to provide the required intensity of service to ensure the patient's safety. The patient's presenting symptoms, physical exam findings, and initial radiographic and laboratory data in the context of their medical condition is felt to place them at decreased risk for further clinical deterioration. Furthermore, it is anticipated that the patient will be medically stable for discharge from the hospital within 2 midnights of admission.   Author: Verdene LennertIulia Favor Hackler, MD 02/24/2022 9:51 PM  For on call review [email or web address].

## 2022-02-24 NOTE — ED Notes (Signed)
Per ultrasound and MRI not able to complete exam du to pt noncompliance

## 2022-02-24 NOTE — Progress Notes (Signed)
Patient currently off the unit to CT. Fran Lowes, RN VAST

## 2022-02-24 NOTE — Assessment & Plan Note (Addendum)
Patient has a history of HFpEF likely secondary to severe aortic stenosis.   Also has an history of significant lower extremity lymphedema.  BNP elevated at 647. Patient also received IV fluid for rhabdomyolysis -Monitor volume status carefully

## 2022-02-24 NOTE — Assessment & Plan Note (Addendum)
CK peaked at 3124, now trending down. Patient received IV fluid -Monitor CK

## 2022-02-24 NOTE — ED Notes (Signed)
Pt is contracted to left side, not able to straighten out. Order for valium received from hospitalist for MRI.

## 2022-02-24 NOTE — Assessment & Plan Note (Addendum)
Resolved Creatinine on presentation elevated at 1.54.  Last BMP obtained approximately 1.5 years ago at which time, creatinine was 0.96.  Patient had restarted torsemide this week. Renal ultrasound was negative for any significant abnormality.  Bilateral echogenic kidneys with benign cyst. Renal function improved with IV fluid most likely secondary to dehydration and rhabdomyolysis. -Monitor renal function -Avoid nephrotoxins

## 2022-02-24 NOTE — ED Notes (Signed)
Pt in u/s

## 2022-02-24 NOTE — ED Notes (Signed)
IV team RN, Nira Conn, messaging this at this time stating that since pt is in CT the IV team consult needs to be cancelled and replaced once pt is back. This RN messaged back stating to IV team RN that she will receive a message once pt is back.

## 2022-02-24 NOTE — Assessment & Plan Note (Addendum)
Chronic issue and is being managed as outpatient. - Can consider Unna boots

## 2022-02-24 NOTE — Assessment & Plan Note (Addendum)
In the setting of extremely sedentary lifestyle secondary to lymphedema.  Patient would likely benefit from rehabilitation. PT/OT are recommending SNF

## 2022-02-24 NOTE — ED Notes (Addendum)
This RN attempting IV access x2. 2nd RN attempting IV access without success. MD made aware. IV team consult placed. RN only able to obtain green top for blood draw

## 2022-02-24 NOTE — ED Notes (Signed)
Pt transported to CT ?

## 2022-02-24 NOTE — ED Notes (Addendum)
IV team at bedside while pt getting EKG and IV team Heather, RN at bedside and immediately stating, "I cannot wait, I will come back" and walked away. This RN placing safety zone as EDT was almost complete with EKG and pt IV has been delay due to pt being absent getting scans and this is further delaying pt care. MD made aware of IV team RN Nira Conn rushing off and not speaking with this RN on how long an EKG would take to be completed.   This RN still unable to administer fluids and retrieve remainder of blood work needed.

## 2022-02-24 NOTE — Assessment & Plan Note (Signed)
Per chart review, patient has a history of hypertension that is currently being treated with metoprolol only.  Holding metoprolol at this time due to bradycardia.  He is normotensive at this time.

## 2022-02-24 NOTE — ED Provider Notes (Signed)
Patient currently resting in hallway bed, family at bedside  Labs at this point very notable for AKI with creatinine 1.5, elevated bilirubin and AST and also elevated CK with rhabdomyolysis.  He is laying on his side, respirating, able to provide urine sample, without acute distress, but is somewhat confused but easily arouses to stimuli.  No oxygen requirement.  Based on his lab findings and clinical history I ordered a CT of the abdomen pelvis without contrast to minimize risk for nephrotoxic agents to evaluate further for intra-abdominal pathology including work-up for history of abnormal liver function test.  CBC is pending.  Urinalysis pending.  Family updated on need for admission and they are understanding but ongoing work-up  DG Chest 1 View  Result Date: 02/24/2022 CLINICAL DATA:  Weakness and leg swelling. EXAM: CHEST  1 VIEW COMPARISON:  Chest radiograph 03/17/2018 FINDINGS: Chronic but progressive elevation of right hemidiaphragm. The heart is enlarged. No focal airspace disease, pulmonary edema, pleural effusion or pneumothorax. Gaseous distention of bowel loops in the upper abdomen is partially visualized. IMPRESSION: 1. Chronic but progressive elevation of right hemidiaphragm. 2. Cardiomegaly. 3. Partially visualized gaseous distention of bowel loops in the upper abdomen, nonspecific. Electronically Signed   By: Keith Rake M.D.   On: 02/24/2022 16:24   DG Hip Unilat W or Wo Pelvis 2-3 Views Right  Result Date: 02/24/2022 CLINICAL DATA:  Weakness with leg swelling. EXAM: DG HIP (WITH OR WITHOUT PELVIS) 2-3V RIGHT COMPARISON:  None Available. FINDINGS: No acute fracture. No dislocation. Mild symmetric bilateral hip joint space narrowing with acetabular spurring. No erosions, avascular necrosis or evidence of focal bone lesion. No bony destructive change. Intact pubic rami. Pubic symphysis and sacroiliac joints are congruent. No focal soft tissue abnormalities are seen. IMPRESSION: No  acute findings.  Mild symmetric bilateral hip osteoarthritis. Electronically Signed   By: Keith Rake M.D.   On: 02/24/2022 16:22   US Venous Img Lower Bilateral  Result Date: 02/24/2022 CLINICAL DATA:  Bilateral lower extremity pain and edema for the past 4 years. Evaluate for DVT. EXAM: BILATERAL LOWER EXTREMITY VENOUS DOPPLER ULTRASOUND TECHNIQUE: Gray-scale sonography with graded compression, as well as color Doppler and duplex ultrasound were performed to evaluate the lower extremity deep venous systems from the level of the common femoral vein and including the common femoral, femoral, profunda femoral, popliteal and calf veins including the posterior tibial, peroneal and gastrocnemius veins when visible. The superficial great saphenous vein was also interrogated. Spectral Doppler was utilized to evaluate flow at rest and with distal augmentation maneuvers in the common femoral, femoral and popliteal veins. COMPARISON:  Right lower extremity venous Doppler ultrasound-12/03/2019 (negative); 12/19/2015 (negative; 05/08/2015 (negative); 03/26/2015 (negative); 06/01/2008 (negative) Bilateral lower extremity venous Doppler ultrasound-03/17/2018 (negative); 08/06/2017 (negative) FINDINGS: Examination is degraded due to patient body habitus and poor sonographic window RIGHT LOWER EXTREMITY Common Femoral Vein: No evidence of thrombus. Normal compressibility, respiratory phasicity and response to augmentation. Saphenofemoral Junction: No evidence of thrombus. Normal compressibility and flow on color Doppler imaging. Profunda Femoral Vein: No evidence of thrombus. Normal compressibility and flow on color Doppler imaging. Femoral Vein: No evidence of thrombus. Normal compressibility, respiratory phasicity and response to augmentation. Popliteal Vein: No evidence of thrombus. Normal compressibility, respiratory phasicity and response to augmentation. Calf Veins: Appear patent where visualized. Superficial Great  Saphenous Vein: No evidence of thrombus. Normal compressibility. Other Findings: There is a minimal amount of subcutaneous edema at the level of the right calf. LEFT LOWER EXTREMITY Common Femoral Vein:  No evidence of thrombus. Normal compressibility, respiratory phasicity and response to augmentation. Saphenofemoral Junction: No evidence of thrombus. Normal compressibility and flow on color Doppler imaging. Profunda Femoral Vein: No evidence of thrombus. Normal compressibility and flow on color Doppler imaging. Femoral Vein: No evidence of thrombus. Normal compressibility, respiratory phasicity and response to augmentation. Popliteal Vein: No evidence of thrombus. Normal compressibility, respiratory phasicity and response to augmentation. Calf Veins: Appear patent where visualized. Superficial Great Saphenous Vein: No evidence of thrombus. Normal compressibility. Other Findings:  None. IMPRESSION: No evidence of DVT within either lower extremity. Electronically Signed   By: Simonne Come M.D.   On: 02/24/2022 16:10   CT Head Wo Contrast  Result Date: 02/24/2022 CLINICAL DATA:  Altered mental status. EXAM: CT HEAD WITHOUT CONTRAST TECHNIQUE: Contiguous axial images were obtained from the base of the skull through the vertex without intravenous contrast. RADIATION DOSE REDUCTION: This exam was performed according to the departmental dose-optimization program which includes automated exposure control, adjustment of the mA and/or kV according to patient size and/or use of iterative reconstruction technique. COMPARISON:  Head CT 08/05/2017 FINDINGS: Brain: No evidence of acute infarction, hemorrhage, hydrocephalus, extra-axial collection or mass lesion/mass effect. Age related atrophy. Periventricular chronic small vessel ischemia with slight progression. Vascular: Atherosclerosis of skullbase vasculature without hyperdense vessel or abnormal calcification. Skull: No fracture or focal lesion. Sinuses/Orbits:  Postsurgical change of the paranasal sinuses. No mastoid effusion. Other: Mild right-sided scalp edema. No confluent hematoma. IMPRESSION: 1. No acute intracranial abnormality. 2. Age related atrophy and chronic small vessel ischemia. Electronically Signed   By: Narda Rutherford M.D.   On: 02/24/2022 15:11      Sharyn Creamer, MD 02/24/22 [id number]

## 2022-02-24 NOTE — Assessment & Plan Note (Addendum)
Telemetry with bradycardia with heart rate around 58, however EKG with normal heart rate.  Initial EKG consistent with sinus rhythm.  Repeat EKG personally reviewed, most consistent with sinus rhythm with frequent sinus pauses although it is being read as atrial fibrillation. Home metoprolol was held due to concern of bradycardia.  Current heart rate in 60s. -Current continue holding metoprolol and monitor

## 2022-02-25 ENCOUNTER — Observation Stay: Payer: Medicare Other

## 2022-02-25 DIAGNOSIS — E119 Type 2 diabetes mellitus without complications: Secondary | ICD-10-CM | POA: Diagnosis present

## 2022-02-25 DIAGNOSIS — E860 Dehydration: Secondary | ICD-10-CM | POA: Diagnosis present

## 2022-02-25 DIAGNOSIS — Z515 Encounter for palliative care: Secondary | ICD-10-CM | POA: Diagnosis not present

## 2022-02-25 DIAGNOSIS — M6282 Rhabdomyolysis: Secondary | ICD-10-CM

## 2022-02-25 DIAGNOSIS — R2681 Unsteadiness on feet: Secondary | ICD-10-CM | POA: Diagnosis not present

## 2022-02-25 DIAGNOSIS — I5032 Chronic diastolic (congestive) heart failure: Secondary | ICD-10-CM

## 2022-02-25 DIAGNOSIS — Z1152 Encounter for screening for COVID-19: Secondary | ICD-10-CM | POA: Diagnosis not present

## 2022-02-25 DIAGNOSIS — R400 Somnolence: Secondary | ICD-10-CM

## 2022-02-25 DIAGNOSIS — I110 Hypertensive heart disease with heart failure: Secondary | ICD-10-CM | POA: Diagnosis present

## 2022-02-25 DIAGNOSIS — I5022 Chronic systolic (congestive) heart failure: Secondary | ICD-10-CM | POA: Diagnosis not present

## 2022-02-25 DIAGNOSIS — W1830XA Fall on same level, unspecified, initial encounter: Secondary | ICD-10-CM | POA: Diagnosis present

## 2022-02-25 DIAGNOSIS — K219 Gastro-esophageal reflux disease without esophagitis: Secondary | ICD-10-CM | POA: Diagnosis not present

## 2022-02-25 DIAGNOSIS — R531 Weakness: Secondary | ICD-10-CM

## 2022-02-25 DIAGNOSIS — I2510 Atherosclerotic heart disease of native coronary artery without angina pectoris: Secondary | ICD-10-CM

## 2022-02-25 DIAGNOSIS — I639 Cerebral infarction, unspecified: Secondary | ICD-10-CM | POA: Diagnosis not present

## 2022-02-25 DIAGNOSIS — Z736 Limitation of activities due to disability: Secondary | ICD-10-CM | POA: Diagnosis not present

## 2022-02-25 DIAGNOSIS — E876 Hypokalemia: Secondary | ICD-10-CM | POA: Diagnosis present

## 2022-02-25 DIAGNOSIS — J45909 Unspecified asthma, uncomplicated: Secondary | ICD-10-CM | POA: Diagnosis present

## 2022-02-25 DIAGNOSIS — R279 Unspecified lack of coordination: Secondary | ICD-10-CM | POA: Diagnosis not present

## 2022-02-25 DIAGNOSIS — N179 Acute kidney failure, unspecified: Secondary | ICD-10-CM | POA: Diagnosis present

## 2022-02-25 DIAGNOSIS — I350 Nonrheumatic aortic (valve) stenosis: Secondary | ICD-10-CM

## 2022-02-25 DIAGNOSIS — E1159 Type 2 diabetes mellitus with other circulatory complications: Secondary | ICD-10-CM

## 2022-02-25 DIAGNOSIS — R41841 Cognitive communication deficit: Secondary | ICD-10-CM | POA: Diagnosis not present

## 2022-02-25 DIAGNOSIS — Z743 Need for continuous supervision: Secondary | ICD-10-CM | POA: Diagnosis not present

## 2022-02-25 DIAGNOSIS — R410 Disorientation, unspecified: Secondary | ICD-10-CM | POA: Diagnosis not present

## 2022-02-25 DIAGNOSIS — I890 Lymphedema, not elsewhere classified: Secondary | ICD-10-CM | POA: Diagnosis present

## 2022-02-25 DIAGNOSIS — E785 Hyperlipidemia, unspecified: Secondary | ICD-10-CM | POA: Diagnosis present

## 2022-02-25 DIAGNOSIS — J45998 Other asthma: Secondary | ICD-10-CM | POA: Diagnosis not present

## 2022-02-25 DIAGNOSIS — Z741 Need for assistance with personal care: Secondary | ICD-10-CM | POA: Diagnosis not present

## 2022-02-25 DIAGNOSIS — W19XXXA Unspecified fall, initial encounter: Secondary | ICD-10-CM | POA: Diagnosis not present

## 2022-02-25 DIAGNOSIS — R4182 Altered mental status, unspecified: Secondary | ICD-10-CM | POA: Diagnosis not present

## 2022-02-25 DIAGNOSIS — A419 Sepsis, unspecified organism: Secondary | ICD-10-CM | POA: Insufficient documentation

## 2022-02-25 DIAGNOSIS — G9341 Metabolic encephalopathy: Secondary | ICD-10-CM | POA: Diagnosis present

## 2022-02-25 DIAGNOSIS — Y92009 Unspecified place in unspecified non-institutional (private) residence as the place of occurrence of the external cause: Secondary | ICD-10-CM | POA: Diagnosis not present

## 2022-02-25 DIAGNOSIS — Z823 Family history of stroke: Secondary | ICD-10-CM | POA: Diagnosis not present

## 2022-02-25 DIAGNOSIS — Z86010 Personal history of colonic polyps: Secondary | ICD-10-CM | POA: Diagnosis not present

## 2022-02-25 DIAGNOSIS — I10 Essential (primary) hypertension: Secondary | ICD-10-CM | POA: Diagnosis not present

## 2022-02-25 DIAGNOSIS — Z8249 Family history of ischemic heart disease and other diseases of the circulatory system: Secondary | ICD-10-CM | POA: Diagnosis not present

## 2022-02-25 DIAGNOSIS — M1990 Unspecified osteoarthritis, unspecified site: Secondary | ICD-10-CM | POA: Diagnosis not present

## 2022-02-25 DIAGNOSIS — Z886 Allergy status to analgesic agent status: Secondary | ICD-10-CM | POA: Diagnosis not present

## 2022-02-25 DIAGNOSIS — M6259 Muscle wasting and atrophy, not elsewhere classified, multiple sites: Secondary | ICD-10-CM | POA: Diagnosis not present

## 2022-02-25 DIAGNOSIS — Z66 Do not resuscitate: Secondary | ICD-10-CM | POA: Diagnosis not present

## 2022-02-25 DIAGNOSIS — I729 Aneurysm of unspecified site: Secondary | ICD-10-CM | POA: Diagnosis not present

## 2022-02-25 DIAGNOSIS — I6381 Other cerebral infarction due to occlusion or stenosis of small artery: Secondary | ICD-10-CM | POA: Diagnosis present

## 2022-02-25 DIAGNOSIS — I358 Other nonrheumatic aortic valve disorders: Secondary | ICD-10-CM | POA: Diagnosis present

## 2022-02-25 DIAGNOSIS — Z87891 Personal history of nicotine dependence: Secondary | ICD-10-CM | POA: Diagnosis not present

## 2022-02-25 DIAGNOSIS — L89152 Pressure ulcer of sacral region, stage 2: Secondary | ICD-10-CM | POA: Diagnosis present

## 2022-02-25 DIAGNOSIS — R29818 Other symptoms and signs involving the nervous system: Secondary | ICD-10-CM | POA: Diagnosis not present

## 2022-02-25 DIAGNOSIS — N281 Cyst of kidney, acquired: Secondary | ICD-10-CM | POA: Diagnosis not present

## 2022-02-25 DIAGNOSIS — I671 Cerebral aneurysm, nonruptured: Secondary | ICD-10-CM | POA: Diagnosis present

## 2022-02-25 DIAGNOSIS — R58 Hemorrhage, not elsewhere classified: Secondary | ICD-10-CM | POA: Diagnosis present

## 2022-02-25 LAB — CBC WITH DIFFERENTIAL/PLATELET
Abs Immature Granulocytes: 0.03 10*3/uL (ref 0.00–0.07)
Basophils Absolute: 0 10*3/uL (ref 0.0–0.1)
Basophils Relative: 0 %
Eosinophils Absolute: 0.1 10*3/uL (ref 0.0–0.5)
Eosinophils Relative: 2 %
HCT: 39.3 % (ref 39.0–52.0)
Hemoglobin: 13.1 g/dL (ref 13.0–17.0)
Immature Granulocytes: 0 %
Lymphocytes Relative: 14 %
Lymphs Abs: 1 10*3/uL (ref 0.7–4.0)
MCH: 28.2 pg (ref 26.0–34.0)
MCHC: 33.3 g/dL (ref 30.0–36.0)
MCV: 84.7 fL (ref 80.0–100.0)
Monocytes Absolute: 0.7 10*3/uL (ref 0.1–1.0)
Monocytes Relative: 9 %
Neutro Abs: 5.5 10*3/uL (ref 1.7–7.7)
Neutrophils Relative %: 75 %
Platelets: 118 10*3/uL — ABNORMAL LOW (ref 150–400)
RBC: 4.64 MIL/uL (ref 4.22–5.81)
RDW: 15.3 % (ref 11.5–15.5)
WBC: 7.3 10*3/uL (ref 4.0–10.5)
nRBC: 0 % (ref 0.0–0.2)

## 2022-02-25 LAB — COMPREHENSIVE METABOLIC PANEL
ALT: 36 U/L (ref 0–44)
AST: 120 U/L — ABNORMAL HIGH (ref 15–41)
Albumin: 2.6 g/dL — ABNORMAL LOW (ref 3.5–5.0)
Alkaline Phosphatase: 64 U/L (ref 38–126)
Anion gap: 9 (ref 5–15)
BUN: 36 mg/dL — ABNORMAL HIGH (ref 8–23)
CO2: 28 mmol/L (ref 22–32)
Calcium: 8.3 mg/dL — ABNORMAL LOW (ref 8.9–10.3)
Chloride: 107 mmol/L (ref 98–111)
Creatinine, Ser: 1.22 mg/dL (ref 0.61–1.24)
GFR, Estimated: 57 mL/min — ABNORMAL LOW (ref >60)
Glucose, Bld: 91 mg/dL (ref 70–99)
Potassium: 2.9 mmol/L — ABNORMAL LOW (ref 3.5–5.1)
Sodium: 144 mmol/L (ref 135–145)
Total Bilirubin: 2.8 mg/dL — ABNORMAL HIGH (ref 0.3–1.2)
Total Protein: 5.6 g/dL — ABNORMAL LOW (ref 6.5–8.1)

## 2022-02-25 LAB — HEMOGLOBIN A1C
Hgb A1c MFr Bld: 4.7 % — ABNORMAL LOW (ref 4.8–5.6)
Mean Plasma Glucose: 88.19 mg/dL

## 2022-02-25 LAB — CK: Total CK: 3124 U/L — ABNORMAL HIGH (ref 49–397)

## 2022-02-25 LAB — TROPONIN I (HIGH SENSITIVITY)
Troponin I (High Sensitivity): 82 ng/L — ABNORMAL HIGH (ref <18)
Troponin I (High Sensitivity): 58 ng/L — ABNORMAL HIGH (ref <18)
Troponin I (High Sensitivity): 74 ng/L — ABNORMAL HIGH (ref <18)

## 2022-02-25 LAB — MAGNESIUM: Magnesium: 2.3 mg/dL (ref 1.7–2.4)

## 2022-02-25 MED ORDER — ENOXAPARIN SODIUM 60 MG/0.6ML IJ SOSY
50.0000 mg | PREFILLED_SYRINGE | INTRAMUSCULAR | Status: DC
  Administered 2022-02-25 – 2022-03-02 (×6): 50 mg via SUBCUTANEOUS
  Filled 2022-02-25 (×6): qty 0.6

## 2022-02-25 MED ORDER — SODIUM CHLORIDE 0.9 % IV BOLUS
1000.0000 mL | Freq: Once | INTRAVENOUS | Status: AC
  Administered 2022-02-25: 1000 mL via INTRAVENOUS

## 2022-02-25 MED ORDER — SODIUM CHLORIDE 0.9 % IV SOLN: INTRAVENOUS | Status: AC

## 2022-02-25 MED ORDER — POTASSIUM CHLORIDE CRYS ER 20 MEQ PO TBCR
40.0000 meq | EXTENDED_RELEASE_TABLET | Freq: Once | ORAL | Status: AC
  Administered 2022-02-25: 40 meq via ORAL
  Filled 2022-02-25: qty 2

## 2022-02-25 MED ORDER — POTASSIUM CHLORIDE 10 MEQ/100ML IV SOLN
10.0000 meq | INTRAVENOUS | Status: AC
  Administered 2022-02-25 (×4): 10 meq via INTRAVENOUS
  Filled 2022-02-25 (×2): qty 100

## 2022-02-25 MED ORDER — LORAZEPAM 2 MG/ML IJ SOLN
0.5000 mg | Freq: Once | INTRAMUSCULAR | Status: AC
  Administered 2022-02-25: 0.5 mg via INTRAVENOUS
  Filled 2022-02-25: qty 1

## 2022-02-25 MED ORDER — MORPHINE SULFATE (PF) 2 MG/ML IV SOLN: 1.0000 mg | Freq: Once | INTRAVENOUS | Status: DC

## 2022-02-25 MED ORDER — POTASSIUM CHLORIDE CRYS ER 20 MEQ PO TBCR
40.0000 meq | EXTENDED_RELEASE_TABLET | Freq: Once | ORAL | Status: DC
  Filled 2022-02-25: qty 2

## 2022-02-25 NOTE — Progress Notes (Addendum)
PROGRESS NOTE    [name]  ZOX:[id number] DOB: 1930/07/21 DOA: 02/24/2022 PCP: Glori LuisSonnenberg, Eric G, MD    Brief Narrative:  [name] is a 88 y.o. male with medical history significant of HFpEF, lymphedema, severe aortic stenosis, hypertension, hyperlipidemia, presented to hospital after sustaining a ground-level fall. Patient predominantly sits in his reclining chair at home and often sleeps there as he prefers that over walking to his bed.  Patient was noted to have slumped down the chair and was on the floor.  She was unable to lift him off the floor, and due to this EMS was called.  In the ED, vitals were stable.  Initial lab work remarkable for potassium of 5.2.  CK elevated at 2934.  BNP elevated at 647.  Urinalysis with positive hemoglobin and RBCs, in addition to trace leukocytes and WBCs, however no bacteria seen. CT head obtained with no evidence of acute intracranial abnormality.  CT abdomen pelvis obtained with evidence of edema and skin thickening of the inferior gluteal region and infrarenal abdominal aortic aneurysm measuring 5.3 cm.  Bilateral hip/pelvis x-ray negative.  Chest x-ray with chronic changes but no acute cardiopulmonary disease.  Given given need for PT/OT evaluation and work-up for AKI, [facility] was contacted for admission.  Assessment and plan  Fall From recliner to the floor.  Cannot rule out syncope due to history of aortic stenosis.  Continue telemetry monitor.  EKG was unremarkable.  Check 2D echocardiogram, MRI of the brain.  Acute kidney injury  Creatinine on presentation elevated at 1.54.  Last BMP with creatinine of 0.96.  Patient was restarted on torsemide this week.  We will continue to hold torsemide.  Check renal ultrasound.  Monitor BMP.    Rhabdomyolysis Patient presenting with elevated CK at 2934.  Continue IV hydration.  CK level today at 3124.  Add normal saline 75 mm/h for 1 more Weld.  Reassess in AM.  Hypokalemia.  Potassium of 2.9.  We will  replenish through IV and orally.  Magnesium level of 2.3.  Check levels in AM.  Somnolence likely metabolic encephalopathy. At baseline patient is fully oriented.,  We will continue to monitor.   Sinus arrhythmia Continue telemetry monitor.  Metoprolol on hold due to intermittent bradycardia.  Restart metoprolol if resolution of bradycardia.   Severe aortic valve stenosis. No repeat echo since 2019.  Check 2D echo   (HFpEF) heart failure with preserved ejection fraction (HCC) Lower extremity edema likely secondary to lymphedema.  BNP is elevated at 640, however it has not been evaluated for 4 years and this may be his chronic elevation at this time.  Check 2D echocardiogram.  Hold metoprolol and torsemide for now.  Lymphedema Unna boots   Generalized weakness Consult PT. .  PT has recommended skilled nursing facility.  We will consult TOC.  Diabetes mellitus (HCC) Not on medications.  Latest hemoglobin A1c of 4.7 Diet controlled.   Coronary artery disease, non-occlusive Continue Plavix   Hypertension Metoprolol on hold at this time.    DVT prophylaxis:    Lovenox subcu  Code Status:     Code Status: Full Code  Disposition: Skilled nursing facility as per PT recommendation.    Status is: Observation  The patient will require care spanning > 2 midnights and should be moved to inpatient because: AKI,  rhabdomyolysis, electrolyte imbalances, follow-up   Family Communication: Spoke with the patient's family at bedside.  Consultants:  None  Procedures:  None  Antimicrobials:  None  Anti-infectives (  From admission, onward)    None       Subjective: Today, patient was seen and examined at bedside.  As per the patient's family at bedside patient is more alert awake and communicative today.  He did eat some.  No nausea vomiting fever reported.  Objective: Vitals:   02/25/22 0300 02/25/22 0423 02/25/22 0539 02/25/22 0735  BP:  111/89 (!) 134/48   Pulse:  88 68    Resp:   18   Temp: (!) 97.5 F (36.4 C)     TempSrc: Axillary     SpO2:   96%   Weight:    106.1 kg  Height:    5\' 11"  (1.803 m)    Intake/Output Summary (Last 24 hours) at 02/25/2022 1202 Last data filed at 02/25/2022 0654 Gross per 24 hour  Intake 2540 ml  Output --  Net 2540 ml   Filed Weights   02/25/22 0735  Weight: 106.1 kg    Physical Examination: Body mass index is 32.64 kg/m.   General: Obese built, not in obvious distress, on room air, elderly male HENT:   No scleral pallor or icterus noted. Oral mucosa is moist.  Chest:  Clear breath sounds.  Diminished breath sounds bilaterally. No crackles or wheezes.  CVS: S1 &S2 heard.  Systolic murmur noted .  Irregular rhythm. Abdomen: Soft, nontender, nondistended.  Bowel sounds are heard.   Extremities: No cyanosis, clubbing with bilateral lower extremity edema hyperpigmentation suggestive of lymphedema.   Psych: Alert, awake and oriented, normal mood CNS:  No cranial nerve deficits.  Moves all extremities Skin: Warm and dry.  No rashes noted.  Data Reviewed:   CBC: Recent Labs  Lab 02/24/22 1730 02/25/22 0633  WBC 10.2 7.3  NEUTROABS 8.4* 5.5  HGB 14.3 13.1  HCT 43.6 39.3  MCV 84.2 84.7  PLT 156 118*    Basic Metabolic Panel: Recent Labs  Lab 02/24/22 1510 02/25/22 0220 02/25/22 0350  NA 142 144  --   K 4.1 2.9*  --   CL 101 107  --   CO2 27 28  --   GLUCOSE 112* 91  --   BUN 36* 36*  --   CREATININE 1.54* 1.22  --   CALCIUM 8.8* 8.3*  --   MG  --   --  2.3    Liver Function Tests: Recent Labs  Lab 02/24/22 1510 02/25/22 0220  AST 124* 120*  ALT 30 36  ALKPHOS 68 64  BILITOT 3.7* 2.8*  PROT 6.3* 5.6*  ALBUMIN 3.0* 2.6*     Radiology Studies: MR BRAIN WO CONTRAST  Result Date: 02/25/2022 CLINICAL DATA:  88 year old male with altered mental status.  Fall. EXAM: MRI HEAD WITHOUT CONTRAST TECHNIQUE: Multiplanar, multiecho pulse sequences of the brain and surrounding structures were  obtained without intravenous contrast. COMPARISON:  Head CT yesterday. FINDINGS: Study is mildly degraded by motion artifact despite repeated imaging attempts. Brain: Small linear restricted diffusion in the left periatrial white matter series 5, image 25. Mild acute and underlying patchy chronic white matter T2 and FLAIR hyperintensity there. Possible tiny additional focus of restricted diffusion in the left occipital pole versus artifact (series 5, image 17). Mild for age other mostly periventricular white matter T2 and FLAIR hyperintensity. Mild T2 heterogeneity in the bilateral basal ganglia. Thalami, brainstem and cerebellum appear negative. No cortical encephalomalacia identified. No other restricted diffusion. No midline shift, mass effect, evidence of mass lesion, ventriculomegaly, extra-axial collection or acute intracranial hemorrhage. Cervicomedullary junction  and pituitary are within normal limits. No chronic cerebral blood products. Vascular: Major intracranial vascular flow voids are preserved. Intracranial artery tortuosity. Distal left vertebral artery appears dominant. Skull and upper cervical spine: Negative for age visible cervical spine. Visualized bone marrow signal is within normal limits. Sinuses/Orbits: Negative orbits. Previous paranasal sinus surgery with residual sinus mucosal thickening and trace fluid. Other: Mastoid air cells are well aerated. Grossly negative visible internal auditory structures. Negative visible scalp and face. IMPRESSION: 1. Small acute lacunar infarct in the left periatrial white matter. And questionable additional punctate infarct in the left occipital pole. No associated hemorrhage or mass effect. 2. Underlying mild for age chronic small vessel disease. Electronically Signed   By: Odessa Fleming M.D.   On: 02/25/2022 11:10   US RENAL  Result Date: 02/25/2022 CLINICAL DATA:  Acute kidney injury EXAM: RENAL / URINARY TRACT ULTRASOUND COMPLETE COMPARISON:  02/24/2022  CT abdomen/pelvis. FINDINGS: Right Kidney: Renal measurements: 10.7 x 6.2 x 6.2 cm = volume: 215 mL. No hydronephrosis. Minimally complex 3.0 x 2.0 x 2.4 cm lower right renal cyst with thin internal septation. Exophytic simple 3.1 x 2.7 x 2.8 cm lateral interpolar right renal cyst and simple 3.6 x 2.3 x 2.3 cm lower right renal cyst. Background renal parenchyma is echogenic and normal thickness. Left Kidney: Renal measurements: 10.6 x 6.1 x 5.1 cm = volume: 174 mL. No hydronephrosis. Simple 2.4 x 1.6 x 2.1 cm upper left renal cyst. Exophytic simple 1.2 x 1.2 x 1.0 cm upper left renal cyst. Simple parapelvic lower left renal 1.6 cm cyst. Background renal parenchyma is echogenic and normal thickness. Bladder: Collapsed by indwelling Foley catheter, precluding assessment. Other: None. IMPRESSION: 1. No hydronephrosis. 2. Echogenic normal size kidneys bilaterally, compatible with nonspecific acute renal parenchymal disease. 3. Benign appearing bilateral renal cysts, requiring no further imaging follow-up. 4. Bladder is collapsed by indwelling Foley catheter, precluding assessment. Electronically Signed   By: Delbert Phenix M.D.   On: 02/25/2022 09:16   CT ABDOMEN PELVIS WO CONTRAST  Result Date: 02/24/2022 CLINICAL DATA:  Fall pain EXAM: CT ABDOMEN AND PELVIS WITHOUT CONTRAST TECHNIQUE: Multidetector CT imaging of the abdomen and pelvis was performed following the standard protocol without IV contrast. RADIATION DOSE REDUCTION: This exam was performed according to the departmental dose-optimization program which includes automated exposure control, adjustment of the mA and/or kV according to patient size and/or use of iterative reconstruction technique. COMPARISON:  Ultrasound 02/14/2020, CT report 09/21/2023 FINDINGS: Lower chest: Lung bases demonstrate no acute airspace disease. Coronary vascular calcification. Cardiomegaly. Hepatobiliary: No focal liver abnormality is seen. Status post cholecystectomy. No biliary  dilatation. Pancreas: Unremarkable. No pancreatic ductal dilatation or surrounding inflammatory changes. Spleen: Normal in size without focal abnormality. Adrenals/Urinary Tract: Adrenal glands are normal. Multiple low-density lesions within the bilateral kidneys are likely cysts but limited assessment without contrast, no specific imaging follow-up is recommended. No hydronephrosis. The bladder is unremarkable Stomach/Bowel: Stomach is within normal limits. Appendix appears normal. No evidence of bowel wall thickening, distention, or inflammatory changes. Vascular/Lymphatic: Moderate aortic atherosclerosis. Aneurysmal dilatation of the distal infrarenal abdominal aorta at the bifurcation, this measures 5.3 cm. Aneurysmal dilatation of the left greater than right common iliac arteries. Left common iliac artery measures 4.6 cm, right common iliac artery measures 2.4 cm. No evidence for stranding or retroperitoneal hematoma. 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 Diffusely ectatic right internal iliac artery. Reproductive: Heterogeneous prostate calcification without discrete mass Other: Negative for pelvic effusion or  free air. Musculoskeletal: Edema and skin thickening within the inferior gluteal region and posterior aspects of the upper thighs with soft tissue infiltration. Ankylosis across the L5-S1 disc space. No definite acute osseous abnormality IMPRESSION: 1. No CT evidence for acute intra-abdominal or pelvic abnormality. 2. Edema and skin thickening within the inferior gluteal region and upper aspects of the thighs posteriorly, this could be secondary to dependent edema, trauma, or infection, correlate with direct inspection 3. Infrarenal abdominal aortic aneurysm measuring 5.3 cm. Recommend follow-up CT/MR every 6 months and vascular consultation. This recommendation follows ACR consensus guidelines: White Paper of the ACR Incidental Findings Committee II on Vascular Findings. J Am  Coll Radiol 2013; 10:789-794. aneurysmal dilatation of the bilateral common iliac arteries, left greater than right with aneurysm of left internal iliac artery. Negative for Peri aortic stranding or retroperitoneal hematoma Electronically Signed   By: Donavan Foil M.D.   On: 02/24/2022 17:02   DG Chest 1 View  Result Date: 02/24/2022 CLINICAL DATA:  Weakness and leg swelling. EXAM: CHEST  1 VIEW COMPARISON:  Chest radiograph 03/17/2018 FINDINGS: Chronic but progressive elevation of right hemidiaphragm. The heart is enlarged. No focal airspace disease, pulmonary edema, pleural effusion or pneumothorax. Gaseous distention of bowel loops in the upper abdomen is partially visualized. IMPRESSION: 1. Chronic but progressive elevation of right hemidiaphragm. 2. Cardiomegaly. 3. Partially visualized gaseous distention of bowel loops in the upper abdomen, nonspecific. Electronically Signed   By: Keith Rake M.D.   On: 02/24/2022 16:24   DG Hip Unilat W or Wo Pelvis 2-3 Views Right  Result Date: 02/24/2022 CLINICAL DATA:  Weakness with leg swelling. EXAM: DG HIP (WITH OR WITHOUT PELVIS) 2-3V RIGHT COMPARISON:  None Available. FINDINGS: No acute fracture. No dislocation. Mild symmetric bilateral hip joint space narrowing with acetabular spurring. No erosions, avascular necrosis or evidence of focal bone lesion. No bony destructive change. Intact pubic rami. Pubic symphysis and sacroiliac joints are congruent. No focal soft tissue abnormalities are seen. IMPRESSION: No acute findings.  Mild symmetric bilateral hip osteoarthritis. Electronically Signed   By: Keith Rake M.D.   On: 02/24/2022 16:22   US Venous Img Lower Bilateral  Result Date: 02/24/2022 CLINICAL DATA:  Bilateral lower extremity pain and edema for the past 4 years. Evaluate for DVT. EXAM: BILATERAL LOWER EXTREMITY VENOUS DOPPLER ULTRASOUND TECHNIQUE: Gray-scale sonography with graded compression, as well as color Doppler and duplex  ultrasound were performed to evaluate the lower extremity deep venous systems from the level of the common femoral vein and including the common femoral, femoral, profunda femoral, popliteal and calf veins including the posterior tibial, peroneal and gastrocnemius veins when visible. The superficial great saphenous vein was also interrogated. Spectral Doppler was utilized to evaluate flow at rest and with distal augmentation maneuvers in the common femoral, femoral and popliteal veins. COMPARISON:  Right lower extremity venous Doppler ultrasound-12/03/2019 (negative); 12/19/2015 (negative; 05/08/2015 (negative); 03/26/2015 (negative); 06/01/2008 (negative) Bilateral lower extremity venous Doppler ultrasound-03/17/2018 (negative); 08/06/2017 (negative) FINDINGS: Examination is degraded due to patient body habitus and poor sonographic window RIGHT LOWER EXTREMITY Common Femoral Vein: No evidence of thrombus. Normal compressibility, respiratory phasicity and response to augmentation. Saphenofemoral Junction: No evidence of thrombus. Normal compressibility and flow on color Doppler imaging. Profunda Femoral Vein: No evidence of thrombus. Normal compressibility and flow on color Doppler imaging. Femoral Vein: No evidence of thrombus. Normal compressibility, respiratory phasicity and response to augmentation. Popliteal Vein: No evidence of thrombus. Normal compressibility, respiratory phasicity and response to augmentation. Calf  Veins: Appear patent where visualized. Superficial Great Saphenous Vein: No evidence of thrombus. Normal compressibility. Other Findings: There is a minimal amount of subcutaneous edema at the level of the right calf. LEFT LOWER EXTREMITY Common Femoral Vein: No evidence of thrombus. Normal compressibility, respiratory phasicity and response to augmentation. Saphenofemoral Junction: No evidence of thrombus. Normal compressibility and flow on color Doppler imaging. Profunda Femoral Vein: No  evidence of thrombus. Normal compressibility and flow on color Doppler imaging. Femoral Vein: No evidence of thrombus. Normal compressibility, respiratory phasicity and response to augmentation. Popliteal Vein: No evidence of thrombus. Normal compressibility, respiratory phasicity and response to augmentation. Calf Veins: Appear patent where visualized. Superficial Great Saphenous Vein: No evidence of thrombus. Normal compressibility. Other Findings:  None. IMPRESSION: No evidence of DVT within either lower extremity. Electronically Signed   By: Simonne Come M.D.   On: 02/24/2022 16:10   CT Head Wo Contrast  Result Date: 02/24/2022 CLINICAL DATA:  Altered mental status. EXAM: CT HEAD WITHOUT CONTRAST TECHNIQUE: Contiguous axial images were obtained from the base of the skull through the vertex without intravenous contrast. RADIATION DOSE REDUCTION: This exam was performed according to the departmental dose-optimization program which includes automated exposure control, adjustment of the mA and/or kV according to patient size and/or use of iterative reconstruction technique. COMPARISON:  Head CT 08/05/2017 FINDINGS: Brain: No evidence of acute infarction, hemorrhage, hydrocephalus, extra-axial collection or mass lesion/mass effect. Age related atrophy. Periventricular chronic small vessel ischemia with slight progression. Vascular: Atherosclerosis of skullbase vasculature without hyperdense vessel or abnormal calcification. Skull: No fracture or focal lesion. Sinuses/Orbits: Postsurgical change of the paranasal sinuses. No mastoid effusion. Other: Mild right-sided scalp edema. No confluent hematoma. IMPRESSION: 1. No acute intracranial abnormality. 2. Age related atrophy and chronic small vessel ischemia. Electronically Signed   By: Narda Rutherford M.D.   On: 02/24/2022 15:11      LOS: 0 days    Joycelyn Das, MD [facility] Available via Epic secure chat 7am-7pm After these hours, please refer  to coverage provider listed on [email or web address] 02/25/2022, 12:02 PM

## 2022-02-25 NOTE — ED Notes (Signed)
Sharion Settler, np notified of troponin 81 value and inability to perform MRI due to pt's contracture. Per brenda, hold mri at this time.

## 2022-02-25 NOTE — ED Notes (Signed)
After head of bed lowered, pt no longer complains of pain.

## 2022-02-25 NOTE — ED Notes (Signed)
Cardiac monitor replaced.

## 2022-02-25 NOTE — ED Notes (Signed)
Pt alert and orinented. Total care, does not complain of pain unless moved. Took pills better crushed in apple sauce d/t poor dentition. Pt able to eat soft foods at lunch, Foley patient with dark yellow urine IV infusing per MAR.

## 2022-02-25 NOTE — ED Notes (Signed)
Report to [place], rn.

## 2022-02-25 NOTE — ED Notes (Signed)
Pt cleansed of small amount of incontinent urine, sheets changed.

## 2022-02-25 NOTE — ED Notes (Signed)
Lab here for venipuncture.  

## 2022-02-25 NOTE — Plan of Care (Signed)

## 2022-02-25 NOTE — ED Notes (Signed)
Jeffery Settler, np notified pt will not take po potassium, awaiting further orders.

## 2022-02-25 NOTE — Progress Notes (Signed)
PHARMACIST - PHYSICIAN COMMUNICATION  CONCERNING:  Enoxaparin (Lovenox) for DVT Prophylaxis    RECOMMENDATION: Patient was prescribed enoxaprin 40mg  q24 hours for VTE prophylaxis.   Filed Weights   02/25/22 0735  Weight: 106.1 kg (234 lb)    Body mass index is 32.64 kg/m.  Estimated Creatinine Clearance: 50.9 mL/min (by C-G formula based on SCr of 1.22 mg/dL).   Based on [place] patient is candidate for enoxaparin 0.5mg /kg TBW SQ every 24 hours based on BMI being >30.  DESCRIPTION: Pharmacy has adjusted enoxaparin dose per [facility] policy.  Patient is now receiving enoxaparin 50 mg every 24 hours    Pernell Dupre, PharmD Clinical Pharmacist  02/25/2022 7:42 AM

## 2022-02-25 NOTE — ED Notes (Signed)
Lab notified of need for am venipuncture assist.  

## 2022-02-25 NOTE — ED Notes (Signed)
Pt given morning meds. Tolerated well. Sitting up in bed with lunch.

## 2022-02-25 NOTE — ED Notes (Signed)
After cleansing pt and moving in bed, pt is not contracted to left side, but will not lie on back, is able to straighten legs.

## 2022-02-25 NOTE — ED Notes (Signed)
Pt yelling out in pain, states his legs and buttocks hurt, brenda morrison np notified by secure chat, pt will not take po pills at this time, awaiting orders.

## 2022-02-25 NOTE — ED Notes (Signed)
Pt back from ultrasound. Sitting up eating breakfast with daughter at bedside.

## 2022-02-25 NOTE — Evaluation (Signed)
Physical Therapy Evaluation Patient Details Name: [name] MRN: [id number] DOB: July 19, 1930 Today's Date: 02/25/2022  History of Present Illness  presented to ER from home s/p ground-level fall; admitted for management of AKI, rhabdomyolysis.  Imaging to date negative for acute orthopedic injury.  Clinical Impression  Patient resting in bed upon arrival to session; resting with eyes closed, but awakens to voice and light touch.  Generally lethargic, requiring frequent stimulation/redirection to task, but does follow majority of simple commands (approx 75%).  Oriented to self only; unaware of location, date and reason for admission.  Globally weak and deconditioned throughout all extremities, requiring active assist movement for all ROM to bilat LEs; significant pain indicators with flexion of bilat hips/knees (R > L).  Significant skin issues to bilat LEs (thickening, scaling) and buttocks (bruising, pressure areas) noted.  Currently requiring total assist +2 for any/all rolling and repositioning in bed; limited ability to physically assist, poor tolerance for movement in general.  Unable to tolerate further progression in mobility as result. Would benefit from skilled PT to address above deficits and promote optimal return to PLOF.; recommend transition to STR upon discharge from acute hospitalization.  If patient/family unable to manage care in home environment post-rehab, may consider transition to LTC for long-term care needs.      Recommendations for follow up therapy are one component of a multi-disciplinary discharge planning process, led by the attending physician.  Recommendations may be updated based on patient status, additional functional criteria and insurance authorization.  Follow Up Recommendations Skilled nursing-short term rehab (<3 hours/[place]) Can patient physically be transported by private vehicle: No    Assistance Recommended at Discharge Frequent or constant  Supervision/Assistance  Patient can return home with the following  Two people to help with walking and/or transfers;Two people to help with bathing/dressing/bathroom;Direct supervision/assist for medications management;Direct supervision/assist for financial management;Assist for transportation;Help with stairs or ramp for entrance;Assistance with cooking/housework    Equipment Recommendations    Recommendations for Other Services       Functional Status Assessment Patient has had a recent decline in their functional status and demonstrates the ability to make significant improvements in function in a reasonable and predictable amount of time.     Precautions / Restrictions Precautions Precautions: Fall Restrictions Weight Bearing Restrictions: No      Mobility  Bed Mobility Overal bed mobility: Needs Assistance Bed Mobility: Rolling Rolling: Total assist, +2 for physical assistance         General bed mobility comments: scooting up in bed, dep assist +2    Transfers                   General transfer comment: unsafe/unable    Ambulation/Gait               General Gait Details: unsafe/unable  Stairs            Wheelchair Mobility    Modified Rankin (Stroke Patients Only)       Balance                                             Pertinent Vitals/Pain Pain Assessment Pain Assessment: Faces Faces Pain Scale: Hurts whole lot Pain Location: bilat LEs, buttocks Pain Descriptors / Indicators: Aching, Grimacing, Guarding Pain Intervention(s): Limited activity within patient's tolerance, Monitored during session, Repositioned    Home Living Family/patient  expects to be discharged to:: Private residence Living Arrangements: Children Available Help at Discharge: Family;Available PRN/intermittently Type of Home: Apartment (3rd floor apartment) Home Access: Elevator       Home Layout: One level Home Equipment: Clinical biochemist (2 wheels)      Prior Function Prior Level of Function : Independent/Modified Independent             Mobility Comments: Sup/mod indep with ADLs, limited household mobility; spends majority of time in chair (does not like sleeping in bed).  Gait limited to room/room primarily.       Hand Dominance   Dominant Hand: Right    Extremity/Trunk Assessment   Upper Extremity Assessment Upper Extremity Assessment: Generalized weakness (grossly 3-/5 throughout)    Lower Extremity Assessment Lower Extremity Assessment: Generalized weakness (grossly 2+ to 3-/5 throughout; generally edematous (R > L) with reddened discoloration and significant skin thickening/scaling to bilat LEs (distal to knees))       Communication   Communication: HOH  Cognition Arousal/Alertness: Lethargic Behavior During Therapy: Flat affect Overall Cognitive Status: Impaired/Different from baseline                                 General Comments: Oriented to self; unaware of location, date.  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"        General Comments      Exercises     Assessment/Plan    PT Assessment Patient needs continued PT services  PT Problem List Decreased strength;Decreased range of motion;Decreased activity tolerance;Decreased balance;Decreased mobility;Decreased coordination;Decreased cognition;Decreased knowledge of use of DME;Decreased safety awareness;Decreased knowledge of precautions;Decreased skin integrity;Pain       PT Treatment Interventions DME instruction;Gait training;Functional mobility training;Therapeutic activities;Therapeutic exercise;Balance training;Patient/family education    PT Goals (Current goals can be found in the Care Plan section)  Acute Rehab PT Goals PT Goal Formulation: Patient unable to participate in goal setting Time For Goal Achievement: 03/11/22 Potential to  Achieve Goals: Fair Additional Goals Additional Goal #1: Assess and establish goals for OOB activities/mobility as appropriate.    Frequency Min 2X/week     Co-evaluation PT/OT/SLP Co-Evaluation/Treatment: Yes Reason for Co-Treatment: Complexity of the patient's impairments (multi-system involvement);For patient/therapist safety;To address functional/ADL transfers PT goals addressed during session: Mobility/safety with mobility OT goals addressed during session: ADL's and self-care       AM-PAC PT "6 Clicks" Mobility  Outcome Measure Help needed turning from your back to your side while in a flat bed without using bedrails?: Total Help needed moving from lying on your back to sitting on the side of a flat bed without using bedrails?: Total Help needed moving to and from a bed to a chair (including a wheelchair)?: Total Help needed standing up from a chair using your arms (e.g., wheelchair or bedside chair)?: Total Help needed to walk in hospital room?: Total Help needed climbing 3-5 steps with a railing? : Total 6 Click Score: 6    End of Session   Activity Tolerance: Patient limited by pain Patient left: in bed;with family/visitor present (transport present for transportation to imaging) Nurse Communication: Mobility status PT Visit Diagnosis: Muscle weakness (generalized) (M62.81);Difficulty in walking, not elsewhere classified (R26.2);History of falling (Z91.81)    Time: 7824-2353 PT Time Calculation (min) (ACUTE ONLY): 20 min   Charges:   PT Evaluation $PT Eval Moderate Complexity: 1 Mod  Tejon Gracie H. Owens Shark, PT, DPT, NCS 02/25/22, 11:32 AM [phone]

## 2022-02-25 NOTE — TOC Initial Note (Addendum)
Transition of Care [facility]) - Initial/Assessment Note    Patient Details  Name: [name] MRN: [id number] Date of Birth: August 26, 1930  Transition of Care [facility]) CM/SW Contact:    Coralee Pesa, [place] Phone Number: 02/25/2022, 1:09 PM  Clinical Narrative:                 CSW noted pt is disoriented at this time and spoke with pt's daughter, Modena Nunnery, regarding DC plans. Lea was advised that SNF was recommended, she states she would not like him to go to a facility. Her preference would be for outpatient PT at the hospital or [facility]. They have used [facility] in the past, and are agreeable to using them again. Pt has DME at home including walker, wheelchair, and raised toilet seat.  Per daughter, pt was ambulatory PTA, he needed assistance, but was able to walk with his walker. Per PT pt is now bed bound, CSW updated dtr on pt's new level of care, dtr stating she can care for him at home and can transport him to OP PT. CSW updated medical team to disposition choice. Per RN, pt has blisters and wounds, as well as very long toenails that prevent him wearing socks. He is reportedly unable to move himself in bed and is disoriented.  Pt to be moved inpatient, TOC will continue to follow to determine disposition needs and any safety concerns.  Addendum  Per PT "Given his status documented upon arrival [employer], old food, bed bugs and flies) and multiple skin issues on admission (wounds, blisters on back/buttocks, dried/scaling skin to LEs, thick/unkempt toenails)....I question whether they are able to fully/effectively manage his care needs prior to admission (and especially now)." CSW noting concerns for safe disposition, left a VM for [facility] to make a report. TOC will continue to follow. Expected Discharge Plan: [place] Barriers to Discharge: Continued Medical Work up   Patient Goals and CMS Choice Patient states their goals for this hospitalization and ongoing recovery are:: Pt  disoriented and unable to participate in goal setting. CMS Medicare.gov Compare Post Acute Care list provided to:: Patient Represenative (must comment) Choice offered to / list presented to : Adult Children  Expected Discharge Plan and Services Expected Discharge Plan: [place] Acute Care Choice: Resumption of Svcs/PTA Provider Living arrangements for the past 2 months: Single Family Home                                      Prior Living Arrangements/Services Living arrangements for the past 2 months: Single Family Home Lives with:: Adult Children Patient language and need for interpreter reviewed:: Yes Do you feel safe going back to the place where you live?: Yes      Need for Family Participation in Patient Care: Yes (Comment) Care giver support system in place?: Yes (comment) Current home services: DME, Home PT Criminal Activity/Legal Involvement Pertinent to Current Situation/Hospitalization: No - Comment as needed  Activities of Daily Living Home Assistive Devices/Equipment: Gilford Rile (specify type) ADL Screening (condition at time of admission) Patient's cognitive ability adequate to safely complete daily activities?: Yes Is the patient deaf or have difficulty hearing?: No Does the patient have difficulty seeing, even when wearing glasses/contacts?: No Does the patient have difficulty concentrating, remembering, or making decisions?: No Patient able to express need for assistance with ADLs?: Yes Does the  patient have difficulty dressing or bathing?: Yes Independently performs ADLs?: No Communication: Independent Dressing (OT): Independent Grooming: Independent Feeding: Independent Bathing: Needs assistance Is this a change from baseline?: Pre-admission baseline Toileting: Needs assistance Is this a change from baseline?: Change from baseline, expected to last <3 days In/Out Bed: Needs assistance Is this a change from baseline?: Change  from baseline, expected to last <3 days Walks in Home: Independent with device (comment) Does the patient have difficulty walking or climbing stairs?: Yes Weakness of Legs: Both Weakness of Arms/Hands: None  Permission Sought/Granted Permission sought to share information with : Family Supports Permission granted to share information with : Yes, Verbal Permission Granted  Share Information with NAME: [name]     Permission granted to share info w Relationship: Daughter  Permission granted to share info w Contact Information: [phone]  Emotional Assessment Appearance:: Appears stated age Attitude/Demeanor/Rapport: Unable to Assess Affect (typically observed): Unable to Assess Orientation: : Oriented to Self Alcohol / Substance Use: Not Applicable Psych Involvement: No (comment)  Admission diagnosis:  Acute kidney injury [facility]) [N17.9] Patient Active Problem List   Diagnosis Date Noted   Acute kidney injury (HCC) 02/24/2022   Rhabdomyolysis 02/24/2022   Generalized weakness 02/24/2022   Somnolence 02/24/2022   Sinus arrhythmia 02/24/2022   (HFpEF) heart failure with preserved ejection fraction (HCC) 02/24/2022   Fall 02/24/2022   Right leg swelling 12/03/2019   Hyperbilirubinemia 12/03/2019   Cellulitis 03/17/2018   Acute on chronic heart failure (HCC) 10/19/2017   Poor social situation 09/06/2017   Chronic diastolic heart failure (HCC)    Venous ulcer (HCC) 07/08/2017   Osteoarthritis 05/28/2017   Varicose veins of both lower extremities with pain 04/10/2017   Iron deficiency anemia 11/25/2016   Chronic venous insufficiency 10/04/2016   Lymphedema 06/08/2016   Morbid obesity (HCC) 03/26/2015   Coronary artery disease, non-occlusive 06/05/2013   Hypertension 06/05/2013   Hyperlipidemia 02/23/2012   Diabetes mellitus (HCC) 02/23/2012   Aortic valve stenosis, severe 02/23/2012   PCP:  Glori Luis, MD Pharmacy:   [facility] -  [place], [place] - [address] [place] [address] Phone: [phone] Fax: [phone]  [id number] [place], [place] - [address] AT [facility] OF [address] [place] [address] Phone: [phone] Fax: [phone]     Social Determinants of Health (SDOH) Interventions    Readmission Risk Interventions     No data to display

## 2022-02-25 NOTE — Evaluation (Signed)
Occupational Therapy Evaluation Patient Details Name: [name] MRN: [id number] DOB: May 22, 1930 Today's Date: 02/25/2022   History of Present Illness presented to ER from home s/p ground-level fall; admitted for management of AKI, rhabdomyolysis.  Imaging to date negative for acute orthopedic injury.   Clinical Impression   Patient presenting with decreased independence in self-care functional mobility, safety, and endurance. Patient daughter present and assisting in PLOF and home set-up. She reports the patient does not like to bathe, but will sponge bathe if motivated, has a RW, WC, and BSC. Patient only oriented to self, very lethargic during session, and can follow some simple commands. At baseline, patient is mod I. Patient currently functioning at mod A for UB dressing, total A for LB dressing, total A +2 for rolling and reposition self in bed. Skin breakdown observed on buttocks and LE. Very poor activity tolerance due to increased pain. Unable to complete mobility task. Patient left in bed with daughter present, call bell in reach, and all needs met.  Patient will benefit from acute OT to increase overall independence in the areas of ADLs, functional mobility, in order to safely discharge to the next venue of care.       Recommendations for follow up therapy are one component of a multi-disciplinary discharge planning process, led by the attending physician.  Recommendations may be updated based on patient status, additional functional criteria and insurance authorization.   Follow Up Recommendations  Skilled nursing-short term rehab (<3 hours/[place])    Assistance Recommended at Discharge Frequent or constant Supervision/Assistance  Patient can return home with the following Two people to help with bathing/dressing/bathroom;Two people to help with walking and/or transfers;Help with stairs or ramp for entrance;Assist for transportation;Assistance with cooking/housework;Direct  supervision/assist for financial management;Direct supervision/assist for medications management    Functional Status Assessment  Patient has had a recent decline in their functional status and demonstrates the ability to make significant improvements in function in a reasonable and predictable amount of time.  Equipment Recommendations  Other (comment) (Defer to next venue of care.)    Recommendations for Other Services       Precautions / Restrictions Precautions Precautions: Fall Restrictions Weight Bearing Restrictions: No      Mobility Bed Mobility   Bed Mobility: Rolling Rolling: Total assist, +2 for physical assistance         General bed mobility comments: scooting up in bed, dep assist +2    Transfers                   General transfer comment: unsafe/unable          ADL either performed or assessed with clinical judgement   ADL Overall ADL's : Needs assistance/impaired                 Upper Body Dressing : Moderate assistance   Lower Body Dressing: Total assistance                        Pertinent Vitals/Pain Pain Assessment Pain Assessment: Faces Faces Pain Scale: Hurts whole lot Pain Location: bilat LEs, buttocks Pain Descriptors / Indicators: Aching, Grimacing, Guarding Pain Intervention(s): Limited activity within patient's tolerance, Monitored during session, Repositioned     Hand Dominance Right   Extremity/Trunk Assessment Upper Extremity Assessment Upper Extremity Assessment: Generalized weakness   Lower Extremity Assessment Lower Extremity Assessment: Generalized weakness       Communication Communication Communication: HOH   Cognition Arousal/Alertness: Lethargic Behavior During  Therapy: Flat affect Overall Cognitive Status: Impaired/Different from baseline                                 General Comments: Oriented to self; unaware of location, date.  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"                [place] expects to be discharged to:: Private residence Living Arrangements: Children Available Help at Discharge: Family;Available PRN/intermittently Type of Home: Apartment Home Access: Elevator     Home Layout: One level     Bathroom Shower/Tub: Sponge bathes at baseline (Daughter reports he does not like to bathe.)   Bathroom Toilet: Standard     Home Equipment: Conservation officer, nature (2 wheels);Wheelchair - manual;BSC/3in1          Prior Functioning/Environment Prior Level of Function : Independent/Modified Independent             Mobility Comments: Sup/mod indep with ADLs, limited household mobility; spends majority of time in chair (does not like sleeping in bed).  Gait limited to room/room primarily.          OT Problem List: Decreased strength;Decreased activity tolerance;Decreased safety awareness         OT Goals(Current goals can be found in the care plan section) Acute Rehab OT Goals Patient Stated Goal: to get stronger OT Goal Formulation: With patient Time For Goal Achievement: 03/11/22 Potential to Achieve Goals: Fair ADL Goals Pt Will Perform Lower Body Bathing: with mod assist Pt Will Perform Lower Body Dressing: with mod assist Pt Will Transfer to Toilet: with max assist Pt Will Perform Toileting - Clothing Manipulation and hygiene: with max assist  OT Frequency: Min 2X/week    Co-evaluation PT/OT/SLP Co-Evaluation/Treatment: Yes Reason for Co-Treatment: Complexity of the patient's impairments (multi-system involvement);For patient/therapist safety;To address functional/ADL transfers PT goals addressed during session: Mobility/safety with mobility OT goals addressed during session: ADL's and self-care;Strengthening/ROM      AM-PAC OT "6 Clicks" Daily Activity     Outcome Measure Help from another person eating meals?: None Help  from another person taking care of personal grooming?: A Lot Help from another person toileting, which includes using toliet, bedpan, or urinal?: A Lot Help from another person bathing (including washing, rinsing, drying)?: A Lot Help from another person to put on and taking off regular upper body clothing?: A Little Help from another person to put on and taking off regular lower body clothing?: A Lot 6 Click Score: 15   End of Session Nurse Communication: Mobility status  Activity Tolerance: Patient limited by pain Patient left: in bed;with call bell/phone within reach;with family/visitor present  OT Visit Diagnosis: Repeated falls (R29.6);Muscle weakness (generalized) (M62.81);Unsteadiness on feet (R26.81)                Time: 1478-2956 OT Time Calculation (min): 17 min Charges:  OT General Charges $OT Visit: 1 Visit OT Evaluation $OT Eval Moderate Complexity: [place], OTS 02/25/2022, 12:53 PM

## 2022-02-25 NOTE — ED Notes (Signed)
Pt refuses to leave cardiac monitor and cont pox in place, explanined immportance of need to keep monitoring equipment in place. Placed on pt, pt immediately removed equipment states "get away from me with that shit" when attempting to replace.

## 2022-02-25 NOTE — ED Notes (Signed)
Pt has removed iv, pulled off monitoring equipment and blood pressure, cont pox again. Iv team consult placed.

## 2022-02-25 NOTE — Hospital Course (Addendum)
[name] is a 88 y.o. male with medical history significant of HFpEF, lymphedema, severe aortic stenosis, hypertension, hyperlipidemia, presented to hospital after sustaining a ground-level fall. Patient predominantly sits in his reclining chair at home and often sleeps there as he prefers that over walking to his bed.  Patient was noted to have slumped down the chair and was on the floor.  She was unable to lift him off the floor, and due to this EMS was called.  In the ED, vitals were stable.  Initial lab work remarkable for potassium of 5.2.  CK elevated at 2934.  BNP elevated at 647.  Urinalysis with positive hemoglobin and RBCs, in addition to trace leukocytes and WBCs, however no bacteria seen. CT head obtained with no evidence of acute intracranial abnormality.  CT abdomen pelvis obtained with evidence of edema and skin thickening of the inferior gluteal region and infrarenal abdominal aortic aneurysm measuring 5.3 cm.  Bilateral hip/pelvis x-ray negative.  Chest x-ray with chronic changes but no acute cardiopulmonary disease.  Given given need for PT/OT evaluation and work-up for AKI, [facility] was contacted for admission.  10/14; MRI showed a small acute lacunar infarct in the left parietal white matter and questionable additional punctate infarcts in left occipital pole.  MRA of head and neck with no large vessel occlusion.  A 4 mm left P-comm origin aneurysm. Neurology was consulted. Stroke work-up completed.  Lipid panel mostly unremarkable, LDL of 66, BMP with some improvement of potassium to 3.4, magnesium normal, CK with some improvement to 1027(peaked at 3124), urine cultures negative. PT/OT are recommending SNF. Echocardiogram with normal EF, indeterminate diastolic function, no obvious source of cardiac thrombi or atrial septal shunting.  Also noted to have severe aortic stenosis.  Patient sees Dr. Rockey Situ as an outpatient and most likely is not a candidate for any surgical  intervention. Neurology saw him and he was started on Plavix along with statin.  Allergic to aspirin.  We will continue monitoring him on telemetry to rule out any atrial fibrillation.  Per chart review on arrival to ED patient was very uncapped and roaches and bedbugs were found on him.  APS was notified.   10/15: Patient with overnight agitated, this morning he pulled his IV and telemetry wires and does not let them replaced. CK normalized this morning and BMP without any significant abnormality today.   Patient is currently full code.  High risk for deterioration and mortality based on age, advanced dementia and underlying comorbidities.  Palliative care was consulted. Neurology is recommending ZIO monitor placement before discharge.

## 2022-02-25 NOTE — ED Notes (Signed)
Pt remains arousable to voice states no needs

## 2022-02-26 ENCOUNTER — Inpatient Hospital Stay (HOSPITAL_COMMUNITY)
Admit: 2022-02-26 | Discharge: 2022-02-26 | Disposition: A | Discharge status: Home / Self Care | Payer: Medicare Other | Attending: Internal Medicine | Admitting: Internal Medicine

## 2022-02-26 DIAGNOSIS — I5032 Chronic diastolic (congestive) heart failure: Secondary | ICD-10-CM | POA: Diagnosis not present

## 2022-02-26 DIAGNOSIS — R531 Weakness: Secondary | ICD-10-CM | POA: Diagnosis not present

## 2022-02-26 DIAGNOSIS — L8990 Pressure ulcer of unspecified site, unspecified stage: Secondary | ICD-10-CM | POA: Insufficient documentation

## 2022-02-26 DIAGNOSIS — N179 Acute kidney failure, unspecified: Secondary | ICD-10-CM | POA: Diagnosis not present

## 2022-02-26 DIAGNOSIS — W19XXXA Unspecified fall, initial encounter: Secondary | ICD-10-CM | POA: Diagnosis not present

## 2022-02-26 DIAGNOSIS — I350 Nonrheumatic aortic (valve) stenosis: Secondary | ICD-10-CM | POA: Diagnosis not present

## 2022-02-26 DIAGNOSIS — I639 Cerebral infarction, unspecified: Secondary | ICD-10-CM

## 2022-02-26 DIAGNOSIS — I6381 Other cerebral infarction due to occlusion or stenosis of small artery: Secondary | ICD-10-CM | POA: Diagnosis present

## 2022-02-26 DIAGNOSIS — R400 Somnolence: Secondary | ICD-10-CM | POA: Diagnosis not present

## 2022-02-26 LAB — BASIC METABOLIC PANEL
Anion gap: 6 (ref 5–15)
BUN: 24 mg/dL — ABNORMAL HIGH (ref 8–23)
CO2: 27 mmol/L (ref 22–32)
Calcium: 7.7 mg/dL — ABNORMAL LOW (ref 8.9–10.3)
Chloride: 110 mmol/L (ref 98–111)
Creatinine, Ser: 0.89 mg/dL (ref 0.61–1.24)
GFR, Estimated: >60 mL/min (ref >60)
Glucose, Bld: 85 mg/dL (ref 70–99)
Potassium: 3.1 mmol/L — ABNORMAL LOW (ref 3.5–5.1)
Sodium: 143 mmol/L (ref 135–145)

## 2022-02-26 LAB — MAGNESIUM: Magnesium: 2.2 mg/dL (ref 1.7–2.4)

## 2022-02-26 LAB — ECHOCARDIOGRAM COMPLETE
AR max vel: 0.94 cm2
AV Area VTI: 0.89 cm2
AV Area mean vel: 0.92 cm2
AV Mean grad: 39.7 mmHg
AV Peak grad: 73.4 mmHg
Ao pk vel: 4.28 m/s
Area-P 1/2: 5.34 cm2
Height: 71 in
S' Lateral: 3.4 cm
Weight: 3744 oz

## 2022-02-26 LAB — LIPID PANEL
Cholesterol: 98 mg/dL (ref 0–200)
HDL: 22 mg/dL — ABNORMAL LOW (ref >40)
LDL Cholesterol: 66 mg/dL (ref 0–99)
Total CHOL/HDL Ratio: 4.5 RATIO
Triglycerides: 52 mg/dL (ref <150)
VLDL: 10 mg/dL (ref 0–40)

## 2022-02-26 LAB — CBC
HCT: 36.7 % — ABNORMAL LOW (ref 39.0–52.0)
Hemoglobin: 11.9 g/dL — ABNORMAL LOW (ref 13.0–17.0)
MCH: 27.7 pg (ref 26.0–34.0)
MCHC: 32.4 g/dL (ref 30.0–36.0)
MCV: 85.3 fL (ref 80.0–100.0)
Platelets: 132 10*3/uL — ABNORMAL LOW (ref 150–400)
RBC: 4.3 MIL/uL (ref 4.22–5.81)
RDW: 15.6 % — ABNORMAL HIGH (ref 11.5–15.5)
WBC: 6 10*3/uL (ref 4.0–10.5)
nRBC: 0 % (ref 0.0–0.2)

## 2022-02-26 LAB — URINE CULTURE: Culture: NO GROWTH

## 2022-02-26 LAB — CK: Total CK: 1027 U/L — ABNORMAL HIGH (ref 49–397)

## 2022-02-26 MED ORDER — POTASSIUM CHLORIDE 10 MEQ/100ML IV SOLN
10.0000 meq | INTRAVENOUS | Status: AC
  Administered 2022-02-26 (×4): 10 meq via INTRAVENOUS
  Filled 2022-02-26 (×4): qty 100

## 2022-02-26 MED ORDER — POTASSIUM CHLORIDE CRYS ER 20 MEQ PO TBCR
40.0000 meq | EXTENDED_RELEASE_TABLET | Freq: Once | ORAL | Status: AC
  Administered 2022-02-26: 40 meq via ORAL
  Filled 2022-02-26: qty 2

## 2022-02-26 NOTE — Progress Notes (Addendum)
PROGRESS NOTE    [name]  BZJ:[id number] DOB: 1930-10-04 DOA: 02/24/2022 PCP: Leone Haven, MD    Brief Narrative:  [name] is a 88 y.o. male with medical history significant of HFpEF, lymphedema, severe aortic stenosis, hypertension, hyperlipidemia, presented to the hospital after sustaining a ground-level fall. Patient predominantly sits in his reclining chair at home and often sleeps there as he prefers that over walking to his bed.  Patient was noted to have slumped down the chair and was on the floor.  She was unable to lift him off the floor, and due to this EMS was called.  In the ED, vitals were stable.  Initial lab work remarkable for potassium of 5.2.  CK elevated at 2934.  BNP elevated at 647.  Urinalysis with positive hemoglobin and RBCs, in addition to trace leukocytes and WBCs, however no bacteria seen. CT head obtained with no evidence of acute intracranial abnormality.  CT abdomen pelvis obtained with evidence of edema and skin thickening of the inferior gluteal region and infrarenal abdominal aortic aneurysm measuring 5.3 cm.  Bilateral hip/pelvis x-ray negative.  Chest x-ray with chronic changes but no acute cardiopulmonary disease.  Given need for PT/OT evaluation and work-up for AKI, patient was admitted to the hospital.  Assessment and plan  Principal Problem:   Fall Active Problems:   Acute kidney injury ([place])   Rhabdomyolysis   Somnolence   Sinus arrhythmia   Aortic valve stenosis, severe   (HFpEF) heart failure with preserved ejection fraction (HCC)   Lymphedema   Generalized weakness   Diabetes mellitus ([place])   Coronary artery disease, non-occlusive   Hypertension   Sepsis ([place])   Pressure injury of skin   Lacunar stroke ([place])   Fall From recliner to the floor.  Cannot rule out syncope due to history of aortic stenosis.    EKG was unremarkable.  2 D echocardiogram ejection fraction of 55 to 60% with no regional wall motion abnormality MRI of the  brain showed small acute lacunar infarct in the left periatrial white matter.  Continue Plavix.  Spoke with neurology on-call on 02/25/2022.  Follow neurology recommendations.  Acute lacunar stroke.  Noted on the MRI.  On Plavix will continue.  Neurology notified.  PT recommends skilled nursing facility placement.  2D echocardiogram with preserved LV function.  We will follow nephrology recommendation.  Hemoglobin A1c of 4.7.  Lipid panel with HDL at 22 LDL 66.  EKG with irregular rhythm likely A-fib.  Neurology aware.  Neurology recommending MR angiogram of the neck and head.  Acute kidney injury  Creatinine on presentation elevated at 1.54.  Creatinine has improved to 0.8 at this time.  Patient was recently started on torsemide last week which is on hold at this time.  Renal ultrasound shows no hydronephrosis and bilateral echogenic kidneys with benign cysts.   Rhabdomyolysis Patient presented with elevated CK at 2934.  Received hydration with improvement in CK levels.  CK level today at 1027.  Hypokalemia.  Potassium of 2.9 on 02/25/2022.  Aggressively replenished but still at 3.1.  We will continue to replenish through 40 mEq of IV and 40 of p.o.  Check levels in AM.  Somnolence likely metabolic encephalopathy. At baseline patient is fully oriented.,  He is more alert awake and communicative today.  Oriented to place and person   Sinus arrhythmia Continue telemetry monitor.  Metoprolol on hold due to intermittent bradycardia.  Restart metoprolol if resolution of bradycardia.   Severe aortic valve  stenosis. No repeat echo since 2019.  2D echocardiogram this time shows preserved LV function.  Severe aortic stenosis noted   (HFpEF) heart failure with preserved ejection fraction (HCC) Lower extremity edema likely secondary to lymphedema.  BNP is elevated at 640, however it has not been evaluated for 4 years and this may be his chronic elevation at this time.  2D echocardiogram with valve  stenosis but LV function is preserved..  Torsemide and metoprolol on hold at this time.  Lymphedema Consider Unna boots  Diabetes mellitus ([place]) Not on medications.  Latest hemoglobin A1c of 4.7. Diet controlled.   Coronary artery disease, non-occlusive Continue Plavix   Hypertension Metoprolol on hold at this time due to bradycardia..  Latest blood pressure of 121/54.   Generalized weakness, debility, deconditioning, physical decline, skin breakdown over the buttocks and lower extremity, PT has recommended skilled nursing facility.  TOC on board for safe disposition.  As per TOC, APS to be notified.     DVT prophylaxis:    Lovenox subcu  Code Status:     Code Status: Full Code  Disposition:  Skilled nursing facility as per PT recommendation.  Patient lives with his sister at home.  Status is: Inpatient.  The patient is inpatient because: Need for rehabilitation, rhabdomyolysis, hypokalemia, neurology consultation   Family Communication:  Spoke with the patient's brother Mr. [name] on the phone and updated him about the clinical condition of the patient.  He states that patient lives with Mariann Laster, stepdaughter at home.  I informed him about the skilled nursing home and he was okay with that.  Consultants:  None  Procedures:  None  Antimicrobials:  None  Anti-infectives (From admission, onward)    None       Subjective: Today, patient was seen and examined at bedside.  History weak but more alert and awake was able to tell me where he was.  Denies any pain, nausea vomiting but poor historian.    Objective: Vitals:   02/25/22 1605 02/25/22 1646 02/25/22 2214 02/26/22 0740  BP: (!) 127/53 (!) 112/49 (!) 114/38 (!) 121/54  Pulse: 62 (!) 42 94 (!) 40  Resp:  19 20 18   Temp:  97.7 F (36.5 C) 98.6 F (37 C) 98.8 F (37.1 C)  TempSrc:  Oral  Oral  SpO2:  94% 94% 98%  Weight:      Height:        Intake/Output Summary (Last 24 hours) at 02/26/2022 1347 Last  data filed at 02/26/2022 1032 Gross per 24 hour  Intake 688.08 ml  Output 800 ml  Net -111.92 ml   Filed Weights   02/25/22 0735  Weight: 106.1 kg    Physical Examination: Body mass index is 32.64 kg/m.   General: Obese built, not in obvious distress, on room air, elderly male HENT:   No scleral pallor or icterus noted. Oral mucosa is moist.  Chest:  Clear breath sounds.  Diminished breath sounds bilaterally. No crackles or wheezes.  CVS: S1 &S2 heard.  Systolic murmur, irregular rhythm.  Abdomen: Soft, nontender, nondistended.  Bowel sounds are heard.   Extremities: No cyanosis, clubbing with bilateral lower extremity edema, hyperpigmentation suggestive of lymphedema peripheral pulses are palpable. Psych: Alert, awake and oriented to place, Communicative, normal mood CNS:  No cranial nerve deficits.  Moving extremities, Skin: Warm and dry.  Skin with tears, wounds blisters on back, thick unkempt toe nails.  Data Reviewed:   CBC: Recent Labs  Lab 02/24/22 1730 02/25/22 [id number]  02/26/22 0618  WBC 10.2 7.3 6.0  NEUTROABS 8.4* 5.5  --   HGB 14.3 13.1 11.9*  HCT 43.6 39.3 36.7*  MCV 84.2 84.7 85.3  PLT 156 118* 132*    Basic Metabolic Panel: Recent Labs  Lab 02/24/22 1510 02/25/22 0220 02/25/22 0350 02/26/22 0618  NA 142 144  --  143  K 4.1 2.9*  --  3.1*  CL 101 107  --  110  CO2 27 28  --  27  GLUCOSE 112* 91  --  85  BUN 36* 36*  --  24*  CREATININE 1.54* 1.22  --  0.89  CALCIUM 8.8* 8.3*  --  7.7*  MG  --   --  2.3 2.2    Liver Function Tests: Recent Labs  Lab 02/24/22 1510 02/25/22 0220  AST 124* 120*  ALT 30 36  ALKPHOS 68 64  BILITOT 3.7* 2.8*  PROT 6.3* 5.6*  ALBUMIN 3.0* 2.6*     Radiology Studies: ECHOCARDIOGRAM COMPLETE  Result Date: 02/26/2022    ECHOCARDIOGRAM REPORT   Patient Name:   [name] Date of Exam: 02/26/2022 Medical Rec #:  [id number]  Height:       71.0 in Accession #:    [id number] Weight:       234.0 lb Date of Birth:   1931/01/09  BSA:          2.254 m Patient Age:    77 years   BP:           121/54 mmHg Patient Gender: M          HR:           40 bpm. Exam Location:  [facility] Procedure: 2D Echo, Cardiac Doppler and Color Doppler Indications:     Aortic stenosis I35.0  History:         Patient has prior history of Echocardiogram examinations, most                  recent 03/18/2018. Signs/Symptoms:Murmur; Risk                  Factors:Hypertension. Severe Aortic stenosis.  Sonographer:     Sherrie Sport Referring Phys:  [id number] Jose Persia Diagnosing Phys: Ida Rogue MD IMPRESSIONS  1. Left ventricular ejection fraction, by estimation, is 55 to 60%. The left ventricle has normal function. The left ventricle has no regional wall motion abnormalities. There is moderate left ventricular hypertrophy. Left ventricular diastolic parameters are indeterminate.  2. Right ventricular systolic function is normal. The right ventricular size is normal. There is normal pulmonary artery systolic pressure. The estimated right ventricular systolic pressure is [id number] mmHg.  3. Left atrial size was mildly dilated.  4. Moderate pleural effusion in the left lateral region.  5. The mitral valve is normal in structure. Moderate mitral valve regurgitation. No evidence of mitral stenosis.  6. The aortic valve is normal in structure. There is severe calcifcation of the aortic valve. Aortic valve regurgitation is moderate. Severe aortic valve stenosis. Aortic valve area, by VTI measures 0.89 cm. Aortic valve mean gradient measures 39.7 mmHg. Aortic valve Vmax measures 4.28 m/s.  7. The inferior vena cava is normal in size with greater than 50% respiratory variability, suggesting right atrial pressure of 3 mmHg. FINDINGS  Left Ventricle: Left ventricular ejection fraction, by estimation, is 55 to 60%. The left ventricle has normal function. The left ventricle has no regional wall motion abnormalities. The left ventricular internal cavity size was  normal in  size. There is  moderate left ventricular hypertrophy. Left ventricular diastolic parameters are indeterminate. Right Ventricle: The right ventricular size is normal. No increase in right ventricular wall thickness. Right ventricular systolic function is normal. There is normal pulmonary artery systolic pressure. The tricuspid regurgitant velocity is 2.74 m/s, and  with an assumed right atrial pressure of 5 mmHg, the estimated right ventricular systolic pressure is [id number] mmHg. Left Atrium: Left atrial size was mildly dilated. Right Atrium: Right atrial size was normal in size. Pericardium: There is no evidence of pericardial effusion. Mitral Valve: The mitral valve is normal in structure. There is mild calcification of the mitral valve leaflet(s). Moderate mitral valve regurgitation. No evidence of mitral valve stenosis. Tricuspid Valve: The tricuspid valve is normal in structure. Tricuspid valve regurgitation is not demonstrated. No evidence of tricuspid stenosis. Aortic Valve: The aortic valve is normal in structure. There is severe calcifcation of the aortic valve. Aortic valve regurgitation is moderate. Severe aortic stenosis is present. Aortic valve mean gradient measures 39.7 mmHg. Aortic valve peak gradient measures 73.4 mmHg. Aortic valve area, by VTI measures 0.89 cm. Pulmonic Valve: The pulmonic valve was normal in structure. Pulmonic valve regurgitation is not visualized. No evidence of pulmonic stenosis. Aorta: The aortic root is normal in size and structure. Venous: The inferior vena cava is normal in size with greater than 50% respiratory variability, suggesting right atrial pressure of 3 mmHg. IAS/Shunts: No atrial level shunt detected by color flow Doppler. Additional Comments: There is a moderate pleural effusion in the left lateral region.  LEFT VENTRICLE PLAX 2D LVIDd:         5.30 cm LVIDs:         3.40 cm LV PW:         1.20 cm LV IVS:        1.60 cm LVOT diam:     2.00 cm LV SV:         84 LV  SV Index:   37 LVOT Area:     3.14 cm  RIGHT VENTRICLE RV Basal diam:  5.00 cm RV Mid diam:    5.00 cm RV S prime:     16.40 cm/s TAPSE (M-mode): 3.9 cm LEFT ATRIUM             Index        RIGHT ATRIUM           Index LA diam:        4.40 cm 1.95 cm/m   RA Area:     20.60 cm LA Vol (A2C):   47.1 ml 20.90 ml/m  RA Volume:   54.90 ml  24.36 ml/m LA Vol (A4C):   34.5 ml 15.31 ml/m LA Biplane Vol: 41.0 ml 18.19 ml/m  AORTIC VALVE AV Area (Vmax):    0.94 cm AV Area (Vmean):   0.92 cm AV Area (VTI):     0.89 cm AV Vmax:           428.33 cm/s AV Vmean:          290.333 cm/s AV VTI:            0.943 m AV Peak Grad:      73.4 mmHg AV Mean Grad:      39.7 mmHg LVOT Vmax:         128.00 cm/s LVOT Vmean:        85.300 cm/s LVOT VTI:          0.268  m LVOT/AV VTI ratio: 0.28  AORTA Ao Root diam: 3.50 cm MITRAL VALVE                TRICUSPID VALVE MV Area (PHT): 5.34 cm     TR Peak grad:   30.0 mmHg MV Decel Time: 142 msec     TR Vmax:        274.00 cm/s MV E velocity: 113.00 cm/s                             SHUNTS                             Systemic VTI:  0.27 m                             Systemic Diam: 2.00 cm Ida Rogue MD Electronically signed by Ida Rogue MD Signature Date/Time: 02/26/2022/10:11:47 AM    Final    MR BRAIN WO CONTRAST  Result Date: 02/25/2022 CLINICAL DATA:  88 year old male with altered mental status.  Fall. EXAM: MRI HEAD WITHOUT CONTRAST TECHNIQUE: Multiplanar, multiecho pulse sequences of the brain and surrounding structures were obtained without intravenous contrast. COMPARISON:  Head CT yesterday. FINDINGS: Study is mildly degraded by motion artifact despite repeated imaging attempts. Brain: Small linear restricted diffusion in the left periatrial white matter series 5, image 25. Mild acute and underlying patchy chronic white matter T2 and FLAIR hyperintensity there. Possible tiny additional focus of restricted diffusion in the left occipital pole versus artifact (series 5,  image 17). Mild for age other mostly periventricular white matter T2 and FLAIR hyperintensity. Mild T2 heterogeneity in the bilateral basal ganglia. Thalami, brainstem and cerebellum appear negative. No cortical encephalomalacia identified. No other restricted diffusion. No midline shift, mass effect, evidence of mass lesion, ventriculomegaly, extra-axial collection or acute intracranial hemorrhage. Cervicomedullary junction and pituitary are within normal limits. No chronic cerebral blood products. Vascular: Major intracranial vascular flow voids are preserved. Intracranial artery tortuosity. Distal left vertebral artery appears dominant. Skull and upper cervical spine: Negative for age visible cervical spine. Visualized bone marrow signal is within normal limits. Sinuses/Orbits: Negative orbits. Previous paranasal sinus surgery with residual sinus mucosal thickening and trace fluid. Other: Mastoid air cells are well aerated. Grossly negative visible internal auditory structures. Negative visible scalp and face. IMPRESSION: 1. Small acute lacunar infarct in the left periatrial white matter. And questionable additional punctate infarct in the left occipital pole. No associated hemorrhage or mass effect. 2. Underlying mild for age chronic small vessel disease. Electronically Signed   By: Genevie Ann M.D.   On: 02/25/2022 11:10   US RENAL  Result Date: 02/25/2022 CLINICAL DATA:  Acute kidney injury EXAM: RENAL / URINARY TRACT ULTRASOUND COMPLETE COMPARISON:  02/24/2022 CT abdomen/pelvis. FINDINGS: Right Kidney: Renal measurements: 10.7 x 6.2 x 6.2 cm = volume: 215 mL. No hydronephrosis. Minimally complex 3.0 x 2.0 x 2.4 cm lower right renal cyst with thin internal septation. Exophytic simple 3.1 x 2.7 x 2.8 cm lateral interpolar right renal cyst and simple 3.6 x 2.3 x 2.3 cm lower right renal cyst. Background renal parenchyma is echogenic and normal thickness. Left Kidney: Renal measurements: 10.6 x 6.1 x 5.1 cm =  volume: 174 mL. No hydronephrosis. Simple 2.4 x 1.6 x 2.1 cm upper left renal cyst. Exophytic simple 1.2 x 1.2 x 1.0  cm upper left renal cyst. Simple parapelvic lower left renal 1.6 cm cyst. Background renal parenchyma is echogenic and normal thickness. Bladder: Collapsed by indwelling Foley catheter, precluding assessment. Other: None. IMPRESSION: 1. No hydronephrosis. 2. Echogenic normal size kidneys bilaterally, compatible with nonspecific acute renal parenchymal disease. 3. Benign appearing bilateral renal cysts, requiring no further imaging follow-up. 4. Bladder is collapsed by indwelling Foley catheter, precluding assessment. Electronically Signed   By: Ilona Sorrel M.D.   On: 02/25/2022 09:16   CT ABDOMEN PELVIS WO CONTRAST  Result Date: 02/24/2022 CLINICAL DATA:  Fall pain EXAM: CT ABDOMEN AND PELVIS WITHOUT CONTRAST TECHNIQUE: Multidetector CT imaging of the abdomen and pelvis was performed following the standard protocol without IV contrast. RADIATION DOSE REDUCTION: This exam was performed according to the departmental dose-optimization program which includes automated exposure control, adjustment of the mA and/or kV according to patient size and/or use of iterative reconstruction technique. COMPARISON:  Ultrasound 02/14/2020, CT report 09/21/2023 FINDINGS: Lower chest: Lung bases demonstrate no acute airspace disease. Coronary vascular calcification. Cardiomegaly. Hepatobiliary: No focal liver abnormality is seen. Status post cholecystectomy. No biliary dilatation. Pancreas: Unremarkable. No pancreatic ductal dilatation or surrounding inflammatory changes. Spleen: Normal in size without focal abnormality. Adrenals/Urinary Tract: Adrenal glands are normal. Multiple low-density lesions within the bilateral kidneys are likely cysts but limited assessment without contrast, no specific imaging follow-up is recommended. No hydronephrosis. The bladder is unremarkable Stomach/Bowel: Stomach is within normal  limits. Appendix appears normal. No evidence of bowel wall thickening, distention, or inflammatory changes. Vascular/Lymphatic: Moderate aortic atherosclerosis. Aneurysmal dilatation of the distal infrarenal abdominal aorta at the bifurcation, this measures 5.3 cm. Aneurysmal dilatation of the left greater than right common iliac arteries. Left common iliac artery measures 4.6 cm, right common iliac artery measures 2.4 cm. No evidence for stranding or retroperitoneal hematoma. 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 Diffusely ectatic right internal iliac artery. Reproductive: Heterogeneous prostate calcification without discrete mass Other: Negative for pelvic effusion or free air. Musculoskeletal: Edema and skin thickening within the inferior gluteal region and posterior aspects of the upper thighs with soft tissue infiltration. Ankylosis across the L5-S1 disc space. No definite acute osseous abnormality IMPRESSION: 1. No CT evidence for acute intra-abdominal or pelvic abnormality. 2. Edema and skin thickening within the inferior gluteal region and upper aspects of the thighs posteriorly, this could be secondary to dependent edema, trauma, or infection, correlate with direct inspection 3. Infrarenal abdominal aortic aneurysm measuring 5.3 cm. Recommend follow-up CT/MR every 6 months and vascular consultation. This recommendation follows ACR consensus guidelines: White Paper of the ACR Incidental Findings Committee II on Vascular Findings. J Am Coll Radiol 2013; 10:789-794. aneurysmal dilatation of the bilateral common iliac arteries, left greater than right with aneurysm of left internal iliac artery. Negative for Peri aortic stranding or retroperitoneal hematoma Electronically Signed   By: Donavan Foil M.D.   On: 02/24/2022 17:02   DG Chest 1 View  Result Date: 02/24/2022 CLINICAL DATA:  Weakness and leg swelling. EXAM: CHEST  1 VIEW COMPARISON:  Chest radiograph 03/17/2018 FINDINGS:  Chronic but progressive elevation of right hemidiaphragm. The heart is enlarged. No focal airspace disease, pulmonary edema, pleural effusion or pneumothorax. Gaseous distention of bowel loops in the upper abdomen is partially visualized. IMPRESSION: 1. Chronic but progressive elevation of right hemidiaphragm. 2. Cardiomegaly. 3. Partially visualized gaseous distention of bowel loops in the upper abdomen, nonspecific. Electronically Signed   By: Keith Rake M.D.   On: 02/24/2022 16:24  DG Hip Unilat W or Wo Pelvis 2-3 Views Right  Result Date: 02/24/2022 CLINICAL DATA:  Weakness with leg swelling. EXAM: DG HIP (WITH OR WITHOUT PELVIS) 2-3V RIGHT COMPARISON:  None Available. FINDINGS: No acute fracture. No dislocation. Mild symmetric bilateral hip joint space narrowing with acetabular spurring. No erosions, avascular necrosis or evidence of focal bone lesion. No bony destructive change. Intact pubic rami. Pubic symphysis and sacroiliac joints are congruent. No focal soft tissue abnormalities are seen. IMPRESSION: No acute findings.  Mild symmetric bilateral hip osteoarthritis. Electronically Signed   By: Keith Rake M.D.   On: 02/24/2022 16:22   US Venous Img Lower Bilateral  Result Date: 02/24/2022 CLINICAL DATA:  Bilateral lower extremity pain and edema for the past 4 years. Evaluate for DVT. EXAM: BILATERAL LOWER EXTREMITY VENOUS DOPPLER ULTRASOUND TECHNIQUE: Gray-scale sonography with graded compression, as well as color Doppler and duplex ultrasound were performed to evaluate the lower extremity deep venous systems from the level of the common femoral vein and including the common femoral, femoral, profunda femoral, popliteal and calf veins including the posterior tibial, peroneal and gastrocnemius veins when visible. The superficial great saphenous vein was also interrogated. Spectral Doppler was utilized to evaluate flow at rest and with distal augmentation maneuvers in the common femoral,  femoral and popliteal veins. COMPARISON:  Right lower extremity venous Doppler ultrasound-12/03/2019 (negative); 12/19/2015 (negative; 05/08/2015 (negative); 03/26/2015 (negative); 06/01/2008 (negative) Bilateral lower extremity venous Doppler ultrasound-03/17/2018 (negative); 08/06/2017 (negative) FINDINGS: Examination is degraded due to patient body habitus and poor sonographic window RIGHT LOWER EXTREMITY Common Femoral Vein: No evidence of thrombus. Normal compressibility, respiratory phasicity and response to augmentation. Saphenofemoral Junction: No evidence of thrombus. Normal compressibility and flow on color Doppler imaging. Profunda Femoral Vein: No evidence of thrombus. Normal compressibility and flow on color Doppler imaging. Femoral Vein: No evidence of thrombus. Normal compressibility, respiratory phasicity and response to augmentation. Popliteal Vein: No evidence of thrombus. Normal compressibility, respiratory phasicity and response to augmentation. Calf Veins: Appear patent where visualized. Superficial Great Saphenous Vein: No evidence of thrombus. Normal compressibility. Other Findings: There is a minimal amount of subcutaneous edema at the level of the right calf. LEFT LOWER EXTREMITY Common Femoral Vein: No evidence of thrombus. Normal compressibility, respiratory phasicity and response to augmentation. Saphenofemoral Junction: No evidence of thrombus. Normal compressibility and flow on color Doppler imaging. Profunda Femoral Vein: No evidence of thrombus. Normal compressibility and flow on color Doppler imaging. Femoral Vein: No evidence of thrombus. Normal compressibility, respiratory phasicity and response to augmentation. Popliteal Vein: No evidence of thrombus. Normal compressibility, respiratory phasicity and response to augmentation. Calf Veins: Appear patent where visualized. Superficial Great Saphenous Vein: No evidence of thrombus. Normal compressibility. Other Findings:  None.  IMPRESSION: No evidence of DVT within either lower extremity. Electronically Signed   By: Sandi Mariscal M.D.   On: 02/24/2022 16:10   CT Head Wo Contrast  Result Date: 02/24/2022 CLINICAL DATA:  Altered mental status. EXAM: CT HEAD WITHOUT CONTRAST TECHNIQUE: Contiguous axial images were obtained from the base of the skull through the vertex without intravenous contrast. RADIATION DOSE REDUCTION: This exam was performed according to the departmental dose-optimization program which includes automated exposure control, adjustment of the mA and/or kV according to patient size and/or use of iterative reconstruction technique. COMPARISON:  Head CT 08/05/2017 FINDINGS: Brain: No evidence of acute infarction, hemorrhage, hydrocephalus, extra-axial collection or mass lesion/mass effect. Age related atrophy. Periventricular chronic small vessel ischemia with slight progression. Vascular: Atherosclerosis of skullbase vasculature  without hyperdense vessel or abnormal calcification. Skull: No fracture or focal lesion. Sinuses/Orbits: Postsurgical change of the paranasal sinuses. No mastoid effusion. Other: Mild right-sided scalp edema. No confluent hematoma. IMPRESSION: 1. No acute intracranial abnormality. 2. Age related atrophy and chronic small vessel ischemia. Electronically Signed   By: Keith Rake M.D.   On: 02/24/2022 15:11      LOS: 1 [place]    Flora Lipps, MD [facility] Available via Epic secure chat 7am-7pm After these hours, please refer to coverage provider listed on [email or web address] 02/26/2022, 1:47 PM

## 2022-02-26 NOTE — Progress Notes (Signed)
Physical Therapy Treatment Patient Details Name: [name] MRN: [id number] DOB: [birth year] Today's Date: 02/26/2022   History of Present Illness presented to ER from home s/p ground-level fall; admitted for management of AKI, rhabdomyolysis.  Imaging to date negative for acute orthopedic injury.    PT Comments    Pt tolerated treatment fair. PT to assist CNA with bath, wound care, and linen change. Pt overall limited with participation and further mobility secondary to "all over" pain and confusion, limiting ability to consistently following commands. Pt continues to require total assist +1-2 for multiple rolling attempts in supine, and for maintenance of sidelying position during hygiene and wound care. Barrier cream and mepilex applied to the appropriate areas to prevent further skin breakdown. Increased resistance noted with bed mobility and unable to tolerate AAROM with ankle pumps secondary to increased pain levels. Will continue to see pt per POC. Will continue to heavily recommend SNF at DC, to address deficits for improved Ind with mobility prior to return home.  Concern for DC home, as pt has significant skin breakdown (see general comments below), is very confused, and requires total assist +1-2 for bed mobility due to generalized weakness and pain. If pt were to DC home, he would require +2 assist for all mobility, hoyer lift for OOB mobility, and a hospital bed. Pt would likely not be able to leave the home for OPPT, so would recommend HHPT.     Recommendations for follow up therapy are one component of a multi-disciplinary discharge planning process, led by the attending physician.  Recommendations may be updated based on patient status, additional functional criteria and insurance authorization.  Follow Up Recommendations  Skilled nursing-short term rehab (<3 hours/[place]) Can patient physically be transported by private vehicle: No   Assistance Recommended at Discharge Frequent or  constant Supervision/Assistance  Patient can return home with the following Two people to help with walking and/or transfers;Two people to help with bathing/dressing/bathroom;Direct supervision/assist for medications management;Direct supervision/assist for financial management;Assist for transportation;Help with stairs or ramp for entrance;Assistance with cooking/housework   Equipment Recommendations  Hospital bed (hoyer lift)       Precautions / Restrictions Precautions Precautions: Fall Restrictions Weight Bearing Restrictions: No     Mobility  Bed Mobility   Bed Mobility: Rolling Rolling: Total assist, +2 for physical assistance         General bed mobility comments: +1-2 assist for rolling bilaterally multiple times; required total assist for maintenance of sidelying position. Limited secondary to increased "all over" pain    Transfers                   General transfer comment: unsafe/unable         Balance       Sitting balance - Comments: unable to assess; not safe for transfers at this time                                    Cognition Arousal/Alertness: Lethargic Behavior During Therapy: Flat affect Overall Cognitive Status: Impaired/Different from baseline                                 General Comments: Oriented to self; unaware of location, date.  able to follow ~50% of 1-step commands        Exercises Other Exercises Other Exercises: Participates in bil rolling  x multiple reps with assist from aide for bath and linen change. Increased assist and cueing required secondary to all over pain. Attempted to initiate ankle pumps in supine; pt able to demonstrate toe flex/ext, but unable to tolerate active-assist ankle pumps secondary to increased pain. Other Exercises: Pt educated re: PT role/POC, DC recommendations, benefits of participation, skin integrity.    General Comments General comments (skin integrity,  edema, etc.): skin breakdown noted under R pannus, bil inner thigh/hip crease, scrotum, and buttock. Applied barrier cream and mepilex. RN notified.      Pertinent Vitals/Pain Pain Assessment Pain Assessment: Faces Faces Pain Scale: Hurts whole lot Pain Location: bilat LEs, buttocks; "all over" Pain Descriptors / Indicators: Aching, Grimacing, Guarding, Moaning Pain Intervention(s): Monitored during session, Repositioned, Limited activity within patient's tolerance     PT Goals (current goals can now be found in the care plan section) Acute Rehab PT Goals PT Goal Formulation: Patient unable to participate in goal setting Time For Goal Achievement: 03/11/22 Potential to Achieve Goals: Fair Additional Goals Additional Goal #1: Assess and establish goals for OOB activities/mobility as appropriate.    Frequency    Min 2X/week      PT Plan Current plan remains appropriate       AM-PAC PT "6 Clicks" Mobility   Outcome Measure  Help needed turning from your back to your side while in a flat bed without using bedrails?: Total Help needed moving from lying on your back to sitting on the side of a flat bed without using bedrails?: Total Help needed moving to and from a bed to a chair (including a wheelchair)?: Total Help needed standing up from a chair using your arms (e.g., wheelchair or bedside chair)?: Total Help needed to walk in hospital room?: Total Help needed climbing 3-5 steps with a railing? : Total 6 Click Score: 6    End of Session   Activity Tolerance: Patient limited by pain Patient left: in bed;with call bell/phone within reach;with bed alarm set Nurse Communication: Mobility status (skin integrity) PT Visit Diagnosis: Muscle weakness (generalized) (M62.81);Difficulty in walking, not elsewhere classified (R26.2);History of falling (Z91.81)     Time: 1638-4665 PT Time Calculation (min) (ACUTE ONLY): 33 min  Charges:  $Therapeutic Activity: 23-37 mins                      Herminio Commons, PT, DPT 1:01 PM,02/26/22 Physical Therapist - [facility]

## 2022-02-26 NOTE — Progress Notes (Signed)
*  PRELIMINARY RESULTS* Echocardiogram 2D Echocardiogram has been performed.  [name] 02/26/2022, 7:42 AM

## 2022-02-26 NOTE — Plan of Care (Signed)

## 2022-02-27 ENCOUNTER — Inpatient Hospital Stay: Payer: Medicare Other

## 2022-02-27 DIAGNOSIS — I6381 Other cerebral infarction due to occlusion or stenosis of small artery: Secondary | ICD-10-CM

## 2022-02-27 DIAGNOSIS — I729 Aneurysm of unspecified site: Secondary | ICD-10-CM

## 2022-02-27 LAB — CBC
HCT: 36.2 % — ABNORMAL LOW (ref 39.0–52.0)
Hemoglobin: 11.8 g/dL — ABNORMAL LOW (ref 13.0–17.0)
MCH: 27.6 pg (ref 26.0–34.0)
MCHC: 32.6 g/dL (ref 30.0–36.0)
MCV: 84.8 fL (ref 80.0–100.0)
Platelets: 128 10*3/uL — ABNORMAL LOW (ref 150–400)
RBC: 4.27 MIL/uL (ref 4.22–5.81)
RDW: 15.5 % (ref 11.5–15.5)
WBC: 5.3 10*3/uL (ref 4.0–10.5)
nRBC: 0 % (ref 0.0–0.2)

## 2022-02-27 LAB — BASIC METABOLIC PANEL
Anion gap: 4 — ABNORMAL LOW (ref 5–15)
BUN: 16 mg/dL (ref 8–23)
CO2: 26 mmol/L (ref 22–32)
Calcium: 7.6 mg/dL — ABNORMAL LOW (ref 8.9–10.3)
Chloride: 111 mmol/L (ref 98–111)
Creatinine, Ser: 0.76 mg/dL (ref 0.61–1.24)
GFR, Estimated: >60 mL/min (ref >60)
Glucose, Bld: 81 mg/dL (ref 70–99)
Potassium: 3.4 mmol/L — ABNORMAL LOW (ref 3.5–5.1)
Sodium: 141 mmol/L (ref 135–145)

## 2022-02-27 LAB — MAGNESIUM: Magnesium: 2.3 mg/dL (ref 1.7–2.4)

## 2022-02-27 MED ORDER — CHLORHEXIDINE GLUCONATE CLOTH 2 % EX PADS
6.0000 | MEDICATED_PAD | Freq: Every day | CUTANEOUS | Status: DC
  Administered 2022-02-27 – 2022-03-02 (×4): 6 via TOPICAL

## 2022-02-27 MED ORDER — GADOBUTROL 1 MMOL/ML IV SOLN
10.0000 mL | Freq: Once | INTRAVENOUS | Status: AC | PRN
  Administered 2022-02-27: 10 mL via INTRAVENOUS

## 2022-02-27 MED ORDER — POTASSIUM CHLORIDE CRYS ER 20 MEQ PO TBCR
40.0000 meq | EXTENDED_RELEASE_TABLET | Freq: Once | ORAL | Status: AC
  Administered 2022-02-27: 40 meq via ORAL
  Filled 2022-02-27: qty 2

## 2022-02-27 NOTE — Plan of Care (Signed)
Neurology plan of care  88 yo man with hx HFpEF, lymphedema, severe AS, HTN, HL admitted after falling out of his recliner and presenting with AKI and rhabdo. MRI brain showed small acute infarct L periatrial white matter and questionable additional punctate infarct in L occipital pole.   MRA HEAD:   1. No emergent large vessel occlusion or high-grade stenosis. 2. A 4 mm left P-comm origin aneurysm.   MRA NECK:   Mild undulation along the course of the mid to distal left internal carotid artery, which may indicate fibromuscular dysplasia.  CNS imaging personally reviewed  Stroke workup completed other investigation into possible new AF.  # Stroke workup - Goal normotension - Continue plavix 75mg  daily (ASA allergic) - Confirm a fib on tele, consult cardiology if unclear  # 7mm L pcomm aneurysm - Given patient's age of 30 and poor functional status at baseline, do not recommend intervention for this unruptured aneurysm  Su Monks, MD [facility] [phone]  If 7pm- 7am, please page neurology on call as listed in [place].

## 2022-02-27 NOTE — Assessment & Plan Note (Signed)
51mm L pcomm aneurysm was found on MRA of head and neck.  Given patient old age and poor functional status at baseline, neurology is not recommending any intervention at this time.

## 2022-02-27 NOTE — Assessment & Plan Note (Signed)
MRI with concern of acute lacunar stroke. Completed stroke work-up. Neurology is on board and advising to continue with Plavix, aspirin allergies noted. Holding statin at this time due to rhabdomyolysis. -PT/OT are recommending SNF -TOC to find a place

## 2022-02-27 NOTE — Progress Notes (Signed)
Progress Note   PatientHance Caspers [name] YTK:[id number] DOB: July 06, 1930 DOA: 02/24/2022     2 DOS: the patient was seen and examined on 02/27/2022   Brief hospital course: [name] is a 88 y.o. male with medical history significant of HFpEF, lymphedema, severe aortic stenosis, hypertension, hyperlipidemia, presented to hospital after sustaining a ground-level fall. Patient predominantly sits in his reclining chair at home and often sleeps there as he prefers that over walking to his bed.  Patient was noted to have slumped down the chair and was on the floor.  She was unable to lift him off the floor, and due to this EMS was called.  In the ED, vitals were stable.  Initial lab work remarkable for potassium of 5.2.  CK elevated at 2934.  BNP elevated at 647.  Urinalysis with positive hemoglobin and RBCs, in addition to trace leukocytes and WBCs, however no bacteria seen. CT head obtained with no evidence of acute intracranial abnormality.  CT abdomen pelvis obtained with evidence of edema and skin thickening of the inferior gluteal region and infrarenal abdominal aortic aneurysm measuring 5.3 cm.  Bilateral hip/pelvis x-ray negative.  Chest x-ray with chronic changes but no acute cardiopulmonary disease.  Given given need for PT/OT evaluation and work-up for AKI, [facility] was contacted for admission.  10/14; MRI showed a small acute lacunar infarct in the left parietal white matter and questionable additional punctate infarcts in left occipital pole.  MRA of head and neck with no large vessel occlusion.  A 4 mm left P-comm origin aneurysm. Neurology was consulted. Stroke work-up completed.  Lipid panel mostly unremarkable, LDL of 66, BMP with some improvement of potassium to 3.4, magnesium normal, CK with some improvement to 1027(peaked at 3124), urine cultures negative. PT/OT are recommending SNF. Echocardiogram with normal EF, indeterminate diastolic function, no obvious source of cardiac thrombi or atrial  septal shunting.  Also noted to have severe aortic stenosis.  Patient sees Dr. Mariah Milling as an outpatient and most likely is not a candidate for any surgical intervention. Neurology saw him and he was started on Plavix along with statin.  Allergic to aspirin.  We will continue monitoring him on telemetry to rule out any atrial fibrillation.  Per chart review on arrival to ED patient was very uncapped and roaches and bedbugs were found on him.  APS was notified.       Assessment and Plan: * Acute lacunar stroke [facility]) MRI with concern of acute lacunar stroke. Completed stroke work-up. Neurology is on board and advising to continue with Plavix, aspirin allergies noted. Holding statin at this time due to rhabdomyolysis. -PT/OT are recommending SNF -TOC to find a place  Aneurysm (HCC) 57mm L pcomm aneurysm was found on MRA of head and neck.  Given patient old age and poor functional status at baseline, neurology is not recommending any intervention at this time.  Fall Patient presenting to the ED after sliding down his chair and falling onto the ground with a height of less than 1 foot.  Patient cannot recall slumping down, but states he has been feeling at his baseline.  Patient's daughter states patient frequently sleeps on the chair and occasionally struggles to get up to use his walker when he needs to use the restroom.  She suspects that is what occurred as he did have a bowel movement while in the floor.   -PT/OT recommending SNF.  Rhabdomyolysis CK peaked at 3124, now trending down. Patient received IV fluid -Monitor CK  Acute  kidney injury (HCC) Resolved Creatinine on presentation elevated at 1.54.  Last BMP obtained approximately 1.5 years ago at which time, creatinine was 0.96.  Patient had restarted torsemide this week. Renal ultrasound was negative for any significant abnormality.  Bilateral echogenic kidneys with benign cyst. Renal function improved with IV fluid most likely  secondary to dehydration and rhabdomyolysis. -Monitor renal function -Avoid nephrotoxins  Somnolence Resolved. Most likely secondary to metabolic encephalopathy on admission.  Sinus arrhythmia Telemetry with bradycardia with heart rate around 58, however EKG with normal heart rate.  Initial EKG consistent with sinus rhythm.  Repeat EKG personally reviewed, most consistent with sinus rhythm with frequent sinus pauses although it is being read as atrial fibrillation. Home metoprolol was held due to concern of bradycardia.  Current heart rate in 60s. -Current continue holding metoprolol and monitor  Aortic valve stenosis, severe Patient has a history of severe aortic valve stenosis with no repeat echo since 2019.  Which was also confirmed on repeat echo yesterday. Patient follow-up with Dr. Catering manager), most likely will not be a candidate for any surgical intervention. -Continue outpatient follow-up  (HFpEF) heart failure with preserved ejection fraction [facility]) Patient has a history of HFpEF likely secondary to severe aortic stenosis.   Also has an history of significant lower extremity lymphedema.  BNP elevated at 647. Patient also received IV fluid for rhabdomyolysis -Monitor volume status carefully  Lymphedema Chronic issue and is being managed as outpatient. - Can consider Unna boots  Generalized weakness In the setting of extremely sedentary lifestyle secondary to lymphedema.  Patient would likely benefit from rehabilitation. PT/OT are recommending SNF  Diabetes mellitus (HCC) Per chart review, patient has a history of diabetes mellitus, however he is not currently on any antiglycemic agents.  CBGs within goal at this time. A1c of 4.7 -CBG checked were discontinued  Coronary artery disease, non-occlusive - Restart home Plavix -Holding statin until rhabdomyolysis resolved.  Hypertension Per chart review, patient has a history of hypertension that is currently being  treated with metoprolol only.  Holding metoprolol at this time due to bradycardia.  He is normotensive at this time.     Subjective: Patient was seen and examined today.  Denies any pain.  Oriented to self only.  When asked about lower extremity lymphedema, stated that going on for a while.  Physical Exam: Vitals:   02/26/22 1535 02/26/22 1947 02/27/22 0336 02/27/22 0805  BP: (!) 133/39 (!) 123/44 (!) 115/47 (!) 134/41  Pulse: 69 71 66 66  Resp: 18 18  18   Temp: 98.2 F (36.8 C) 98.1 F (36.7 C) 98.1 F (36.7 C) 97.7 F (36.5 C)  TempSrc: Oral Oral Oral Oral  SpO2: 100% 99% 99% 100%  Weight:      Height:       General.  Frail elderly man, in no acute distress. Pulmonary.  Lungs clear bilaterally, normal respiratory effort. CV.  Regular rate and rhythm, no JVD, rub or murmur. Abdomen.  Soft, nontender, nondistended, BS positive. CNS.  Alert and oriented .  No focal neurologic deficit. Extremities.  Bilateral lymphedema with signs of chronic venous congestion and scaly skin, right more than left. Psychiatry.  Judgment and insight appears impaired.  Data Reviewed: Prior data reviewed  Family Communication: Discussed with stepdaughter on phone.  Disposition: Status is: Inpatient Remains inpatient appropriate because: Severity of illness   Planned Discharge Destination: Skilled nursing facility  Time spent: 50 minutes  This record has been created using [employer].  Errors have been sought and corrected,but may not always be located. Such creation errors do not reflect on the standard of care.  Author: Lorella Nimrod, MD 02/27/2022 2:28 PM  For on call review [email or web address].

## 2022-02-27 NOTE — Consult Note (Signed)
NEUROLOGY CONSULTATION NOTE   Date of service: February 27, 2022 Patient Name: [name] MRN:  [id number] DOB:  Jul 31, 1930 Reason for consult: stroke Requesting physician: Dr. Rebekah Chesterfield Pokhrel _ _ _   _ __   _ __ _ _  __ __   _ __   __ _  History of Present Illness   88 yo man with hx HFpEF, lymphedema, severe AS, HTN, HL admitted after falling out of his recliner and presenting with AKI and rhabdo. MRI brain showed small acute infarct L periatrial white matter and questionable additional punctate infarct in L occipital pole. Vascular imaging is pending. TTE no intracardiac clot.  Stroke Labs     Component Value Date/Time   CHOL 98 02/26/2022 0618   CHOL 177 04/02/2020 1029   CHOL 151 05/19/2013 0213   TRIG 52 02/26/2022 0618   TRIG 58 05/19/2013 0213   HDL 22 (L) 02/26/2022 0618   HDL 63 04/02/2020 1029   HDL 49 05/19/2013 0213   CHOLHDL 4.5 02/26/2022 0618   VLDL 10 02/26/2022 0618   VLDL 12 05/19/2013 0213   LDLCALC 66 02/26/2022 0618   LDLCALC 100 (H) 04/02/2020 1029   LDLCALC 90 05/19/2013 0213   LABVLDL 14 04/02/2020 1029    Lab Results  Component Value Date/Time   HGBA1C 4.7 (L) 02/24/2022 08:52 PM   HGBA1C 5.9 05/21/2013 04:03 AM   EKG c/f possible new dx a fib  Patient has baseline significant cognitive impairment per RN.   ROS   UTA 2/2 mental status  Past History   I have reviewed the following:  Past Medical History:  Diagnosis Date   (HFpEF) heart failure with preserved ejection fraction (HCC)    a. 03/2018 Echo: EF 60-65%, Gr1 DD.   Aortic atherosclerosis (HCC) 05/2013   Per TEE   Arrhythmia    Asthma    CHF (congestive heart failure) (HCC)    Colon polyp    Coronary artery disease    a. Nonobstructive by 2004 cath b. 05/2013 NSTEMI/Cath: >M 30, LAD 22m, LCX 20m, RCA 30p, EF 60%, ? Sev AS (mod by TEE).   GERD (gastroesophageal reflux disease)    Heart murmur    Hiatal hernia    Hyperlipidemia    Hypertension    Lymphedema    Mitral  regurgitation    a. 03/2018 Echo: Mild to mod MR.   Obesity    Onychomycosis    Osteoarthritis    Recurrent cellulitis of lower extremity    Severe aortic stenosis    a. 2013 Echo: EF 55%, mod AS; b. TEE 05/2013: EF EF 60-65%, mod AS; c.10/2014 Echo: 55-60%, mod AS; d. 10/2016 Echo: EF 65-70%, Mod-Sev AS; e. 03/2018 Ehco: EF 60-65%, Sev AS, mild to mod AI. Mean grad (S) , Valve Area (VTI): 0.92cm^2.   Past Surgical History:  Procedure Laterality Date   CARDIAC CATHETERIZATION  11/2002   CARDIAC CATHETERIZATION  05/21/2013   showing 30% oLM, 30% D1, 20% mLCx, 30% pRCA; EF 60%, severe AS (mean gradient 28 mmHg, peak 26, area 0.9)   CHOLECYSTECTOMY  2001   TRANSESOPHAGEAL ECHOCARDIOGRAM  05/22/2013   Preserved EF, moderate AS (valve area by planimetry between 1.0-1.26 cm sq), moderate aortic arch and descending aortic atherosclerosis.    TRANSTHORACIC ECHOCARDIOGRAM  05/19/2013   EF 60-65%, impaired diastolic function, mild LA dilatation, mild MR/AI/TR, mod AS, high normal RVSP (30.4 mmHg)   Family History  Problem Relation Age of Onset   Heart disease Mother  Heart disease Father 79   Stroke Brother    Social History   Socioeconomic History   Marital status: Widowed    Spouse name: Not on file   Number of children: Not on file   Years of education: Not on file   Highest education level: Not on file  Occupational History   Occupation: Retired Electronics engineer - Barista     Comment: Acupuncturist  Tobacco Use   Smoking status: Former    Packs/Almanzar: 1.50    Years: 20.00    Total pack years: 30.00    Types: Cigarettes    Quit date: 02/22/1981    Years since quitting: 41.0   Smokeless tobacco: Never  Vaping Use   Vaping Use: Never used  Substance and Sexual Activity   Alcohol use: No   Drug use: No   Sexual activity: Not Currently  Other Topics Concern   Not on file  Social History Narrative   Mr. Bia lives in [place] with his daughter. Wife passed away a few  months ago. They have a son and daughter. He is retired from [employer]. He enjoys fishing.   Social Determinants of Health   Financial Resource Strain: Low Risk  (04/05/2017)   Overall Financial Resource Strain (CARDIA)    Difficulty of Paying Living Expenses: Not hard at all  Food Insecurity: No Food Insecurity (04/05/2017)   Hunger Vital Sign    Worried About Running Out of Food in the Last Year: Never true    Ran Out of Food in the Last Year: Never true  Transportation Needs: No Transportation Needs (04/05/2017)   PRAPARE - Administrator, Civil Service (Medical): No    Lack of Transportation (Non-Medical): No  Physical Activity: Inactive (04/05/2017)   Exercise Vital Sign    Days of Exercise per Week: 0 days    Minutes of Exercise per Session: 0 min  Stress: No Stress Concern Present (04/05/2017)   [employer] of Occupational Health - Occupational Stress Questionnaire    Feeling of Stress : Only a little  Social Connections: Unknown (04/05/2017)   Social Connection and Isolation Panel [NHANES]    Frequency of Communication with Friends and Family: Patient refused    Frequency of Social Gatherings with Friends and Family: Patient refused    Attends Religious Services: Patient refused    Database administrator or Organizations: Patient refused    Attends Engineer, structural: Patient refused    Marital Status: Patient refused   Allergies  Allergen Reactions   Aspirin Rash, Other (See Comments) and Hives    Reaction: Trouble breathing     Medications   Medications Prior to Admission  Medication Sig Dispense Refill Last Dose   clopidogrel (PLAVIX) 75 MG tablet TAKE 1 TABLET(75 MG) BY MOUTH DAILY (Patient taking differently: Take 75 mg by mouth daily.) 90 tablet 3 02/23/2022   metoprolol succinate (TOPROL-XL) 25 MG 24 hr tablet TAKE 1 TABLET(25 MG) BY MOUTH DAILY (Patient taking differently: Take 25 mg by mouth daily.) 90 tablet 3 02/23/2022    torsemide (DEMADEX) 20 MG tablet Take 20 mg by mouth 2 (two) times daily. Pt taking once or twice daily         Current Facility-Administered Medications:    acetaminophen (TYLENOL) tablet 650 mg, 650 mg, Oral, Q6H PRN **OR** acetaminophen (TYLENOL) suppository 650 mg, 650 mg, Rectal, Q6H PRN, Verdene Lennert, MD   Chlorhexidine Gluconate Cloth 2 % PADS 6 each, 6 each,  Topical, Daily, Manuela Schwartz, NP   clopidogrel (PLAVIX) tablet 75 mg, 75 mg, Oral, Daily, Verdene Lennert, MD, 75 mg at 02/26/22 0806   enoxaparin (LOVENOX) injection 50 mg, 50 mg, Subcutaneous, Q24H, Hallaji, Sheema M, RPH, 50 mg at 02/26/22 0737   folic acid (FOLVITE) tablet 1 mg, 1 mg, Oral, Daily, Verdene Lennert, MD, 1 mg at 02/26/22 0806   morphine (PF) 2 MG/ML injection 1 mg, 1 mg, Intravenous, Once, Manuela Schwartz, NP   multivitamin with minerals tablet 1 tablet, 1 tablet, Oral, Daily, Verdene Lennert, MD, 1 tablet at 02/26/22 0806   polyethylene glycol (MIRALAX / GLYCOLAX) packet 17 g, 17 g, Oral, Daily PRN, Verdene Lennert, MD   thiamine (VITAMIN B1) tablet 100 mg, 100 mg, Oral, Daily, Verdene Lennert, MD, 100 mg at 02/26/22 0806  Vitals   Vitals:   02/26/22 1532 02/26/22 1535 02/26/22 1947 02/27/22 0336  BP: (!) 133/39 (!) 133/39 (!) 123/44 (!) 115/47  Pulse: 69 69 71 66  Resp: 18 18 18    Temp: 98.2 F (36.8 C) 98.2 F (36.8 C) 98.1 F (36.7 C) 98.1 F (36.7 C)  TempSrc: Oral Oral Oral Oral  SpO2: 100% 100% 99% 99%  Weight:      Height:         Body mass index is 32.64 kg/m.  Physical Exam   Physical Exam Gen: alert, oriented to first name only, follows some simple commands Resp: CTAB, no w/r/r CV: RRR, no m/g/r; nml S1 and S2. 2+ symmetric peripheral pulses.  Neuro: *MS: alert, oriented to first name only, follows some simple commands *Speech: moderate dysarthria, unable to name and repeat  *CN: PERRL, blinks to threat bilat, EOMI, R NLF flattening, hearing intact to voice *Motor:  strength exam impaired 2/2 inability to follow commands but at least antigravity BUE, 2/5 BLE with marked lymphedema *Sensory: SILT *Reflexes:  1+ and symmetric throughout without clonus; toes mute bilat *Coordination, Gait: UTA  NIHSS  1a Level of Conscious.: 0 1b LOC Questions: 2 1c LOC Commands: 1 2 Best Gaze: 0 3 Visual: 0 4 Facial Palsy: 1 5a Motor Arm - left: 2 5b Motor Arm - Right: 2 6a Motor Leg - Left: 3 6b Motor Leg - Right: 3 7 Limb Ataxia: 0 8 Sensory: 0 9 Best Language: 2 10 Dysarthria: 2 11 Extinct. and Inatten.: 0  TOTAL: 18   Premorbid mRS = 4   Labs   CBC:  Recent Labs  Lab 02/24/22 1730 02/25/22 0633 02/26/22 0618 02/27/22 0546  WBC 10.2 7.3 6.0 5.3  NEUTROABS 8.4* 5.5  --   --   HGB 14.3 13.1 11.9* 11.8*  HCT 43.6 39.3 36.7* 36.2*  MCV 84.2 84.7 85.3 84.8  PLT 156 118* 132* 128*    Basic Metabolic Panel:  Lab Results  Component Value Date   NA 141 02/27/2022   K 3.4 (L) 02/27/2022   CO2 26 02/27/2022   GLUCOSE 81 02/27/2022   BUN 16 02/27/2022   CREATININE 0.76 02/27/2022   CALCIUM 7.6 (L) 02/27/2022   GFRNONAA >60 02/27/2022   GFRAA 82 06/20/2020   Lipid Panel:  Lab Results  Component Value Date   LDLCALC 66 02/26/2022   HgbA1c:  Lab Results  Component Value Date   HGBA1C 4.7 (L) 02/24/2022   Urine Drug Screen: No results found for: "LABOPIA", "COCAINSCRNUR", "LABBENZ", "AMPHETMU", "THCU", "LABBARB"  Alcohol Level     Component Value Date/Time   ETH <10 02/24/2022 1730    Impression  88 yo man with hx HFpEF, lymphedema, severe AS, HTN, HL admitted after falling out of his recliner and presenting with AKI and rhabdo. MRI brain showed small acute infarct L periatrial white matter and questionable additional punctate infarct in L occipital pole. Stroke workup completed other than vascular imaging and investigation into possible new AF.  Recommendations   - MRA H&N - Continue plavix 75mg  daily (ASA allergic) -  Confirm a fib on tele, consult cardiology if unclear - Will continue to follow ______________________________________________________________________   Thank you for the opportunity to take part in the care of this patient. If you have any further questions, please contact the neurology consultation attending.  Signed,  Su Monks, MD [facility] [phone]  If 7pm- 7am, please page neurology on call as listed in [place].  **Any copied and pasted documentation in this note was written by me in another application not billed for and pasted by me into this document.

## 2022-02-28 DIAGNOSIS — I6381 Other cerebral infarction due to occlusion or stenosis of small artery: Secondary | ICD-10-CM | POA: Diagnosis not present

## 2022-02-28 LAB — BASIC METABOLIC PANEL
Anion gap: 5 (ref 5–15)
BUN: 13 mg/dL (ref 8–23)
CO2: 28 mmol/L (ref 22–32)
Calcium: 7.7 mg/dL — ABNORMAL LOW (ref 8.9–10.3)
Chloride: 108 mmol/L (ref 98–111)
Creatinine, Ser: 0.8 mg/dL (ref 0.61–1.24)
GFR, Estimated: >60 mL/min (ref >60)
Glucose, Bld: 85 mg/dL (ref 70–99)
Potassium: 3.8 mmol/L (ref 3.5–5.1)
Sodium: 141 mmol/L (ref 135–145)

## 2022-02-28 LAB — CK: Total CK: 278 U/L (ref 49–397)

## 2022-02-28 MED ORDER — QUETIAPINE FUMARATE 25 MG PO TABS
25.0000 mg | ORAL_TABLET | Freq: Every day | ORAL | Status: DC
  Administered 2022-02-28: 25 mg via ORAL
  Filled 2022-02-28: qty 1

## 2022-02-28 MED ORDER — ROSUVASTATIN CALCIUM 10 MG PO TABS
20.0000 mg | ORAL_TABLET | Freq: Every day | ORAL | Status: DC
  Administered 2022-02-28 – 2022-03-02 (×3): 20 mg via ORAL
  Filled 2022-02-28 (×3): qty 2

## 2022-02-28 NOTE — Progress Notes (Signed)
Patient repeatedly taking off his telemetry leads. Patient becoming agitated when I try to replace them. MD aware.

## 2022-02-28 NOTE — Assessment & Plan Note (Signed)
Resolved Creatinine on presentation elevated at 1.54.  Last BMP obtained approximately 1.5 years ago at which time, creatinine was 0.96.  Patient had restarted torsemide this week. Renal ultrasound was negative for any significant abnormality.  Bilateral echogenic kidneys with benign cyst. Renal function improved with IV fluid most likely secondary to dehydration and rhabdomyolysis. -Monitor renal function -Avoid nephrotoxins 

## 2022-02-28 NOTE — Assessment & Plan Note (Signed)
MRI with concern of acute lacunar stroke. Completed stroke work-up. Neurology is on board and advising to continue with Plavix, aspirin allergies noted. Start statin as rhabdomyolysis has been resolved. -PT/OT are recommending SNF -TOC to find a place -Neurology is recommending ZIO monitor placement before discharge

## 2022-02-28 NOTE — Progress Notes (Signed)
Progress Note   PatientKinsley Nicklaus [name] ZOX:[id number] DOB: 12-06-1930 DOA: 02/24/2022     3 DOS: the patient was seen and examined on 02/28/2022   Brief hospital course: [name] is a 88 y.o. male with medical history significant of HFpEF, lymphedema, severe aortic stenosis, hypertension, hyperlipidemia, presented to hospital after sustaining a ground-level fall. Patient predominantly sits in his reclining chair at home and often sleeps there as he prefers that over walking to his bed.  Patient was noted to have slumped down the chair and was on the floor.  She was unable to lift him off the floor, and due to this EMS was called.  In the ED, vitals were stable.  Initial lab work remarkable for potassium of 5.2.  CK elevated at 2934.  BNP elevated at 647.  Urinalysis with positive hemoglobin and RBCs, in addition to trace leukocytes and WBCs, however no bacteria seen. CT head obtained with no evidence of acute intracranial abnormality.  CT abdomen pelvis obtained with evidence of edema and skin thickening of the inferior gluteal region and infrarenal abdominal aortic aneurysm measuring 5.3 cm.  Bilateral hip/pelvis x-ray negative.  Chest x-ray with chronic changes but no acute cardiopulmonary disease.  Given given need for PT/OT evaluation and work-up for AKI, [facility] was contacted for admission.  10/14; MRI showed a small acute lacunar infarct in the left parietal white matter and questionable additional punctate infarcts in left occipital pole.  MRA of head and neck with no large vessel occlusion.  A 4 mm left P-comm origin aneurysm. Neurology was consulted. Stroke work-up completed.  Lipid panel mostly unremarkable, LDL of 66, BMP with some improvement of potassium to 3.4, magnesium normal, CK with some improvement to 1027(peaked at 3124), urine cultures negative. PT/OT are recommending SNF. Echocardiogram with normal EF, indeterminate diastolic function, no obvious source of cardiac thrombi or atrial  septal shunting.  Also noted to have severe aortic stenosis.  Patient sees Dr. Mariah Milling as an outpatient and most likely is not a candidate for any surgical intervention. Neurology saw him and he was started on Plavix along with statin.  Allergic to aspirin.  We will continue monitoring him on telemetry to rule out any atrial fibrillation.  Per chart review on arrival to ED patient was very uncapped and roaches and bedbugs were found on him.  APS was notified.   10/15: Patient with overnight agitated, this morning he pulled his IV and telemetry wires and does not let them replaced. CK normalized this morning and BMP without any significant abnormality today.   Patient is currently full code.  High risk for deterioration and mortality based on age, advanced dementia and underlying comorbidities.  Palliative care was consulted. Neurology is recommending ZIO monitor placement before discharge.   Assessment and Plan: * Acute lacunar stroke [facility]) MRI with concern of acute lacunar stroke. Completed stroke work-up. Neurology is on board and advising to continue with Plavix, aspirin allergies noted. Start statin as rhabdomyolysis has been resolved. -PT/OT are recommending SNF -TOC to find a place -Neurology is recommending ZIO monitor placement before discharge  Aneurysm (HCC) 56mm L pcomm aneurysm was found on MRA of head and neck.  Given patient old age and poor functional status at baseline, neurology is not recommending any intervention at this time.  Fall Patient presenting to the ED after sliding down his chair and falling onto the ground with a height of less than 1 foot.  Patient cannot recall slumping down, but states he has been  feeling at his baseline.  Patient's daughter states patient frequently sleeps on the chair and occasionally struggles to get up to use his walker when he needs to use the restroom.  She suspects that is what occurred as he did have a bowel movement while in the floor.    -PT/OT recommending SNF.  Rhabdomyolysis Resolved CK peaked at 3124, now normalized. Patient received IV fluid   Acute kidney injury ([place]) Resolved Creatinine on presentation elevated at 1.54.  Last BMP obtained approximately 1.5 years ago at which time, creatinine was 0.96.  Patient had restarted torsemide this week. Renal ultrasound was negative for any significant abnormality.  Bilateral echogenic kidneys with benign cyst. Renal function improved with IV fluid most likely secondary to dehydration and rhabdomyolysis. -Monitor renal function -Avoid nephrotoxins  Somnolence Resolved. Most likely secondary to metabolic encephalopathy on admission.  Sinus arrhythmia Telemetry with bradycardia with heart rate around 58, however EKG with normal heart rate.  Initial EKG consistent with sinus rhythm.  Repeat EKG personally reviewed, most consistent with sinus rhythm with frequent sinus pauses although it is being read as atrial fibrillation. Home metoprolol was held due to concern of bradycardia.  Current heart rate in 60s. -Current continue holding metoprolol and monitor  Aortic valve stenosis, severe Patient has a history of severe aortic valve stenosis with no repeat echo since 2019.  Which was also confirmed on repeat echo yesterday. Patient follow-up with Dr. Youth worker), most likely will not be a candidate for any surgical intervention. -Continue outpatient follow-up  (HFpEF) heart failure with preserved ejection fraction [facility]) Patient has a history of HFpEF likely secondary to severe aortic stenosis.   Also has an history of significant lower extremity lymphedema.  BNP elevated at 647. Patient also received IV fluid for rhabdomyolysis -Monitor volume status carefully  Lymphedema Chronic issue and is being managed as outpatient. - Can consider Unna boots  Generalized weakness In the setting of extremely sedentary lifestyle secondary to lymphedema.  Patient would  likely benefit from rehabilitation. PT/OT are recommending SNF  Diabetes mellitus ([place]) Per chart review, patient has a history of diabetes mellitus, however he is not currently on any antiglycemic agents.  CBGs within goal at this time. A1c of 4.7 -CBG checked were discontinued  Coronary artery disease, non-occlusive - Restart home Plavix -Holding statin until rhabdomyolysis resolved.  Hypertension Per chart review, patient has a history of hypertension that is currently being treated with metoprolol only.  Holding metoprolol at this time due to bradycardia.  He is normotensive at this time.     Subjective: Patient was seen and examined today.  Oriented to self only.  Some reported agitation earlier.  Currently pretty calm as step daughter was at bedside.  Physical Exam: Vitals:   02/27/22 1929 02/27/22 2002 02/28/22 0416 02/28/22 0800  BP: (!) 137/43 (!) 140/52 (!) 121/45 (!) 138/49  Pulse: 81 81 77 76  Resp: 20 18 18 18   Temp: 98.6 F (37 C) 98.3 F (36.8 C) 98.7 F (37.1 C) 98.7 F (37.1 C)  TempSrc: Oral Oral Oral Oral  SpO2: 99% 99% 98% 100%  Weight:      Height:       General.  Frail elderly man, in no acute distress. Pulmonary.  Lungs clear bilaterally, normal respiratory effort. CV.  Regular rate and rhythm, no JVD, rub or murmur. Abdomen.  Soft, nontender, nondistended, BS positive. CNS.  Alert and oriented .  No focal neurologic deficit. Extremities.  Bilateral lower extremity lymphedema, right more  than left with signs of chronic venous congestion and scaly skin Psychiatry.  Judgment and insight appears impaired  Data Reviewed: Prior data reviewed  Family Communication: Discussed with stepdaughter at bedside.  Disposition: Status is: Inpatient Remains inpatient appropriate because: Severity of illness   Planned Discharge Destination: Skilled nursing facility  Time spent: 40 minutes  This record has been created using Manufacturing engineer. Errors have been sought and corrected,but may not always be located. Such creation errors do not reflect on the standard of care.  Author: Arnetha Courser, MD 02/28/2022 2:28 PM  For on call review [email or web address].

## 2022-02-28 NOTE — NC FL2 (Signed)
[place] LEVEL OF CARE SCREENING TOOL     IDENTIFICATION  Patient Name: [name] Birthdate: 12/18/1930 Sex: male Admission Date (Current Location): 02/24/2022  [facility] and [place] Number:  Engineering geologist and Address:         Provider Number: [id number]  Attending Physician Name and Address:  Lorella Nimrod, MD  Relative Name and Phone Number:       Current Level of Care: Hospital Recommended Level of Care: [place] Prior Approval Number:    Date Approved/Denied:   PASRR Number: [id number]  Discharge Plan: SNF    Current Diagnoses: Patient Active Problem List   Diagnosis Date Noted   Aneurysm ([place]) 02/27/2022   Pressure injury of skin 02/26/2022   Acute lacunar stroke ([place]) 02/26/2022   Sepsis ([place]) 02/25/2022   Acute kidney injury ([place]) 02/24/2022   Rhabdomyolysis 02/24/2022   Generalized weakness 02/24/2022   Somnolence 02/24/2022   Sinus arrhythmia 02/24/2022   (HFpEF) heart failure with preserved ejection fraction ([place]) 02/24/2022   Fall 02/24/2022   Right leg swelling 12/03/2019   Hyperbilirubinemia 12/03/2019   Cellulitis 03/17/2018   Acute on chronic heart failure ([place]) 10/19/2017   Poor social situation 09/06/2017   Chronic diastolic heart failure ([place])    Venous ulcer ([place]) 07/08/2017   Osteoarthritis 05/28/2017   Varicose veins of both lower extremities with pain 04/10/2017   Iron deficiency anemia 11/25/2016   Chronic venous insufficiency 10/04/2016   Lymphedema 06/08/2016   Morbid obesity ([place]) 03/26/2015   Coronary artery disease, non-occlusive 06/05/2013   Hypertension 06/05/2013   Hyperlipidemia 02/23/2012   Diabetes mellitus ([place]) 02/23/2012   Aortic valve stenosis, severe 02/23/2012    Orientation RESPIRATION BLADDER Height & Weight     Self  Normal Indwelling catheter Weight: 106.1 kg Height:  5\' 11"  (180.3 cm)  BEHAVIORAL SYMPTOMS/MOOD NEUROLOGICAL BOWEL NUTRITION STATUS      Continent  Diet (regular)  AMBULATORY STATUS COMMUNICATION OF NEEDS Skin   Total Care Verbally PU Stage and Appropriate Care, Skin abrasions                       Personal Care Assistance Level of Assistance              Functional Limitations Info             SPECIAL CARE FACTORS FREQUENCY  PT (By licensed PT), OT (By licensed OT)                    Contractures Contractures Info: Not present    Additional Factors Info  Code Status, Allergies Code Status Info: Full Allergies Info: Aspirin           Current Medications (02/28/2022):  This is the current hospital active medication list Current Facility-Administered Medications  Medication Dose Route Frequency Provider Last Rate Last Admin   acetaminophen (TYLENOL) tablet 650 mg  650 mg Oral Q6H PRN Jose Persia, MD       Or   acetaminophen (TYLENOL) suppository 650 mg  650 mg Rectal Q6H PRN Jose Persia, MD       Chlorhexidine Gluconate Cloth 2 % PADS 6 each  6 each Topical Daily Sharion Settler, NP   6 each at 02/28/22 0825   clopidogrel (PLAVIX) tablet 75 mg  75 mg Oral Daily Jose Persia, MD   75 mg at 02/28/22 0824   enoxaparin (LOVENOX) injection 50 mg  50 mg Subcutaneous Q24H Hallaji, Sheema M,  RPH   50 mg at 02/28/22 0824   folic acid (FOLVITE) tablet 1 mg  1 mg Oral Daily Verdene Lennert, MD   1 mg at 02/28/22 2458   morphine (PF) 2 MG/ML injection 1 mg  1 mg Intravenous Once Manuela Schwartz, NP       multivitamin with minerals tablet 1 tablet  1 tablet Oral Daily Verdene Lennert, MD   1 tablet at 02/28/22 0824   polyethylene glycol (MIRALAX / GLYCOLAX) packet 17 g  17 g Oral Daily PRN Verdene Lennert, MD       QUEtiapine (SEROQUEL) tablet 25 mg  25 mg Oral QHS Arnetha Courser, MD       thiamine (VITAMIN B1) tablet 100 mg  100 mg Oral Daily Verdene Lennert, MD   100 mg at 02/28/22 0998     Discharge Medications: Please see discharge summary for a list of discharge medications.  Relevant Imaging  Results:  Relevant Lab Results:   Additional Information SSN: [id number]  Chapman Fitch, RN

## 2022-02-28 NOTE — TOC Progression Note (Signed)
Transition of Care [facility]) - Progression Note    Patient Details  Name: [name] MRN: [id number] Date of Birth: Nov 26, 1930  Transition of Care [facility]) CM/SW Contact  Beverly Sessions, RN Phone Number: 02/28/2022, 11:18 AM  Clinical Narrative:     Patient A&O x1.  Followed up with daughter about disposition. She states that since he is not able to stand and transfer she is agreeable to SNF bed Search  Existing PASSR Fl2 sent for signature Bed search initiated   Expected Discharge Plan: [place] Barriers to Discharge: Continued Medical Work up  Expected Discharge Plan and Services Expected Discharge Plan: [place] Acute Care Choice: Resumption of Svcs/PTA Provider Living arrangements for the past 2 months: Single Family Home                                       Social Determinants of Health (SDOH) Interventions    Readmission Risk Interventions     No data to display

## 2022-02-28 NOTE — Assessment & Plan Note (Signed)
Resolved CK peaked at 3124, now normalized. Patient received IV fluid

## 2022-03-01 DIAGNOSIS — I5032 Chronic diastolic (congestive) heart failure: Secondary | ICD-10-CM | POA: Diagnosis not present

## 2022-03-01 DIAGNOSIS — R531 Weakness: Secondary | ICD-10-CM | POA: Diagnosis not present

## 2022-03-01 DIAGNOSIS — R410 Disorientation, unspecified: Secondary | ICD-10-CM | POA: Diagnosis not present

## 2022-03-01 DIAGNOSIS — Z66 Do not resuscitate: Secondary | ICD-10-CM

## 2022-03-01 DIAGNOSIS — Z515 Encounter for palliative care: Secondary | ICD-10-CM

## 2022-03-01 DIAGNOSIS — I350 Nonrheumatic aortic (valve) stenosis: Secondary | ICD-10-CM | POA: Diagnosis not present

## 2022-03-01 NOTE — Consult Note (Signed)
Consultation Note Date: 03/01/2022   Patient Name: [name]  DOB: 05-03-1931  MRN: [id number]  Age / Sex: 88 y.o., male  PCP: Leone Haven, MD Referring Physician: Carlyle Lipa, MD  Reason for Consultation: Establishing goals of care and Psychosocial/spiritual support  HPI/Patient Profile: 88 y.o. male   admitted on 02/24/2022 with past medical history significant of HFpEF, lymphedema, severe aortic stenosis, hypertension, CAD , hyperlipidemia, presenting to the ED after a ground-level fall.  Patient somnolent and unable to offer personal history .  Patient lives with stepdaughter, she reports that he primarily is sedentary in [place] chair, all been sleeping they are preferring over walking to his bed.  According to admission note EMS reports living environment was heavily contaminated with spoiled food,Insects and patient had urine and stool on him.   ED course: On arrival to the ED, patient was afebrile at 97.1 with heart rate of 53 and blood pressure of 128/86.  He was saturating at 100% on room air.  Initial lab work remarkable for potassium of 5.2 and an of 1.5.  CK elevated at 2934.  BNP elevated at 647.  Urinalysis with positive hemoglobin and RBCs, in addition to trace leukocytes and WBCs, however no bacteria seen. CT head obtained with no evidence of acute intracranial abnormality.  CT abdomen pelvis obtained with evidence of edema and skin thickening of the inferior gluteal region and infrarenal abdominal aortic aneurysm measuring 5.3 cm.  Bilateral hip/pelvis x-ray negative.  Chest x-ray with chronic changes but no acute cardiopulmonary disease.  Given given need for PT/OT evaluation and work-up for AKI, TRH contacted for admission.   Today is [place] 5 of this hospitalization.   Patient and family face treatment option decisions, advanced directive    Clinical Assessment and Goals of  Care:  This NP [name] reviewed medical records, received report from team, and spoke by phone with his brother/next of kin/ [name] and step-daughter/ [name]  to discuss diagnosis, prognosis, GOC, EOL wishes disposition and options.   Concept of Palliative Care was introduced as specialized medical care for people and their families living with serious illness.  If focuses on providing relief from the symptoms and stress of a serious illness.  The goal is to improve quality of life for both the patient and the family. Values and goals of care important to patient and family were attempted to be elicited.   Education offered that decision maker for this patient at this point in time is his next of kin, his brother [name].  Patient does not have medical decision-making capacity at this time.  In talking with family it is uncertain what baseline cognition actually has.  A  discussion was had today regarding advanced directives.  Concepts specific to code status, artifical feeding and hydration, continued IV antibiotics and rehospitalization was had.    The difference between a aggressive medical intervention path  and a palliative comfort care path for this patient at this time was had.     Although  stepdaughter would like to see patient return home, patient's brother feels strongly that current living situation is not safe for [name]. An APS report has been made 2/2 to living condition discovered at the home by EMS.   Patient has not had any primary care follow-up in over a year.  Patient's brother verbalizes concern over his inability to process any necessary paperwork or services moving forward secondary to tells me "lack of education".  Brother would like to see patient access long-term placement.   Questions and concerns addressed.  Patient  encouraged to call with questions or concerns.     PMT will continue to support holistically.         No documented HPOA or Advanced  care planning documented    NEXT OF KIN/brother/[name]  Education offered on the importance of documentation of advance care planning, patient is high risk for decompensation.    SUMMARY OF RECOMMENDATIONS    Code Status /Advance Care Planning: DNR-documented today , discussed in detail with brother/next of kin    Palliative Prophylaxis:  Aspiration, Delirium Protocol, and Oral Care  Additional Recommendations (Limitations, Scope, Preferences): Full Scope Treatment  Psycho-social/Spiritual:  Desire for further Chaplaincy support:no   Prognosis:  Unable to determine  Discharge Planning: Skilled Nursing Facility for rehab with Palliative care service follow-up      Primary Diagnoses: Present on Admission:  Acute kidney injury [facility])  Rhabdomyolysis  Coronary artery disease, non-occlusive  Hypertension  Fall  Acute lacunar stroke (HCC)   I have reviewed the medical record, interviewed the patient and family, and examined the patient. The following aspects are pertinent.  Past Medical History:  Diagnosis Date   (HFpEF) heart failure with preserved ejection fraction (HCC)    a. 03/2018 Echo: EF 60-65%, Gr1 DD.   Aortic atherosclerosis (HCC) 05/2013   Per TEE   Arrhythmia    Asthma    CHF (congestive heart failure) (HCC)    Colon polyp    Coronary artery disease    a. Nonobstructive by 2004 cath b. 05/2013 NSTEMI/Cath: >M 30, LAD 67m, LCX 33m, RCA 30p, EF 60%, ? Sev AS (mod by TEE).   GERD (gastroesophageal reflux disease)    Heart murmur    Hiatal hernia    Hyperlipidemia    Hypertension    Lymphedema    Mitral regurgitation    a. 03/2018 Echo: Mild to mod MR.   Obesity    Onychomycosis    Osteoarthritis    Recurrent cellulitis of lower extremity    Severe aortic stenosis    a. 2013 Echo: EF 55%, mod AS; b. TEE 05/2013: EF EF 60-65%, mod AS; c.10/2014 Echo: 55-60%, mod AS; d. 10/2016 Echo: EF 65-70%, Mod-Sev AS; e. 03/2018 Ehco: EF 60-65%, Sev AS, mild to mod  AI. Mean grad (S) , Valve Area (VTI): 0.92cm^2.   Social History   Socioeconomic History   Marital status: Widowed    Spouse name: Not on file   Number of children: Not on file   Years of education: Not on file   Highest education level: Not on file  Occupational History   Occupation: Retired Electronics engineer - Barista     Comment: Acupuncturist  Tobacco Use   Smoking status: Former    Packs/Lauricella: 1.50    Years: 20.00    Total pack years: 30.00    Types: Cigarettes    Quit date: 02/22/1981    Years since quitting: 41.0   Smokeless tobacco:  Never  Vaping Use   Vaping Use: Never used  Substance and Sexual Activity   Alcohol use: No   Drug use: No   Sexual activity: Not Currently  Other Topics Concern   Not on file  Social History Narrative   Mr. Willinger lives in [place] with his daughter. Wife passed away a few months ago. They have a son and daughter. He is retired from [employer]. He enjoys fishing.   Social Determinants of Health   Financial Resource Strain: Low Risk  (04/05/2017)   Overall Financial Resource Strain (CARDIA)    Difficulty of Paying Living Expenses: Not hard at all  Food Insecurity: No Food Insecurity (04/05/2017)   Hunger Vital Sign    Worried About Running Out of Food in the Last Year: Never true    Ran Out of Food in the Last Year: Never true  Transportation Needs: No Transportation Needs (04/05/2017)   PRAPARE - Administrator, Civil Service (Medical): No    Lack of Transportation (Non-Medical): No  Physical Activity: Inactive (04/05/2017)   Exercise Vital Sign    Days of Exercise per Week: 0 days    Minutes of Exercise per Session: 0 min  Stress: No Stress Concern Present (04/05/2017)   [employer] of Occupational Health - Occupational Stress Questionnaire    Feeling of Stress : Only a little  Social Connections: Unknown (04/05/2017)   Social Connection and Isolation Panel [NHANES]    Frequency of Communication with  Friends and Family: Patient refused    Frequency of Social Gatherings with Friends and Family: Patient refused    Attends Religious Services: Patient refused    Database administrator or Organizations: Patient refused    Attends Engineer, structural: Patient refused    Marital Status: Patient refused   Family History  Problem Relation Age of Onset   Heart disease Mother    Heart disease Father 74   Stroke Brother    Scheduled Meds:  Chlorhexidine Gluconate Cloth  6 each Topical Daily   clopidogrel  75 mg Oral Daily   enoxaparin (LOVENOX) injection  50 mg Subcutaneous Q24H   folic acid  1 mg Oral Daily    morphine injection  1 mg Intravenous Once   multivitamin with minerals  1 tablet Oral Daily   QUEtiapine  25 mg Oral QHS   rosuvastatin  20 mg Oral Daily   thiamine  100 mg Oral Daily   Continuous Infusions: PRN Meds:.acetaminophen **OR** acetaminophen, polyethylene glycol Medications Prior to Admission:  Prior to Admission medications   Medication Sig Start Date End Date Taking? Authorizing Provider  clopidogrel (PLAVIX) 75 MG tablet TAKE 1 TABLET(75 MG) BY MOUTH DAILY Patient taking differently: Take 75 mg by mouth daily. 06/03/21  Yes Alver Sorrow, NP  metoprolol succinate (TOPROL-XL) 25 MG 24 hr tablet TAKE 1 TABLET(25 MG) BY MOUTH DAILY Patient taking differently: Take 25 mg by mouth daily. 04/14/21  Yes Alver Sorrow, NP  torsemide (DEMADEX) 20 MG tablet Take 20 mg by mouth 2 (two) times daily. Pt taking once or twice daily   Yes [provider]   Allergies  Allergen Reactions   Aspirin Rash, Other (See Comments) and Hives    Reaction: Trouble breathing    Review of Systems  Physical Exam  Vital Signs: BP (!) 148/113 (BP Location: Left Arm)   Pulse 64   Temp (!) 97.5 F (36.4 C)   Resp 12   Ht 5'  11" (1.803 m)   Wt 106.9 kg   SpO2 (!) 71%   BMI 32.87 kg/m  Pain Scale: 0-10 POSS *See Group Information*: 1-Acceptable,Awake and  alert Pain Score: 0-No pain   SpO2: SpO2: (!) 71 % O2 Device:SpO2: (!) 71 % O2 Flow Rate: .   IO: Intake/output summary:  Intake/Output Summary (Last 24 hours) at 03/01/2022 1008 Last data filed at 03/01/2022 0500 Gross per 24 hour  Intake --  Output 900 ml  Net -900 ml    LBM:   Baseline Weight: Weight: 106.1 kg Most recent weight: Weight: 106.9 kg     Palliative Assessment/Data: 40 % at best today    Discussed with Dr Alison Murray and TOC   Time In:  1200 Time Out: 1330 Time Total: 90 minutes Greater than 50%  of this time was spent counseling and coordinating care related to the above assessment and plan.  Signed by: Lorinda Creed, NP   Please contact Palliative Medicine Team phone at [phone] for questions and concerns.  For individual provider: See Loretha Stapler

## 2022-03-01 NOTE — Care Management Important Message (Signed)
Important Message  Patient Details  Name: [name] MRN: [id number] Date of Birth: 1930/12/18   Medicare Important Message Given:  Yes  Patient asleep upon time of visit.  Copy of Medicare IM left in patient's room for reference.    Dannette Barbara 03/01/2022, 12:28 PM

## 2022-03-01 NOTE — Progress Notes (Signed)
Progress Note   PatientTaeveon Keesling Bublitz DGU:[id number] DOB: 1931/05/09 DOA: 02/24/2022     4 DOS: the patient was seen and examined on 03/01/2022   Brief hospital course: Seymore Candler is a 88 y.o. male with medical history significant of HFpEF, lymphedema, severe aortic stenosis, hypertension, hyperlipidemia, presented to hospital after sustaining a ground-level fall. Patient predominantly sits in his reclining chair at home and often sleeps there as he prefers that over walking to his bed.  Patient was noted to have slumped down the chair and was on the floor.  She was unable to lift him off the floor, and due to this EMS was called.  In the ED, vitals were stable.  Initial lab work remarkable for potassium of 5.2.  CK elevated at 2934.  BNP elevated at 647.  Urinalysis with positive hemoglobin and RBCs, in addition to trace leukocytes and WBCs, however no bacteria seen. CT head obtained with no evidence of acute intracranial abnormality.  CT abdomen pelvis obtained with evidence of edema and skin thickening of the inferior gluteal region and infrarenal abdominal aortic aneurysm measuring 5.3 cm.  Bilateral hip/pelvis x-ray negative.  Chest x-ray with chronic changes but no acute cardiopulmonary disease.  Given given need for PT/OT evaluation and work-up for AKI, TRH was contacted for admission.  10/14; MRI showed a small acute lacunar infarct in the left parietal white matter and questionable additional punctate infarcts in left occipital pole.  MRA of head and neck with no large vessel occlusion.  A 4 mm left P-comm origin aneurysm. Neurology was consulted. Stroke work-up completed.  Lipid panel mostly unremarkable, LDL of 66, BMP with some improvement of potassium to 3.4, magnesium normal, CK with some improvement to 1027(peaked at 3124), urine cultures negative. PT/OT are recommending SNF. Echocardiogram with normal EF, indeterminate diastolic function, no obvious source of cardiac thrombi or atrial  septal shunting.  Also noted to have severe aortic stenosis.  Patient sees Dr. Mariah Milling as an outpatient and most likely is not a candidate for any surgical intervention. Neurology saw him and he was started on Plavix along with statin.  Allergic to aspirin.  We will continue monitoring him on telemetry to rule out any atrial fibrillation.  Per chart review on arrival to ED patient was very uncapped and roaches and bedbugs were found on him.  APS was notified.   10/15: Patient with overnight agitated, this morning he pulled his IV and telemetry wires and does not let them replaced. CK normalized this morning and BMP without any significant abnormality today.   Patient is currently full code.  High risk for deterioration and mortality based on age, advanced dementia and underlying comorbidities.  Palliative care was consulted. Neurology is recommending ZIO monitor placement before discharge.  10/16: Patient continues to remain very confused somnolent, not following commands, palliative consult pending.   Assessment and Plan: * Acute lacunar stroke [facility]) MRI with concern of acute lacunar stroke. Completed stroke work-up. Neurology is on board and advising to continue with Plavix, aspirin allergies noted. Start statin as rhabdomyolysis has been resolved. -PT/OT are recommending SNF -TOC to find a place -Neurology is recommending ZIO monitor placement before discharge -Patient is having significant altered mental status, baseline is currently not clear.  Patient is also extremely somnolent return if this is adding to the confusion of and is not able to follow commands and is not having any p.o. intake.  Currently palliative consult has been placed pending recommendations. -Also awaiting discussion with the patient's  family members to understand the patient's baseline.  Aneurysm ([place]) 32mm L pcomm aneurysm was found on MRA of head and neck.  Given patient old age and poor functional status at  baseline, neurology is not recommending any intervention at this time.  Fall Patient presenting to the ED after sliding down his chair and falling onto the ground with a height of less than 1 foot.  Patient cannot recall slumping down, but states he has been feeling at his baseline.  Patient's daughter states patient frequently sleeps on the chair and occasionally struggles to get up to use his walker when he needs to use the restroom.  She suspects that is what occurred as he did have a bowel movement while in the floor.   -PT/OT recommending SNF.  Rhabdomyolysis Resolved CK peaked at 3124, now normalized. Patient received IV fluid   Acute kidney injury ([place]) Resolved Creatinine on presentation elevated at 1.54.  Last BMP obtained approximately 1.5 years ago at which time, creatinine was 0.96.  Patient had restarted torsemide this week. Renal ultrasound was negative for any significant abnormality.  Bilateral echogenic kidneys with benign cyst. Renal function improved with IV fluid most likely secondary to dehydration and rhabdomyolysis. -Monitor renal function -Avoid nephrotoxins  Somnolence Resolved. Most likely secondary to metabolic encephalopathy on admission.  Sinus arrhythmia Telemetry with bradycardia with heart rate around 58, however EKG with normal heart rate.  Initial EKG consistent with sinus rhythm.  Repeat EKG personally reviewed, most consistent with sinus rhythm with frequent sinus pauses although it is being read as atrial fibrillation. Home metoprolol was held due to concern of bradycardia.  Current heart rate in 60s. -Current continue holding metoprolol and monitor  Aortic valve stenosis, severe Patient has a history of severe aortic valve stenosis with no repeat echo since 2019.  Which was also confirmed on repeat echo yesterday. Patient follow-up with Dr. Youth worker), most likely will not be a candidate for any surgical intervention. -Continue  outpatient follow-up  (HFpEF) heart failure with preserved ejection fraction [facility]) Patient has a history of HFpEF likely secondary to severe aortic stenosis.   Also has an history of significant lower extremity lymphedema.  BNP elevated at 647. Patient also received IV fluid for rhabdomyolysis -Monitor volume status carefully  Lymphedema Chronic issue and is being managed as outpatient. - Can consider Unna boots  Generalized weakness In the setting of extremely sedentary lifestyle secondary to lymphedema.  Patient would likely benefit from rehabilitation. PT/OT are recommending SNF  Diabetes mellitus ([place]) Per chart review, patient has a history of diabetes mellitus, however he is not currently on any antiglycemic agents.  CBGs within goal at this time. A1c of 4.7 -CBG checked were discontinued  Coronary artery disease, non-occlusive - Restart home Plavix -Holding statin until rhabdomyolysis resolved.  Hypertension Per chart review, patient has a history of hypertension that is currently being treated with metoprolol only.  Holding metoprolol at this time due to bradycardia.  He is normotensive at this time.     Subjective: Patient was seen and examined today.  Oriented to self only.  Some reported agitation earlier.  Currently pretty calm as step daughter was at bedside.  Physical Exam: Vitals:   02/28/22 0800 02/28/22 1903 03/01/22 0550 03/01/22 0808  BP: (!) 138/49 (!) 153/50 (!) 156/43 (!) 148/113  Pulse: 76 98 67 64  Resp: 18 20 20 12   Temp: 98.7 F (37.1 C) 98.5 F (36.9 C) 98.6 F (37 C) (!) 97.5 F (36.4 C)  TempSrc: Oral Oral Oral   SpO2: 100% 100% 100% (!) 71%  Weight:   106.9 kg   Height:       General.  Frail elderly man, in no acute distress. Pulmonary.  Lungs clear bilaterally, normal respiratory effort. CV.  Regular rate and rhythm, no JVD, rub or murmur. Abdomen.  Soft, nontender, nondistended, BS positive. CNS.  Alert and oriented .  No focal  neurologic deficit. Extremities.  Bilateral lower extremity lymphedema, right more than left with signs of chronic venous congestion and scaly skin Psychiatry.  Judgment and insight appears impaired  Data Reviewed: Prior data reviewed  Family Communication: Discussed with stepdaughter at bedside.  Disposition: Status is: Inpatient Remains inpatient appropriate because: Severity of illness   Planned Discharge Destination: Skilled nursing facility  Time spent: 40 minutes  This record has been created using Conservation officer, historic buildings. Errors have been sought and corrected,but may not always be located. Such creation errors do not reflect on the standard of care.  Author: Harold Hedge, MD 03/01/2022 2:00 PM  For on call review [email or web address].

## 2022-03-01 NOTE — TOC Progression Note (Deleted)
Transition of Care [facility]) - Progression Note    Patient Details  Name: [name] MRN: [id number] Date of Birth: 07-07-1930  Transition of Care [facility]) CM/SW Contact  Beverly Sessions, RN Phone Number: 03/01/2022, 2:39 PM  Clinical Narrative:     TOC still following for disposition  [facility]   Expected Discharge Plan: [place] Barriers to Discharge: Continued Medical Work up  Expected Discharge Plan and Services Expected Discharge Plan: [place] Acute Care Choice: Resumption of Svcs/PTA Provider Living arrangements for the past 2 months: Single Family Home                                       Social Determinants of Health (SDOH) Interventions    Readmission Risk Interventions     No data to display

## 2022-03-01 NOTE — Progress Notes (Signed)
Physical Therapy Treatment Patient Details Name: [name] MRN: [id number] DOB: May 07, 1931 Today's Date: 03/01/2022   History of Present Illness presented to ER from home s/p ground-level fall; admitted for management of AKI, rhabdomyolysis.  Imaging to date negative for acute orthopedic injury.    PT Comments    Per chart review, noted with acute infarct on MRI (small acute L parietal).  Patient generally lethargic, with limited active effort/participation with session. Requires consistent cuing for attention to therapist, constant encouragement for participation as able.  Bilat LE ROM remains globally limited by pain, tolerating only 20-30 degrees of passive movement at bilat hips/knees, limited by pain.  Unable to localize, simple stating "hurts all over" Total assist +2 for all rolling, repositioning in bed; unsafe/unable to tolerate any further mobility progression.   Recommendations for follow up therapy are one component of a multi-disciplinary discharge planning process, led by the attending physician.  Recommendations may be updated based on patient status, additional functional criteria and insurance authorization.  Follow Up Recommendations  Skilled nursing-short term rehab (<3 hours/[place]) Can patient physically be transported by private vehicle: No   Assistance Recommended at Discharge Frequent or constant Supervision/Assistance  Patient can return home with the following Two people to help with walking and/or transfers;Two people to help with bathing/dressing/bathroom;Direct supervision/assist for medications management;Direct supervision/assist for financial management;Assist for transportation;Help with stairs or ramp for entrance;Assistance with cooking/housework   Equipment Recommendations  Hospital bed (hoyer lift, [facility])    Recommendations for Other Services       Precautions / Restrictions Precautions Precautions: Fall Restrictions Weight Bearing Restrictions: No      Mobility  Bed Mobility Overal bed mobility: Needs Assistance Bed Mobility: Rolling Rolling: Total assist, +2 for physical assistance         General bed mobility comments: very limited/no ability to actively assist with mobility/repositioning efforts; intermittently resists movement (due to pain)    Transfers                   General transfer comment: unsafe/unable    Ambulation/Gait               General Gait Details: unsafe/unable   Stairs             Wheelchair Mobility    Modified Rankin (Stroke Patients Only)       Balance                                            Cognition Arousal/Alertness: Lethargic Behavior During Therapy: Flat affect Overall Cognitive Status: No family/caregiver present to determine baseline cognitive functioning                                          Exercises Other Exercises Other Exercises: Supine LE therex, 1x10, passive ROM: ankle pumps, hip/knee flexion/extension, hip abduct/adduct.  Poor tolerance for any LE movement due to pain Other Exercises: Rolling bilat, total assist +2, for repositioning and pressure-relief in bed    General Comments        Pertinent Vitals/Pain Pain Assessment Pain Assessment: Faces Faces Pain Scale: Hurts whole lot Pain Location: "hurts all over" Pain Descriptors / Indicators: Aching, Grimacing, Guarding, Moaning Pain Intervention(s): Limited activity within patient's tolerance, Monitored during session, Repositioned  Home Living                          Prior Function            PT Goals (current goals can now be found in the care plan section) Acute Rehab PT Goals PT Goal Formulation: Patient unable to participate in goal setting Time For Goal Achievement: 03/11/22 Potential to Achieve Goals: Fair Additional Goals Additional Goal #1: Assess and establish goals for OOB activities/mobility as  appropriate. Progress towards PT goals: Not progressing toward goals - comment    Frequency    Min 2X/week      PT Plan Current plan remains appropriate    Co-evaluation              AM-PAC PT "6 Clicks" Mobility   Outcome Measure  Help needed turning from your back to your side while in a flat bed without using bedrails?: Total Help needed moving from lying on your back to sitting on the side of a flat bed without using bedrails?: Total Help needed moving to and from a bed to a chair (including a wheelchair)?: Total Help needed standing up from a chair using your arms (e.g., wheelchair or bedside chair)?: Total Help needed to walk in hospital room?: Total Help needed climbing 3-5 steps with a railing? : Total 6 Click Score: 6    End of Session   Activity Tolerance: Patient limited by pain Patient left: in bed;with call bell/phone within reach;with bed alarm set Nurse Communication: Mobility status PT Visit Diagnosis: Muscle weakness (generalized) (M62.81);Difficulty in walking, not elsewhere classified (R26.2);History of falling (Z91.81)     Time: 6503-5465 PT Time Calculation (min) (ACUTE ONLY): 18 min  Charges:  $Therapeutic Activity: 8-22 mins                     Vaughn Beaumier H. Owens Shark, PT, DPT, NCS 03/01/22, 3:54 PM [phone]

## 2022-03-01 NOTE — Consult Note (Signed)
[facility] Nurse Consult Note: Reason for Consult:Unna's boots Wound type:NA Patient with bilateral lymphedema, needs trained lymphedema therapist and long term management. However based on notes and appearance of the toe nails and feet not sure patient would have the capacity to keep up with the needed appts. No open wounds, patient therefore could be done at the [facility] rehab center  Lymphedema  Resources (updated 03/2021) Each site requires a referral from your primary care MD [facility] [address], [place]  [phone] (Upper extremities)  [place], [place] [phone] (Lower extremities, PATIENT CAN NOT HAVE A WOUND)  [place] [place], [place] [address] [phone]  [place], [address] Medical Office Building [place], [place] [phone]  [facility] [address] [place], [place] [address] [phone]  [place] [facility]  (only treatment for lymphedema related to cancer diagnosis) [place], [place] [address] [phone]    [facility], [place] [address] [phone] [facility] [place] [phone], [place] [address] [phone]   Change Unna's boots weekly,  [place], [place], [place]

## 2022-03-02 DIAGNOSIS — I10 Essential (primary) hypertension: Secondary | ICD-10-CM | POA: Diagnosis not present

## 2022-03-02 DIAGNOSIS — Z87891 Personal history of nicotine dependence: Secondary | ICD-10-CM | POA: Diagnosis not present

## 2022-03-02 DIAGNOSIS — D696 Thrombocytopenia, unspecified: Secondary | ICD-10-CM | POA: Diagnosis present

## 2022-03-02 DIAGNOSIS — Z743 Need for continuous supervision: Secondary | ICD-10-CM | POA: Diagnosis not present

## 2022-03-02 DIAGNOSIS — S31809D Unspecified open wound of unspecified buttock, subsequent encounter: Secondary | ICD-10-CM | POA: Diagnosis not present

## 2022-03-02 DIAGNOSIS — Z741 Need for assistance with personal care: Secondary | ICD-10-CM | POA: Diagnosis not present

## 2022-03-02 DIAGNOSIS — I6381 Other cerebral infarction due to occlusion or stenosis of small artery: Secondary | ICD-10-CM | POA: Diagnosis not present

## 2022-03-02 DIAGNOSIS — G9341 Metabolic encephalopathy: Secondary | ICD-10-CM | POA: Diagnosis present

## 2022-03-02 DIAGNOSIS — N179 Acute kidney failure, unspecified: Secondary | ICD-10-CM | POA: Diagnosis not present

## 2022-03-02 DIAGNOSIS — I700 Atherosclerosis of aorta: Secondary | ICD-10-CM | POA: Diagnosis present

## 2022-03-02 DIAGNOSIS — I890 Lymphedema, not elsewhere classified: Secondary | ICD-10-CM | POA: Diagnosis not present

## 2022-03-02 DIAGNOSIS — R6521 Severe sepsis with septic shock: Secondary | ICD-10-CM | POA: Diagnosis present

## 2022-03-02 DIAGNOSIS — I080 Rheumatic disorders of both mitral and aortic valves: Secondary | ICD-10-CM | POA: Diagnosis present

## 2022-03-02 DIAGNOSIS — A4159 Other Gram-negative sepsis: Secondary | ICD-10-CM | POA: Diagnosis present

## 2022-03-02 DIAGNOSIS — T68XXXA Hypothermia, initial encounter: Secondary | ICD-10-CM | POA: Diagnosis not present

## 2022-03-02 DIAGNOSIS — M6282 Rhabdomyolysis: Secondary | ICD-10-CM | POA: Diagnosis not present

## 2022-03-02 DIAGNOSIS — B379 Candidiasis, unspecified: Secondary | ICD-10-CM | POA: Diagnosis not present

## 2022-03-02 DIAGNOSIS — J45998 Other asthma: Secondary | ICD-10-CM | POA: Diagnosis not present

## 2022-03-02 DIAGNOSIS — R41841 Cognitive communication deficit: Secondary | ICD-10-CM | POA: Diagnosis not present

## 2022-03-02 DIAGNOSIS — E785 Hyperlipidemia, unspecified: Secondary | ICD-10-CM | POA: Diagnosis present

## 2022-03-02 DIAGNOSIS — R001 Bradycardia, unspecified: Secondary | ICD-10-CM | POA: Diagnosis not present

## 2022-03-02 DIAGNOSIS — B961 Klebsiella pneumoniae [K. pneumoniae] as the cause of diseases classified elsewhere: Secondary | ICD-10-CM | POA: Diagnosis present

## 2022-03-02 DIAGNOSIS — E162 Hypoglycemia, unspecified: Secondary | ICD-10-CM | POA: Diagnosis present

## 2022-03-02 DIAGNOSIS — R4189 Other symptoms and signs involving cognitive functions and awareness: Secondary | ICD-10-CM | POA: Diagnosis not present

## 2022-03-02 DIAGNOSIS — I639 Cerebral infarction, unspecified: Secondary | ICD-10-CM | POA: Diagnosis not present

## 2022-03-02 DIAGNOSIS — R2681 Unsteadiness on feet: Secondary | ICD-10-CM | POA: Diagnosis not present

## 2022-03-02 DIAGNOSIS — N289 Disorder of kidney and ureter, unspecified: Secondary | ICD-10-CM | POA: Diagnosis not present

## 2022-03-02 DIAGNOSIS — R279 Unspecified lack of coordination: Secondary | ICD-10-CM | POA: Diagnosis not present

## 2022-03-02 DIAGNOSIS — J811 Chronic pulmonary edema: Secondary | ICD-10-CM | POA: Diagnosis not present

## 2022-03-02 DIAGNOSIS — G9340 Encephalopathy, unspecified: Secondary | ICD-10-CM | POA: Diagnosis not present

## 2022-03-02 DIAGNOSIS — E43 Unspecified severe protein-calorie malnutrition: Secondary | ICD-10-CM | POA: Diagnosis not present

## 2022-03-02 DIAGNOSIS — I5022 Chronic systolic (congestive) heart failure: Secondary | ICD-10-CM | POA: Diagnosis not present

## 2022-03-02 DIAGNOSIS — R4182 Altered mental status, unspecified: Secondary | ICD-10-CM | POA: Diagnosis not present

## 2022-03-02 DIAGNOSIS — E669 Obesity, unspecified: Secondary | ICD-10-CM | POA: Diagnosis present

## 2022-03-02 DIAGNOSIS — R000 Tachycardia, unspecified: Secondary | ICD-10-CM | POA: Diagnosis not present

## 2022-03-02 DIAGNOSIS — I350 Nonrheumatic aortic (valve) stenosis: Secondary | ICD-10-CM | POA: Diagnosis not present

## 2022-03-02 DIAGNOSIS — Z736 Limitation of activities due to disability: Secondary | ICD-10-CM | POA: Diagnosis not present

## 2022-03-02 DIAGNOSIS — R531 Weakness: Secondary | ICD-10-CM | POA: Diagnosis not present

## 2022-03-02 DIAGNOSIS — R6520 Severe sepsis without septic shock: Secondary | ICD-10-CM | POA: Diagnosis not present

## 2022-03-02 DIAGNOSIS — F32A Depression, unspecified: Secondary | ICD-10-CM | POA: Diagnosis present

## 2022-03-02 DIAGNOSIS — N170 Acute kidney failure with tubular necrosis: Secondary | ICD-10-CM | POA: Diagnosis not present

## 2022-03-02 DIAGNOSIS — A4189 Other specified sepsis: Secondary | ICD-10-CM | POA: Diagnosis not present

## 2022-03-02 DIAGNOSIS — K219 Gastro-esophageal reflux disease without esophagitis: Secondary | ICD-10-CM | POA: Diagnosis not present

## 2022-03-02 DIAGNOSIS — U071 COVID-19: Secondary | ICD-10-CM | POA: Diagnosis present

## 2022-03-02 DIAGNOSIS — R319 Hematuria, unspecified: Secondary | ICD-10-CM | POA: Diagnosis not present

## 2022-03-02 DIAGNOSIS — A419 Sepsis, unspecified organism: Secondary | ICD-10-CM | POA: Diagnosis not present

## 2022-03-02 DIAGNOSIS — T83518A Infection and inflammatory reaction due to other urinary catheter, initial encounter: Secondary | ICD-10-CM | POA: Diagnosis present

## 2022-03-02 DIAGNOSIS — I5032 Chronic diastolic (congestive) heart failure: Secondary | ICD-10-CM | POA: Diagnosis not present

## 2022-03-02 DIAGNOSIS — I671 Cerebral aneurysm, nonruptured: Secondary | ICD-10-CM | POA: Diagnosis not present

## 2022-03-02 DIAGNOSIS — M1990 Unspecified osteoarthritis, unspecified site: Secondary | ICD-10-CM | POA: Diagnosis not present

## 2022-03-02 DIAGNOSIS — M6281 Muscle weakness (generalized): Secondary | ICD-10-CM | POA: Diagnosis not present

## 2022-03-02 DIAGNOSIS — N390 Urinary tract infection, site not specified: Secondary | ICD-10-CM | POA: Diagnosis not present

## 2022-03-02 DIAGNOSIS — M6259 Muscle wasting and atrophy, not elsewhere classified, multiple sites: Secondary | ICD-10-CM | POA: Diagnosis not present

## 2022-03-02 DIAGNOSIS — E870 Hyperosmolality and hypernatremia: Secondary | ICD-10-CM | POA: Diagnosis not present

## 2022-03-02 DIAGNOSIS — Z515 Encounter for palliative care: Secondary | ICD-10-CM | POA: Diagnosis not present

## 2022-03-02 DIAGNOSIS — I110 Hypertensive heart disease with heart failure: Secondary | ICD-10-CM | POA: Diagnosis present

## 2022-03-02 DIAGNOSIS — R17 Unspecified jaundice: Secondary | ICD-10-CM | POA: Diagnosis present

## 2022-03-02 DIAGNOSIS — R262 Difficulty in walking, not elsewhere classified: Secondary | ICD-10-CM | POA: Diagnosis not present

## 2022-03-02 DIAGNOSIS — I959 Hypotension, unspecified: Secondary | ICD-10-CM | POA: Diagnosis not present

## 2022-03-02 DIAGNOSIS — Y846 Urinary catheterization as the cause of abnormal reaction of the patient, or of later complication, without mention of misadventure at the time of the procedure: Secondary | ICD-10-CM | POA: Diagnosis present

## 2022-03-02 DIAGNOSIS — J45909 Unspecified asthma, uncomplicated: Secondary | ICD-10-CM | POA: Diagnosis present

## 2022-03-02 DIAGNOSIS — I2510 Atherosclerotic heart disease of native coronary artery without angina pectoris: Secondary | ICD-10-CM | POA: Diagnosis present

## 2022-03-02 DIAGNOSIS — N3090 Cystitis, unspecified without hematuria: Secondary | ICD-10-CM | POA: Diagnosis present

## 2022-03-02 DIAGNOSIS — I5033 Acute on chronic diastolic (congestive) heart failure: Secondary | ICD-10-CM | POA: Diagnosis present

## 2022-03-02 DIAGNOSIS — Z7189 Other specified counseling: Secondary | ICD-10-CM | POA: Diagnosis not present

## 2022-03-02 DIAGNOSIS — I729 Aneurysm of unspecified site: Secondary | ICD-10-CM | POA: Diagnosis not present

## 2022-03-02 DIAGNOSIS — J9811 Atelectasis: Secondary | ICD-10-CM | POA: Diagnosis not present

## 2022-03-02 DIAGNOSIS — Z66 Do not resuscitate: Secondary | ICD-10-CM | POA: Diagnosis not present

## 2022-03-02 LAB — COMPREHENSIVE METABOLIC PANEL
ALT: 20 U/L (ref 0–44)
AST: 33 U/L (ref 15–41)
Albumin: 2 g/dL — ABNORMAL LOW (ref 3.5–5.0)
Alkaline Phosphatase: 51 U/L (ref 38–126)
Anion gap: 5 (ref 5–15)
BUN: 14 mg/dL (ref 8–23)
CO2: 26 mmol/L (ref 22–32)
Calcium: 8.1 mg/dL — ABNORMAL LOW (ref 8.9–10.3)
Chloride: 108 mmol/L (ref 98–111)
Creatinine, Ser: 0.74 mg/dL (ref 0.61–1.24)
GFR, Estimated: >60 mL/min (ref >60)
Glucose, Bld: 75 mg/dL (ref 70–99)
Potassium: 4.2 mmol/L (ref 3.5–5.1)
Sodium: 139 mmol/L (ref 135–145)
Total Bilirubin: 1.8 mg/dL — ABNORMAL HIGH (ref 0.3–1.2)
Total Protein: 4.7 g/dL — ABNORMAL LOW (ref 6.5–8.1)

## 2022-03-02 LAB — CBC WITH DIFFERENTIAL/PLATELET
Abs Immature Granulocytes: 0.01 10*3/uL (ref 0.00–0.07)
Basophils Absolute: 0.1 10*3/uL (ref 0.0–0.1)
Basophils Relative: 1 %
Eosinophils Absolute: 0.4 10*3/uL (ref 0.0–0.5)
Eosinophils Relative: 7 %
HCT: 38.9 % — ABNORMAL LOW (ref 39.0–52.0)
Hemoglobin: 12.7 g/dL — ABNORMAL LOW (ref 13.0–17.0)
Immature Granulocytes: 0 %
Lymphocytes Relative: 25 %
Lymphs Abs: 1.4 10*3/uL (ref 0.7–4.0)
MCH: 27.5 pg (ref 26.0–34.0)
MCHC: 32.6 g/dL (ref 30.0–36.0)
MCV: 84.2 fL (ref 80.0–100.0)
Monocytes Absolute: 0.4 10*3/uL (ref 0.1–1.0)
Monocytes Relative: 8 %
Neutro Abs: 3.3 10*3/uL (ref 1.7–7.7)
Neutrophils Relative %: 59 %
Platelets: 139 10*3/uL — ABNORMAL LOW (ref 150–400)
RBC: 4.62 MIL/uL (ref 4.22–5.81)
RDW: 15.9 % — ABNORMAL HIGH (ref 11.5–15.5)
WBC: 5.5 10*3/uL (ref 4.0–10.5)
nRBC: 0 % (ref 0.0–0.2)

## 2022-03-02 LAB — TSH: TSH: 1.991 u[IU]/mL (ref 0.350–4.500)

## 2022-03-02 LAB — CBC
HCT: 38.9 % — ABNORMAL LOW (ref 39.0–52.0)
Hemoglobin: 12.7 g/dL — ABNORMAL LOW (ref 13.0–17.0)
MCH: 27.8 pg (ref 26.0–34.0)
MCHC: 32.6 g/dL (ref 30.0–36.0)
MCV: 85.1 fL (ref 80.0–100.0)
Platelets: 128 10*3/uL — ABNORMAL LOW (ref 150–400)
RBC: 4.57 MIL/uL (ref 4.22–5.81)
RDW: 15.6 % — ABNORMAL HIGH (ref 11.5–15.5)
WBC: 5.2 10*3/uL (ref 4.0–10.5)
nRBC: 0 % (ref 0.0–0.2)

## 2022-03-02 LAB — AMMONIA: Ammonia: 19 umol/L (ref 9–35)

## 2022-03-02 MED ORDER — ADULT MULTIVITAMIN W/MINERALS CH: 1.0000 | ORAL_TABLET | Freq: Every day | ORAL | 0 refills | Status: AC

## 2022-03-02 MED ORDER — VITAMIN B-1 100 MG PO TABS: 100.0000 mg | ORAL_TABLET | Freq: Every day | ORAL | 0 refills | Status: AC

## 2022-03-02 MED ORDER — QUETIAPINE FUMARATE 25 MG PO TABS: 25.0000 mg | ORAL_TABLET | Freq: Every day | ORAL | 0 refills | Status: AC

## 2022-03-02 MED ORDER — ROSUVASTATIN CALCIUM 20 MG PO TABS: 20.0000 mg | ORAL_TABLET | Freq: Every day | ORAL | 0 refills | Status: AC

## 2022-03-02 MED ORDER — FOLIC ACID 1 MG PO TABS: 1.0000 mg | ORAL_TABLET | Freq: Every day | ORAL | 0 refills | Status: AC

## 2022-03-02 NOTE — Consult Note (Signed)
   [facility] CM Inpatient Consult   03/02/2022  Izaiah Scibilia [birth year] [id number]  [employer] [ACO] Patient: Medicare [facility] Liaison remote coverage for [facility] for review of disposition needs  Primary Care Provider:  Leone Haven, MD, [place] at [facility] is listed to provide the Transition of Care follow up  Review of inpatient [facility] team and PT/OT notes for post hospital recommendations noted in progress notes.  Patient is recommended for a skilled nursing facility level of care, notes patient/family may need to pursue LTC options.  If the patient goes to a [facility] affiliated facility then, patient can be followed by [facility] PAC RN with traditional Medicare and approved Medicare Advantage plans.  Notes patient also had been active with palliative care PTA.Marland Kitchen   Plan:   [facility] PAC RN can follow for any known or needs for transitional care needs for returning to post facility care or complex disease management.   For questions or referrals, please contact:   Natividad Brood, RN BSN [place]  [phone] business mobile phone Toll free office [phone]  *[place]  [phone] Fax number: [phone] [email or web address]

## 2022-03-02 NOTE — Discharge Summary (Signed)
Physician Discharge Summary   Patient: [name] MRN: [id number] DOB: [birth year]  Admit date:     02/24/2022  Discharge date: 03/02/22  Discharge Physician: Harold Hedge   PCP: Glori Luis, MD   Recommendations at discharge:   Follow-up with PCP in 1 week Follow-up with neurologist in 3 to 4 weeks  Discharge Diagnoses: Principal Problem:   Acute lacunar stroke [facility]) Active Problems:   Fall   Aneurysm (HCC)   Acute kidney injury (HCC)   Rhabdomyolysis   Somnolence   Sinus arrhythmia   Aortic valve stenosis, severe   (HFpEF) heart failure with preserved ejection fraction (HCC)   Lymphedema   Generalized weakness   Diabetes mellitus (HCC)   Coronary artery disease, non-occlusive   Hypertension   Pressure injury of skin   Hospital Course: [name] is a 88 y.o. male with medical history significant of HFpEF, lymphedema, severe aortic stenosis, hypertension, hyperlipidemia, presented to hospital after sustaining a ground-level fall. Patient predominantly sits in his reclining chair at home and often sleeps there as he prefers that over walking to his bed.  Patient was noted to have slumped down the chair and was on the floor.  She was unable to lift him off the floor, and due to this EMS was called.  In the ED, vitals were stable.  Initial lab work remarkable for potassium of 5.2.  CK elevated at 2934.  BNP elevated at 647.  Urinalysis with positive hemoglobin and RBCs, in addition to trace leukocytes and WBCs, however no bacteria seen. CT head obtained with no evidence of acute intracranial abnormality.  CT abdomen pelvis obtained with evidence of edema and skin thickening of the inferior gluteal region and infrarenal abdominal aortic aneurysm measuring 5.3 cm.  Bilateral hip/pelvis x-ray negative.  Chest x-ray with chronic changes but no acute cardiopulmonary disease.  Given given need for PT/OT evaluation and work-up for AKI, [facility] was contacted for admission.  Patient  was admitted by the hospitalist team, evaluated by neurologist.  Patient underwent an MRI.MRI showed a small acute lacunar infarct in the left parietal white matter and questionable additional punctate infarcts in left occipital pole.  MRA of head and neck with no large vessel occlusion.  A 4 mm left P-comm origin aneurysm. Neurology was consulted. Stroke work-up completed.  Lipid panel mostly unremarkable, LDL of 66, BMP with some improvement of potassium to 3.4, magnesium normal, CK with some improvement to 1027(peaked at 3124), urine cultures negative.  Patient was treated with IV fluids statins were withheld.  Subsequently acute kidney injury has resolved.  Echocardiogram with normal EF, indeterminate diastolic function, no obvious source of cardiac thrombi or atrial septal shunting.  Also noted to have severe aortic stenosis.  Patient sees Dr. Mariah Milling as an outpatient and most likely is not a candidate for any surgical intervention. Neurology saw him and he was started on Plavix along with statin.  Allergic to aspirin.  On telemetry patient had sinus arrhythmia, A-fib was ruled out.  Beta-blocker was held.  Currently bradycardia has resolved heart rate is in the 70s discharged on metoprolol 30s Toprol-XL.  Patient also takes torsemide 20 mg p.o. twice daily which she has been discontinued.  Patient had significant altered mental status with significant somnolence.  This is resolved patient was alert and able to answer questions and participate in the care he was able to also eat his food and take his medications.PT/OT are recommending SNF.  Patient is currently being discharged to skilled nursing  facility. Per chart review on arrival to ED patient was very uncapped and roaches and bedbugs were found on him.  [facility] was notified.            Consultants: Neurologist Procedures performed: None Disposition: Skilled nursing facility Diet recommendation:  Discharge Diet Orders (From admission, onward)      Start     Ordered   03/02/22 0000  Diet - low sodium heart healthy        03/02/22 1504           Cardiac diet DISCHARGE MEDICATION: Allergies as of 03/02/2022       Reactions   Aspirin Rash, Other (See Comments), Hives   Reaction: Trouble breathing        Medication List     STOP taking these medications    torsemide 20 MG tablet Commonly known as: DEMADEX       TAKE these medications    clopidogrel 75 MG tablet Commonly known as: PLAVIX TAKE 1 TABLET(75 MG) BY MOUTH DAILY What changed: See the new instructions.   folic acid 1 MG tablet Commonly known as: FOLVITE Take 1 tablet (1 mg total) by mouth daily. Start taking on: March 03, 2022   metoprolol succinate 25 MG 24 hr tablet Commonly known as: TOPROL-XL TAKE 1 TABLET(25 MG) BY MOUTH DAILY What changed: See the new instructions.   multivitamin with minerals Tabs tablet Take 1 tablet by mouth daily. Start taking on: March 03, 2022   QUEtiapine 25 MG tablet Commonly known as: SEROQUEL Take 1 tablet (25 mg total) by mouth at bedtime.   rosuvastatin 20 MG tablet Commonly known as: CRESTOR Take 1 tablet (20 mg total) by mouth daily. Start taking on: March 03, 2022   thiamine 100 MG tablet Commonly known as: Vitamin B-1 Take 1 tablet (100 mg total) by mouth daily. Start taking on: March 03, 2022               Discharge Care Instructions  (From admission, onward)           Start     Ordered   03/02/22 0000  Discharge wound care:       Comments: As described above   03/02/22 1504            Contact information for follow-up providers     Glori Luis, MD Follow up in 1 week(s).   Specialty: Family Medicine Contact information: [address] [place] [address] [phone]              Contact information for after-discharge care     Destination     [facility] Preferred SNF .   Service: Skilled  Nursing Contact information: [address] [place] [address] [phone]                    Discharge Exam: Ceasar Mons Weights   02/25/22 0735 03/01/22 0550 03/02/22 0500  Weight: 106.1 kg 106.9 kg 107 kg   Patient was seen and examined bedside today. Patient is alert and oriented able to answer questions appropriately CVS: S1-S2 positive Respiratory: Bilateral clear and equal breath sounds.  Condition at discharge: fair  The results of significant diagnostics from this hospitalization (including imaging, microbiology, ancillary and laboratory) are listed below for reference.   Imaging Studies: MR ANGIO HEAD WO CONTRAST  Result Date: 02/27/2022 CLINICAL DATA:  Acute neurologic deficit EXAM: MRA NECK WITHOUT AND WITH CONTRAST MRA HEAD  WITHOUT CONTRAST TECHNIQUE: Multiplanar and multiecho pulse sequences of the neck were obtained without and with intravenous contrast. Angiographic images of the neck were obtained using MRA technique without and with intravenous contrast; Angiographic images of the Circle of Willis were obtained using MRA technique without intravenous contrast. CONTRAST:  63mL GADAVIST GADOBUTROL 1 MMOL/ML IV SOLN COMPARISON:  None Available. FINDINGS: MRA NECK FINDINGS Aortic arch: Normal. Right carotid system: Normal. Left carotid system: Mild undulation along the course of the mid to distal internal carotid artery. Vertebral arteries: Left dominant.  No occlusion or dissection. MRA HEAD FINDINGS POSTERIOR CIRCULATION: --Vertebral arteries: Normal --Inferior cerebellar arteries: Normal. --Basilar artery: Normal. --Superior cerebellar arteries: Normal. --Posterior cerebral arteries: Normal. The right PCA is predominantly supplied by the posterior communicating artery. ANTERIOR CIRCULATION: --Intracranial internal carotid arteries: There is a posteriorly projecting aneurysm at the left P-comm origin site measuring 4 mm. Internal carotid arteries are  otherwise normal. --Anterior cerebral arteries (ACA): Normal. Hypoplastic right A1 segment, normal variant. --Middle cerebral arteries (MCA): Normal. IMPRESSION: MRA HEAD: 1. No emergent large vessel occlusion or high-grade stenosis. 2. A 4 mm left P-comm origin aneurysm. MRA NECK: Mild undulation along the course of the mid to distal left internal carotid artery, which may indicate fibromuscular dysplasia. Electronically Signed   By: Ulyses Jarred M.D.   On: 02/27/2022 02:56   MR ANGIO NECK W WO CONTRAST  Result Date: 02/27/2022 CLINICAL DATA:  Acute neurologic deficit EXAM: MRA NECK WITHOUT AND WITH CONTRAST MRA HEAD WITHOUT CONTRAST TECHNIQUE: Multiplanar and multiecho pulse sequences of the neck were obtained without and with intravenous contrast. Angiographic images of the neck were obtained using MRA technique without and with intravenous contrast; Angiographic images of the Circle of Willis were obtained using MRA technique without intravenous contrast. CONTRAST:  29mL GADAVIST GADOBUTROL 1 MMOL/ML IV SOLN COMPARISON:  None Available. FINDINGS: MRA NECK FINDINGS Aortic arch: Normal. Right carotid system: Normal. Left carotid system: Mild undulation along the course of the mid to distal internal carotid artery. Vertebral arteries: Left dominant.  No occlusion or dissection. MRA HEAD FINDINGS POSTERIOR CIRCULATION: --Vertebral arteries: Normal --Inferior cerebellar arteries: Normal. --Basilar artery: Normal. --Superior cerebellar arteries: Normal. --Posterior cerebral arteries: Normal. The right PCA is predominantly supplied by the posterior communicating artery. ANTERIOR CIRCULATION: --Intracranial internal carotid arteries: There is a posteriorly projecting aneurysm at the left P-comm origin site measuring 4 mm. Internal carotid arteries are otherwise normal. --Anterior cerebral arteries (ACA): Normal. Hypoplastic right A1 segment, normal variant. --Middle cerebral arteries (MCA): Normal. IMPRESSION: MRA  HEAD: 1. No emergent large vessel occlusion or high-grade stenosis. 2. A 4 mm left P-comm origin aneurysm. MRA NECK: Mild undulation along the course of the mid to distal left internal carotid artery, which may indicate fibromuscular dysplasia. Electronically Signed   By: Ulyses Jarred M.D.   On: 02/27/2022 02:56   ECHOCARDIOGRAM COMPLETE  Result Date: 02/26/2022    ECHOCARDIOGRAM REPORT   Patient Name:   [name] Date of Exam: 02/26/2022 Medical Rec #:  [id number]  Height:       71.0 in Accession #:    [id number] Weight:       234.0 lb Date of Birth:  Nov 24, 1930  BSA:          2.254 m Patient Age:    49 years   BP:           121/54 mmHg Patient Gender: M          HR:  40 bpm. Exam Location:  [facility] Procedure: 2D Echo, Cardiac Doppler and Color Doppler Indications:     Aortic stenosis I35.0  History:         Patient has prior history of Echocardiogram examinations, most                  recent 03/18/2018. Signs/Symptoms:Murmur; Risk                  Factors:Hypertension. Severe Aortic stenosis.  Sonographer:     Cristela Blue Referring Phys:  [id number] Verdene Lennert Diagnosing Phys: Julien Nordmann MD IMPRESSIONS  1. Left ventricular ejection fraction, by estimation, is 55 to 60%. The left ventricle has normal function. The left ventricle has no regional wall motion abnormalities. There is moderate left ventricular hypertrophy. Left ventricular diastolic parameters are indeterminate.  2. Right ventricular systolic function is normal. The right ventricular size is normal. There is normal pulmonary artery systolic pressure. The estimated right ventricular systolic pressure is 35.0 mmHg.  3. Left atrial size was mildly dilated.  4. Moderate pleural effusion in the left lateral region.  5. The mitral valve is normal in structure. Moderate mitral valve regurgitation. No evidence of mitral stenosis.  6. The aortic valve is normal in structure. There is severe calcifcation of the aortic valve. Aortic valve  regurgitation is moderate. Severe aortic valve stenosis. Aortic valve area, by VTI measures 0.89 cm. Aortic valve mean gradient measures 39.7 mmHg. Aortic valve Vmax measures 4.28 m/s.  7. The inferior vena cava is normal in size with greater than 50% respiratory variability, suggesting right atrial pressure of 3 mmHg. FINDINGS  Left Ventricle: Left ventricular ejection fraction, by estimation, is 55 to 60%. The left ventricle has normal function. The left ventricle has no regional wall motion abnormalities. The left ventricular internal cavity size was normal in size. There is  moderate left ventricular hypertrophy. Left ventricular diastolic parameters are indeterminate. Right Ventricle: The right ventricular size is normal. No increase in right ventricular wall thickness. Right ventricular systolic function is normal. There is normal pulmonary artery systolic pressure. The tricuspid regurgitant velocity is 2.74 m/s, and  with an assumed right atrial pressure of 5 mmHg, the estimated right ventricular systolic pressure is 35.0 mmHg. Left Atrium: Left atrial size was mildly dilated. Right Atrium: Right atrial size was normal in size. Pericardium: There is no evidence of pericardial effusion. Mitral Valve: The mitral valve is normal in structure. There is mild calcification of the mitral valve leaflet(s). Moderate mitral valve regurgitation. No evidence of mitral valve stenosis. Tricuspid Valve: The tricuspid valve is normal in structure. Tricuspid valve regurgitation is not demonstrated. No evidence of tricuspid stenosis. Aortic Valve: The aortic valve is normal in structure. There is severe calcifcation of the aortic valve. Aortic valve regurgitation is moderate. Severe aortic stenosis is present. Aortic valve mean gradient measures 39.7 mmHg. Aortic valve peak gradient measures 73.4 mmHg. Aortic valve area, by VTI measures 0.89 cm. Pulmonic Valve: The pulmonic valve was normal in structure. Pulmonic valve  regurgitation is not visualized. No evidence of pulmonic stenosis. Aorta: The aortic root is normal in size and structure. Venous: The inferior vena cava is normal in size with greater than 50% respiratory variability, suggesting right atrial pressure of 3 mmHg. IAS/Shunts: No atrial level shunt detected by color flow Doppler. Additional Comments: There is a moderate pleural effusion in the left lateral region.  LEFT VENTRICLE PLAX 2D LVIDd:         5.30 cm LVIDs:  3.40 cm LV PW:         1.20 cm LV IVS:        1.60 cm LVOT diam:     2.00 cm LV SV:         84 LV SV Index:   37 LVOT Area:     3.14 cm  RIGHT VENTRICLE RV Basal diam:  5.00 cm RV Mid diam:    5.00 cm RV S prime:     16.40 cm/s TAPSE (M-mode): 3.9 cm LEFT ATRIUM             Index        RIGHT ATRIUM           Index LA diam:        4.40 cm 1.95 cm/m   RA Area:     20.60 cm LA Vol (A2C):   47.1 ml 20.90 ml/m  RA Volume:   54.90 ml  24.36 ml/m LA Vol (A4C):   34.5 ml 15.31 ml/m LA Biplane Vol: 41.0 ml 18.19 ml/m  AORTIC VALVE AV Area (Vmax):    0.94 cm AV Area (Vmean):   0.92 cm AV Area (VTI):     0.89 cm AV Vmax:           428.33 cm/s AV Vmean:          290.333 cm/s AV VTI:            0.943 m AV Peak Grad:      73.4 mmHg AV Mean Grad:      39.7 mmHg LVOT Vmax:         128.00 cm/s LVOT Vmean:        85.300 cm/s LVOT VTI:          0.268 m LVOT/AV VTI ratio: 0.28  AORTA Ao Root diam: 3.50 cm MITRAL VALVE                TRICUSPID VALVE MV Area (PHT): 5.34 cm     TR Peak grad:   30.0 mmHg MV Decel Time: 142 msec     TR Vmax:        274.00 cm/s MV E velocity: 113.00 cm/s                             SHUNTS                             Systemic VTI:  0.27 m                             Systemic Diam: 2.00 cm Julien Nordmann MD Electronically signed by Julien Nordmann MD Signature Date/Time: 02/26/2022/10:11:47 AM    Final    MR BRAIN WO CONTRAST  Result Date: 02/25/2022 CLINICAL DATA:  88 year old male with altered mental status.  Fall. EXAM: MRI  HEAD WITHOUT CONTRAST TECHNIQUE: Multiplanar, multiecho pulse sequences of the brain and surrounding structures were obtained without intravenous contrast. COMPARISON:  Head CT yesterday. FINDINGS: Study is mildly degraded by motion artifact despite repeated imaging attempts. Brain: Small linear restricted diffusion in the left periatrial white matter series 5, image 25. Mild acute and underlying patchy chronic white matter T2 and FLAIR hyperintensity there. Possible tiny additional focus of restricted diffusion in the left occipital pole versus artifact (series 5, image 17). Mild for age other mostly  periventricular white matter T2 and FLAIR hyperintensity. Mild T2 heterogeneity in the bilateral basal ganglia. Thalami, brainstem and cerebellum appear negative. No cortical encephalomalacia identified. No other restricted diffusion. No midline shift, mass effect, evidence of mass lesion, ventriculomegaly, extra-axial collection or acute intracranial hemorrhage. Cervicomedullary junction and pituitary are within normal limits. No chronic cerebral blood products. Vascular: Major intracranial vascular flow voids are preserved. Intracranial artery tortuosity. Distal left vertebral artery appears dominant. Skull and upper cervical spine: Negative for age visible cervical spine. Visualized bone marrow signal is within normal limits. Sinuses/Orbits: Negative orbits. Previous paranasal sinus surgery with residual sinus mucosal thickening and trace fluid. Other: Mastoid air cells are well aerated. Grossly negative visible internal auditory structures. Negative visible scalp and face. IMPRESSION: 1. Small acute lacunar infarct in the left periatrial white matter. And questionable additional punctate infarct in the left occipital pole. No associated hemorrhage or mass effect. 2. Underlying mild for age chronic small vessel disease. Electronically Signed   By: Odessa Fleming M.D.   On: 02/25/2022 11:10   US RENAL  Result Date:  02/25/2022 CLINICAL DATA:  Acute kidney injury EXAM: RENAL / URINARY TRACT ULTRASOUND COMPLETE COMPARISON:  02/24/2022 CT abdomen/pelvis. FINDINGS: Right Kidney: Renal measurements: 10.7 x 6.2 x 6.2 cm = volume: 215 mL. No hydronephrosis. Minimally complex 3.0 x 2.0 x 2.4 cm lower right renal cyst with thin internal septation. Exophytic simple 3.1 x 2.7 x 2.8 cm lateral interpolar right renal cyst and simple 3.6 x 2.3 x 2.3 cm lower right renal cyst. Background renal parenchyma is echogenic and normal thickness. Left Kidney: Renal measurements: 10.6 x 6.1 x 5.1 cm = volume: 174 mL. No hydronephrosis. Simple 2.4 x 1.6 x 2.1 cm upper left renal cyst. Exophytic simple 1.2 x 1.2 x 1.0 cm upper left renal cyst. Simple parapelvic lower left renal 1.6 cm cyst. Background renal parenchyma is echogenic and normal thickness. Bladder: Collapsed by indwelling Foley catheter, precluding assessment. Other: None. IMPRESSION: 1. No hydronephrosis. 2. Echogenic normal size kidneys bilaterally, compatible with nonspecific acute renal parenchymal disease. 3. Benign appearing bilateral renal cysts, requiring no further imaging follow-up. 4. Bladder is collapsed by indwelling Foley catheter, precluding assessment. Electronically Signed   By: Delbert Phenix M.D.   On: 02/25/2022 09:16   CT ABDOMEN PELVIS WO CONTRAST  Result Date: 02/24/2022 CLINICAL DATA:  Fall pain EXAM: CT ABDOMEN AND PELVIS WITHOUT CONTRAST TECHNIQUE: Multidetector CT imaging of the abdomen and pelvis was performed following the standard protocol without IV contrast. RADIATION DOSE REDUCTION: This exam was performed according to the departmental dose-optimization program which includes automated exposure control, adjustment of the mA and/or kV according to patient size and/or use of iterative reconstruction technique. COMPARISON:  Ultrasound 02/14/2020, CT report 09/21/2023 FINDINGS: Lower chest: Lung bases demonstrate no acute airspace disease. Coronary vascular  calcification. Cardiomegaly. Hepatobiliary: No focal liver abnormality is seen. Status post cholecystectomy. No biliary dilatation. Pancreas: Unremarkable. No pancreatic ductal dilatation or surrounding inflammatory changes. Spleen: Normal in size without focal abnormality. Adrenals/Urinary Tract: Adrenal glands are normal. Multiple low-density lesions within the bilateral kidneys are likely cysts but limited assessment without contrast, no specific imaging follow-up is recommended. No hydronephrosis. The bladder is unremarkable Stomach/Bowel: Stomach is within normal limits. Appendix appears normal. No evidence of bowel wall thickening, distention, or inflammatory changes. Vascular/Lymphatic: Moderate aortic atherosclerosis. Aneurysmal dilatation of the distal infrarenal abdominal aorta at the bifurcation, this measures 5.3 cm. Aneurysmal dilatation of the left greater than right common iliac arteries. Left common iliac  artery measures 4.6 cm, right common iliac artery measures 2.4 cm. No evidence for stranding or retroperitoneal hematoma. 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 Diffusely ectatic right internal iliac artery. Reproductive: Heterogeneous prostate calcification without discrete mass Other: Negative for pelvic effusion or free air. Musculoskeletal: Edema and skin thickening within the inferior gluteal region and posterior aspects of the upper thighs with soft tissue infiltration. Ankylosis across the L5-S1 disc space. No definite acute osseous abnormality IMPRESSION: 1. No CT evidence for acute intra-abdominal or pelvic abnormality. 2. Edema and skin thickening within the inferior gluteal region and upper aspects of the thighs posteriorly, this could be secondary to dependent edema, trauma, or infection, correlate with direct inspection 3. Infrarenal abdominal aortic aneurysm measuring 5.3 cm. Recommend follow-up CT/MR every 6 months and vascular consultation. This recommendation  follows ACR consensus guidelines: White Paper of the ACR Incidental Findings Committee II on Vascular Findings. J Am Coll Radiol 2013; 10:789-794. aneurysmal dilatation of the bilateral common iliac arteries, left greater than right with aneurysm of left internal iliac artery. Negative for Peri aortic stranding or retroperitoneal hematoma Electronically Signed   By: Jasmine PangKim  Fujinaga M.D.   On: 02/24/2022 17:02   DG Chest 1 View  Result Date: 02/24/2022 CLINICAL DATA:  Weakness and leg swelling. EXAM: CHEST  1 VIEW COMPARISON:  Chest radiograph 03/17/2018 FINDINGS: Chronic but progressive elevation of right hemidiaphragm. The heart is enlarged. No focal airspace disease, pulmonary edema, pleural effusion or pneumothorax. Gaseous distention of bowel loops in the upper abdomen is partially visualized. IMPRESSION: 1. Chronic but progressive elevation of right hemidiaphragm. 2. Cardiomegaly. 3. Partially visualized gaseous distention of bowel loops in the upper abdomen, nonspecific. Electronically Signed   By: Narda RutherfordMelanie  Sanford M.D.   On: 02/24/2022 16:24   DG Hip Unilat W or Wo Pelvis 2-3 Views Right  Result Date: 02/24/2022 CLINICAL DATA:  Weakness with leg swelling. EXAM: DG HIP (WITH OR WITHOUT PELVIS) 2-3V RIGHT COMPARISON:  None Available. FINDINGS: No acute fracture. No dislocation. Mild symmetric bilateral hip joint space narrowing with acetabular spurring. No erosions, avascular necrosis or evidence of focal bone lesion. No bony destructive change. Intact pubic rami. Pubic symphysis and sacroiliac joints are congruent. No focal soft tissue abnormalities are seen. IMPRESSION: No acute findings.  Mild symmetric bilateral hip osteoarthritis. Electronically Signed   By: Narda RutherfordMelanie  Sanford M.D.   On: 02/24/2022 16:22   US Venous Img Lower Bilateral  Result Date: 02/24/2022 CLINICAL DATA:  Bilateral lower extremity pain and edema for the past 4 years. Evaluate for DVT. EXAM: BILATERAL LOWER EXTREMITY VENOUS  DOPPLER ULTRASOUND TECHNIQUE: Gray-scale sonography with graded compression, as well as color Doppler and duplex ultrasound were performed to evaluate the lower extremity deep venous systems from the level of the common femoral vein and including the common femoral, femoral, profunda femoral, popliteal and calf veins including the posterior tibial, peroneal and gastrocnemius veins when visible. The superficial great saphenous vein was also interrogated. Spectral Doppler was utilized to evaluate flow at rest and with distal augmentation maneuvers in the common femoral, femoral and popliteal veins. COMPARISON:  Right lower extremity venous Doppler ultrasound-12/03/2019 (negative); 12/19/2015 (negative; 05/08/2015 (negative); 03/26/2015 (negative); 06/01/2008 (negative) Bilateral lower extremity venous Doppler ultrasound-03/17/2018 (negative); 08/06/2017 (negative) FINDINGS: Examination is degraded due to patient body habitus and poor sonographic window RIGHT LOWER EXTREMITY Common Femoral Vein: No evidence of thrombus. Normal compressibility, respiratory phasicity and response to augmentation. Saphenofemoral Junction: No evidence of thrombus. Normal compressibility and flow on  color Doppler imaging. Profunda Femoral Vein: No evidence of thrombus. Normal compressibility and flow on color Doppler imaging. Femoral Vein: No evidence of thrombus. Normal compressibility, respiratory phasicity and response to augmentation. Popliteal Vein: No evidence of thrombus. Normal compressibility, respiratory phasicity and response to augmentation. Calf Veins: Appear patent where visualized. Superficial Great Saphenous Vein: No evidence of thrombus. Normal compressibility. Other Findings: There is a minimal amount of subcutaneous edema at the level of the right calf. LEFT LOWER EXTREMITY Common Femoral Vein: No evidence of thrombus. Normal compressibility, respiratory phasicity and response to augmentation. Saphenofemoral Junction: No  evidence of thrombus. Normal compressibility and flow on color Doppler imaging. Profunda Femoral Vein: No evidence of thrombus. Normal compressibility and flow on color Doppler imaging. Femoral Vein: No evidence of thrombus. Normal compressibility, respiratory phasicity and response to augmentation. Popliteal Vein: No evidence of thrombus. Normal compressibility, respiratory phasicity and response to augmentation. Calf Veins: Appear patent where visualized. Superficial Great Saphenous Vein: No evidence of thrombus. Normal compressibility. Other Findings:  None. IMPRESSION: No evidence of DVT within either lower extremity. Electronically Signed   By: Simonne Come M.D.   On: 02/24/2022 16:10   CT Head Wo Contrast  Result Date: 02/24/2022 CLINICAL DATA:  Altered mental status. EXAM: CT HEAD WITHOUT CONTRAST TECHNIQUE: Contiguous axial images were obtained from the base of the skull through the vertex without intravenous contrast. RADIATION DOSE REDUCTION: This exam was performed according to the departmental dose-optimization program which includes automated exposure control, adjustment of the mA and/or kV according to patient size and/or use of iterative reconstruction technique. COMPARISON:  Head CT 08/05/2017 FINDINGS: Brain: No evidence of acute infarction, hemorrhage, hydrocephalus, extra-axial collection or mass lesion/mass effect. Age related atrophy. Periventricular chronic small vessel ischemia with slight progression. Vascular: Atherosclerosis of skullbase vasculature without hyperdense vessel or abnormal calcification. Skull: No fracture or focal lesion. Sinuses/Orbits: Postsurgical change of the paranasal sinuses. No mastoid effusion. Other: Mild right-sided scalp edema. No confluent hematoma. IMPRESSION: 1. No acute intracranial abnormality. 2. Age related atrophy and chronic small vessel ischemia. Electronically Signed   By: Narda Rutherford M.D.   On: 02/24/2022 15:11    Microbiology: Results for  orders placed or performed during the hospital encounter of 02/24/22  Resp Panel by RT-PCR (Flu A&B, Covid) Anterior Nasal Swab     Status: None   Collection Time: 02/24/22  4:42 PM   Specimen: Anterior Nasal Swab  Result Value Ref Range Status   SARS Coronavirus 2 by RT PCR NEGATIVE NEGATIVE Final    Comment: (NOTE) SARS-CoV-2 target nucleic acids are NOT DETECTED.  The SARS-CoV-2 RNA is generally detectable in upper respiratory specimens during the acute phase of infection. The lowest concentration of SARS-CoV-2 viral copies this assay can detect is 138 copies/mL. A negative result does not preclude SARS-Cov-2 infection and should not be used as the sole basis for treatment or other patient management decisions. A negative result may occur with  improper specimen collection/handling, submission of specimen other than nasopharyngeal swab, presence of viral mutation(s) within the areas targeted by this assay, and inadequate number of viral copies(<138 copies/mL). A negative result must be combined with clinical observations, patient history, and epidemiological information. The expected result is Negative.  Fact Sheet for Patients:  BloggerCourse.com  Fact Sheet for Healthcare Providers:  SeriousBroker.it  This test is no t yet approved or cleared by the [place] FDA and  has been authorized for detection and/or diagnosis of SARS-CoV-2 by FDA under an Emergency Use Authorization (  EUA). This EUA will remain  in effect (meaning this test can be used) for the duration of the COVID-19 declaration under Section 564(b)(1) of the Act, 21 U.S.C.section 360bbb-3(b)(1), unless the authorization is terminated  or revoked sooner.       Influenza A by PCR NEGATIVE NEGATIVE Final   Influenza B by PCR NEGATIVE NEGATIVE Final    Comment: (NOTE) The Xpert Xpress SARS-CoV-2/FLU/RSV plus assay is intended as an aid in the diagnosis of  influenza from Nasopharyngeal swab specimens and should not be used as a sole basis for treatment. Nasal washings and aspirates are unacceptable for Xpert Xpress SARS-CoV-2/FLU/RSV testing.  Fact Sheet for Patients: BloggerCourse.com  Fact Sheet for Healthcare Providers: SeriousBroker.it  This test is not yet approved or cleared by the [place] FDA and has been authorized for detection and/or diagnosis of SARS-CoV-2 by FDA under an Emergency Use Authorization (EUA). This EUA will remain in effect (meaning this test can be used) for the duration of the COVID-19 declaration under Section 564(b)(1) of the Act, 21 U.S.C. section 360bbb-3(b)(1), unless the authorization is terminated or revoked.  Performed at [facility], [address], [place], [place] [address]   Urine Culture     Status: None   Collection Time: 02/24/22  5:56 PM   Specimen: Urine, Clean Catch  Result Value Ref Range Status   Specimen Description   Final    URINE, CLEAN CATCH Performed at [facility], [address] [place], [place] [address]    Special Requests   Final    NONE Performed at [facility], [address] [place], [place] [address]    Culture   Final    NO GROWTH Performed at [facility], [address] [place], [place] [address]    Report Status 02/26/2022 FINAL  Final    Labs: CBC: Recent Labs  Lab 02/24/22 1730 02/25/22 8469 02/26/22 0618 02/27/22 0546 03/02/22 0402 03/02/22 0909  WBC 10.2 7.3 6.0 5.3 5.2 5.5  NEUTROABS 8.4* 5.5  --   --   --  3.3  HGB 14.3 13.1 11.9* 11.8* 12.7* 12.7*  HCT 43.6 39.3 36.7* 36.2* 38.9* 38.9*  MCV 84.2 84.7 85.3 84.8 85.1 84.2  PLT 156 118* 132* 128* 128* 139*   Basic Metabolic Panel: Recent Labs  Lab 02/25/22 0220 02/25/22 0350 02/26/22 0618 02/27/22 0546 02/28/22 0457 03/02/22 0909  NA 144  --  143 141 141 139  K 2.9*  --  3.1* 3.4* 3.8 4.2   CL 107  --  110 111 108 108  CO2 28  --  GLUCOSE 91  --  85 81 85 75  BUN 36*  --  24* CREATININE 1.22  --  0.89 0.76 0.80 0.74  CALCIUM 8.3*  --  7.7* 7.6* 7.7* 8.1*  MG  --  2.3 2.2 2.3  --   --    Liver Function Tests: Recent Labs  Lab 02/24/22 1510 02/25/22 0220 03/02/22 0909  AST 124* 120* 33  ALT 30 36 20  ALKPHOS 68 64 51  BILITOT 3.7* 2.8* 1.8*  PROT 6.3* 5.6* 4.7*  ALBUMIN 3.0* 2.6* 2.0*   CBG: Recent Labs  Lab 02/24/22 1957  GLUCAP 87    Discharge time spent: greater than 30 minutes.  Signed: Harold Hedge, MD [facility] 03/02/2022

## 2022-03-02 NOTE — TOC Progression Note (Signed)
Transition of Care [facility]) - Progression Note    Patient Details  Name: Alim Cattell MRN: [id number] Date of Birth: 11-03-1930  Transition of Care [facility]) CM/SW Contact  Beverly Sessions, RN Phone Number: 03/02/2022, 2:44 PM  Clinical Narrative:     Notified by palliative that patient's brother is next of kin.  Daughter Mariann Laster listed is actually patient's step daughter Damaris Schooner with brother and he confirmed that he wishes for placement.  His goal would be for long term placement. I have informed him that patient does not have a payor for long term. And at this time he would have coverage benefits for short term rehab only and then would have to return home with step daughter.   With this being the case he wanted Mariann Laster to be part of the decision making.  3 way call was placed with Mariann Laster and brother [name].  Bed offers were presented.  They are in agreement to accept bed [facility].  [facility] with [facility] notified.  Mariann Laster in agreement for him to discharge back home with her after completion of rehab   [place] supervisor was notified of above information.  She states that if the case was accepted she would pass on to the following Education officer, museum   Expected Discharge Plan: [place] Barriers to Discharge: Continued Medical Work up  Expected Discharge Plan and Services Expected Discharge Plan: [place] Acute Care Choice: Resumption of Svcs/PTA Provider Living arrangements for the past 2 months: Single Family Home                                       Social Determinants of Health (SDOH) Interventions    Readmission Risk Interventions     No data to display

## 2022-03-02 NOTE — TOC Transition Note (Signed)
Transition of Care [facility]) - CM/SW Discharge Note   Patient Details  Name: Wolf Mcphie MRN: [id number] Date of Birth: 01/18/1931  Transition of Care [facility]) CM/SW Contact:  Beverly Sessions, RN Phone Number: 03/02/2022, 4:17 PM   Clinical Narrative:     Patient will DC BF:[facility]  Anticipated DC date: 03/02/22 Family notified:Brother, step daughter, secure email sent to [place]  Transport by:  Per MD patient ready for DC to . RN, patient's family, and facility notified of DC. Discharge Summary sent to facility. RN given number for report. EMS packet printed, nurse to obtain from printer.  Signed DNR on chart. Ambulance transport requested for patient.  TOC signing off.  Isaias Cowman [facility] [phone]     Barriers to Discharge: Continued Medical Work up   Patient Goals and CMS Choice Patient states their goals for this hospitalization and ongoing recovery are:: Pt disoriented and unable to participate in goal setting. CMS Medicare.gov Compare Post Acute Care list provided to:: Patient Represenative (must comment) Choice offered to / list presented to : Adult Children  Discharge Placement                       Discharge Plan and Services     Post Acute Care Choice: Resumption of Svcs/PTA Provider                               Social Determinants of Health (SDOH) Interventions     Readmission Risk Interventions     No data to display

## 2022-03-03 ENCOUNTER — Telehealth: Payer: Self-pay

## 2022-03-03 ENCOUNTER — Other Ambulatory Visit: Payer: Self-pay | Admitting: *Deleted

## 2022-03-03 DIAGNOSIS — M6281 Muscle weakness (generalized): Secondary | ICD-10-CM | POA: Diagnosis not present

## 2022-03-03 DIAGNOSIS — I639 Cerebral infarction, unspecified: Secondary | ICD-10-CM | POA: Diagnosis not present

## 2022-03-03 DIAGNOSIS — I890 Lymphedema, not elsewhere classified: Secondary | ICD-10-CM | POA: Diagnosis not present

## 2022-03-03 DIAGNOSIS — R001 Bradycardia, unspecified: Secondary | ICD-10-CM | POA: Diagnosis not present

## 2022-03-03 NOTE — Telephone Encounter (Signed)
Transition Care Management Unsuccessful Follow-up Telephone Call  Date of discharge and from where:  [place] 03/02/2022  Attempts:  1st Attempt  Reason for unsuccessful TCM follow-up call:  Left voice message Juanda Crumble, [place] Direct Dial [phone]

## 2022-03-03 NOTE — Patient Outreach (Signed)
[name] recently admitted to [facility] on 03/02/22 per [facility]. Screening for potential [facility] care coordination services as benefit of insurance plan and PCP.   Will follow while [name] resides in SNF.   [name], MSN, RN,BSN [place] Acute Care Coordinator [phone] (Direct dial)

## 2022-03-04 DIAGNOSIS — R001 Bradycardia, unspecified: Secondary | ICD-10-CM | POA: Diagnosis not present

## 2022-03-04 DIAGNOSIS — I890 Lymphedema, not elsewhere classified: Secondary | ICD-10-CM | POA: Diagnosis not present

## 2022-03-04 DIAGNOSIS — M6281 Muscle weakness (generalized): Secondary | ICD-10-CM | POA: Diagnosis not present

## 2022-03-04 DIAGNOSIS — I639 Cerebral infarction, unspecified: Secondary | ICD-10-CM | POA: Diagnosis not present

## 2022-03-04 NOTE — Telephone Encounter (Signed)
Disregard TCM patient in rehab facility Juanda Crumble, [place] Direct Dial [phone]

## 2022-03-08 DIAGNOSIS — E43 Unspecified severe protein-calorie malnutrition: Secondary | ICD-10-CM | POA: Diagnosis not present

## 2022-03-08 DIAGNOSIS — R4189 Other symptoms and signs involving cognitive functions and awareness: Secondary | ICD-10-CM | POA: Diagnosis not present

## 2022-03-08 DIAGNOSIS — R001 Bradycardia, unspecified: Secondary | ICD-10-CM | POA: Diagnosis not present

## 2022-03-11 DIAGNOSIS — I639 Cerebral infarction, unspecified: Secondary | ICD-10-CM | POA: Diagnosis not present

## 2022-03-11 DIAGNOSIS — B379 Candidiasis, unspecified: Secondary | ICD-10-CM | POA: Diagnosis not present

## 2022-03-11 DIAGNOSIS — R001 Bradycardia, unspecified: Secondary | ICD-10-CM | POA: Diagnosis not present

## 2022-03-11 DIAGNOSIS — S31809D Unspecified open wound of unspecified buttock, subsequent encounter: Secondary | ICD-10-CM | POA: Diagnosis not present

## 2022-03-12 DIAGNOSIS — B379 Candidiasis, unspecified: Secondary | ICD-10-CM | POA: Diagnosis not present

## 2022-03-12 DIAGNOSIS — S31809D Unspecified open wound of unspecified buttock, subsequent encounter: Secondary | ICD-10-CM | POA: Diagnosis not present

## 2022-03-12 DIAGNOSIS — M6281 Muscle weakness (generalized): Secondary | ICD-10-CM | POA: Diagnosis not present

## 2022-03-12 DIAGNOSIS — E43 Unspecified severe protein-calorie malnutrition: Secondary | ICD-10-CM | POA: Diagnosis not present

## 2022-03-16 DIAGNOSIS — I10 Essential (primary) hypertension: Secondary | ICD-10-CM | POA: Diagnosis not present

## 2022-03-16 DIAGNOSIS — R319 Hematuria, unspecified: Secondary | ICD-10-CM | POA: Diagnosis not present

## 2022-03-18 DIAGNOSIS — M6281 Muscle weakness (generalized): Secondary | ICD-10-CM | POA: Diagnosis not present

## 2022-03-18 DIAGNOSIS — R319 Hematuria, unspecified: Secondary | ICD-10-CM | POA: Diagnosis not present

## 2022-03-22 DIAGNOSIS — R319 Hematuria, unspecified: Secondary | ICD-10-CM | POA: Diagnosis not present

## 2022-03-22 DIAGNOSIS — I10 Essential (primary) hypertension: Secondary | ICD-10-CM | POA: Diagnosis not present

## 2022-03-22 DIAGNOSIS — M6281 Muscle weakness (generalized): Secondary | ICD-10-CM | POA: Diagnosis not present

## 2022-03-23 DIAGNOSIS — I639 Cerebral infarction, unspecified: Secondary | ICD-10-CM | POA: Diagnosis not present

## 2022-03-23 DIAGNOSIS — R531 Weakness: Secondary | ICD-10-CM | POA: Diagnosis not present

## 2022-03-23 DIAGNOSIS — R4182 Altered mental status, unspecified: Secondary | ICD-10-CM | POA: Diagnosis not present

## 2022-03-23 DIAGNOSIS — R262 Difficulty in walking, not elsewhere classified: Secondary | ICD-10-CM | POA: Diagnosis not present

## 2022-03-23 DIAGNOSIS — I671 Cerebral aneurysm, nonruptured: Secondary | ICD-10-CM | POA: Diagnosis not present

## 2022-03-24 ENCOUNTER — Emergency Department: Payer: Medicare Other

## 2022-03-24 ENCOUNTER — Inpatient Hospital Stay: Payer: Medicare Other

## 2022-03-24 ENCOUNTER — Encounter: Payer: Self-pay | Admitting: *Deleted

## 2022-03-24 ENCOUNTER — Inpatient Hospital Stay
Admission: EM | Admit: 2022-03-24 | Discharge: 2022-04-16 | DRG: 871 | Disposition: E | Payer: Medicare Other | Source: Skilled Nursing Facility | Attending: Internal Medicine | Admitting: Internal Medicine

## 2022-03-24 ENCOUNTER — Other Ambulatory Visit: Payer: Self-pay

## 2022-03-24 DIAGNOSIS — U071 COVID-19: Secondary | ICD-10-CM | POA: Diagnosis present

## 2022-03-24 DIAGNOSIS — G9341 Metabolic encephalopathy: Secondary | ICD-10-CM | POA: Diagnosis present

## 2022-03-24 DIAGNOSIS — I5032 Chronic diastolic (congestive) heart failure: Secondary | ICD-10-CM | POA: Diagnosis not present

## 2022-03-24 DIAGNOSIS — I110 Hypertensive heart disease with heart failure: Secondary | ICD-10-CM | POA: Diagnosis present

## 2022-03-24 DIAGNOSIS — E669 Obesity, unspecified: Secondary | ICD-10-CM | POA: Diagnosis not present

## 2022-03-24 DIAGNOSIS — Z66 Do not resuscitate: Secondary | ICD-10-CM | POA: Diagnosis not present

## 2022-03-24 DIAGNOSIS — R17 Unspecified jaundice: Secondary | ICD-10-CM | POA: Diagnosis not present

## 2022-03-24 DIAGNOSIS — B961 Klebsiella pneumoniae [K. pneumoniae] as the cause of diseases classified elsewhere: Secondary | ICD-10-CM | POA: Diagnosis present

## 2022-03-24 DIAGNOSIS — M7989 Other specified soft tissue disorders: Secondary | ICD-10-CM | POA: Diagnosis not present

## 2022-03-24 DIAGNOSIS — F32A Depression, unspecified: Secondary | ICD-10-CM | POA: Diagnosis present

## 2022-03-24 DIAGNOSIS — T83518A Infection and inflammatory reaction due to other urinary catheter, initial encounter: Secondary | ICD-10-CM | POA: Diagnosis present

## 2022-03-24 DIAGNOSIS — N390 Urinary tract infection, site not specified: Secondary | ICD-10-CM | POA: Diagnosis present

## 2022-03-24 DIAGNOSIS — I700 Atherosclerosis of aorta: Secondary | ICD-10-CM | POA: Diagnosis not present

## 2022-03-24 DIAGNOSIS — Z6835 Body mass index (BMI) 35.0-35.9, adult: Secondary | ICD-10-CM

## 2022-03-24 DIAGNOSIS — A419 Sepsis, unspecified organism: Secondary | ICD-10-CM | POA: Diagnosis not present

## 2022-03-24 DIAGNOSIS — N179 Acute kidney failure, unspecified: Secondary | ICD-10-CM | POA: Diagnosis not present

## 2022-03-24 DIAGNOSIS — Z9049 Acquired absence of other specified parts of digestive tract: Secondary | ICD-10-CM

## 2022-03-24 DIAGNOSIS — I2510 Atherosclerotic heart disease of native coronary artery without angina pectoris: Secondary | ICD-10-CM | POA: Diagnosis not present

## 2022-03-24 DIAGNOSIS — Z515 Encounter for palliative care: Secondary | ICD-10-CM | POA: Diagnosis not present

## 2022-03-24 DIAGNOSIS — G9340 Encephalopathy, unspecified: Secondary | ICD-10-CM | POA: Diagnosis not present

## 2022-03-24 DIAGNOSIS — R4182 Altered mental status, unspecified: Secondary | ICD-10-CM | POA: Diagnosis not present

## 2022-03-24 DIAGNOSIS — J9811 Atelectasis: Secondary | ICD-10-CM | POA: Diagnosis not present

## 2022-03-24 DIAGNOSIS — D696 Thrombocytopenia, unspecified: Secondary | ICD-10-CM | POA: Diagnosis present

## 2022-03-24 DIAGNOSIS — R6521 Severe sepsis with septic shock: Secondary | ICD-10-CM | POA: Diagnosis present

## 2022-03-24 DIAGNOSIS — E785 Hyperlipidemia, unspecified: Secondary | ICD-10-CM | POA: Diagnosis present

## 2022-03-24 DIAGNOSIS — N3090 Cystitis, unspecified without hematuria: Secondary | ICD-10-CM | POA: Diagnosis not present

## 2022-03-24 DIAGNOSIS — I890 Lymphedema, not elsewhere classified: Secondary | ICD-10-CM | POA: Diagnosis present

## 2022-03-24 DIAGNOSIS — E870 Hyperosmolality and hypernatremia: Secondary | ICD-10-CM | POA: Diagnosis not present

## 2022-03-24 DIAGNOSIS — I5033 Acute on chronic diastolic (congestive) heart failure: Secondary | ICD-10-CM | POA: Diagnosis present

## 2022-03-24 DIAGNOSIS — A4159 Other Gram-negative sepsis: Principal | ICD-10-CM | POA: Diagnosis present

## 2022-03-24 DIAGNOSIS — J811 Chronic pulmonary edema: Secondary | ICD-10-CM | POA: Diagnosis not present

## 2022-03-24 DIAGNOSIS — Z62890 Parent-child estrangement NEC: Secondary | ICD-10-CM

## 2022-03-24 DIAGNOSIS — M6281 Muscle weakness (generalized): Secondary | ICD-10-CM | POA: Diagnosis not present

## 2022-03-24 DIAGNOSIS — N170 Acute kidney failure with tubular necrosis: Secondary | ICD-10-CM | POA: Diagnosis not present

## 2022-03-24 DIAGNOSIS — Z634 Disappearance and death of family member: Secondary | ICD-10-CM

## 2022-03-24 DIAGNOSIS — J45909 Unspecified asthma, uncomplicated: Secondary | ICD-10-CM | POA: Diagnosis not present

## 2022-03-24 DIAGNOSIS — R6520 Severe sepsis without septic shock: Secondary | ICD-10-CM | POA: Diagnosis not present

## 2022-03-24 DIAGNOSIS — E878 Other disorders of electrolyte and fluid balance, not elsewhere classified: Secondary | ICD-10-CM | POA: Diagnosis not present

## 2022-03-24 DIAGNOSIS — I872 Venous insufficiency (chronic) (peripheral): Secondary | ICD-10-CM | POA: Diagnosis present

## 2022-03-24 DIAGNOSIS — N289 Disorder of kidney and ureter, unspecified: Secondary | ICD-10-CM | POA: Diagnosis not present

## 2022-03-24 DIAGNOSIS — I959 Hypotension, unspecified: Secondary | ICD-10-CM | POA: Diagnosis not present

## 2022-03-24 DIAGNOSIS — Z8719 Personal history of other diseases of the digestive system: Secondary | ICD-10-CM

## 2022-03-24 DIAGNOSIS — Z87891 Personal history of nicotine dependence: Secondary | ICD-10-CM | POA: Diagnosis not present

## 2022-03-24 DIAGNOSIS — I10 Essential (primary) hypertension: Secondary | ICD-10-CM | POA: Diagnosis present

## 2022-03-24 DIAGNOSIS — Z823 Family history of stroke: Secondary | ICD-10-CM

## 2022-03-24 DIAGNOSIS — E162 Hypoglycemia, unspecified: Secondary | ICD-10-CM | POA: Diagnosis present

## 2022-03-24 DIAGNOSIS — R000 Tachycardia, unspecified: Secondary | ICD-10-CM | POA: Diagnosis not present

## 2022-03-24 DIAGNOSIS — Y846 Urinary catheterization as the cause of abnormal reaction of the patient, or of later complication, without mention of misadventure at the time of the procedure: Secondary | ICD-10-CM | POA: Diagnosis present

## 2022-03-24 DIAGNOSIS — I080 Rheumatic disorders of both mitral and aortic valves: Secondary | ICD-10-CM | POA: Diagnosis not present

## 2022-03-24 DIAGNOSIS — Z7902 Long term (current) use of antithrombotics/antiplatelets: Secondary | ICD-10-CM

## 2022-03-24 DIAGNOSIS — Z79899 Other long term (current) drug therapy: Secondary | ICD-10-CM

## 2022-03-24 DIAGNOSIS — Z7189 Other specified counseling: Secondary | ICD-10-CM | POA: Diagnosis not present

## 2022-03-24 DIAGNOSIS — T68XXXA Hypothermia, initial encounter: Secondary | ICD-10-CM | POA: Diagnosis not present

## 2022-03-24 DIAGNOSIS — A4189 Other specified sepsis: Secondary | ICD-10-CM | POA: Diagnosis not present

## 2022-03-24 DIAGNOSIS — R531 Weakness: Secondary | ICD-10-CM | POA: Diagnosis not present

## 2022-03-24 DIAGNOSIS — Z8249 Family history of ischemic heart disease and other diseases of the circulatory system: Secondary | ICD-10-CM

## 2022-03-24 DIAGNOSIS — Z8673 Personal history of transient ischemic attack (TIA), and cerebral infarction without residual deficits: Secondary | ICD-10-CM

## 2022-03-24 LAB — COMPREHENSIVE METABOLIC PANEL
ALT: 29 U/L (ref 0–44)
AST: 65 U/L — ABNORMAL HIGH (ref 15–41)
Albumin: 2.6 g/dL — ABNORMAL LOW (ref 3.5–5.0)
Alkaline Phosphatase: 171 U/L — ABNORMAL HIGH (ref 38–126)
Anion gap: 11 (ref 5–15)
BUN: 59 mg/dL — ABNORMAL HIGH (ref 8–23)
CO2: 24 mmol/L (ref 22–32)
Calcium: 8.3 mg/dL — ABNORMAL LOW (ref 8.9–10.3)
Chloride: 108 mmol/L (ref 98–111)
Creatinine, Ser: 4.01 mg/dL — ABNORMAL HIGH (ref 0.61–1.24)
GFR, Estimated: 14 mL/min — ABNORMAL LOW (ref >60)
Glucose, Bld: 61 mg/dL — ABNORMAL LOW (ref 70–99)
Potassium: 4.3 mmol/L (ref 3.5–5.1)
Sodium: 143 mmol/L (ref 135–145)
Total Bilirubin: 3.1 mg/dL — ABNORMAL HIGH (ref 0.3–1.2)
Total Protein: 6.1 g/dL — ABNORMAL LOW (ref 6.5–8.1)

## 2022-03-24 LAB — CBC WITH DIFFERENTIAL/PLATELET
Abs Immature Granulocytes: 0.11 10*3/uL — ABNORMAL HIGH (ref 0.00–0.07)
Basophils Absolute: 0.1 10*3/uL (ref 0.0–0.1)
Basophils Relative: 0 %
Eosinophils Absolute: 0 10*3/uL (ref 0.0–0.5)
Eosinophils Relative: 0 %
HCT: 40.1 % (ref 39.0–52.0)
Hemoglobin: 13.1 g/dL (ref 13.0–17.0)
Immature Granulocytes: 1 %
Lymphocytes Relative: 3 %
Lymphs Abs: 0.4 10*3/uL — ABNORMAL LOW (ref 0.7–4.0)
MCH: 27.2 pg (ref 26.0–34.0)
MCHC: 32.7 g/dL (ref 30.0–36.0)
MCV: 83.2 fL (ref 80.0–100.0)
Monocytes Absolute: 0.3 10*3/uL (ref 0.1–1.0)
Monocytes Relative: 2 %
Neutro Abs: 12.6 10*3/uL — ABNORMAL HIGH (ref 1.7–7.7)
Neutrophils Relative %: 94 %
Platelets: 154 10*3/uL (ref 150–400)
RBC: 4.82 MIL/uL (ref 4.22–5.81)
RDW: 17.2 % — ABNORMAL HIGH (ref 11.5–15.5)
WBC: 13.5 10*3/uL — ABNORMAL HIGH (ref 4.0–10.5)
nRBC: 0 % (ref 0.0–0.2)

## 2022-03-24 LAB — BLOOD GAS, ARTERIAL
Acid-base deficit: 1.2 mmol/L (ref 0.0–2.0)
Bicarbonate: 23.6 mmol/L (ref 20.0–28.0)
O2 Saturation: 96.2 %
Patient temperature: 37
pCO2 arterial: 39 mmHg (ref 32–48)
pH, Arterial: 7.39 (ref 7.35–7.45)
pO2, Arterial: 73 mmHg — ABNORMAL LOW (ref 83–108)

## 2022-03-24 LAB — APTT: aPTT: 35 seconds (ref 24–36)

## 2022-03-24 LAB — PROCALCITONIN: Procalcitonin: 13.92 ng/mL

## 2022-03-24 LAB — URINE DRUG SCREEN, QUALITATIVE (ARMC ONLY)
Amphetamines, Ur Screen: NOT DETECTED
Barbiturates, Ur Screen: NOT DETECTED
Benzodiazepine, Ur Scrn: NOT DETECTED
Cannabinoid 50 Ng, Ur ~~LOC~~: NOT DETECTED
Cocaine Metabolite,Ur ~~LOC~~: NOT DETECTED
MDMA (Ecstasy)Ur Screen: NOT DETECTED
Methadone Scn, Ur: NOT DETECTED
Opiate, Ur Screen: NOT DETECTED
Phencyclidine (PCP) Ur S: NOT DETECTED
Tricyclic, Ur Screen: NOT DETECTED

## 2022-03-24 LAB — PROTIME-INR
INR: 1.6 — ABNORMAL HIGH (ref 0.8–1.2)
Prothrombin Time: 19.3 seconds — ABNORMAL HIGH (ref 11.4–15.2)

## 2022-03-24 LAB — GLUCOSE, CAPILLARY
Glucose-Capillary: 107 mg/dL — ABNORMAL HIGH (ref 70–99)
Glucose-Capillary: 92 mg/dL (ref 70–99)
Glucose-Capillary: 88 mg/dL (ref 70–99)
Glucose-Capillary: 79 mg/dL (ref 70–99)

## 2022-03-24 LAB — LACTIC ACID, PLASMA
Lactic Acid, Venous: 3.9 mmol/L (ref 0.5–1.9)
Lactic Acid, Venous: 3.7 mmol/L (ref 0.5–1.9)
Lactic Acid, Venous: 2.4 mmol/L (ref 0.5–1.9)
Lactic Acid, Venous: 2.2 mmol/L (ref 0.5–1.9)

## 2022-03-24 LAB — URINALYSIS, COMPLETE (UACMP) WITH MICROSCOPIC
Bilirubin Urine: NEGATIVE
Glucose, UA: NEGATIVE mg/dL
Ketones, ur: 5 mg/dL — AB
Nitrite: NEGATIVE
Protein, ur: >=300 mg/dL — AB
Specific Gravity, Urine: 1.014 (ref 1.005–1.030)
Squamous Epithelial / HPF: NONE SEEN (ref 0–5)
WBC, UA: >50 WBC/hpf — ABNORMAL HIGH (ref 0–5)
pH: 7 (ref 5.0–8.0)

## 2022-03-24 LAB — RESP PANEL BY RT-PCR (FLU A&B, COVID) ARPGX2
Influenza A by PCR: NEGATIVE
Influenza B by PCR: NEGATIVE
SARS Coronavirus 2 by RT PCR: POSITIVE — AB

## 2022-03-24 LAB — BRAIN NATRIURETIC PEPTIDE: B Natriuretic Peptide: 1530.8 pg/mL — ABNORMAL HIGH (ref 0.0–100.0)

## 2022-03-24 LAB — CK: Total CK: 484 U/L — ABNORMAL HIGH (ref 49–397)

## 2022-03-24 LAB — MRSA NEXT GEN BY PCR, NASAL: MRSA by PCR Next Gen: NOT DETECTED

## 2022-03-24 LAB — CBG MONITORING, ED
Glucose-Capillary: 56 mg/dL — ABNORMAL LOW (ref 70–99)
Glucose-Capillary: 97 mg/dL (ref 70–99)

## 2022-03-24 LAB — D-DIMER, QUANTITATIVE: D-Dimer, Quant: 12.35 ug/mL-FEU — ABNORMAL HIGH (ref 0.00–0.50)

## 2022-03-24 LAB — C-REACTIVE PROTEIN: CRP: 20.2 mg/dL — ABNORMAL HIGH (ref <1.0)

## 2022-03-24 LAB — TROPONIN I (HIGH SENSITIVITY)
Troponin I (High Sensitivity): 138 ng/L (ref <18)
Troponin I (High Sensitivity): 120 ng/L (ref <18)

## 2022-03-24 LAB — FERRITIN: Ferritin: 899 ng/mL — ABNORMAL HIGH (ref 24–336)

## 2022-03-24 MED ORDER — HALOPERIDOL LACTATE 5 MG/ML IJ SOLN
2.0000 mg | Freq: Four times a day (QID) | INTRAMUSCULAR | Status: DC | PRN
  Administered 2022-03-24: 2 mg via INTRAVENOUS
  Filled 2022-03-24 (×4): qty 1

## 2022-03-24 MED ORDER — ORAL CARE MOUTH RINSE: 15.0000 mL | OROMUCOSAL | Status: DC | PRN

## 2022-03-24 MED ORDER — NIRMATRELVIR/RITONAVIR (PAXLOVID) TABLET (RENAL DOSING): 2.0000 | ORAL_TABLET | Freq: Two times a day (BID) | ORAL | Status: DC

## 2022-03-24 MED ORDER — ALBUMIN HUMAN 5 % IV SOLN
25.0000 g | Freq: Once | INTRAVENOUS | Status: AC
  Administered 2022-03-24: 25 g via INTRAVENOUS
  Filled 2022-03-24 (×2): qty 500

## 2022-03-24 MED ORDER — SODIUM CHLORIDE 0.9 % IV SOLN
200.0000 mg | Freq: Once | INTRAVENOUS | Status: AC
  Administered 2022-03-24: 200 mg via INTRAVENOUS
  Filled 2022-03-24: qty 40

## 2022-03-24 MED ORDER — SODIUM CHLORIDE 0.9 % IV SOLN: 250.0000 mL | INTRAVENOUS | Status: DC

## 2022-03-24 MED ORDER — NOREPINEPHRINE 4 MG/250ML-% IV SOLN: 0.0000 ug/min | INTRAVENOUS | Status: DC

## 2022-03-24 MED ORDER — SODIUM CHLORIDE 0.9 % IV SOLN
100.0000 mg | Freq: Every day | INTRAVENOUS | Status: DC
  Filled 2022-03-24: qty 20

## 2022-03-24 MED ORDER — SODIUM CHLORIDE 0.9 % IV BOLUS (SEPSIS)
1000.0000 mL | Freq: Once | INTRAVENOUS | Status: AC
  Administered 2022-03-24: 1000 mL via INTRAVENOUS

## 2022-03-24 MED ORDER — DEXTROSE 50 % IV SOLN
50.0000 mL | Freq: Once | INTRAVENOUS | Status: AC
  Administered 2022-03-24: 50 mL via INTRAVENOUS
  Filled 2022-03-24: qty 50

## 2022-03-24 MED ORDER — SODIUM CHLORIDE 0.9 % IV SOLN: INTRAVENOUS | Status: DC

## 2022-03-24 MED ORDER — LIDOCAINE HCL (CARDIAC) PF 100 MG/5ML IV SOSY
PREFILLED_SYRINGE | INTRAVENOUS | Status: AC
  Filled 2022-03-24: qty 5

## 2022-03-24 MED ORDER — ALBUTEROL SULFATE HFA 108 (90 BASE) MCG/ACT IN AERS: 2.0000 | INHALATION_SPRAY | RESPIRATORY_TRACT | Status: DC | PRN

## 2022-03-24 MED ORDER — ACETAMINOPHEN 325 MG PO TABS: 650.0000 mg | ORAL_TABLET | Freq: Four times a day (QID) | ORAL | Status: DC | PRN

## 2022-03-24 MED ORDER — CHLORHEXIDINE GLUCONATE CLOTH 2 % EX PADS
6.0000 | MEDICATED_PAD | Freq: Every day | CUTANEOUS | Status: DC
  Administered 2022-03-24 – 2022-04-01 (×9): 6 via TOPICAL

## 2022-03-24 MED ORDER — ACETAMINOPHEN 325 MG RE SUPP
650.0000 mg | Freq: Once | RECTAL | Status: AC
  Administered 2022-03-24: 650 mg via RECTAL
  Filled 2022-03-24: qty 2

## 2022-03-24 MED ORDER — MORPHINE SULFATE (PF) 2 MG/ML IV SOLN
2.0000 mg | Freq: Once | INTRAVENOUS | Status: AC
  Administered 2022-03-24: 2 mg via INTRAVENOUS
  Filled 2022-03-24: qty 1

## 2022-03-24 MED ORDER — NOREPINEPHRINE 4 MG/250ML-% IV SOLN
2.0000 ug/min | INTRAVENOUS | Status: DC
  Administered 2022-03-24: 5 ug/min via INTRAVENOUS

## 2022-03-24 MED ORDER — DIPHENHYDRAMINE HCL 50 MG/ML IJ SOLN: 12.5000 mg | Freq: Three times a day (TID) | INTRAMUSCULAR | Status: DC | PRN

## 2022-03-24 MED ORDER — HALOPERIDOL LACTATE 5 MG/ML IJ SOLN
2.5000 mg | Freq: Once | INTRAMUSCULAR | Status: AC
  Administered 2022-03-24: 2.5 mg via INTRAVENOUS

## 2022-03-24 MED ORDER — HEPARIN SODIUM (PORCINE) 5000 UNIT/ML IJ SOLN
5000.0000 [IU] | Freq: Three times a day (TID) | INTRAMUSCULAR | Status: DC
  Administered 2022-03-24 – 2022-03-29 (×15): 5000 [IU] via SUBCUTANEOUS
  Filled 2022-03-24 (×15): qty 1

## 2022-03-24 MED ORDER — SODIUM CHLORIDE 0.9 % IV SOLN
500.0000 mg | Freq: Two times a day (BID) | INTRAVENOUS | Status: DC
  Administered 2022-03-24 (×2): 500 mg via INTRAVENOUS
  Filled 2022-03-24: qty 500
  Filled 2022-03-24: qty 10

## 2022-03-24 MED ORDER — HYDROCORTISONE SOD SUC (PF) 100 MG IJ SOLR
100.0000 mg | Freq: Once | INTRAMUSCULAR | Status: AC
  Administered 2022-03-24: 100 mg via INTRAVENOUS
  Filled 2022-03-24: qty 2

## 2022-03-24 MED ORDER — VASOPRESSIN 20 UNITS/100 ML INFUSION FOR SHOCK
0.0000 [IU]/min | INTRAVENOUS | Status: DC
  Administered 2022-03-24 – 2022-03-28 (×10): 0.03 [IU]/min via INTRAVENOUS
  Filled 2022-03-24 (×11): qty 100

## 2022-03-24 MED ORDER — MIDODRINE HCL 5 MG PO TABS: 10.0000 mg | ORAL_TABLET | Freq: Three times a day (TID) | ORAL | Status: DC

## 2022-03-24 MED ORDER — SODIUM CHLORIDE 0.9 % IV SOLN
2.0000 g | Freq: Once | INTRAVENOUS | Status: AC
  Administered 2022-03-24: 2 g via INTRAVENOUS
  Filled 2022-03-24: qty 12.5

## 2022-03-24 MED ORDER — LIDOCAINE HCL URETHRAL/MUCOSAL 2 % EX GEL
1.0000 | Freq: Once | CUTANEOUS | Status: AC
  Administered 2022-03-24: 1 via URETHRAL
  Filled 2022-03-24: qty 10

## 2022-03-24 MED ORDER — NOREPINEPHRINE 4 MG/250ML-% IV SOLN
INTRAVENOUS | Status: AC
  Filled 2022-03-24: qty 250

## 2022-03-24 MED ORDER — NOREPINEPHRINE 16 MG/250ML-% IV SOLN
0.0000 ug/min | INTRAVENOUS | Status: DC
  Administered 2022-03-24: 18 ug/min via INTRAVENOUS
  Filled 2022-03-24 (×2): qty 250

## 2022-03-24 MED ORDER — DEXTROSE 50 % IV SOLN
50.0000 mL | INTRAVENOUS | Status: DC | PRN
  Administered 2022-03-29 – 2022-04-01 (×7): 50 mL via INTRAVENOUS
  Filled 2022-03-24 (×7): qty 50

## 2022-03-24 MED ORDER — MIDAZOLAM HCL 2 MG/2ML IJ SOLN
0.5000 mg | Freq: Once | INTRAMUSCULAR | Status: AC
  Administered 2022-03-24: 0.5 mg via INTRAVENOUS
  Filled 2022-03-24: qty 2

## 2022-03-24 MED ORDER — DM-GUAIFENESIN ER 30-600 MG PO TB12: 1.0000 | ORAL_TABLET | Freq: Two times a day (BID) | ORAL | Status: DC | PRN

## 2022-03-24 MED ORDER — MOLNUPIRAVIR EUA 200MG CAPSULE
4.0000 | ORAL_CAPSULE | Freq: Two times a day (BID) | ORAL | Status: DC
  Filled 2022-03-24: qty 4

## 2022-03-24 MED ORDER — SODIUM CHLORIDE 0.9 % IV BOLUS: 1000.0000 mL | Freq: Once | INTRAVENOUS | Status: DC

## 2022-03-24 MED ORDER — LACTATED RINGERS IV BOLUS (SEPSIS)
2250.0000 mL | Freq: Once | INTRAVENOUS | Status: AC
  Administered 2022-03-24: 2250 mL via INTRAVENOUS

## 2022-03-24 MED ORDER — ACETAMINOPHEN 500 MG PO TABS: 1000.0000 mg | ORAL_TABLET | Freq: Once | ORAL | Status: DC

## 2022-03-24 MED ORDER — HALOPERIDOL LACTATE 5 MG/ML IJ SOLN
2.0000 mg | Freq: Once | INTRAMUSCULAR | Status: AC | PRN
  Administered 2022-03-24: 2 mg via INTRAVENOUS
  Filled 2022-03-24: qty 1

## 2022-03-24 MED ORDER — HYDROCORTISONE SOD SUC (PF) 100 MG IJ SOLR
50.0000 mg | Freq: Four times a day (QID) | INTRAMUSCULAR | Status: AC
  Administered 2022-03-24 – 2022-03-25 (×5): 50 mg via INTRAVENOUS
  Filled 2022-03-24 (×5): qty 2

## 2022-03-24 MED ORDER — MIDAZOLAM HCL 2 MG/2ML IJ SOLN
1.5000 mg | Freq: Once | INTRAMUSCULAR | Status: AC
  Administered 2022-03-24: 1.5 mg via INTRAVENOUS

## 2022-03-24 NOTE — ED Notes (Signed)
Dr. Clyde Lundborg present in ED, low BP discussed, Levophed to be ordered. V.O.V.

## 2022-03-24 NOTE — Procedures (Signed)
Central Venous Catheter Insertion Procedure Note  [facility]  [id number]  05/19/1930  Date:04/06/2022  Time:3:40 PM   Provider Performing:Albina Gosney D Elvina Sidle   Procedure: Insertion of Non-tunneled Central Venous [phone]) with US guidance ([id number])   Indication(s) Medication administration and Difficult access  Consent Risks of the procedure as well as the alternatives and risks of each were explained to the patient and/or caregiver.  Consent for the procedure was obtained and is signed in the bedside chart  Anesthesia Topical only with 1% lidocaine   Timeout Verified patient identification, verified procedure, site/side was marked, verified correct patient position, special equipment/implants available, medications/allergies/relevant history reviewed, required imaging and test results available.  Sterile Technique Maximal sterile technique including full sterile barrier drape, hand hygiene, sterile gown, sterile gloves, mask, hair covering, sterile ultrasound probe cover (if used).  Procedure Description Area of catheter insertion was cleaned with chlorhexidine and draped in sterile fashion.  With real-time ultrasound guidance a central venous catheter was placed into the right internal jugular vein. Nonpulsatile blood flow and easy flushing noted in all ports.  The catheter was sutured in place and sterile dressing applied.  Complications/Tolerance None; patient tolerated the procedure well. Chest X-ray is ordered to verify placement for internal jugular or subclavian cannulation.   Chest x-ray is not ordered for femoral cannulation.  EBL Minimal  Specimen(s) None   Line secured at the 17 cm mark. BIOPATCH applied to the insertion site.   Harlon Ditty, AGACNP-BC [facility] Prefer epic messenger for cross cover needs If after hours, please call E-link

## 2022-03-24 NOTE — ED Provider Notes (Signed)
[facility] Provider Note    Event Date/Time   First MD Initiated Contact with Patient 03/29/2022 [phone]     (approximate)   History   Chief Complaint: Altered Mental Status   HPI  [name] is a 88 y.o. male with a history of hypertension hyperlipidemia lymphedema heart failure who was sent to the ED today due to confusion and generalized weakness.  Noted to have acute change in his condition this morning.  Patient is confused and not able to provide any meaningful history or answer questions.  Reportedly patient has chronic indwelling Foley which recently was inadvertently removed.  Staff reported to EMS they were unable to successfully reinsert.     Physical Exam   Triage Vital Signs: ED Triage Vitals  Enc Vitals Group     BP      Pulse      Resp      Temp      Temp src      SpO2      Weight      Height      Head Circumference      [facility] Flow      Pain Score      Pain Loc      Pain Edu?      Excl. in GC?     Most recent vital signs: Vitals:   03/22/2022 1100 03/21/2022 1115  BP: (!) 86/43 (!) 113/45  Pulse: 90 (!) 111  Resp: 16 20  Temp:    SpO2: 100% 100%    General: Awake, ill-appearing.  Not oriented.  CV:  Good peripheral perfusion.  Tachycardia heart rate 110 Resp:  Normal effort.  Tachypnea, respiratory rate of 25.  Lungs clear to auscultation bilaterally without crackles or wheezing Abd:  No distention.  Soft with suprapubic tenderness.  Slight abdominal wall edema Other:  Symmetric lymphedema bilateral lower extremities.  Chronic venous stasis changes bilateral lower legs. Dry mucous membranes. Urinary incontinence, malodorous urine.   ED Results / Procedures / Treatments   Labs (all labs ordered are listed, but only abnormal results are displayed) Labs Reviewed  RESP PANEL BY RT-PCR (FLU A&B, COVID) ARPGX2 - Abnormal; Notable for the following components:      Result Value   SARS Coronavirus 2 by RT PCR POSITIVE (*)     All other components within normal limits  LACTIC ACID, PLASMA - Abnormal; Notable for the following components:   Lactic Acid, Venous 3.9 (*)    All other components within normal limits  COMPREHENSIVE METABOLIC PANEL - Abnormal; Notable for the following components:   Glucose, Bld 61 (*)    BUN 59 (*)    Creatinine, Ser 4.01 (*)    Calcium 8.3 (*)    Total Protein 6.1 (*)    Albumin 2.6 (*)    AST 65 (*)    Alkaline Phosphatase 171 (*)    Total Bilirubin 3.1 (*)    GFR, Estimated 14 (*)    All other components within normal limits  CBC WITH DIFFERENTIAL/PLATELET - Abnormal; Notable for the following components:   WBC 13.5 (*)    RDW 17.2 (*)    Neutro Abs 12.6 (*)    Lymphs Abs 0.4 (*)    Abs Immature Granulocytes 0.11 (*)    All other components within normal limits  PROTIME-INR - Abnormal; Notable for the following components:   Prothrombin Time 19.3 (*)    INR 1.6 (*)    All other  components within normal limits  URINALYSIS, COMPLETE (UACMP) WITH MICROSCOPIC - Abnormal; Notable for the following components:   Color, Urine YELLOW (*)    APPearance TURBID (*)    Hgb urine dipstick SMALL (*)    Ketones, ur 5 (*)    Protein, ur >=300 (*)    Leukocytes,Ua MODERATE (*)    WBC, UA >50 (*)    Bacteria, UA MANY (*)    All other components within normal limits  CULTURE, BLOOD (ROUTINE X 2)  CULTURE, BLOOD (ROUTINE X 2)  URINE CULTURE  APTT  LACTIC ACID, PLASMA     EKG    RADIOLOGY CT abdomen pelvis interpreted by me, negative for ureteral obstruction or bowel obstruction.  Radiology report reviewed, unremarkable   PROCEDURES:  .Critical Care  Performed by: Sharman Cheek, MD Authorized by: Sharman Cheek, MD   Critical care provider statement:    Critical care time (minutes):  35   Critical care time was exclusive of:  Separately billable procedures and treating other patients   Critical care was necessary to treat or prevent imminent or  life-threatening deterioration of the following conditions:  Sepsis and CNS failure or compromise   Critical care was time spent personally by me on the following activities:  Development of treatment plan with patient or surrogate, discussions with consultants, evaluation of patient's response to treatment, examination of patient, obtaining history from patient or surrogate, ordering and performing treatments and interventions, ordering and review of laboratory studies, ordering and review of radiographic studies, pulse oximetry, re-evaluation of patient's condition and review of old charts   Care discussed with: admitting provider   Comments:           MEDICATIONS ORDERED IN ED: Medications  acetaminophen (TYLENOL) tablet 1,000 mg (1,000 mg Oral Not Given 04/10/2022 1112)  lactated ringers bolus 2,250 mL (2,250 mLs Intravenous New Bag/Given 03/29/2022 1127)  sodium chloride 0.9 % bolus 1,000 mL (0 mLs Intravenous Stopped 03/28/2022 1035)  ceFEPIme (MAXIPIME) 2 g in sodium chloride 0.9 % 100 mL IVPB (0 g Intravenous Stopped 04/07/2022 0953)  acetaminophen (TYLENOL) suppository 650 mg (650 mg Rectal Given 03/26/2022 1015)  lidocaine (XYLOCAINE) 2 % jelly 1 Application (1 Application Urethral Given 03/20/2022 1000)  morphine (PF) 2 MG/ML injection 2 mg (2 mg Intravenous Given 04/11/2022 0955)  haloperidol lactate (HALDOL) injection 2 mg (2 mg Intravenous Given 03/19/2022 0956)     IMPRESSION / MDM / ASSESSMENT AND PLAN / ED COURSE  I reviewed the triage vital signs and the nursing notes.                              Differential diagnosis includes, but is not limited to, UTI, sepsis, pneumonia, viral illness, urinary retention, diverticulitis, bowel obstruction, bowel perforation, intra-abdominal abscess.  Doubt intracranial hemorrhage, hyperammonemia, dissection, aneurysm, ACS, PE  Patient's presentation is most consistent with acute presentation with potential threat to life or bodily function.  Patient  presents with fever tachycardia tachypnea confusion, most likely septic, suspect urinary source.  Will give empiric cefepime, saline bolus, check labs and chest x-ray.   Clinical Course as of 04/14/2022 1134  Wed Mar 24, 2022  0932 Labs show acute renal failure, lactate 3.9, leukocytosis of 13,000.  Plan to reinsert Foley and obtain urinalysis.  Will obtain CT abdomen pelvis without contrast.  Plan to admit. [PS]  1115 Patient has had downtrending blood pressure, hypotension.  He has received 1 L IV  fluids already.  Will give additional fluids for a total of 30 mL/kg,  3250 mL target. [PS]    Clinical Course User Index [PS] Sharman Cheek, MD     FINAL CLINICAL IMPRESSION(S) / ED DIAGNOSES   Final diagnoses:  Cystitis  Severe sepsis (HCC)  Acute metabolic encephalopathy     Rx / DC Orders   ED Discharge Orders     None        Note:  This document was prepared using Dragon voice recognition software and may include unintentional dictation errors.   Sharman Cheek, MD 04/13/2022 1135

## 2022-03-24 NOTE — Sepsis Progress Note (Signed)
Code Sepsis protocol being monitored by eLink. 

## 2022-03-24 NOTE — Sepsis Progress Note (Signed)
Secure chat to ED MD to see if wanted to order any additional fluid bolus. Lactic acid 3.9. Hx CHF.

## 2022-03-24 NOTE — Progress Notes (Signed)
PHARMACY -  BRIEF ANTIBIOTIC NOTE   Pharmacy has received consult(s) for cefepime from an ED provider.  The patient's profile has been reviewed for ht/wt/allergies/indication/available labs.    One time order(s) placed for cefepime 2 grams x 1  Further antibiotics/pharmacy consults should be ordered by admitting physician if indicated.                       Thank you, Jaynie Bream 03/30/22  8:58 AM

## 2022-03-24 NOTE — Consult Note (Signed)
Pharmacy Antibiotic Note  [name] is a 88 y.o. male admitted on 2022/04/21 with  severe sepsis d/t complicated UTI .  Pharmacy has been consulted for meropenem dosing.  Assessment: 88 y.o. male who presents to the ED today due to confusion and generalized weakness. Has chronic indwelling Foley which recently was inadvertently removed.  Pt also has confirmed Covid-19 infection, treating with molnupravir x 5 days.  Currently has AKI with Scr of 4.01 (baseline appears to be ~0.7-0.8s).     Weight: 106.6 kg (235 lb)  Temp (24hrs), Avg:100.1 F (37.8 C), Min:99 F (37.2 C), Max:101.7 F (38.7 C)  Recent Labs  Lab 04-21-22 0845 04/21/22 1126  WBC 13.5*  --   CREATININE 4.01*  --   LATICACIDVEN 3.9* 3.7*    Estimated Creatinine Clearance: 15.5 mL/min (A) (by C-G formula based on SCr of 4.01 mg/dL (H)).    Allergies  Allergen Reactions   Aspirin Rash, Other (See Comments) and Hives    Reaction: Trouble breathing     Antimicrobials this admission: cefepime x1 dose 11/8  Meropenem 11/8 >>   Dose adjustments this admission: N/A  Microbiology results: 11/8 BCx: pending 11/8 UCx: pending    Thank you for allowing pharmacy to be a part of this patient's care.  Manfred Shirts 21-Apr-2022 1:45 PM

## 2022-03-24 NOTE — ED Notes (Signed)
EDP present on arrival 

## 2022-03-24 NOTE — ED Notes (Signed)
IVF bolus continues, pt to CT

## 2022-03-24 NOTE — Consult Note (Signed)
[name], MRN:  [id number], DOB:  1930/10/10, LOS: 0 ADMISSION DATE:  04/14/2022, CONSULTATION DATE:  04/08/2022 REFERRING MD:  Dr. Blaine Hamper, CHIEF COMPLAINT:  Altered Mental Status, generalized weakness   Brief Pt Description / Synopsis:  88 y.o. Male admitted with Acute Metabolic Encephalopathy and severe sepsis with Septic shock in setting of UTI & COVID-19 infection.  Also found to have AKI.  History of Present Illness:  [name] is a 88 y.o. male with a past medical history significant for indwelling foley catheter, hypertension, hyperlipidemia, asthma, stroke, depression, severe aortic stenosis, mitral valve regurgitation, CAD, HFpEF, hyperbilirubinemia, with who presents to [facility] ED due to altered mental status and generalized weakness.  Pt remains altered and is unable to contribute to history, therefore history is obtained from chart review.  Per ED and nursing notes, he was sent in from his nursing facility due to altered mental status.  Patient's chronic indwelling Foley catheter was removed, and was unable to be replaced at the facility.  He was noted to have fever and chills, and to be incontinent with malodorous urine.   ED Course: Initial Vital Signs: Temperature 99.5 F orally, respiratory rate 36, pulse 118, blood pressure 108/50, SPO2 95% on 2 L nasal cannula Significant Labs: Glucose 61, BUN 59, creatinine 4.0, alkaline phosphatase 171, albumin 2.6, AST 65, ALT 29, total bilirubin 3.1, BNP 1530, CK 484, lactic acid 3.9, procalcitonin 13.9, WBC 13.5 with neutrophilia, INR 1.6, PT 19.3 Urinalysis consistent with UTI COVID-19 PCR is positive Imaging Chest X-ray>>IMPRESSION: Enlargement of cardiac silhouette with pulmonary vascular congestion. Persistent eventration RIGHT diaphragm with RIGHT basilar atelectasis. Aortic Atherosclerosis CT abdomen pelvis without contrast>>IMPRESSION: 1. No acute findings in the abdomen or pelvis. 2. Infrarenal abdominal aortic aneurysm,  measuring up to 5.3 cm, unchanged when compared with prior. Recommend follow-up CT/MR every 6 months and vascular consultation. This recommendation follows ACR consensus guidelines: White Paper of the ACR Incidental Findings Committee II on Vascular Findings. J Am Coll Radiol 2013; 10:789-794. 3. Aortic Atherosclerosis CT head currently pending Medications Administered: 4 L IV fluids, 25 mg albumin, 100 mg Solu-Cortef, IV cefepime, Levophed drip initiated  He met sepsis criteria therefore he was given IV fluid resuscitation along with IV antibiotics including cefepime.  Despite fluid resuscitation he remained hypotensive requiring peripheral Levophed drip.  Hospitalist were asked to admit, PCCM is consulted for assistance with management of shock and vasopressors.  Please see "significant hospital events" section below for full detailed hospital course.  Pertinent  Medical History   Past Medical History:  Diagnosis Date   (HFpEF) heart failure with preserved ejection fraction ([place])    a. 03/2018 Echo: EF 60-65%, Gr1 DD.   Aortic atherosclerosis ([place]) 05/2013   Per TEE   Arrhythmia    Asthma    CHF (congestive heart failure) (HCC)    Colon polyp    Coronary artery disease    a. Nonobstructive by 2004 cath b. 05/2013 NSTEMI/Cath: >M 30, LAD 68m LCX 237mRCA 30p, EF 60%, ? Sev AS (mod by TEE).   GERD (gastroesophageal reflux disease)    Heart murmur    Hiatal hernia    Hyperlipidemia    Hypertension    Lymphedema    Mitral regurgitation    a. 03/2018 Echo: Mild to mod MR.   Obesity    Onychomycosis    Osteoarthritis    Recurrent cellulitis of lower extremity    Severe aortic stenosis    a. 2013 Echo: EF 55%,  mod AS; b. TEE 05/2013: EF EF 60-65%, mod AS; c.10/2014 Echo: 55-60%, mod AS; d. 10/2016 Echo: EF 65-70%, Mod-Sev AS; e. 03/2018 [facility]: EF 60-65%, Sev AS, mild to mod AI. Mean grad (S) 62mHg, Valve Area (VTI): 0.92cm^2.     Micro Data:  11/8: SARS-CoV-2 PCR>>  Positive 11/8: Blood culture x2>> 11/8: Urine>>  Antimicrobials:  Cefepime 11/8 x1 dose Meropenem 11/8>> Molnupiravir 11/8>>  Significant Hospital Events: Including procedures, antibiotic start and stop dates in addition to other pertinent events   11/8: Admitted by Hospitalist.  Remained hypotensive despite IVF resuscitation. Levophed started, PCCM consulted.  Right IJ CVC and right radial A-line placed  Interim History / Subjective:  Patient admitted by the hospitalist -Remained hypotensive requiring initiation of peripheral Levophed drip, PCCM consulted to assist with management of shock and pressors -Upon evaluation in ICU, patient remains very lethargic, currently protecting his airway but, but high risk for intubation -With increasing vasopressor requirements, will place central and A-line's ~consent was obtained from patient's stepdaughter at bedside for procedures  Objective   Blood pressure (!) 90/38, pulse 98, temperature 99 F (37.2 C), temperature source Oral, resp. rate 13, weight 106.6 kg, SpO2 100 %.    FiO2 (%):  [100 %] 100 %   Intake/Output Summary (Last 24 hours) at 03/19/2022 1337 Last data filed at 03/26/2022 1259 Gross per 24 hour  Intake 4050 ml  Output 675 ml  Net 3375 ml   Filed Weights   03/18/2022 0846  Weight: 106.6 kg    Examination: General: Acute on chronically ill-appearing male, laying in bed, on 2 L nasal cannula, no acute distress HENT: Atraumatic, normocephalic, neck supple, positive JVD Lungs: Coarse breath sounds throughout, even, nonlabored, mild tachypnea Cardiovascular: Tachycardia, regular rhythm, S1-S2, positive murmur Abdomen: Soft, nontender, nondistended, no guarding room tenderness, bowel sounds positive x4 Extremities: 1+ pitting edema bilateral lower extremities, chronic venous insufficiency trophic changes and lymphedema, 1+ distal pulses Neuro: Somnolent, withdraws from pain, does not follow commands, pupils PERRLA GU: Foley  catheter in place draining dark yellow urine  Resolved Hospital Problem list     Assessment & Plan:   Septic Shock Acute on Chronic HFpEF PMHx: HFpEF, CAD, aortic stenosis, mitral valve regurgitation , HTN, HLD Echocardiogram 02/26/2022: LVEF 55/60%, indeterminate Diastolic parameters, RV function normal, Moderate MR, moderate AR, severe AS -Continuous cardiac monitoring -Maintain MAP >65 -Cautious IV fluids -Vasopressors as needed to maintain MAP goal -Continue Midodrine if able to tolerate PO -Start stress dose steroids -Trend lactic acid until normalized -Check HS Troponin  -Consider repeat Echocardiogram  -Diuresis as blood pressure and renal function permits ~currently unable to diurese due to shock  Severe Sepsis due to UTI & COVID-19 Infection -Monitor fever curve -Trend WBC's, Procalcitonin, and inflammatory markers -Follow cultures as above -Continue empiric Meropenem &  Molnupiravir pending cultures & sensitivities -Continue steroids  Acute Kidney Injury CT Abdomen & Pelvis without hydronephrosis & no definitive stones -Monitor I&O's / urinary output -Follow BMP -Ensure adequate renal perfusion -Avoid nephrotoxic agents as able -Replace electrolytes as indicated -IV fluids -Trend CK -Low threshold for Nephrology consult  Elevated LFT's PMHx: Hyperbilirubinemia CT Abdomen & Pelvis without acute intra-abdominal findings -Trend LFTs  Acute Metabolic Encephalopathy due to severe sepsis and AKI PMHx: Depression -Treatment of sepsis and metabolic disorders as outlined above -Avoid sedating meds as able -Provide supportive care -N.p.o. for now, currently protecting airway, but high risk for intubation -CT head is currently pending -Check ABG, UDS & thyroid panel  Patient is critically ill with severe shock and multiorgan failure.  Prognosis is extremely guarded.  High risk for decompensation, need for intubation, cardiac arrest and death.  Given advanced  age and comorbidities recommend DNR status.  Best Practice (right click and "Reselect all SmartList Selections" daily)   Diet/type: NPO DVT prophylaxis: prophylactic heparin  GI prophylaxis: N/A Lines: N/A Foley:  Yes, and it is still needed Code Status:  full code Last date of multidisciplinary goals of care discussion [N/A]  11/8: Updated patient's stepdaughter at bedside  Labs   CBC: Recent Labs  Lab 04/04/2022 0845  WBC 13.5*  NEUTROABS 12.6*  HGB 13.1  HCT 40.1  MCV 83.2  PLT 540    Basic Metabolic Panel: Recent Labs  Lab 04/04/2022 0845  NA 143  K 4.3  CL 108  CO2 24  GLUCOSE 61*  BUN 59*  CREATININE 4.01*  CALCIUM 8.3*   GFR: Estimated Creatinine Clearance: 15.5 mL/min (A) (by C-G formula based on SCr of 4.01 mg/dL (H)). Recent Labs  Lab 04/06/2022 0844 04/12/2022 0845 04/04/2022 1126  PROCALCITON 13.92  --   --   WBC  --  13.5*  --   LATICACIDVEN  --  3.9* 3.7*    Liver Function Tests: Recent Labs  Lab 03/23/2022 0845  AST 65*  ALT 29  ALKPHOS 171*  BILITOT 3.1*  PROT 6.1*  ALBUMIN 2.6*   No results for input(s): "LIPASE", "AMYLASE" in the last 168 hours. No results for input(s): "AMMONIA" in the last 168 hours.  ABG    Component Value Date/Time   HCO3 31.1 (H) 02/24/2022 2052   O2SAT 62.4 02/24/2022 2052     Coagulation Profile: Recent Labs  Lab 04/04/2022 0845  INR 1.6*    Cardiac Enzymes: Recent Labs  Lab 04/09/2022 0844  CKTOTAL 484*    HbA1C: Hemoglobin A1C  Date/Time Value Ref Range Status  05/21/2013 04:03 AM 5.9 4.2 - 6.3 % Final    Comment:    The American Diabetes Association recommends that a primary goal of therapy should be <7% and that physicians should reevaluate the treatment regimen in patients with HbA1c values consistently >8%.    Hgb A1c MFr Bld  Date/Time Value Ref Range Status  02/24/2022 08:52 PM 4.7 (L) 4.8 - 5.6 % Final    Comment:    (NOTE) Pre diabetes:          5.7%-6.4%  Diabetes:               >6.4%  Glycemic control for   <7.0% adults with diabetes   11/28/2019 03:38 PM 5.2 4.6 - 6.5 % Final    Comment:    Glycemic Control Guidelines for People with Diabetes:Non Diabetic:  <6%Goal of Therapy: <7%Additional Action Suggested:  >8%     CBG: Recent Labs  Lab 04/14/2022 1201  [place]*    Review of Systems:   Unable to assess due to altered mental status  Past Medical History:  He,  has a past medical history of (HFpEF) heart failure with preserved ejection fraction ([place]), Aortic atherosclerosis ([place]) (05/2013), Arrhythmia, Asthma, CHF (congestive heart failure) ([place]), Colon polyp, Coronary artery disease, GERD (gastroesophageal reflux disease), Heart murmur, Hiatal hernia, Hyperlipidemia, Hypertension, Lymphedema, Mitral regurgitation, Obesity, Onychomycosis, Osteoarthritis, Recurrent cellulitis of lower extremity, and Severe aortic stenosis.   Surgical History:   Past Surgical History:  Procedure Laterality Date   CARDIAC CATHETERIZATION  11/2002   CARDIAC CATHETERIZATION  05/21/2013   showing 30% oLM, 30%  D1, 20% mLCx, 30% pRCA; EF 60%, severe AS (mean gradient 28 mmHg, peak 26, area 0.9)   CHOLECYSTECTOMY  2001   TRANSESOPHAGEAL ECHOCARDIOGRAM  05/22/2013   Preserved EF, moderate AS (valve area by planimetry between 1.0-1.26 cm sq), moderate aortic arch and descending aortic atherosclerosis.    TRANSTHORACIC ECHOCARDIOGRAM  05/19/2013   EF 60-65%, impaired diastolic function, mild LA dilatation, mild MR/AI/TR, mod AS, high normal RVSP (30.4 mmHg)     Social History:   reports that he quit smoking about 41 years ago. His smoking use included cigarettes. He has a 30.00 pack-year smoking history. He has never used smokeless tobacco. He reports that he does not drink alcohol and does not use drugs.   Family History:  His family history includes Heart disease in his mother; Heart disease (age of onset: 24) in his father; Stroke in his brother.   Allergies Allergies  Allergen  Reactions   Aspirin Rash, Other (See Comments) and Hives    Reaction: Trouble breathing      Home Medications  Prior to Admission medications   Medication Sig Start Date End Date Taking? Authorizing Provider  clopidogrel (PLAVIX) 75 MG tablet TAKE 1 TABLET(75 MG) BY MOUTH DAILY Patient taking differently: Take 75 mg by mouth daily. 06/03/21  Yes Loel Dubonnet, NP  folic acid (FOLVITE) 1 MG tablet Take 1 tablet (1 mg total) by mouth daily. 03/03/22 2022-04-10 Yes Pudota, Kathe Becton, MD  metoprolol tartrate (LOPRESSOR) 25 MG tablet Take 25 mg by mouth daily.   Yes [provider]  Multiple Vitamin (MULTIVITAMIN WITH MINERALS) TABS tablet Take 1 tablet by mouth daily. 03/03/22 10-Apr-2022 Yes Pudota, Kathe Becton, MD  nystatin powder Apply 1 Application topically 2 (two) times daily.   Yes [provider]  oxycodone (OXY-IR) 5 MG capsule Take 5 mg by mouth every 4 (four) hours as needed for pain.   Yes [provider]  polyethylene glycol (MIRALAX / GLYCOLAX) 17 g packet Take 17 g by mouth daily.   Yes [provider]  QUEtiapine (SEROQUEL) 25 MG tablet Take 1 tablet (25 mg total) by mouth at bedtime. 03/02/22 04/01/22 Yes Pudota, Kathe Becton, MD  rosuvastatin (CRESTOR) 20 MG tablet Take 1 tablet (20 mg total) by mouth daily. 03/03/22 04/10/22 Yes Pudota, Kathe Becton, MD  sennosides-docusate sodium (SENOKOT-S) 8.6-50 MG tablet Take 1 tablet by mouth daily.   Yes [provider]  thiamine (VITAMIN B-1) 100 MG tablet Take 1 tablet (100 mg total) by mouth daily. 03/03/22 10-Apr-2022 Yes Pudota, Kathe Becton, MD  zinc sulfate 220 (50 Zn) MG capsule Take 220 mg by mouth daily.   Yes [provider]  metoprolol succinate (TOPROL-XL) 25 MG 24 hr tablet TAKE 1 TABLET(25 MG) BY MOUTH DAILY Patient taking differently: Take 25 mg by mouth daily. 04/14/21   Loel Dubonnet, NP     Critical care time: 50 minutes     Darel Hong, AGACNP-BC [place]  [facility] Prefer epic messenger for cross cover needs If after hours, please call E-link

## 2022-03-24 NOTE — Progress Notes (Signed)
CODE SEPSIS - PHARMACY COMMUNICATION  **Broad Spectrum Antibiotics should be administered within 1 hour of Sepsis diagnosis**  Time Code Sepsis Called/Page Received: 2505  Antibiotics Ordered: cefepime 2 grams x 1  Time of 1st antibiotic administration: 0856  Additional action taken by pharmacy: n/a  If necessary, Name of Provider/Nurse Contacted: n/a    Jaynie Bream ,PharmD Clinical Pharmacist  04/01/2022  8:58 AM

## 2022-03-24 NOTE — Procedures (Signed)
Arterial Catheter Insertion Procedure Note  [facility] Fitte[facility]  [id number]  1930-06-13  Date:04/15/2022  Time:3:41 PM    Provider Performing: Judithe Modest    Procedure: Insertion of Arterial Line (14481) with US guidance (85631)   Indication(s) Blood pressure monitoring and/or need for frequent ABGs  Consent Risks of the procedure as well as the alternatives and risks of each were explained to the patient and/or caregiver.  Consent for the procedure was obtained and is signed in the bedside chart  Anesthesia None   Time Out Verified patient identification, verified procedure, site/side was marked, verified correct patient position, special equipment/implants available, medications/allergies/relevant history reviewed, required imaging and test results available.   Sterile Technique Maximal sterile technique including full sterile barrier drape, hand hygiene, sterile gown, sterile gloves, mask, hair covering, sterile ultrasound probe cover (if used).   Procedure Description Area of catheter insertion was cleaned with chlorhexidine and draped in sterile fashion. With real-time ultrasound guidance an arterial catheter was placed into the right radial artery.  Appropriate arterial tracings confirmed on monitor.     Complications/Tolerance None; patient tolerated the procedure well.   EBL Minimal   Specimen(s) None    Harlon Ditty, AGACNP-BC [place] [facility] Prefer epic messenger for cross cover needs If after hours, please call E-link

## 2022-03-24 NOTE — Progress Notes (Signed)
Versed administered for central line placement. Time out performed at bedside.

## 2022-03-24 NOTE — H&P (Signed)
History and Physical    [name] VEH:[id number] DOB: 03-Oct-1930 DOA: Apr 23, 2022  Referring MD/NP/PA:   PCP: Glori Luis, MD   Patient coming from:  The patient is coming from SNF Chief Complaint: AMS  HPI: [name] is a 88 y.o. male with medical history significant of indwelling foley catheter, hypertension, hyperlipidemia, asthma, stroke, depression, severe aortic stenosis, mitral valve regurgitation, CAD, dCHF, hyperbilirubinemia, with who presents with altered mental status.   Patient has altered mental status, cannot provide any medical history.  Per her daughter-in-law at bedside, pt has indwelling foley catheter. His Foley cath was out,  and could not be put back in facility. Pt developed fever and chills.  Found to have blood in the catheter.  Patient nausea, no vomiting, diarrhea noted.  Does not seem to have abdominal pain.  Patient is tested positive for COVID in ED, but no chest pain, shortness breath, cough noted.  At her normal baseline, patient is oriented x3 per his daughter.  When I saw patient in ED, patient is altered, barely arousable, not oriented to x3, not following command.  He moves all extremities on painful stimuli.  Patient was found to have positive urinalysis for UTI, and persistent hypotension. After giving 4 L IV fluid, 100 mg of Solu-Cortef and 25 mg of albumin, patient still is still hypotensive.  Levophed is started.  Data reviewed independently and ED Course: pt was found to have WBC 13.5, lactic acid 3.9, positive urinalysis (turbid appearance, moderate amount of leukocyte, many bacteria, WBC> 50), INR 1.6, PTT 35, AKI with creatinine 4.01, BUN 59, GFR 14 (baseline creatinine 0.7 on 03/02/2022), temperature one 1.7, heart rate 124, 111, RR 33, oxygen saturation 100% on room air.  Chest x-ray showed vascular congestion and right hemidiaphragm elevation.  CT of abdomen/pelvis negative for acute findings, no hydronephrosis.  No biliary ductal dilation.  Patient is admitted to stepdown as inpatient.  Dr. Karna Christmas of ICU was consulted.   CT-abd/pelvis 1. No acute findings in the abdomen or pelvis. 2. Infrarenal abdominal aortic aneurysm, measuring up to 5.3 cm, unchanged when compared with prior. Recommend follow-up CT/MR every 6 months and vascular consultation. This recommendation follows ACR consensus guidelines: White Paper of the ACR Incidental Findings Committee II on Vascular Findings. J Am Coll Radiol 2013; 83:662-947. 3. Aortic Atherosclerosis (ICD10-I70.0).   EKG: I have personally reviewed.  Sinus rhythm, QTc 510, frequent PVC, early R wave progression, right bundle blockade.   Review of Systems: Could not reviewed due to altered mental status.    Allergy:  Allergies  Allergen Reactions   Aspirin Rash, Other (See Comments) and Hives    Reaction: Trouble breathing     Past Medical History:  Diagnosis Date   (HFpEF) heart failure with preserved ejection fraction (HCC)    a. 03/2018 Echo: EF 60-65%, Gr1 DD.   Aortic atherosclerosis (HCC) 05/2013   Per TEE   Arrhythmia    Asthma    CHF (congestive heart failure) (HCC)    Colon polyp    Coronary artery disease    a. Nonobstructive by 2004 cath b. 05/2013 NSTEMI/Cath: >M 30, LAD 47m, LCX 108m, RCA 30p, EF 60%, ? Sev AS (mod by TEE).   GERD (gastroesophageal reflux disease)    Heart murmur    Hiatal hernia    Hyperlipidemia    Hypertension    Lymphedema    Mitral regurgitation    a. 03/2018 Echo: Mild to mod MR.   Obesity    Onychomycosis  Osteoarthritis    Recurrent cellulitis of lower extremity    Severe aortic stenosis    a. 2013 Echo: EF 55%, mod AS; b. TEE 05/2013: EF EF 60-65%, mod AS; c.10/2014 Echo: 55-60%, mod AS; d. 10/2016 Echo: EF 65-70%, Mod-Sev AS; e. 03/2018 [facility]: EF 60-65%, Sev AS, mild to mod AI. Mean grad (S) , Valve Area (VTI): 0.92cm^2.    Past Surgical History:  Procedure Laterality Date   CARDIAC CATHETERIZATION  11/2002   CARDIAC  CATHETERIZATION  05/21/2013   showing 30% oLM, 30% D1, 20% mLCx, 30% pRCA; EF 60%, severe AS (mean gradient 28 mmHg, peak 26, area 0.9)   CHOLECYSTECTOMY  2001   TRANSESOPHAGEAL ECHOCARDIOGRAM  05/22/2013   Preserved EF, moderate AS (valve area by planimetry between 1.0-1.26 cm sq), moderate aortic arch and descending aortic atherosclerosis.    TRANSTHORACIC ECHOCARDIOGRAM  05/19/2013   EF 60-65%, impaired diastolic function, mild LA dilatation, mild MR/AI/TR, mod AS, high normal RVSP (30.4 mmHg)    Social History:  reports that he quit smoking about 41 years ago. His smoking use included cigarettes. He has a 30.00 pack-year smoking history. He has never used smokeless tobacco. He reports that he does not drink alcohol and does not use drugs.  Family History:  Family History  Problem Relation Age of Onset   Heart disease Mother    Heart disease Father 41   Stroke Brother      Prior to Admission medications   Medication Sig Start Date End Date Taking? Authorizing Provider  clopidogrel (PLAVIX) 75 MG tablet TAKE 1 TABLET(75 MG) BY MOUTH DAILY Patient taking differently: Take 75 mg by mouth daily. 06/03/21  Yes Alver Sorrow, NP  folic acid (FOLVITE) 1 MG tablet Take 1 tablet (1 mg total) by mouth daily. 03/03/22 Apr 03, 2022 Yes Pudota, Elsie Ra, MD  metoprolol tartrate (LOPRESSOR) 25 MG tablet Take 25 mg by mouth daily.   Yes [provider]  Multiple Vitamin (MULTIVITAMIN WITH MINERALS) TABS tablet Take 1 tablet by mouth daily. 03/03/22 04-03-2022 Yes Pudota, Elsie Ra, MD  nystatin powder Apply 1 Application topically 2 (two) times daily.   Yes [provider]  oxycodone (OXY-IR) 5 MG capsule Take 5 mg by mouth every 4 (four) hours as needed for pain.   Yes [provider]  polyethylene glycol (MIRALAX / GLYCOLAX) 17 g packet Take 17 g by mouth daily.   Yes [provider]  QUEtiapine (SEROQUEL) 25 MG tablet Take 1 tablet (25 mg total) by mouth at bedtime.  03/02/22 04/01/22 Yes Pudota, Elsie Ra, MD  rosuvastatin (CRESTOR) 20 MG tablet Take 1 tablet (20 mg total) by mouth daily. 03/03/22 Apr 03, 2022 Yes Pudota, Elsie Ra, MD  sennosides-docusate sodium (SENOKOT-S) 8.6-50 MG tablet Take 1 tablet by mouth daily.   Yes [provider]  thiamine (VITAMIN B-1) 100 MG tablet Take 1 tablet (100 mg total) by mouth daily. 03/03/22 04/03/22 Yes Pudota, Elsie Ra, MD  zinc sulfate 220 (50 Zn) MG capsule Take 220 mg by mouth daily.   Yes [provider]  metoprolol succinate (TOPROL-XL) 25 MG 24 hr tablet TAKE 1 TABLET(25 MG) BY MOUTH DAILY Patient taking differently: Take 25 mg by mouth daily. 04/14/21   Alver Sorrow, NP    Physical Exam: Vitals:   04/01/2022 1400 04/08/2022 1415 03/22/2022 1430 03/20/2022 1442  BP: (!) 93/47 (!) 84/46 (!) 81/41 (!) 78/41  Pulse: 94 93 (!) 53 94  Resp: 11 10 11 11   Temp:  TempSrc:      SpO2: 97% 98% 99% 97%  Weight:      Height:       General: Not in acute distress HEENT:       Eyes: PERRL, EOMI, no scleral icterus.       ENT: No discharge from the ears and nose       Neck: No JVD, no bruit, no mass felt. Heme: No neck lymph node enlargement. Cardiac: S1/S2, RRR, No gallops or rubs. Respiratory: No rales, wheezing, rhonchi or rubs. GI: Soft, nondistended, nontender, no organomegaly, BS present. GU: has hematuria Ext: 1+ pitting leg edema bilaterally.  Has chronic venous insufficiency change in lymphedema.  1+DP/PT pulse bilaterally. Musculoskeletal: No joint deformities, No joint redness or warmth, no limitation of ROM in spin. Skin: No rashes.  Neuro: Altered mental status, barely arousable, not following command, not oriented X3, cranial nerves II-XII grossly intact,  Psych: Patient is not psychotic, no suicidal or hemocidal ideation.  Labs on Admission: I have personally reviewed following labs and imaging studies  CBC: Recent Labs  Lab 01-Apr-2022 0845  WBC 13.5*  NEUTROABS 12.6*   HGB 13.1  HCT 40.1  MCV 83.2  PLT 154   Basic Metabolic Panel: Recent Labs  Lab April 01, 2022 0845  NA 143  K 4.3  CL 108  CO2 24  GLUCOSE 61*  BUN 59*  CREATININE 4.01*  CALCIUM 8.3*   GFR: Estimated Creatinine Clearance: 15 mL/min (A) (by C-G formula based on SCr of 4.01 mg/dL (H)). Liver Function Tests: Recent Labs  Lab 2022-04-01 0845  AST 65*  ALT 29  ALKPHOS 171*  BILITOT 3.1*  PROT 6.1*  ALBUMIN 2.6*   No results for input(s): "LIPASE", "AMYLASE" in the last 168 hours. No results for input(s): "AMMONIA" in the last 168 hours. Coagulation Profile: Recent Labs  Lab 04/01/2022 0845  INR 1.6*   Cardiac Enzymes: Recent Labs  Lab 2022-04-01 0844  CKTOTAL 484*   BNP (last 3 results) No results for input(s): "PROBNP" in the last 8760 hours. HbA1C: No results for input(s): "HGBA1C" in the last 72 hours. CBG: Recent Labs  Lab April 01, 2022 1201 Apr 01, 2022 1341  GLUCAP 56* 97   Lipid Profile: No results for input(s): "CHOL", "HDL", "LDLCALC", "TRIG", "CHOLHDL", "LDLDIRECT" in the last 72 hours. Thyroid Function Tests: No results for input(s): "TSH", "T4TOTAL", "FREET4", "T3FREE", "THYROIDAB" in the last 72 hours. Anemia Panel: No results for input(s): "VITAMINB12", "FOLATE", "FERRITIN", "TIBC", "IRON", "RETICCTPCT" in the last 72 hours. Urine analysis:    Component Value Date/Time   COLORURINE YELLOW (A) 04/01/22 0844   APPEARANCEUR TURBID (A) 04-01-2022 0844   LABSPEC 1.014 01-Apr-2022 0844   PHURINE 7.0 04/01/22 0844   GLUCOSEU NEGATIVE 01-Apr-2022 0844   HGBUR SMALL (A) 04/01/22 0844   BILIRUBINUR NEGATIVE 04-01-22 0844   KETONESUR 5 (A) 04/01/22 0844   PROTEINUR >=300 (A) 04-01-22 0844   NITRITE NEGATIVE 04/01/22 0844   LEUKOCYTESUR MODERATE (A) 04/01/2022 0844   Sepsis Labs: @LABRCNTIP (procalcitonin:4,lacticidven:4) ) Recent Results (from the past 240 hour(s))  Resp Panel by RT-PCR (Flu A&B, Covid) Anterior Nasal Swab     Status: Abnormal    Collection Time: 2022-04-01  8:44 AM   Specimen: Anterior Nasal Swab  Result Value Ref Range Status   SARS Coronavirus 2 by RT PCR POSITIVE (A) NEGATIVE Final    Comment: (NOTE) SARS-CoV-2 target nucleic acids are DETECTED.  The SARS-CoV-2 RNA is generally detectable in upper respiratory specimens during the acute phase of infection. Positive  results are indicative of the presence of the identified virus, but do not rule out bacterial infection or co-infection with other pathogens not detected by the test. Clinical correlation with patient history and other diagnostic information is necessary to determine patient infection status. The expected result is Negative.  Fact Sheet for Patients: BloggerCourse.com  Fact Sheet for Healthcare Providers: SeriousBroker.it  This test is not yet approved or cleared by the [place] FDA and  has been authorized for detection and/or diagnosis of SARS-CoV-2 by FDA under an Emergency Use Authorization (EUA).  This EUA will remain in effect (meaning this test can be used) for the duration of  the COVID-19 declaration under Section 564(b)(1) of the A ct, 21 U.S.C. section 360bbb-3(b)(1), unless the authorization is terminated or revoked sooner.     Influenza A by PCR NEGATIVE NEGATIVE Final   Influenza B by PCR NEGATIVE NEGATIVE Final    Comment: (NOTE) The Xpert Xpress SARS-CoV-2/FLU/RSV plus assay is intended as an aid in the diagnosis of influenza from Nasopharyngeal swab specimens and should not be used as a sole basis for treatment. Nasal washings and aspirates are unacceptable for Xpert Xpress SARS-CoV-2/FLU/RSV testing.  Fact Sheet for Patients: BloggerCourse.com  Fact Sheet for Healthcare Providers: SeriousBroker.it  This test is not yet approved or cleared by the [place] FDA and has been authorized for detection and/or  diagnosis of SARS-CoV-2 by FDA under an Emergency Use Authorization (EUA). This EUA will remain in effect (meaning this test can be used) for the duration of the COVID-19 declaration under Section 564(b)(1) of the Act, 21 U.S.C. section 360bbb-3(b)(1), unless the authorization is terminated or revoked.  Performed at [facility], [address] [place], [place] [address]      Radiological Exams on Admission: CT ABDOMEN PELVIS WO CONTRAST  Result Date: April 17, 2022 CLINICAL DATA:  Sepsis EXAM: CT ABDOMEN AND PELVIS WITHOUT CONTRAST TECHNIQUE: Multidetector CT imaging of the abdomen and pelvis was performed following the standard protocol without IV contrast. RADIATION DOSE REDUCTION: This exam was performed according to the departmental dose-optimization program which includes automated exposure control, adjustment of the mA and/or kV according to patient size and/or use of iterative reconstruction technique. COMPARISON:  CT abdomen and pelvis dated February 24, 2022 FINDINGS: Lower chest: Right-greater-than-left bibasilar atelectasis. Coronary artery calcifications aortic valve calcifications. Hepatobiliary: No focal liver abnormality is seen. Status post cholecystectomy. No biliary dilatation. Pancreas: Unremarkable. No pancreatic ductal dilatation or surrounding inflammatory changes. Spleen: Normal in size without focal abnormality. Adrenals/Urinary Tract: Bilateral adrenal glands are unremarkable. No hydronephrosis. Bilateral renal vascular calcifications with no definite nephrolithiasis. Indeterminate exophytic lesion the interpolar region of the left kidney measuring 10 mm on series 3, image 37, simple appearing on renal ultrasound dated February 25, 2022 and likely a proteinaceous cyst. Additional simple appearing renal cysts are seen bilaterally. No specific follow-up imaging is recommended for renal lesions. Bladder is decompressed and contains a Foley catheter. Stomach/Bowel: Stomach  is within normal limits. Appendix appears normal. No evidence of bowel wall thickening, distention, or inflammatory changes. Vascular/Lymphatic: Atherosclerotic disease of the abdominal aorta infrarenal aortic aneurysm, measuring up to 5.3 cm, unchanged when compared with prior. Unchanged bilateral common iliac and left internal iliac artery aneurysms, largest is a left common iliac artery aneurysm measuring up to 4.3 cm. Reproductive: Calcifications of the prostate. Other: No abdominal wall hernia or abnormality. No abdominopelvic ascites. Musculoskeletal: No acute or significant osseous findings. IMPRESSION: 1. No acute findings in the abdomen or pelvis. 2. Infrarenal  abdominal aortic aneurysm, measuring up to 5.3 cm, unchanged when compared with prior. Recommend follow-up CT/MR every 6 months and vascular consultation. This recommendation follows ACR consensus guidelines: White Paper of the ACR Incidental Findings Committee II on Vascular Findings. J Am Coll Radiol 2013; 40:814-481. 3. Aortic Atherosclerosis (ICD10-I70.0). Electronically Signed   By: Allegra Lai M.D.   On: Apr 10, 2022 10:46   DG Chest Port 1 View  Result Date: 2022-04-10 CLINICAL DATA:  Questionable sepsis EXAM: PORTABLE CHEST 1 VIEW COMPARISON:  Portable exam 0850 hours compared to 02/24/2022 FINDINGS: Enlargement of cardiac silhouette with pulmonary vascular congestion. Atherosclerotic calcification aorta. Chronic eventration of RIGHT diaphragm with RIGHT basilar atelectasis. Remaining lungs clear. No acute infiltrate, pleural effusion, or pneumothorax. Osseous structures unremarkable. IMPRESSION: Enlargement of cardiac silhouette with pulmonary vascular congestion. Persistent eventration RIGHT diaphragm with RIGHT basilar atelectasis. Aortic Atherosclerosis (ICD10-I70.0). Electronically Signed   By: Ulyses Southward M.D.   On: 04-10-2022 09:02      Assessment/Plan Principal Problem:   Severe sepsis with septic shock (CODE)  (HCC) Active Problems:   Complicated UTI (urinary tract infection)   Acute kidney injury (HCC)   COVID-19 virus infection   Hyperlipidemia   Hypoglycemia   Chronic diastolic heart failure (HCC)   Acute metabolic encephalopathy   Coronary artery disease, non-occlusive   Hypertension   Hyperbilirubinemia   Depression   Assessment and Plan:  Severe sepsis with septic shock (CODE) due to complicated UTI (urinary tract infection): pt has persistent hypotension. After giving 4 L IV fluid, 100 mg of Solu-Cortef and 25 mg of albumin, patient still is still hypotensive.  Levophed is started. Consulted Dr. Karna Christmas of ICU  -admit to ICU  -Meropenem IV (patient received 1 dose of cefepime in ED) -Follow-up blood culture and urine culture -will get Procalcitonin and trend lactic acid levels per sepsis protocol. -IVF: total of 4L of IVF in ED - Levophed infusion  Acute kidney injury [facility]):  likely due to UTI. ATN is also possible due to hypotension.  CT scan negative for hydronephrosis. -IV fluid as above -Avoid using renal toxic medications  COVID-19 virus infection: Patient seems to be asymptomatic.  No infiltration on chest x-ray.  Given septic shock, will start antibiotics when patient is able to take. -Molnupiravir -As needed bronchodilators  Acute metabolic encephalopathy: Likely due to sepsis and UTI. -Frequent neurochecks -Follow-up CT of head -Hold all oral medications until mental status improves  Hyperlipidemia -Hold Crestor until mental status improves  Hypoglycemia: Blood sugar 61 by BMI of -As needed D50 -Check CBG every 2 hours  Chronic diastolic heart failure (HCC): 2D echo on 02/26/2022 showed EF of 55 to 60%.  Patient has 1+ leg edema, elevated BNP 1530, indicating fluid overload. -Cannot give diuretics due to septic shock  Coronary artery disease, non-occlusive -Hold Crestor and Plavix until mental status improves  Hypertension -Hold blood pressure  medications  Hyperbilirubinemia: This is chronic issue.  Today total bilirubin 0.1, AST 65, ALT 29, ALP 171.  CT scan is negative for biliary ductal dilation. -Follow-up with back CMAP  Depression -Hold Seroquel until mental status improves    DVT ppx: SQ Heparin    Code Status: Full code per his daughter in law  Family Communication:  Yes, patient's  daughter in law  at bed side.      Disposition Plan:  Anticipate discharge back to previous environment  Consults called:  Dr. Karna Christmas of ICU  Admission status and Level of care: Stepdown:   as inpt  Dispo: The patient is from: SNF              Anticipated d/c is to: SNF              Anticipated d/c date is: 3 days              Patient currently is not medically stable to d/c.    Severity of Illness:  The appropriate patient status for this patient is INPATIENT. Inpatient status is judged to be reasonable and necessary in order to provide the required intensity of service to ensure the patient's safety. The patient's presenting symptoms, physical exam findings, and initial radiographic and laboratory data in the context of their chronic comorbidities is felt to place them at high risk for further clinical deterioration. Furthermore, it is not anticipated that the patient will be medically stable for discharge from the hospital within 2 midnights of admission.   * I certify that at the point of admission it is my clinical judgment that the patient will require inpatient hospital care spanning beyond 2 midnights from the point of admission due to high intensity of service, high risk for further deterioration and high frequency of surveillance required.*       Date of Service 03/19/2022    Lorretta HarpXilin Lavere Shinsky [facility]   If 7PM-7AM, please contact night-coverage [email or web address] 03/19/2022, 3:23 PM

## 2022-03-24 NOTE — ED Triage Notes (Addendum)
BIB [facility] from [facility] in [place] for AMS, found by staff this am with fever, tachypnea, strong malodorous urine, "foley came out, and unable to be put back in". EMS reports CO2 24, BP 84/39, HR 115, RR 34. Pt arrives the same, following some commands, confused, oriented to self, restless. Abd distended. SPO2 90% on 2L, intermittently low.

## 2022-03-25 DIAGNOSIS — R6521 Severe sepsis with septic shock: Secondary | ICD-10-CM | POA: Diagnosis not present

## 2022-03-25 DIAGNOSIS — Z7189 Other specified counseling: Secondary | ICD-10-CM

## 2022-03-25 LAB — BLOOD CULTURE ID PANEL (REFLEXED) - BCID2

## 2022-03-25 LAB — COMPREHENSIVE METABOLIC PANEL
ALT: 28 U/L (ref 0–44)
AST: 59 U/L — ABNORMAL HIGH (ref 15–41)
Albumin: 2.3 g/dL — ABNORMAL LOW (ref 3.5–5.0)
Alkaline Phosphatase: 98 U/L (ref 38–126)
Anion gap: 11 (ref 5–15)
BUN: 59 mg/dL — ABNORMAL HIGH (ref 8–23)
CO2: 21 mmol/L — ABNORMAL LOW (ref 22–32)
Calcium: 7.7 mg/dL — ABNORMAL LOW (ref 8.9–10.3)
Chloride: 111 mmol/L (ref 98–111)
Creatinine, Ser: 2.97 mg/dL — ABNORMAL HIGH (ref 0.61–1.24)
GFR, Estimated: 19 mL/min — ABNORMAL LOW (ref >60)
Glucose, Bld: 123 mg/dL — ABNORMAL HIGH (ref 70–99)
Potassium: 4.4 mmol/L (ref 3.5–5.1)
Sodium: 143 mmol/L (ref 135–145)
Total Bilirubin: 2.6 mg/dL — ABNORMAL HIGH (ref 0.3–1.2)
Total Protein: 5.4 g/dL — ABNORMAL LOW (ref 6.5–8.1)

## 2022-03-25 LAB — CBC
HCT: 34 % — ABNORMAL LOW (ref 39.0–52.0)
Hemoglobin: 11.3 g/dL — ABNORMAL LOW (ref 13.0–17.0)
MCH: 27.4 pg (ref 26.0–34.0)
MCHC: 33.2 g/dL (ref 30.0–36.0)
MCV: 82.5 fL (ref 80.0–100.0)
Platelets: 122 10*3/uL — ABNORMAL LOW (ref 150–400)
RBC: 4.12 MIL/uL — ABNORMAL LOW (ref 4.22–5.81)
RDW: 17 % — ABNORMAL HIGH (ref 11.5–15.5)
WBC: 21.2 10*3/uL — ABNORMAL HIGH (ref 4.0–10.5)
nRBC: 0 % (ref 0.0–0.2)

## 2022-03-25 LAB — C-REACTIVE PROTEIN: CRP: 24 mg/dL — ABNORMAL HIGH (ref <1.0)

## 2022-03-25 LAB — BLOOD GAS, VENOUS
Acid-base deficit: 1.1 mmol/L (ref 0.0–2.0)
Bicarbonate: 23.5 mmol/L (ref 20.0–28.0)
O2 Saturation: 74.7 %
Patient temperature: 37
pCO2, Ven: 38 mmHg — ABNORMAL LOW (ref 44–60)
pH, Ven: 7.4 (ref 7.25–7.43)
pO2, Ven: 42 mmHg (ref 32–45)

## 2022-03-25 LAB — FERRITIN: Ferritin: 1018 ng/mL — ABNORMAL HIGH (ref 24–336)

## 2022-03-25 LAB — CK: Total CK: 322 U/L (ref 49–397)

## 2022-03-25 LAB — GLUCOSE, CAPILLARY
Glucose-Capillary: 113 mg/dL — ABNORMAL HIGH (ref 70–99)
Glucose-Capillary: 120 mg/dL — ABNORMAL HIGH (ref 70–99)
Glucose-Capillary: 112 mg/dL — ABNORMAL HIGH (ref 70–99)
Glucose-Capillary: 118 mg/dL — ABNORMAL HIGH (ref 70–99)
Glucose-Capillary: 135 mg/dL — ABNORMAL HIGH (ref 70–99)
Glucose-Capillary: 139 mg/dL — ABNORMAL HIGH (ref 70–99)
Glucose-Capillary: 160 mg/dL — ABNORMAL HIGH (ref 70–99)

## 2022-03-25 LAB — LACTIC ACID, PLASMA: Lactic Acid, Venous: 1.9 mmol/L (ref 0.5–1.9)

## 2022-03-25 LAB — D-DIMER, QUANTITATIVE: D-Dimer, Quant: 10.16 ug/mL-FEU — ABNORMAL HIGH (ref 0.00–0.50)

## 2022-03-25 LAB — PROCALCITONIN: Procalcitonin: 18.5 ng/mL

## 2022-03-25 LAB — PHOSPHORUS: Phosphorus: 4.2 mg/dL (ref 2.5–4.6)

## 2022-03-25 LAB — MAGNESIUM: Magnesium: 2 mg/dL (ref 1.7–2.4)

## 2022-03-25 MED ORDER — HALOPERIDOL LACTATE 5 MG/ML IJ SOLN
5.0000 mg | Freq: Four times a day (QID) | INTRAMUSCULAR | Status: DC | PRN
  Administered 2022-03-25: 5 mg via INTRAVENOUS

## 2022-03-25 MED ORDER — SODIUM CHLORIDE 0.9 % IV SOLN
2.0000 g | INTRAVENOUS | Status: DC
  Administered 2022-03-25 – 2022-03-29 (×5): 2 g via INTRAVENOUS
  Filled 2022-03-25: qty 2
  Filled 2022-03-25: qty 20
  Filled 2022-03-25: qty 2
  Filled 2022-03-25 (×2): qty 20

## 2022-03-25 MED ORDER — NOREPINEPHRINE BITARTRATE 1 MG/ML IV SOLN
0.0000 ug/min | INTRAVENOUS | Status: DC
  Administered 2022-03-25: 3 ug/min via INTRAVENOUS
  Filled 2022-03-25: qty 16

## 2022-03-25 MED ORDER — DEXMEDETOMIDINE HCL IN NACL 400 MCG/100ML IV SOLN
0.4000 ug/kg/h | INTRAVENOUS | Status: DC
  Administered 2022-03-25: 0.6 ug/kg/h via INTRAVENOUS
  Administered 2022-03-25: 0.4 ug/kg/h via INTRAVENOUS
  Administered 2022-03-26: 0.3 ug/kg/h via INTRAVENOUS
  Administered 2022-03-26: 0.4 ug/kg/h via INTRAVENOUS
  Administered 2022-03-26: 0.6 ug/kg/h via INTRAVENOUS
  Administered 2022-03-27 – 2022-03-28 (×4): 0.3 ug/kg/h via INTRAVENOUS
  Filled 2022-03-25 (×7): qty 100
  Filled 2022-03-25: qty 200

## 2022-03-25 NOTE — Evaluation (Signed)
Occupational Therapy Evaluation Patient Details Name: [name] MRN: [id number] DOB: 10/19/1930 Today's Date: 03/25/2022   History of Present Illness Patient is a 88 year old admitted from the nursing home with acute metabolic encephalopathy and severe sepsis with septic shock in setting of UTI & COVID-19 infection.  Also found to have AKI.   Clinical Impression   Chart reviewed, pt is oriented to self only. Pt presents with deficits in strength, endurance, activity tolerance, all affecting safe and optimal ADL completion. Pt with stable blood pressure with bed put into semi chair position. Pt inconsistently following 1 step directions, therapeutic use of self provided for calming. Recommend discharge back to STR to address functional deficits. OT will continue to follow acutely.        Recommendations for follow up therapy are one component of a multi-disciplinary discharge planning process, led by the attending physician.  Recommendations may be updated based on patient status, additional functional criteria and insurance authorization.   Follow Up Recommendations  Skilled nursing-short term rehab (<3 hours/[facility])    Assistance Recommended at Discharge Frequent or constant Supervision/Assistance  Patient can return home with the following Two people to help with bathing/dressing/bathroom;A lot of help with bathing/dressing/bathroom    Functional Status Assessment  Patient has had a recent decline in their functional status and demonstrates the ability to make significant improvements in function in a reasonable and predictable amount of time.  Equipment Recommendations  Other (comment) (defer)    Recommendations for Other Services       Precautions / Restrictions Precautions Precautions: Fall Restrictions Weight Bearing Restrictions: No      Mobility Bed Mobility Overal bed mobility: Needs Assistance             General bed mobility comments: Semi chair position, Pt  required TOTAL A +2 for long sitting attempts- pt is resistive to movement despite multi modal cueing    Transfers                          Balance                                           ADL either performed or assessed with clinical judgement   ADL Overall ADL's : Needs assistance/impaired     Grooming: Maximal assistance           Upper Body Dressing : Maximal assistance Upper Body Dressing Details (indicate cue type and reason): anticipated Lower Body Dressing: Total assistance Lower Body Dressing Details (indicate cue type and reason): anticipated                     Vision   Additional Comments: will continue to assess     Perception     Praxis      Pertinent Vitals/Pain Pain Assessment Pain Assessment: CPOT Facial Expression: Tense Body Movements: Restlessness Muscle Tension: Relaxed Compliance with ventilator (intubated pts.): N/A Vocalization (extubated pts.): Talking in normal tone or no sound CPOT Total: 3     Hand Dominance     Extremity/Trunk Assessment Upper Extremity Assessment Upper Extremity Assessment: Difficult to assess due to impaired cognition;Generalized weakness   Lower Extremity Assessment Lower Extremity Assessment: Difficult to assess due to impaired cognition;Generalized weakness       Communication     Cognition Arousal/Alertness: Awake/alert Behavior During Therapy: Anxious,  Agitated Overall Cognitive Status: No family/caregiver present to determine baseline cognitive functioning Area of Impairment: Orientation, Attention, Memory, Following commands, Safety/judgement, Awareness, Problem solving                 Orientation Level: Disoriented to, Place, Time Current Attention Level: Focused Memory: Decreased recall of precautions, Decreased short-term memory Following Commands: Follows one step commands inconsistently Safety/Judgement: Decreased awareness of safety, Decreased  awareness of deficits Awareness: Intellectual Problem Solving: Slow processing, Decreased initiation, Difficulty sequencing, Requires verbal cues, Requires tactile cues       General Comments  MAP monitored throughout- 80s-low 90s, all lines/leads intact throughout and after session    Exercises     Shoulder Instructions      Home Living Family/patient expects to be discharged to:: Skilled nursing facility                                        Prior Functioning/Environment Prior Level of Function : Patient poor historian/Family not available             Mobility Comments: per recent notes, patient was Mod I for short distance ambulation. patient was admitted here from SNF, presumably for rehabilitation ADLs Comments: pt unable to provide PLOF, chart reports pt at rehab, requiring assist for ADL        OT Problem List: Decreased strength;Decreased activity tolerance;Decreased knowledge of use of DME or AE;Decreased safety awareness      OT Treatment/Interventions: Self-care/ADL training;Patient/family education;Therapeutic exercise;DME and/or AE instruction;Therapeutic activities    OT Goals(Current goals can be found in the care plan section) Acute Rehab OT Goals OT Goal Formulation: Patient unable to participate in goal setting ADL Goals Pt Will Perform Grooming: sitting;with min assist Pt Will Transfer to Toilet: with mod assist Pt Will Perform Toileting - Clothing Manipulation and hygiene: with mod assist  OT Frequency: Min 2X/week    Co-evaluation PT/OT/SLP Co-Evaluation/Treatment: Yes Reason for Co-Treatment: Necessary to address cognition/behavior during functional activity;For patient/therapist safety;To address functional/ADL transfers   OT goals addressed during session: ADL's and self-care      AM-PAC OT "6 Clicks" Daily Activity     Outcome Measure Help from another person eating meals?: Total Help from another person taking care of  personal grooming?: Total Help from another person toileting, which includes using toliet, bedpan, or urinal?: Total Help from another person bathing (including washing, rinsing, drying)?: Total Help from another person to put on and taking off regular upper body clothing?: Total Help from another person to put on and taking off regular lower body clothing?: Total 6 Click Score: 6   End of Session Nurse Communication: Mobility status  Activity Tolerance: Treatment limited secondary to agitation Patient left: in bed;with call bell/phone within reach;with bed alarm set  OT Visit Diagnosis: Muscle weakness (generalized) (M62.81)                Time: 5284-1324 OT Time Calculation (min): 22 min Charges:  OT General Charges $OT Visit: 1 Visit OT Evaluation $OT Eval High Complexity: 1 High Oleta Mouse, OTD OTR/L  03/25/22, 4:07 PM

## 2022-03-25 NOTE — Progress Notes (Signed)
Updated pt's daughter via telephone regarding clinical course and plan of care.  All questions answered.  She is very appreciative of update.   Harlon Ditty, AGACNP-BC [facility] Prefer epic messenger for cross cover needs If after hours, please call E-link

## 2022-03-25 NOTE — Progress Notes (Signed)
Pt is A&O x1 to self only.... removed bilateral mitts and took out PIV on LU arm w/minimal blood loss...Marland Kitchen pt is lethargic and not following commands w/repetitive sentences.... Placed bilateral mitts back and Haladol 5 mg given as per Debera Lat, NP.

## 2022-03-25 NOTE — Evaluation (Signed)
Physical Therapy Evaluation Patient Details Name: Gabrielle Ragsdale MRN: [id number] DOB: Jun 20, 1930 Today's Date: 03/25/2022  History of Present Illness  Patient is a 88 year old admitted from the nursing home with acute metabolic encephalopathy and severe sepsis with septic shock in setting of UTI & COVID-19 infection.  Also found to have AKI.  Clinical Impression  Patient seen for PT evaluation. He currently is having difficulty following single step commands. Bed placed in semi chair position with no significant change in MAP noted. He is intermittently agitated, trying to pull mitts off. He is redirected for brief periods. The patient is resistive to all mobility attempts with total assistance required for long sitting attempts with bed in chair position. He reports having pain "all over" and moans with any attempts at PROM of BLE. Recommend PT follow up as patient able to participate, in attempts to maximize independence and decrease caregiver burden. SNF recommended at discharge.      Recommendations for follow up therapy are one component of a multi-disciplinary discharge planning process, led by the attending physician.  Recommendations may be updated based on patient status, additional functional criteria and insurance authorization.  Follow Up Recommendations Skilled nursing-short term rehab (<3 hours/[place]) Can patient physically be transported by private vehicle: No    Assistance Recommended at Discharge Frequent or constant Supervision/Assistance  Patient can return home with the following  Two people to help with walking and/or transfers;Two people to help with bathing/dressing/bathroom;Assistance with feeding;Help with stairs or ramp for entrance;Assist for transportation    Equipment Recommendations None recommended by PT (to be determined at next level of care)  Recommendations for Other Services       Functional Status Assessment Patient has had a recent decline in their functional  status and demonstrates the ability to make significant improvements in function in a reasonable and predictable amount of time.     Precautions / Restrictions Precautions Precautions: Fall Restrictions Weight Bearing Restrictions: No      Mobility  Bed Mobility Overal bed mobility: Needs Assistance             General bed mobility comments: bed placed in semi-chair position. total assistance for any attempts at long sitting in bed. patient is resistive with movement despite cues for participation. unsafe to dangle at the edge of bed today due to overall cognition    Transfers                        Ambulation/Gait                  Stairs            Wheelchair Mobility    Modified Rankin (Stroke Patients Only)       Balance                                             Pertinent Vitals/Pain Pain Assessment Pain Assessment: Faces Faces Pain Scale: Hurts whole lot Pain Location: "all over". grimicing with bed mobility attempts and ROM of BLE Pain Descriptors / Indicators: Discomfort, Grimacing    Home Living Family/patient expects to be discharged to:: Skilled nursing facility                        Prior Function Prior Level of Function : Patient poor historian/Family not  available             Mobility Comments: per recent notes, patient was Mod I for short distance ambulation. patient was admitted here from SNF, presumably for rehabilitation       Hand Dominance        Extremity/Trunk Assessment   Upper Extremity Assessment Upper Extremity Assessment: Difficult to assess due to impaired cognition;Generalized weakness    Lower Extremity Assessment Lower Extremity Assessment: Generalized weakness;Difficult to assess due to impaired cognition (pain with any PROM attempts. noted patient actively dorsiflexing but not to command. toe nails are very long on the right)       Communication       Cognition Arousal/Alertness: Awake/alert Behavior During Therapy: Anxious, Agitated Overall Cognitive Status: No family/caregiver present to determine baseline cognitive functioning                                 General Comments: patient has difficulty following one step commands despite increased time. he is often resistive to movement. he seems intermittently agitated, trying to pull mitts off. unable to state his name and appears overall disoriented        General Comments General comments (skin integrity, edema, etc.): MAP in 80's- low 90's during session and was monitored throughout    Exercises     Assessment/Plan    PT Assessment Patient needs continued PT services  PT Problem List Decreased strength;Decreased range of motion;Decreased activity tolerance;Decreased mobility;Decreased balance;Decreased safety awareness       PT Treatment Interventions DME instruction;Gait training;Stair training;Functional mobility training;Therapeutic activities;Therapeutic exercise;Neuromuscular re-education;Balance training;Cognitive remediation;Patient/family education    PT Goals (Current goals can be found in the Care Plan section)  Acute Rehab PT Goals Patient Stated Goal: unable to state PT Goal Formulation: Patient unable to participate in goal setting Time For Goal Achievement: 04/08/22 Potential to Achieve Goals: Fair    Frequency Min 2X/week     Co-evaluation               AM-PAC PT "6 Clicks" Mobility  Outcome Measure Help needed turning from your back to your side while in a flat bed without using bedrails?: Total Help needed moving from lying on your back to sitting on the side of a flat bed without using bedrails?: Total Help needed moving to and from a bed to a chair (including a wheelchair)?: Total Help needed standing up from a chair using your arms (e.g., wheelchair or bedside chair)?: Total Help needed to walk in hospital room?: Total Help  needed climbing 3-5 steps with a railing? : Total 6 Click Score: 6    End of Session   Activity Tolerance: Patient limited by fatigue;Treatment limited secondary to agitation Patient left: in bed;with bed alarm set   PT Visit Diagnosis: Unsteadiness on feet (R26.81);Muscle weakness (generalized) (M62.81)    Time: 8889-1694 PT Time Calculation (min) (ACUTE ONLY): 22 min   Charges:   PT Evaluation $PT Eval High Complexity: 1 High          Donna Bernard, PT, MPT   Ina Homes 03/25/2022, 3:13 PM

## 2022-03-25 NOTE — Progress Notes (Signed)
PHARMACY - PHYSICIAN COMMUNICATION CRITICAL VALUE ALERT - BLOOD CULTURE IDENTIFICATION (BCID)  [name] is an 88 y.o. male who presented to [facility] on 2022-03-25 with a chief complaint of Sepsis  Assessment:  Kleb pneumo in 4 of 4 bottles, no resistance (include suspected source if known)  Name of physician (or Provider) Contacted: Manuela Schwartz  Current antibiotics: Meropenem 500 mg IV Q12H   Changes to prescribed antibiotics recommended:  Yes, will d/c meropenem and start Ceftriaxone 2 gm IV Q24H   Results for orders placed or performed during the hospital encounter of 03/25/22  Blood Culture ID Panel (Reflexed) (Collected: 03/25/2022  8:45 AM)  Result Value Ref Range   Enterococcus faecalis NOT DETECTED NOT DETECTED   Enterococcus Faecium NOT DETECTED NOT DETECTED   Listeria monocytogenes NOT DETECTED NOT DETECTED   Staphylococcus species NOT DETECTED NOT DETECTED   Staphylococcus aureus (BCID) NOT DETECTED NOT DETECTED   Staphylococcus epidermidis NOT DETECTED NOT DETECTED   Staphylococcus lugdunensis NOT DETECTED NOT DETECTED   Streptococcus species NOT DETECTED NOT DETECTED   Streptococcus agalactiae NOT DETECTED NOT DETECTED   Streptococcus pneumoniae NOT DETECTED NOT DETECTED   Streptococcus pyogenes NOT DETECTED NOT DETECTED   A.calcoaceticus-baumannii NOT DETECTED NOT DETECTED   Bacteroides fragilis NOT DETECTED NOT DETECTED   Enterobacterales DETECTED (A) NOT DETECTED   Enterobacter cloacae complex NOT DETECTED NOT DETECTED   Escherichia coli NOT DETECTED NOT DETECTED   Klebsiella aerogenes NOT DETECTED NOT DETECTED   Klebsiella oxytoca NOT DETECTED NOT DETECTED   Klebsiella pneumoniae DETECTED (A) NOT DETECTED   Proteus species NOT DETECTED NOT DETECTED   Salmonella species NOT DETECTED NOT DETECTED   Serratia marcescens NOT DETECTED NOT DETECTED   Haemophilus influenzae NOT DETECTED NOT DETECTED   Neisseria meningitidis NOT DETECTED NOT DETECTED    Pseudomonas aeruginosa NOT DETECTED NOT DETECTED   Stenotrophomonas maltophilia NOT DETECTED NOT DETECTED   Candida albicans NOT DETECTED NOT DETECTED   Candida auris NOT DETECTED NOT DETECTED   Candida glabrata NOT DETECTED NOT DETECTED   Candida krusei NOT DETECTED NOT DETECTED   Candida parapsilosis NOT DETECTED NOT DETECTED   Candida tropicalis NOT DETECTED NOT DETECTED   Cryptococcus neoformans/gattii NOT DETECTED NOT DETECTED   CTX-M ESBL NOT DETECTED NOT DETECTED   Carbapenem resistance IMP NOT DETECTED NOT DETECTED   Carbapenem resistance KPC NOT DETECTED NOT DETECTED   Carbapenem resistance NDM NOT DETECTED NOT DETECTED   Carbapenem resist OXA 48 LIKE NOT DETECTED NOT DETECTED   Carbapenem resistance VIM NOT DETECTED NOT DETECTED    [name] D 03/25/2022  1:10 AM

## 2022-03-25 NOTE — Progress Notes (Signed)
NAMEEmerson [name], MRN:  [id number], DOB:  [birth year], LOS: 1 ADMISSION DATE:  04/14/2022, CONSULTATION DATE:  04/01/2022 REFERRING MD:  Dr. Blaine Hamper, CHIEF COMPLAINT:  Altered Mental Status, generalized weakness   Brief Pt Description / Synopsis:  88 y.o. Male admitted with Acute Metabolic Encephalopathy and severe sepsis with Septic shock in setting of UTI & COVID-19 infection.  Also found to have AKI.  History of Present Illness:  [name] is a 88 y.o. male with a past medical history significant for indwelling foley catheter, hypertension, hyperlipidemia, asthma, stroke, depression, severe aortic stenosis, mitral valve regurgitation, CAD, HFpEF, hyperbilirubinemia, with who presents to [facility] ED due to altered mental status and generalized weakness.  Pt remains altered and is unable to contribute to history, therefore history is obtained from chart review.  Per ED and nursing notes, he was sent in from his nursing facility due to altered mental status.  Patient's chronic indwelling Foley catheter was removed, and was unable to be replaced at the facility.  He was noted to have fever and chills, and to be incontinent with malodorous urine.   ED Course: Initial Vital Signs: Temperature 99.5 F orally, respiratory rate 36, pulse 118, blood pressure 108/50, SPO2 95% on 2 L nasal cannula Significant Labs: Glucose 61, BUN 59, creatinine 4.0, alkaline phosphatase 171, albumin 2.6, AST 65, ALT 29, total bilirubin 3.1, BNP 1530, CK 484, lactic acid 3.9, procalcitonin 13.9, WBC 13.5 with neutrophilia, INR 1.6, PT 19.3 Urinalysis consistent with UTI COVID-19 PCR is positive Imaging Chest X-ray>>IMPRESSION: Enlargement of cardiac silhouette with pulmonary vascular congestion. Persistent eventration RIGHT diaphragm with RIGHT basilar atelectasis. Aortic Atherosclerosis CT abdomen pelvis without contrast>>IMPRESSION: 1. No acute findings in the abdomen or pelvis. 2. Infrarenal abdominal aortic aneurysm,  measuring up to 5.3 cm, unchanged when compared with prior. Recommend follow-up CT/MR every 6 months and vascular consultation. This recommendation follows ACR consensus guidelines: White Paper of the ACR Incidental Findings Committee II on Vascular Findings. J Am Coll Radiol 2013; 10:789-794. 3. Aortic Atherosclerosis CT head currently pending Medications Administered: 4 L IV fluids, 25 mg albumin, 100 mg Solu-Cortef, IV cefepime, Levophed drip initiated  He met sepsis criteria therefore he was given IV fluid resuscitation along with IV antibiotics including cefepime.  Despite fluid resuscitation he remained hypotensive requiring peripheral Levophed drip.  Hospitalist were asked to admit, PCCM is consulted for assistance with management of shock and vasopressors.  Please see "significant hospital events" section below for full detailed hospital course.  03/25/22- patient is more alert this am but is not appropriate with encephalopathy reaching and pulling on catheters.  He will start PT/OT today.  He is bacteremic with Klebsiella pneumoniae   Pertinent  Medical History   Past Medical History:  Diagnosis Date   (HFpEF) heart failure with preserved ejection fraction ([place])    a. 03/2018 Echo: EF 60-65%, Gr1 DD.   Aortic atherosclerosis ([place]) 05/2013   Per TEE   Arrhythmia    Asthma    CHF (congestive heart failure) (HCC)    Colon polyp    Coronary artery disease    a. Nonobstructive by 2004 cath b. 05/2013 NSTEMI/Cath: >M 30, LAD 31m LCX 292mRCA 30p, EF 60%, ? Sev AS (mod by TEE).   GERD (gastroesophageal reflux disease)    Heart murmur    Hiatal hernia    Hyperlipidemia    Hypertension    Lymphedema    Mitral regurgitation    a. 03/2018 Echo: Mild to mod MR.  Obesity    Onychomycosis    Osteoarthritis    Recurrent cellulitis of lower extremity    Severe aortic stenosis    a. 2013 Echo: EF 55%, mod AS; b. TEE 05/2013: EF EF 60-65%, mod AS; c.10/2014 Echo: 55-60%, mod AS; d.  10/2016 Echo: EF 65-70%, Mod-Sev AS; e. 03/2018 [facility]: EF 60-65%, Sev AS, mild to mod AI. Mean grad (S) 55mHg, Valve Area (VTI): 0.92cm^2.     Micro Data:  11/8: SARS-CoV-2 PCR>> Positive 11/8: Blood culture x2>> 11/8: Urine>>  Antimicrobials:  Cefepime 11/8 x1 dose Meropenem 11/8>> Molnupiravir 11/8>>  Significant Hospital Events: Including procedures, antibiotic start and stop dates in addition to other pertinent events   11/8: Admitted by Hospitalist.  Remained hypotensive despite IVF resuscitation. Levophed started, PCCM consulted.  Right IJ CVC and right radial A-line placed    Objective   Blood pressure 105/64, pulse 97, temperature 98 F (36.7 C), temperature source Axillary, resp. rate (!) 24, height _0  (1.753 m), weight 106.5 kg, SpO2 98 %.        Intake/Output Summary (Last 24 hours) at 03/25/2022 0959 Last data filed at 03/25/2022 0800 Gross per 24 hour  Intake 4057.45 ml  Output 1500 ml  Net 2557.45 ml    Filed Weights   04/01/2022 0846 03/21/2022 1351  Weight: 106.6 kg 106.5 kg    Examination: General: Acute on chronically ill-appearing male, laying in bed, on 2 L nasal cannula, no acute distress HENT: Atraumatic, normocephalic, neck supple, positive JVD Lungs: Coarse breath sounds throughout, even, nonlabored, mild tachypnea Cardiovascular: Tachycardia, regular rhythm, S1-S2, positive murmur Abdomen: Soft, nontender, nondistended, no guarding room tenderness, bowel sounds positive x4 Extremities: 1+ pitting edema bilateral lower extremities, chronic venous insufficiency trophic changes and lymphedema, 1+ distal pulses Neuro: Somnolent, withdraws from pain, does not follow commands, pupils PERRLA GU: Foley catheter in place draining dark yellow urine  Resolved Hospital Problem list     Assessment & Plan:   Septic Shock-PRESENT ON ADMISSION - DUE TO KLEBSIELLA BACTEREMIA -Acute on Chronic HFpEF PMHx: HFpEF, CAD, aortic stenosis, mitral valve  regurgitation , HTN, HLD Echocardiogram 02/26/2022: LVEF 55/60%, indeterminate Diastolic parameters, RV function normal, Moderate MR, moderate AR, severe AS -Continuous cardiac monitoring -Maintain MAP >65 -Cautious IV fluids -Vasopressors as needed to maintain MAP goal -Continue Midodrine if able to tolerate PO -Start stress dose steroids -Trend lactic acid until normalized -Check HS Troponin  -Consider repeat Echocardiogram  -Diuresis as blood pressure and renal function permits ~currently unable to diurese due to shock  COVID-19 Infection -Monitor fever curve -Trend WBC's, Procalcitonin, and inflammatory markers -Follow cultures as above -Continue empiric Meropenem &  Molnupiravir pending cultures & sensitivities -Continue steroids -DC REMDESEVIR  Acute Kidney Injury CT Abdomen & Pelvis without hydronephrosis & no definitive stones -Monitor I&O's / urinary output -Follow BMP -Ensure adequate renal perfusion -Avoid nephrotoxic agents as able -Replace electrolytes as indicated -IV fluids -Trend CK -Low threshold for Nephrology consult  Elevated LFT's PMHx: Hyperbilirubinemia CT Abdomen & Pelvis without acute intra-abdominal findings -Trend LFTs  Acute Metabolic Encephalopathy due to severe sepsis and AKI PMHx: Depression -Treatment of sepsis and metabolic disorders as outlined above -Avoid sedating meds as able -Provide supportive care -N.p.o. for now, currently protecting airway, but high risk for intubation -CT head is currently pending -Check ABG, UDS & thyroid panel    Patient is critically ill with severe shock and multiorgan failure.  Prognosis is extremely guarded.  High risk for decompensation, need for intubation, cardiac  arrest and death.  Given advanced age and comorbidities recommend DNR status.  Best Practice (right click and "Reselect all SmartList Selections" daily)   Diet/type: NPO DVT prophylaxis: prophylactic heparin  GI prophylaxis:  N/A Lines: N/A Foley:  Yes, and it is still needed Code Status:  full code Last date of multidisciplinary goals of care discussion [N/A]  11/8: Updated patient's stepdaughter at bedside  Labs   CBC: Recent Labs  Lab 03/28/2022 0845 03/25/22 0355  WBC 13.5* 21.2*  NEUTROABS 12.6*  --   HGB 13.1 11.3*  HCT 40.1 34.0*  MCV 83.2 82.5  PLT 154 122*     Basic Metabolic Panel: Recent Labs  Lab 03/29/2022 0845 03/25/22 0355  NA 143 143  K 4.3 4.4  CL 108 111  CO2 24 21*  GLUCOSE 61* 123*  BUN 59* 59*  CREATININE 4.01* 2.97*  CALCIUM 8.3* 7.7*  MG  --  2.0  PHOS  --  4.2    GFR: Estimated Creatinine Clearance: 20.3 mL/min (A) (by C-G formula based on SCr of 2.97 mg/dL (H)). Recent Labs  Lab 03/20/2022 0844 03/25/2022 0845 04/15/2022 0845 04/15/2022 1126 04/04/2022 1614 03/19/2022 1840 03/25/22 0355  PROCALCITON 13.92  --   --   --   --   --   --   WBC  --  13.5*  --   --   --   --  21.2*  LATICACIDVEN  --  3.9*   < > 3.7* 2.4* 2.2* 1.9   < > = values in this interval not displayed.     Liver Function Tests: Recent Labs  Lab 04/11/2022 0845 03/25/22 0355  AST 65* 59*  ALT 29 28  ALKPHOS 171* 98  BILITOT 3.1* 2.6*  PROT 6.1* 5.4*  ALBUMIN 2.6* 2.3*    No results for input(s): "LIPASE", "AMYLASE" in the last 168 hours. No results for input(s): "AMMONIA" in the last 168 hours.  ABG    Component Value Date/Time   PHART 7.39 03/27/2022 1635   PCO2ART 39 03/26/2022 1635   PO2ART 73 (L) 03/20/2022 1635   HCO3 23.5 03/25/2022 0355   ACIDBASEDEF 1.1 03/25/2022 0355   O2SAT 74.7 03/25/2022 0355     Coagulation Profile: Recent Labs  Lab 04/07/2022 0845  INR 1.6*     Cardiac Enzymes: Recent Labs  Lab 03/21/2022 0844 03/25/22 0355  CKTOTAL 484* 322     HbA1C: Hemoglobin A1C  Date/Time Value Ref Range Status  05/21/2013 04:03 AM 5.9 4.2 - 6.3 % Final    Comment:    The American Diabetes Association recommends that a primary goal of therapy should be <7%  and that physicians should reevaluate the treatment regimen in patients with HbA1c values consistently >8%.    Hgb A1c MFr Bld  Date/Time Value Ref Range Status  02/24/2022 08:52 PM 4.7 (L) 4.8 - 5.6 % Final    Comment:    (NOTE) Pre diabetes:          5.7%-6.4%  Diabetes:              >6.4%  Glycemic control for   <7.0% adults with diabetes   11/28/2019 03:38 PM 5.2 4.6 - 6.5 % Final    Comment:    Glycemic Control Guidelines for People with Diabetes:Non Diabetic:  <6%Goal of Therapy: <7%Additional Action Suggested:  >8%     CBG: Recent Labs  Lab 03/27/2022 2016 03/17/2022 2323 03/25/22 0156 03/25/22 0408 03/25/22 0825  GLUCAP 88 79 113* 120* 112*  Review of Systems:   Unable to assess due to altered mental status  Past Medical History:  He,  has a past medical history of (HFpEF) heart failure with preserved ejection fraction ([place]), Aortic atherosclerosis ([place]) (05/2013), Arrhythmia, Asthma, CHF (congestive heart failure) ([place]), Colon polyp, Coronary artery disease, GERD (gastroesophageal reflux disease), Heart murmur, Hiatal hernia, Hyperlipidemia, Hypertension, Lymphedema, Mitral regurgitation, Obesity, Onychomycosis, Osteoarthritis, Recurrent cellulitis of lower extremity, and Severe aortic stenosis.   Surgical History:   Past Surgical History:  Procedure Laterality Date   CARDIAC CATHETERIZATION  11/2002   CARDIAC CATHETERIZATION  05/21/2013   showing 30% oLM, 30% D1, 20% mLCx, 30% pRCA; EF 60%, severe AS (mean gradient 28 mmHg, peak 26, area 0.9)   CHOLECYSTECTOMY  2001   TRANSESOPHAGEAL ECHOCARDIOGRAM  05/22/2013   Preserved EF, moderate AS (valve area by planimetry between 1.0-1.26 cm sq), moderate aortic arch and descending aortic atherosclerosis.    TRANSTHORACIC ECHOCARDIOGRAM  05/19/2013   EF 60-65%, impaired diastolic function, mild LA dilatation, mild MR/AI/TR, mod AS, high normal RVSP (30.4 mmHg)     Social History:   reports that he quit smoking about 41  years ago. His smoking use included cigarettes. He has a 30.00 pack-year smoking history. He has never used smokeless tobacco. He reports that he does not drink alcohol and does not use drugs.   Family History:  His family history includes Heart disease in his mother; Heart disease (age of onset: 40) in his father; Stroke in his brother.   Allergies Allergies  Allergen Reactions   Aspirin Rash, Other (See Comments) and Hives    Reaction: Trouble breathing      Home Medications  Prior to Admission medications   Medication Sig Start Date End Date Taking? Authorizing Provider  clopidogrel (PLAVIX) 75 MG tablet TAKE 1 TABLET(75 MG) BY MOUTH DAILY Patient taking differently: Take 75 mg by mouth daily. 06/03/21  Yes Loel Dubonnet, NP  folic acid (FOLVITE) 1 MG tablet Take 1 tablet (1 mg total) by mouth daily. 03/03/22 2022/04/28 Yes Pudota, Kathe Becton, MD  metoprolol tartrate (LOPRESSOR) 25 MG tablet Take 25 mg by mouth daily.   Yes [provider]  Multiple Vitamin (MULTIVITAMIN WITH MINERALS) TABS tablet Take 1 tablet by mouth daily. 03/03/22 04-28-22 Yes Pudota, Kathe Becton, MD  nystatin powder Apply 1 Application topically 2 (two) times daily.   Yes [provider]  oxycodone (OXY-IR) 5 MG capsule Take 5 mg by mouth every 4 (four) hours as needed for pain.   Yes [provider]  polyethylene glycol (MIRALAX / GLYCOLAX) 17 g packet Take 17 g by mouth daily.   Yes [provider]  QUEtiapine (SEROQUEL) 25 MG tablet Take 1 tablet (25 mg total) by mouth at bedtime. 03/02/22 04/01/22 Yes Pudota, Kathe Becton, MD  rosuvastatin (CRESTOR) 20 MG tablet Take 1 tablet (20 mg total) by mouth daily. 03/03/22 2022/04/28 Yes Pudota, Kathe Becton, MD  sennosides-docusate sodium (SENOKOT-S) 8.6-50 MG tablet Take 1 tablet by mouth daily.   Yes [provider]  thiamine (VITAMIN B-1) 100 MG tablet Take 1 tablet (100 mg total) by mouth daily. 03/03/22 04/28/2022 Yes Pudota,  Kathe Becton, MD  zinc sulfate 220 (50 Zn) MG capsule Take 220 mg by mouth daily.   Yes [provider]  metoprolol succinate (TOPROL-XL) 25 MG 24 hr tablet TAKE 1 TABLET(25 MG) BY MOUTH DAILY Patient taking differently: Take 25 mg by mouth daily. 04/14/21   Loel Dubonnet, NP  Critical care provider statement:   Total critical care time: 33 minutes   Performed by: Lanney Gins MD   Critical care time was exclusive of separately billable procedures and treating other patients.   Critical care was necessary to treat or prevent imminent or life-threatening deterioration.   Critical care was time spent personally by me on the following activities: development of treatment plan with patient and/or surrogate as well as nursing, discussions with consultants, evaluation of patient's response to treatment, examination of patient, obtaining history from patient or surrogate, ordering and performing treatments and interventions, ordering and review of laboratory studies, ordering and review of radiographic studies, pulse oximetry and re-evaluation of patient's condition.    Ottie Glazier, M.D.  Pulmonary & Critical Care Medicine

## 2022-03-25 NOTE — Consult Note (Cosign Needed Addendum)
Consultation Note Date: 03/25/2022   Patient Name: [name]  DOB: November 11, 1930  MRN: [id number]  Age / Sex: 88 y.o., male  PCP: Glori Luis, MD Referring Physician: Vida Rigger, MD  Reason for Consultation: Establishing goals of care  HPI/Patient Profile: [name] is a 88 y.o. male with a past medical history significant for indwelling foley catheter, hypertension, hyperlipidemia, asthma, stroke, depression, severe aortic stenosis, mitral valve regurgitation, CAD, HFpEF, hyperbilirubinemia, with who presents to [facility] ED due to altered mental status and generalized weakness.  Pt remains altered and is unable to contribute to history, therefore history is obtained from chart review.   Clinical Assessment and Goals of Care: Notes and labs reviewed.  Patient is resting in bed and has remained confused per staff.  Mittens in place.  No family at bedside.  Called to talk to stepdaughter.  She states that patient is widowed as her mother died several years ago.  She states that the patient does have 2 children, but his son is estranged, and he only speaks with his daughter about 1 time a year if that.  She states she has no contact information for his children, and though she and the patient have lived in the same house, she has nothing to do with those relationships.  She states that he and she had been living together.  She states that at baseline he uses a walker but is able to do ADLs and able to go to fix himself something to eat.  She states he no longer drives, but that the this is on my because of lymphedema in his legs.   We discussed the past few months from her perspective.  She states a few weeks ago she had found him lying in the floor where he had slid out of the chair.  She states he was brought to the hospital and then from there proceeded to rehab.  From rehab, he entered this  admission.   We discussed his diagnoses and the current and previous admission from a medical standpoint.  We discussed his prognosis, and started conversations on goals of care and acceptable quality of life.  She has been kept updated and understands how sick he is.  Created space and opportunity for patient  to explore thoughts and feelings regarding current medical information.    Stepdaughter states patient would never want to live in a nursing facility as he really did not want to go to rehab following last admission.  When discussing quality of life in the future, she states that he would want to do anything that he can to keep him alive.    She is tearful during conversation, and states that she was not able to come and visit him today because she is feeling sick.  She did visit him while in rehab.  She understands that patient is COVID-positive.  She says that she has tested herself and she is not positive for COVID at this time.  Discussed that if she feels well, perhaps we  could talk tomorrow in person if she is able to come and visit him.         SUMMARY OF RECOMMENDATIONS   PMT following, continue full code full scope at this time.   Prognosis:  Very poor       Primary Diagnoses: Present on Admission:  Severe sepsis with septic shock (CODE) (HCC)  Complicated UTI (urinary tract infection)  COVID-19 virus infection  Acute kidney injury (HCC)  Coronary artery disease, non-occlusive  Hypertension  Hyperlipidemia  Chronic diastolic heart failure (HCC)  Hyperbilirubinemia  Hypoglycemia  Acute metabolic encephalopathy  Depression  Severe sepsis with septic shock (HCC)   I have reviewed the medical record, interviewed the patient and family, and examined the patient. The following aspects are pertinent.  Past Medical History:  Diagnosis Date   (HFpEF) heart failure with preserved ejection fraction (HCC)    a. 03/2018 Echo: EF 60-65%, Gr1 DD.   Aortic  atherosclerosis (HCC) 05/2013   Per TEE   Arrhythmia    Asthma    CHF (congestive heart failure) (HCC)    Colon polyp    Coronary artery disease    a. Nonobstructive by 2004 cath b. 05/2013 NSTEMI/Cath: >M 30, LAD 61m, LCX 31m, RCA 30p, EF 60%, ? Sev AS (mod by TEE).   GERD (gastroesophageal reflux disease)    Heart murmur    Hiatal hernia    Hyperlipidemia    Hypertension    Lymphedema    Mitral regurgitation    a. 03/2018 Echo: Mild to mod MR.   Obesity    Onychomycosis    Osteoarthritis    Recurrent cellulitis of lower extremity    Severe aortic stenosis    a. 2013 Echo: EF 55%, mod AS; b. TEE 05/2013: EF EF 60-65%, mod AS; c.10/2014 Echo: 55-60%, mod AS; d. 10/2016 Echo: EF 65-70%, Mod-Sev AS; e. 03/2018 [facility]: EF 60-65%, Sev AS, mild to mod AI. Mean grad (S) , Valve Area (VTI): 0.92cm^2.   Social History   Socioeconomic History   Marital status: Widowed    Spouse name: Not on file   Number of children: Not on file   Years of education: Not on file   Highest education level: Not on file  Occupational History   Occupation: Retired Electronics engineer - Barista     Comment: Acupuncturist  Tobacco Use   Smoking status: Former    Packs/Bernat: 1.50    Years: 20.00    Total pack years: 30.00    Types: Cigarettes    Quit date: 02/22/1981    Years since quitting: 41.1   Smokeless tobacco: Never  Vaping Use   Vaping Use: Never used  Substance and Sexual Activity   Alcohol use: No   Drug use: No   Sexual activity: Not Currently  Other Topics Concern   Not on file  Social History Narrative   [name] lives in [place] with his daughter. Wife passed away a few months ago. They have a son and daughter. He is retired from [employer]. He enjoys fishing.   Social Determinants of Health   Financial Resource Strain: Low Risk  (04/05/2017)   Overall Financial Resource Strain (CARDIA)    Difficulty of Paying Living Expenses: Not hard at all  Food Insecurity: No Food  Insecurity (04/05/2017)   Hunger Vital Sign    Worried About Running Out of Food in the Last Year: Never true    Ran Out of Food in the Last Year: Never  true  Transportation Needs: No Transportation Needs (04/05/2017)   PRAPARE - Administrator, Civil Service (Medical): No    Lack of Transportation (Non-Medical): No  Physical Activity: Inactive (04/05/2017)   Exercise Vital Sign    Days of Exercise per Week: 0 days    Minutes of Exercise per Session: 0 min  Stress: No Stress Concern Present (04/05/2017)   [employer] of Occupational Health - Occupational Stress Questionnaire    Feeling of Stress : Only a little  Social Connections: Unknown (04/05/2017)   Social Connection and Isolation Panel [NHANES]    Frequency of Communication with Friends and Family: Patient refused    Frequency of Social Gatherings with Friends and Family: Patient refused    Attends Religious Services: Patient refused    Database administrator or Organizations: Patient refused    Attends Engineer, structural: Patient refused    Marital Status: Patient refused   Family History  Problem Relation Age of Onset   Heart disease Mother    Heart disease Father 59   Stroke Brother    Scheduled Meds:  Chlorhexidine Gluconate Cloth  6 each Topical Daily   heparin  5,000 Units Subcutaneous Q8H   hydrocortisone sod succinate (SOLU-CORTEF) inj  50 mg Intravenous Q6H   Continuous Infusions:  cefTRIAXone (ROCEPHIN)  IV Stopped (03/25/22 0917)   norepinephrine (LEVOPHED) Adult infusion 3 mcg/min (03/25/22 1200)   vasopressin 0.03 Units/min (03/25/22 1200)   PRN Meds:.acetaminophen, albuterol, dextromethorphan-guaiFENesin, dextrose, diphenhydrAMINE, haloperidol lactate, mouth rinse Medications Prior to Admission:  Prior to Admission medications   Medication Sig Start Date End Date Taking? Authorizing Provider  clopidogrel (PLAVIX) 75 MG tablet TAKE 1 TABLET(75 MG) BY MOUTH DAILY Patient  taking differently: Take 75 mg by mouth daily. 06/03/21  Yes Alver Sorrow, NP  folic acid (FOLVITE) 1 MG tablet Take 1 tablet (1 mg total) by mouth daily. 03/03/22 03/20/2022 Yes Pudota, Elsie Ra, MD  metoprolol tartrate (LOPRESSOR) 25 MG tablet Take 25 mg by mouth daily.   Yes [provider]  Multiple Vitamin (MULTIVITAMIN WITH MINERALS) TABS tablet Take 1 tablet by mouth daily. 03/03/22 03/29/2022 Yes Pudota, Elsie Ra, MD  nystatin powder Apply 1 Application topically 2 (two) times daily.   Yes [provider]  oxycodone (OXY-IR) 5 MG capsule Take 5 mg by mouth every 4 (four) hours as needed for pain.   Yes [provider]  polyethylene glycol (MIRALAX / GLYCOLAX) 17 g packet Take 17 g by mouth daily.   Yes [provider]  QUEtiapine (SEROQUEL) 25 MG tablet Take 1 tablet (25 mg total) by mouth at bedtime. 03/02/22 04/01/22 Yes Pudota, Elsie Ra, MD  rosuvastatin (CRESTOR) 20 MG tablet Take 1 tablet (20 mg total) by mouth daily. 03/03/22 03/20/2022 Yes Pudota, Elsie Ra, MD  sennosides-docusate sodium (SENOKOT-S) 8.6-50 MG tablet Take 1 tablet by mouth daily.   Yes [provider]  thiamine (VITAMIN B-1) 100 MG tablet Take 1 tablet (100 mg total) by mouth daily. 03/03/22 04/05/2022 Yes Pudota, Elsie Ra, MD  zinc sulfate 220 (50 Zn) MG capsule Take 220 mg by mouth daily.   Yes [provider]  metoprolol succinate (TOPROL-XL) 25 MG 24 hr tablet TAKE 1 TABLET(25 MG) BY MOUTH DAILY Patient taking differently: Take 25 mg by mouth daily. 04/14/21   Alver Sorrow, NP   Allergies  Allergen Reactions   Aspirin Rash, Other (See Comments) and Hives    Reaction: Trouble breathing  Review of Systems  Unable to perform ROS   Physical Exam Pulmonary:     Effort: Pulmonary effort is normal.  Neurological:     Mental Status: He is alert.     Vital Signs: BP (!) 128/58 (BP Location: Left Arm)   Pulse 93   Temp 98.6 F (37 C)  (Axillary)   Resp (!) 24   Ht 5\' 9"  (1.753 m)   Wt 106.5 kg   SpO2 100%   BMI 34.67 kg/m  Pain Scale: CPOT POSS *See Group Information*: 2-Acceptable,Slightly drowsy, easily aroused Pain Score: Asleep   SpO2: SpO2: 100 % O2 Device:SpO2: 100 % O2 Flow Rate: .O2 Flow Rate (L/min): 3 L/min  IO: Intake/output summary:  Intake/Output Summary (Last 24 hours) at 03/25/2022 1628 Last data filed at 03/25/2022 1200 Gross per 24 hour  Intake 754.68 ml  Output 825 ml  Net -70.32 ml    LBM: Last BM Date :  (PTA) Baseline Weight: Weight: 106.6 kg Most recent weight: Weight: 106.5 kg       Signed by: 13/01/2022, NP   Please contact Palliative Medicine Team phone at [phone] for questions and concerns.  For individual provider: See [phone]

## 2022-03-26 DIAGNOSIS — Z7189 Other specified counseling: Secondary | ICD-10-CM | POA: Diagnosis not present

## 2022-03-26 LAB — GLUCOSE, CAPILLARY
Glucose-Capillary: 134 mg/dL — ABNORMAL HIGH (ref 70–99)
Glucose-Capillary: 136 mg/dL — ABNORMAL HIGH (ref 70–99)
Glucose-Capillary: 148 mg/dL — ABNORMAL HIGH (ref 70–99)
Glucose-Capillary: 150 mg/dL — ABNORMAL HIGH (ref 70–99)
Glucose-Capillary: 155 mg/dL — ABNORMAL HIGH (ref 70–99)
Glucose-Capillary: 159 mg/dL — ABNORMAL HIGH (ref 70–99)

## 2022-03-26 LAB — BASIC METABOLIC PANEL
Anion gap: 10 (ref 5–15)
BUN: 76 mg/dL — ABNORMAL HIGH (ref 8–23)
CO2: 22 mmol/L (ref 22–32)
Calcium: 7.4 mg/dL — ABNORMAL LOW (ref 8.9–10.3)
Chloride: 111 mmol/L (ref 98–111)
Creatinine, Ser: 2.5 mg/dL — ABNORMAL HIGH (ref 0.61–1.24)
GFR, Estimated: 24 mL/min — ABNORMAL LOW (ref >60)
Glucose, Bld: 175 mg/dL — ABNORMAL HIGH (ref 70–99)
Potassium: 4.6 mmol/L (ref 3.5–5.1)
Sodium: 143 mmol/L (ref 135–145)

## 2022-03-26 LAB — PROCALCITONIN: Procalcitonin: 15.13 ng/mL

## 2022-03-26 LAB — HEPATIC FUNCTION PANEL
ALT: 34 U/L (ref 0–44)
AST: 77 U/L — ABNORMAL HIGH (ref 15–41)
Albumin: 2.2 g/dL — ABNORMAL LOW (ref 3.5–5.0)
Alkaline Phosphatase: 99 U/L (ref 38–126)
Bilirubin, Direct: 0.8 mg/dL — ABNORMAL HIGH (ref 0.0–0.2)
Indirect Bilirubin: 0.9 mg/dL (ref 0.3–0.9)
Total Bilirubin: 1.7 mg/dL — ABNORMAL HIGH (ref 0.3–1.2)
Total Protein: 5.5 g/dL — ABNORMAL LOW (ref 6.5–8.1)

## 2022-03-26 LAB — CBC
HCT: 34.2 % — ABNORMAL LOW (ref 39.0–52.0)
Hemoglobin: 11.3 g/dL — ABNORMAL LOW (ref 13.0–17.0)
MCH: 27.2 pg (ref 26.0–34.0)
MCHC: 33 g/dL (ref 30.0–36.0)
MCV: 82.4 fL (ref 80.0–100.0)
Platelets: 107 10*3/uL — ABNORMAL LOW (ref 150–400)
RBC: 4.15 MIL/uL — ABNORMAL LOW (ref 4.22–5.81)
RDW: 16.9 % — ABNORMAL HIGH (ref 11.5–15.5)
WBC: 14.5 10*3/uL — ABNORMAL HIGH (ref 4.0–10.5)
nRBC: 0 % (ref 0.0–0.2)

## 2022-03-26 LAB — FERRITIN: Ferritin: 2722 ng/mL — ABNORMAL HIGH (ref 24–336)

## 2022-03-26 LAB — URINE CULTURE: Culture: >=100000 — AB

## 2022-03-26 LAB — MAGNESIUM: Magnesium: 2.3 mg/dL (ref 1.7–2.4)

## 2022-03-26 LAB — PHOSPHORUS: Phosphorus: 5.7 mg/dL — ABNORMAL HIGH (ref 2.5–4.6)

## 2022-03-26 LAB — THYROID PANEL WITH TSH
Free Thyroxine Index: 2.1 (ref 1.2–4.9)
T3 Uptake Ratio: 39 % (ref 24–39)
T4, Total: 5.3 ug/dL (ref 4.5–12.0)
TSH: 1.09 u[IU]/mL (ref 0.450–4.500)

## 2022-03-26 LAB — D-DIMER, QUANTITATIVE: D-Dimer, Quant: 3.76 ug/mL-FEU — ABNORMAL HIGH (ref 0.00–0.50)

## 2022-03-26 LAB — C-REACTIVE PROTEIN: CRP: 24.9 mg/dL — ABNORMAL HIGH (ref <1.0)

## 2022-03-26 MED ORDER — DEXTROSE IN LACTATED RINGERS 5 % IV SOLN: INTRAVENOUS | Status: AC

## 2022-03-26 MED ORDER — ALBUMIN HUMAN 25 % IV SOLN
25.0000 g | Freq: Once | INTRAVENOUS | Status: AC
  Administered 2022-03-26: 25 g via INTRAVENOUS
  Filled 2022-03-26: qty 100

## 2022-03-26 NOTE — Progress Notes (Signed)
OT Cancellation Note  Patient Details Name: [name] MRN: [id number] DOB: [birth year]   Cancelled Treatment:    Reason Eval/Treat Not Completed: Other (comment) (pt on precedex, still lightly sedated per RN. OT will hold on this date, will reattempt as able.Oleta Mouse, OTD OTR/L  03/26/22, 3:33 PM

## 2022-03-26 NOTE — Progress Notes (Signed)
PT Cancellation Note  Patient Details Name: [name] MRN: [id number] DOB: Apr 10, 1931   Cancelled Treatment:    Reason Eval/Treat Not Completed: Fatigue/lethargy limiting ability to participate (Chart reviewed for planned treatment session. Per OT, primary RN, patient currently requiring precedex infusion due to restlessness, agitation.  Unable to actively participate with session at this time.  Will continue efforts as appropriate.)  Ayda Tancredi H. Manson Passey, PT, DPT, NCS 03/26/22, 1:19 PM [phone]

## 2022-03-26 NOTE — Progress Notes (Signed)
NAMEAndrue [name], MRN:  [id number], DOB:  20-Jan-1931, LOS: 2 ADMISSION DATE:  Apr 12, 2022, CONSULTATION DATE:  04-12-22 REFERRING MD:  Dr. Clyde Lundborg, CHIEF COMPLAINT:  Altered Mental Status, generalized weakness   Brief Pt Description / Synopsis:  88 y.o. Male admitted with Acute Metabolic Encephalopathy and severe sepsis with Septic shock in setting of UTI & COVID-19 infection.  Also found to have AKI.  History of Present Illness:  [name] is a 88 y.o. male with a past medical history significant for indwelling foley catheter, hypertension, hyperlipidemia, asthma, stroke, depression, severe aortic stenosis, mitral valve regurgitation, CAD, HFpEF, hyperbilirubinemia, with who presents to [facility] ED due to altered mental status and generalized weakness.  Pt remains altered and is unable to contribute to history, therefore history is obtained from chart review.  Per ED and nursing notes, he was sent in from his nursing facility due to altered mental status.  Patient's chronic indwelling Foley catheter was removed, and was unable to be replaced at the facility.  He was noted to have fever and chills, and to be incontinent with malodorous urine.   Please see "significant hospital events" section below for full detailed hospital course.  03/25/22- patient is more alert this am but is not appropriate with encephalopathy reaching and pulling on catheters.  He will start PT/OT today.  He is bacteremic with Klebsiella pneumoniae  03/26/22- patient on precedex much less aggitated not pulling on a line or central venous catheter. He's off levophed but still on vaso. He had PT/OT yesterday.  He has palliative care following.  He's on rocephin for bacteramia.   Pertinent  Medical History   Past Medical History:  Diagnosis Date   (HFpEF) heart failure with preserved ejection fraction (HCC)    a. 03/2018 Echo: EF 60-65%, Gr1 DD.   Aortic atherosclerosis (HCC) 05/2013   Per TEE   Arrhythmia    Asthma    CHF  (congestive heart failure) (HCC)    Colon polyp    Coronary artery disease    a. Nonobstructive by 2004 cath b. 05/2013 NSTEMI/Cath: >M 30, LAD 42m, LCX 20m, RCA 30p, EF 60%, ? Sev AS (mod by TEE).   GERD (gastroesophageal reflux disease)    Heart murmur    Hiatal hernia    Hyperlipidemia    Hypertension    Lymphedema    Mitral regurgitation    a. 03/2018 Echo: Mild to mod MR.   Obesity    Onychomycosis    Osteoarthritis    Recurrent cellulitis of lower extremity    Severe aortic stenosis    a. 2013 Echo: EF 55%, mod AS; b. TEE 05/2013: EF EF 60-65%, mod AS; c.10/2014 Echo: 55-60%, mod AS; d. 10/2016 Echo: EF 65-70%, Mod-Sev AS; e. 03/2018 Ehco: EF 60-65%, Sev AS, mild to mod AI. Mean grad (S) , Valve Area (VTI): 0.92cm^2.     Micro Data:  11/8: SARS-CoV-2 PCR>> Positive 11/8: Blood culture x2>> 11/8: Urine>>  Antimicrobials:  Cefepime 11/8 x1 dose Meropenem 11/8>> Molnupiravir 11/8>>  Significant Hospital Events: Including procedures, antibiotic start and stop dates in addition to other pertinent events   11/8: Admitted by Hospitalist.  Remained hypotensive despite IVF resuscitation. Levophed started, PCCM consulted.  Right IJ CVC and right radial A-line placed    Objective   Blood pressure (!) 107/28, pulse (!) 57, temperature 97.6 F (36.4 C), temperature source Oral, resp. rate 13, height 5\' 9"  (1.753 m), weight 107.7 kg, SpO2 100 %.  Intake/Output Summary (Last 24 hours) at 03/26/2022 0959 Last data filed at 03/26/2022 0800 Gross per 24 hour  Intake 571.39 ml  Output 460 ml  Net 111.39 ml    Filed Weights   2022-04-22 0846 04-22-22 1351 03/26/22 0420  Weight: 106.6 kg 106.5 kg 107.7 kg    Examination: General: Acute on chronically ill-appearing male, laying in bed, on 2 L nasal cannula, no acute distress HENT: Atraumatic, normocephalic, neck supple, positive JVD Lungs: Coarse breath sounds throughout, even, nonlabored, mild  tachypnea Cardiovascular: Tachycardia, regular rhythm, S1-S2, positive murmur Abdomen: Soft, nontender, nondistended, no guarding room tenderness, bowel sounds positive x4 Extremities: 1+ pitting edema bilateral lower extremities, chronic venous insufficiency trophic changes and lymphedema, 1+ distal pulses Neuro: Somnolent, withdraws from pain, does not follow commands, pupils PERRLA GU: Foley catheter in place draining dark yellow urine  Resolved Hospital Problem list     Assessment & Plan:   Septic Shock-PRESENT ON ADMISSION - DUE TO KLEBSIELLA BACTEREMIA -Acute on Chronic HFpEF PMHx: HFpEF, CAD, aortic stenosis, mitral valve regurgitation , HTN, HLD Echocardiogram 02/26/2022: LVEF 55/60%, indeterminate Diastolic parameters, RV function normal, Moderate MR, moderate AR, severe AS -Continuous cardiac monitoring -Maintain MAP >65 -Cautious IV fluids -Vasopressors as needed to maintain MAP goal -Continue Midodrine if able to tolerate PO -Start stress dose steroids -Trend lactic acid until normalized -Check HS Troponin  -Consider repeat Echocardiogram  -Diuresis as blood pressure and renal function permits ~currently unable to diurese due to shock  COVID-19 Infection -Monitor fever curve -Trend WBC's, Procalcitonin, and inflammatory markers -Follow cultures as above -Continue empiric Meropenem &  Molnupiravir pending cultures & sensitivities -Continue steroids -DC REMDESEVIR  Acute Kidney Injury CT Abdomen & Pelvis without hydronephrosis & no definitive stones -Monitor I&O's / urinary output -Follow BMP -Ensure adequate renal perfusion -Avoid nephrotoxic agents as able -Replace electrolytes as indicated -IV fluids -Trend CK -Low threshold for Nephrology consult  Elevated LFT's PMHx: Hyperbilirubinemia CT Abdomen & Pelvis without acute intra-abdominal findings -Trend LFTs  Acute Metabolic Encephalopathy due to severe sepsis and AKI PMHx: Depression -Treatment of  sepsis and metabolic disorders as outlined above -Avoid sedating meds as able -Provide supportive care -N.p.o. for now, currently protecting airway, but high risk for intubation -CT head is currently pending -Check ABG, UDS & thyroid panel    Patient is critically ill with severe shock and multiorgan failure.  Prognosis is extremely guarded.  High risk for decompensation, need for intubation, cardiac arrest and death.  Given advanced age and comorbidities recommend DNR status.  Best Practice (right click and "Reselect all SmartList Selections" daily)   Diet/type: NPO DVT prophylaxis: prophylactic heparin  GI prophylaxis: N/A Lines: N/A Foley:  Yes, and it is still needed Code Status:  full code Last date of multidisciplinary goals of care discussion [N/A]  11/8: Updated patient's stepdaughter at bedside  Labs   CBC: Recent Labs  Lab 04/22/2022 0845 03/25/22 0355 03/26/22 0359  WBC 13.5* 21.2* 14.5*  NEUTROABS 12.6*  --   --   HGB 13.1 11.3* 11.3*  HCT 40.1 34.0* 34.2*  MCV 83.2 82.5 82.4  PLT 154 122* 107*     Basic Metabolic Panel: Recent Labs  Lab 2022-04-22 0845 03/25/22 0355 03/26/22 0359  NA 143 143 143  K 4.3 4.4 4.6  CL 108 111 111  CO2 24 21* 22  GLUCOSE 61* 123* 175*  BUN 59* 59* 76*  CREATININE 4.01* 2.97* 2.50*  CALCIUM 8.3* 7.7* 7.4*  MG  --  2.0 2.3  PHOS  --  4.2 5.7*    GFR: Estimated Creatinine Clearance: 24.2 mL/min (A) (by C-G formula based on SCr of 2.5 mg/dL (H)). Recent Labs  Lab 03/19/2022 0844 03/21/2022 0845 03/23/2022 0845 04/03/2022 1126 04/12/2022 1614 04/13/2022 1840 03/25/22 0355 03/26/22 0359  PROCALCITON 13.92  --   --   --   --   --  18.50 15.13  WBC  --  13.5*  --   --   --   --  21.2* 14.5*  LATICACIDVEN  --  3.9*   < > 3.7* 2.4* 2.2* 1.9  --    < > = values in this interval not displayed.     Liver Function Tests: Recent Labs  Lab 04/11/2022 0845 03/25/22 0355 03/26/22 0359  AST 65* 59* 77*  ALT 29 28 34  ALKPHOS  171* 98 99  BILITOT 3.1* 2.6* 1.7*  PROT 6.1* 5.4* 5.5*  ALBUMIN 2.6* 2.3* 2.2*    No results for input(s): "LIPASE", "AMYLASE" in the last 168 hours. No results for input(s): "AMMONIA" in the last 168 hours.  ABG    Component Value Date/Time   PHART 7.39 03/27/2022 1635   PCO2ART 39 04/09/2022 1635   PO2ART 73 (L) 04/13/2022 1635   HCO3 23.5 03/25/2022 0355   ACIDBASEDEF 1.1 03/25/2022 0355   O2SAT 74.7 03/25/2022 0355     Coagulation Profile: Recent Labs  Lab 04/15/2022 0845  INR 1.6*     Cardiac Enzymes: Recent Labs  Lab 03/30/2022 0844 03/25/22 0355  CKTOTAL 484* 322     HbA1C: Hemoglobin A1C  Date/Time Value Ref Range Status  05/21/2013 04:03 AM 5.9 4.2 - 6.3 % Final    Comment:    The American Diabetes Association recommends that a primary goal of therapy should be <7% and that physicians should reevaluate the treatment regimen in patients with HbA1c values consistently >8%.    Hgb A1c MFr Bld  Date/Time Value Ref Range Status  02/24/2022 08:52 PM 4.7 (L) 4.8 - 5.6 % Final    Comment:    (NOTE) Pre diabetes:          5.7%-6.4%  Diabetes:              >6.4%  Glycemic control for   <7.0% adults with diabetes   11/28/2019 03:38 PM 5.2 4.6 - 6.5 % Final    Comment:    Glycemic Control Guidelines for People with Diabetes:Non Diabetic:  <6%Goal of Therapy: <7%Additional Action Suggested:  >8%     CBG: Recent Labs  Lab 03/25/22 1627 03/25/22 1959 03/25/22 2320 03/26/22 0416 03/26/22 0814  GLUCAP 135* 139* 160* 136* 159*     Review of Systems:   Unable to assess due to altered mental status  Past Medical History:  He,  has a past medical history of (HFpEF) heart failure with preserved ejection fraction (HCC), Aortic atherosclerosis (HCC) (05/2013), Arrhythmia, Asthma, CHF (congestive heart failure) (HCC), Colon polyp, Coronary artery disease, GERD (gastroesophageal reflux disease), Heart murmur, Hiatal hernia, Hyperlipidemia, Hypertension,  Lymphedema, Mitral regurgitation, Obesity, Onychomycosis, Osteoarthritis, Recurrent cellulitis of lower extremity, and Severe aortic stenosis.   Surgical History:   Past Surgical History:  Procedure Laterality Date   CARDIAC CATHETERIZATION  11/2002   CARDIAC CATHETERIZATION  05/21/2013   showing 30% oLM, 30% D1, 20% mLCx, 30% pRCA; EF 60%, severe AS (mean gradient 28 mmHg, peak 26, area 0.9)   CHOLECYSTECTOMY  2001   TRANSESOPHAGEAL ECHOCARDIOGRAM  05/22/2013   Preserved EF, moderate AS (valve  area by planimetry between 1.0-1.26 cm sq), moderate aortic arch and descending aortic atherosclerosis.    TRANSTHORACIC ECHOCARDIOGRAM  05/19/2013   EF 60-65%, impaired diastolic function, mild LA dilatation, mild MR/AI/TR, mod AS, high normal RVSP (30.4 mmHg)     Social History:   reports that he quit smoking about 41 years ago. His smoking use included cigarettes. He has a 30.00 pack-year smoking history. He has never used smokeless tobacco. He reports that he does not drink alcohol and does not use drugs.   Family History:  His family history includes Heart disease in his mother; Heart disease (age of onset: 51) in his father; Stroke in his brother.   Allergies Allergies  Allergen Reactions   Aspirin Rash, Other (See Comments) and Hives    Reaction: Trouble breathing      Home Medications  Prior to Admission medications   Medication Sig Start Date End Date Taking? Authorizing Provider  clopidogrel (PLAVIX) 75 MG tablet TAKE 1 TABLET(75 MG) BY MOUTH DAILY Patient taking differently: Take 75 mg by mouth daily. 06/03/21  Yes Alver Sorrow, NP  folic acid (FOLVITE) 1 MG tablet Take 1 tablet (1 mg total) by mouth daily. 03/03/22 2022-04-08 Yes Pudota, Elsie Ra, MD  metoprolol tartrate (LOPRESSOR) 25 MG tablet Take 25 mg by mouth daily.   Yes [provider]  Multiple Vitamin (MULTIVITAMIN WITH MINERALS) TABS tablet Take 1 tablet by mouth daily. 03/03/22 Apr 08, 2022 Yes Pudota, Elsie Ra,  MD  nystatin powder Apply 1 Application topically 2 (two) times daily.   Yes [provider]  oxycodone (OXY-IR) 5 MG capsule Take 5 mg by mouth every 4 (four) hours as needed for pain.   Yes [provider]  polyethylene glycol (MIRALAX / GLYCOLAX) 17 g packet Take 17 g by mouth daily.   Yes [provider]  QUEtiapine (SEROQUEL) 25 MG tablet Take 1 tablet (25 mg total) by mouth at bedtime. 03/02/22 04/01/22 Yes Pudota, Elsie Ra, MD  rosuvastatin (CRESTOR) 20 MG tablet Take 1 tablet (20 mg total) by mouth daily. 03/03/22 April 08, 2022 Yes Pudota, Elsie Ra, MD  sennosides-docusate sodium (SENOKOT-S) 8.6-50 MG tablet Take 1 tablet by mouth daily.   Yes [provider]  thiamine (VITAMIN B-1) 100 MG tablet Take 1 tablet (100 mg total) by mouth daily. 03/03/22 Apr 08, 2022 Yes Pudota, Elsie Ra, MD  zinc sulfate 220 (50 Zn) MG capsule Take 220 mg by mouth daily.   Yes [provider]  metoprolol succinate (TOPROL-XL) 25 MG 24 hr tablet TAKE 1 TABLET(25 MG) BY MOUTH DAILY Patient taking differently: Take 25 mg by mouth daily. 04/14/21   Alver Sorrow, NP     Critical care provider statement:   Total critical care time: 33 minutes   Performed by: Karna Christmas MD   Critical care time was exclusive of separately billable procedures and treating other patients.   Critical care was necessary to treat or prevent imminent or life-threatening deterioration.   Critical care was time spent personally by me on the following activities: development of treatment plan with patient and/or surrogate as well as nursing, discussions with consultants, evaluation of patient's response to treatment, examination of patient, obtaining history from patient or surrogate, ordering and performing treatments and interventions, ordering and review of laboratory studies, ordering and review of radiographic studies, pulse oximetry and re-evaluation of patient's condition.    Vida Rigger, M.D.  Pulmonary & Critical Care Medicine

## 2022-03-26 NOTE — Progress Notes (Signed)
Daily Progress Note   Patient Name: [name]       Date: 03/26/2022 DOB: [birth year]  Age: 88 y.o. MRN#: [id number] Attending Physician: Vida Rigger, MD Primary Care Physician: Glori Luis, MD Admit Date: 04-08-22  Reason for Consultation/Follow-up: Establishing goals of care  Subjective: Notes and labs reviewed.  Patient is resting in bed quietly now on Precedex drip.  No family at bedside.  Called to speak to stepdaughter.  Stepdaughter states that she come briefly to visit with him this morning. Inquired if she has any questions or if there is any information needed.  Broached his poor prognosis.  She states that all of her questions have been answered satisfactorily and she has nothing that she would like to speak about.  She states that she is thankful for all the care that the hospital is providing and is thankful that he is "moving in the right direction".    Would recommend CCM continue to work with patient stepdaughter over the weekend and providing updates.  Length of Stay: 2  Current Medications: Scheduled Meds:   Chlorhexidine Gluconate Cloth  6 each Topical Daily   heparin  5,000 Units Subcutaneous Q8H    Continuous Infusions:  cefTRIAXone (ROCEPHIN)  IV Stopped (03/26/22 0907)   dexmedetomidine (PRECEDEX) IV infusion 0.3 mcg/kg/hr (03/26/22 1400)   dextrose 5% lactated ringers 30 mL/hr at 03/26/22 1400   vasopressin 0.03 Units/min (03/26/22 1400)    PRN Meds: acetaminophen, albuterol, dextromethorphan-guaiFENesin, dextrose, mouth rinse  Physical Exam Constitutional:      Comments: Eyes closed.  Pulmonary:     Effort: Pulmonary effort is normal.             Vital Signs: BP (!) 122/34   Pulse (!) 50   Temp 98 F (36.7 C) (Oral)   Resp 15   Ht  5\' 9"  (1.753 m)   Wt 107.7 kg   SpO2 100%   BMI 35.06 kg/m  SpO2: SpO2: 100 % O2 Device: O2 Device: Room Air O2 Flow Rate: O2 Flow Rate (L/min): 3 L/min  Intake/output summary:  Intake/Output Summary (Last 24 hours) at 03/26/2022 1516 Last data filed at 03/26/2022 1400 Gross per 24 hour  Intake 777.97 ml  Output 535 ml  Net 242.97 ml   LBM: Last BM Date : 03/25/22 Baseline Weight: Weight:  106.6 kg Most recent weight: Weight: 107.7 kg   Patient Active Problem List   Diagnosis Date Noted   Severe sepsis with septic shock (CODE) (HCC) 04-17-2022   Complicated UTI (urinary tract infection) 2022-04-17   COVID-19 virus infection April 17, 2022   Hypoglycemia 2022-04-17   Acute metabolic encephalopathy 04-17-22   Depression 2022/04/17   Severe sepsis with septic shock (HCC) 2022-04-17   Aneurysm (HCC) 02/27/2022   Pressure injury of skin 02/26/2022   Acute lacunar stroke (HCC) 02/26/2022   Sepsis (HCC) 02/25/2022   Acute kidney injury (HCC) 02/24/2022   Rhabdomyolysis 02/24/2022   Generalized weakness 02/24/2022   Somnolence 02/24/2022   Sinus arrhythmia 02/24/2022   (HFpEF) heart failure with preserved ejection fraction (HCC) 02/24/2022   Fall 02/24/2022   Right leg swelling 12/03/2019   Hyperbilirubinemia 12/03/2019   Cellulitis 03/17/2018   Acute on chronic heart failure (HCC) 10/19/2017   Poor social situation 09/06/2017   Chronic diastolic heart failure (HCC)    Venous ulcer (HCC) 07/08/2017   Osteoarthritis 05/28/2017   Varicose veins of both lower extremities with pain 04/10/2017   Iron deficiency anemia 11/25/2016   Chronic venous insufficiency 10/04/2016   Lymphedema 06/08/2016   Morbid obesity (HCC) 03/26/2015   Coronary artery disease, non-occlusive 06/05/2013   Hypertension 06/05/2013   Hyperlipidemia 02/23/2012   Diabetes mellitus (HCC) 02/23/2012   Aortic valve stenosis, severe 02/23/2012    Palliative Care Assessment & Plan      Recommendations/Plan: Broached his poor prognosis and stepdaughter tells me that she has no further questions or anything that she would like to speak about, and that she is thankful for all the hospital is doing for her stepfather at this time. Would recommend CCM to continue to have ongoing conversations over the weekend.  Code Status:    Code Status Orders  (From admission, onward)           Start     Ordered   04-17-22 1157  Full code  Continuous        2022-04-17 1157           Code Status History     Date Active Date Inactive Code Status Order ID Comments User Context   03/01/2022 1129 03/03/2022 0227 DNR [id number]  Canary Brim, NP Inpatient   02/24/2022 1954 03/01/2022 1129 Full Code [id number]  Verdene Lennert, MD ED   03/17/2018 1354 03/20/2018 2228 Full Code [id number]  Alford Highland, MD ED   09/27/2017 0734 09/29/2017 2034 Full Code [id number]  Marcelo Baldy, MD ED   08/07/2017 0428 08/10/2017 1359 Full Code [id number]  Oralia Manis, MD Inpatient   05/13/2016 0435 05/13/2016 1813 Full Code [id number]  Arnaldo Natal Inpatient   06/21/2015 0533 06/21/2015 1553 Full Code [id number]  Ihor Austin, MD ED       Prognosis: Poor  Thank you for allowing the Palliative Medicine Team to assist in the care of this patient.    Morton Stall, NP  Please contact Palliative Medicine Team phone at [phone] for questions and concerns.

## 2022-03-27 LAB — CULTURE, BLOOD (ROUTINE X 2): Special Requests: ADEQUATE

## 2022-03-27 LAB — CBC
HCT: 36.1 % — ABNORMAL LOW (ref 39.0–52.0)
Hemoglobin: 12 g/dL — ABNORMAL LOW (ref 13.0–17.0)
MCH: 27.2 pg (ref 26.0–34.0)
MCHC: 33.2 g/dL (ref 30.0–36.0)
MCV: 81.9 fL (ref 80.0–100.0)
Platelets: 87 10*3/uL — ABNORMAL LOW (ref 150–400)
RBC: 4.41 MIL/uL (ref 4.22–5.81)
RDW: 16.3 % — ABNORMAL HIGH (ref 11.5–15.5)
WBC: 14.1 10*3/uL — ABNORMAL HIGH (ref 4.0–10.5)
nRBC: 0.3 % — ABNORMAL HIGH (ref 0.0–0.2)

## 2022-03-27 LAB — BASIC METABOLIC PANEL
Anion gap: 8 (ref 5–15)
BUN: 95 mg/dL — ABNORMAL HIGH (ref 8–23)
CO2: 20 mmol/L — ABNORMAL LOW (ref 22–32)
Calcium: 6.9 mg/dL — ABNORMAL LOW (ref 8.9–10.3)
Chloride: 114 mmol/L — ABNORMAL HIGH (ref 98–111)
Creatinine, Ser: 2.64 mg/dL — ABNORMAL HIGH (ref 0.61–1.24)
GFR, Estimated: 22 mL/min — ABNORMAL LOW (ref >60)
Glucose, Bld: 154 mg/dL — ABNORMAL HIGH (ref 70–99)
Potassium: 4.5 mmol/L (ref 3.5–5.1)
Sodium: 142 mmol/L (ref 135–145)

## 2022-03-27 LAB — GLUCOSE, CAPILLARY
Glucose-Capillary: 129 mg/dL — ABNORMAL HIGH (ref 70–99)
Glucose-Capillary: 129 mg/dL — ABNORMAL HIGH (ref 70–99)
Glucose-Capillary: 121 mg/dL — ABNORMAL HIGH (ref 70–99)
Glucose-Capillary: 115 mg/dL — ABNORMAL HIGH (ref 70–99)

## 2022-03-27 LAB — D-DIMER, QUANTITATIVE: D-Dimer, Quant: 7.24 ug/mL-FEU — ABNORMAL HIGH (ref 0.00–0.50)

## 2022-03-27 LAB — PHOSPHORUS: Phosphorus: 5.9 mg/dL — ABNORMAL HIGH (ref 2.5–4.6)

## 2022-03-27 LAB — FERRITIN: Ferritin: >7500 ng/mL — ABNORMAL HIGH (ref 24–336)

## 2022-03-27 LAB — C-REACTIVE PROTEIN: CRP: 19.2 mg/dL — ABNORMAL HIGH (ref <1.0)

## 2022-03-27 LAB — MAGNESIUM: Magnesium: 2.5 mg/dL — ABNORMAL HIGH (ref 1.7–2.4)

## 2022-03-27 NOTE — Progress Notes (Signed)
Pt's daughter, [name] (Dispenza) [phone], called wanting updates on her father.... Explained to her that I do not have her on the list to release information to... She was upset and states that she has not seen or spoken to her father in 10 years... Sts she has tried to call step daughter and text her but she has not received a call back from her.... Wants [place] to call [name] (Step-daughter) and have her give her an update.   Called Ms. [name] and notified her... sts she has already spoken to [name]'s brother (Joe Petronio), and he was to update [name].... sts she will call Joe and have him call Ms. [name] for updates.

## 2022-03-27 NOTE — Progress Notes (Signed)
NAMEKishawn [name], MRN:  [id number], DOB:  1930/07/30, LOS: 3 ADMISSION DATE:  04/14/2022, CONSULTATION DATE:  04/13/2022 REFERRING MD:  Dr. Blaine Hamper, CHIEF COMPLAINT:  Altered Mental Status, generalized weakness   Brief Pt Description / Synopsis:  88 y.o. Male admitted with Acute Metabolic Encephalopathy and severe sepsis with Septic shock in setting of UTI & COVID-19 infection.  Also found to have AKI.  History of Present Illness:  [name] is a 88 y.o. male with a past medical history significant for indwelling foley catheter, hypertension, hyperlipidemia, asthma, stroke, depression, severe aortic stenosis, mitral valve regurgitation, CAD, HFpEF, hyperbilirubinemia, with who presents to [facility] ED due to altered mental status and generalized weakness.  Pt remains altered and is unable to contribute to history, therefore history is obtained from chart review.  Per ED and nursing notes, he was sent in from his nursing facility due to altered mental status.  Patient's chronic indwelling Foley catheter was removed, and was unable to be replaced at the facility.  He was noted to have fever and chills, and to be incontinent with malodorous urine.   Please see "significant hospital events" section below for full detailed hospital course.  03/25/22- patient is more alert this am but is not appropriate with encephalopathy reaching and pulling on catheters.  He will start PT/OT today.  He is bacteremic with Klebsiella pneumoniae  03/26/22- patient on precedex much less aggitated not pulling on a line or central venous catheter. He's off levophed but still on vaso. He had PT/OT yesterday.  He has palliative care following.  He's on rocephin for bacteramia.  03/27/22- patient remains critically ill, he is on vasopressin, he is with worsening renal failure, and thrombocytopenia suspect due to Sepsis.  Met with daughter and reviewed goals of care , wishes to continue full code scope of care at this time.    Pertinent  Medical History   Past Medical History:  Diagnosis Date   (HFpEF) heart failure with preserved ejection fraction ([place])    a. 03/2018 Echo: EF 60-65%, Gr1 DD.   Aortic atherosclerosis ([place]) 05/2013   Per TEE   Arrhythmia    Asthma    CHF (congestive heart failure) (HCC)    Colon polyp    Coronary artery disease    a. Nonobstructive by 2004 cath b. 05/2013 NSTEMI/Cath: >M 30, LAD 39m LCX 263mRCA 30p, EF 60%, ? Sev AS (mod by TEE).   GERD (gastroesophageal reflux disease)    Heart murmur    Hiatal hernia    Hyperlipidemia    Hypertension    Lymphedema    Mitral regurgitation    a. 03/2018 Echo: Mild to mod MR.   Obesity    Onychomycosis    Osteoarthritis    Recurrent cellulitis of lower extremity    Severe aortic stenosis    a. 2013 Echo: EF 55%, mod AS; b. TEE 05/2013: EF EF 60-65%, mod AS; c.10/2014 Echo: 55-60%, mod AS; d. 10/2016 Echo: EF 65-70%, Mod-Sev AS; e. 03/2018 [facility]: EF 60-65%, Sev AS, mild to mod AI. Mean grad (S) 5027m, Valve Area (VTI): 0.92cm^2.     Micro Data:  11/8: SARS-CoV-2 PCR>> Positive 11/8: Blood culture x2>> 11/8: Urine>>  Antimicrobials:  Cefepime 11/8 x1 dose Meropenem 11/8>> Molnupiravir 11/8>>  Significant Hospital Events: Including procedures, antibiotic start and stop dates in addition to other pertinent events   11/8: Admitted by Hospitalist.  Remained hypotensive despite IVF resuscitation. Levophed started, PCCM consulted.  Right IJ CVC and  right radial A-line placed    Objective   Blood pressure (!) 116/38, pulse (!) 52, temperature 97.7 F (36.5 C), temperature source Skin, resp. rate 18, height _0  (1.753 m), weight 107.7 kg, SpO2 100 %.        Intake/Output Summary (Last 24 hours) at 03/27/2022 1431 Last data filed at 03/27/2022 1400 Gross per 24 hour  Intake 1226.97 ml  Output --  Net 1226.97 ml    Filed Weights   04/08/2022 0846 04/11/2022 1351 03/26/22 0420  Weight: 106.6 kg 106.5 kg 107.7 kg     Examination: General: Acute on chronically ill-appearing male, laying in bed, on 2 L nasal cannula, no acute distress HENT: Atraumatic, normocephalic, neck supple, positive JVD Lungs: Coarse breath sounds throughout, even, nonlabored, mild tachypnea Cardiovascular: Tachycardia, regular rhythm, S1-S2, positive murmur Abdomen: Soft, nontender, nondistended, no guarding room tenderness, bowel sounds positive x4 Extremities: 1+ pitting edema bilateral lower extremities, chronic venous insufficiency trophic changes and lymphedema, 1+ distal pulses Neuro: Somnolent, withdraws from pain, does not follow commands, pupils PERRLA GU: Foley catheter in place draining dark yellow urine  Resolved Hospital Problem list     Assessment & Plan:   Septic Shock-PRESENT ON ADMISSION - DUE TO KLEBSIELLA BACTEREMIA -Acute on Chronic HFpEF PMHx: HFpEF, CAD, aortic stenosis, mitral valve regurgitation , HTN, HLD Echocardiogram 02/26/2022: LVEF 55/60%, indeterminate Diastolic parameters, RV function normal, Moderate MR, moderate AR, severe AS -Continuous cardiac monitoring -Maintain MAP >65 -Cautious IV fluids -Vasopressors as needed to maintain MAP goal -Continue Midodrine if able to tolerate PO -Start stress dose steroids -Trend lactic acid until normalized -Check HS Troponin  -Consider repeat Echocardiogram  -Diuresis as blood pressure and renal function permits ~currently unable to diurese due to shock  COVID-19 Infection -Monitor fever curve -Trend WBC's, Procalcitonin, and inflammatory markers -Follow cultures as above -Continue empiric Meropenem &  Molnupiravir pending cultures & sensitivities -Continue steroids -DC REMDESEVIR  Acute Kidney Injury CT Abdomen & Pelvis without hydronephrosis & no definitive stones -Monitor I&O's / urinary output -Follow BMP -Ensure adequate renal perfusion -Avoid nephrotoxic agents as able -Replace electrolytes as indicated -IV fluids -Trend CK -Low  threshold for Nephrology consult  Elevated LFT's PMHx: Hyperbilirubinemia CT Abdomen & Pelvis without acute intra-abdominal findings -Trend LFTs  Thrombocytopenia   Suspect sepsis related , ddimer elevated , will order fibronogen , no oozing peripherally   Acute Metabolic Encephalopathy due to severe sepsis and AKI PMHx: Depression -Treatment of sepsis and metabolic disorders as outlined above -Avoid sedating meds as able -Provide supportive care -N.p.o. for now, currently protecting airway, but high risk for intubation -CT head is currently pending -Check ABG, UDS & thyroid panel    Patient is critically ill with severe shock and multiorgan failure.  Prognosis is extremely guarded.  High risk for decompensation, need for intubation, cardiac arrest and death.  Given advanced age and comorbidities recommend DNR status.  Best Practice (right click and "Reselect all SmartList Selections" daily)   Diet/type: NPO DVT prophylaxis: prophylactic heparin  GI prophylaxis: N/A Lines: N/A Foley:  Yes, and it is still needed Code Status:  full code Last date of multidisciplinary goals of care discussion [N/A]  11/8: Updated patient's stepdaughter at bedside  Labs   CBC: Recent Labs  Lab 04/04/2022 0845 03/25/22 0355 03/26/22 0359 03/27/22 0500  WBC 13.5* 21.2* 14.5* 14.1*  NEUTROABS 12.6*  --   --   --   HGB 13.1 11.3* 11.3* 12.0*  HCT 40.1 34.0* 34.2* 36.1*  MCV  83.2 82.5 82.4 81.9  PLT 154 122* 107* 87*     Basic Metabolic Panel: Recent Labs  Lab 03/28/2022 0845 03/25/22 0355 03/26/22 0359 03/27/22 0756  NA 143 143 143 142  K 4.3 4.4 4.6 4.5  CL 108 111 111 114*  CO2 24 21* 22 20*  GLUCOSE 61* 123* 175* 154*  BUN 59* 59* 76* 95*  CREATININE 4.01* 2.97* 2.50* 2.64*  CALCIUM 8.3* 7.7* 7.4* 6.9*  MG  --  2.0 2.3 2.5*  PHOS  --  4.2 5.7* 5.9*    GFR: Estimated Creatinine Clearance: 22.9 mL/min (A) (by C-G formula based on SCr of 2.64 mg/dL (H)). Recent Labs   Lab 03/21/2022 0844 04/11/2022 0845 03/23/2022 0845 03/22/2022 1126 04/13/2022 1614 04/10/2022 1840 03/25/22 0355 03/26/22 0359 03/27/22 0500  PROCALCITON 13.92  --   --   --   --   --  18.50 15.13  --   WBC  --  13.5*  --   --   --   --  21.2* 14.5* 14.1*  LATICACIDVEN  --  3.9*   < > 3.7* 2.4* 2.2* 1.9  --   --    < > = values in this interval not displayed.     Liver Function Tests: Recent Labs  Lab 04/12/2022 0845 03/25/22 0355 03/26/22 0359  AST 65* 59* 77*  ALT 29 28 34  ALKPHOS 171* 98 99  BILITOT 3.1* 2.6* 1.7*  PROT 6.1* 5.4* 5.5*  ALBUMIN 2.6* 2.3* 2.2*    No results for input(s): "LIPASE", "AMYLASE" in the last 168 hours. No results for input(s): "AMMONIA" in the last 168 hours.  ABG    Component Value Date/Time   PHART 7.39 03/29/2022 1635   PCO2ART 39 04/08/2022 1635   PO2ART 73 (L) 04/09/2022 1635   HCO3 23.5 03/25/2022 0355   ACIDBASEDEF 1.1 03/25/2022 0355   O2SAT 74.7 03/25/2022 0355     Coagulation Profile: Recent Labs  Lab 04/07/2022 0845  INR 1.6*     Cardiac Enzymes: Recent Labs  Lab 03/23/2022 0844 03/25/22 0355  CKTOTAL 484* 322     HbA1C: Hemoglobin A1C  Date/Time Value Ref Range Status  05/21/2013 04:03 AM 5.9 4.2 - 6.3 % Final    Comment:    The American Diabetes Association recommends that a primary goal of therapy should be <7% and that physicians should reevaluate the treatment regimen in patients with HbA1c values consistently >8%.    Hgb A1c MFr Bld  Date/Time Value Ref Range Status  02/24/2022 08:52 PM 4.7 (L) 4.8 - 5.6 % Final    Comment:    (NOTE) Pre diabetes:          5.7%-6.4%  Diabetes:              >6.4%  Glycemic control for   <7.0% adults with diabetes   11/28/2019 03:38 PM 5.2 4.6 - 6.5 % Final    Comment:    Glycemic Control Guidelines for People with Diabetes:Non Diabetic:  <6%Goal of Therapy: <7%Additional Action Suggested:  >8%     CBG: Recent Labs  Lab 03/26/22 1510 03/26/22 1941 03/26/22 2336  03/27/22 0723 03/27/22 1201  GLUCAP 150* 155* 134* 129* 129*     Review of Systems:   Unable to assess due to altered mental status  Past Medical History:  He,  has a past medical history of (HFpEF) heart failure with preserved ejection fraction ([place]), Aortic atherosclerosis ([place]) (05/2013), Arrhythmia, Asthma, CHF (congestive heart failure) ([place]),  Colon polyp, Coronary artery disease, GERD (gastroesophageal reflux disease), Heart murmur, Hiatal hernia, Hyperlipidemia, Hypertension, Lymphedema, Mitral regurgitation, Obesity, Onychomycosis, Osteoarthritis, Recurrent cellulitis of lower extremity, and Severe aortic stenosis.   Surgical History:   Past Surgical History:  Procedure Laterality Date   CARDIAC CATHETERIZATION  11/2002   CARDIAC CATHETERIZATION  05/21/2013   showing 30% oLM, 30% D1, 20% mLCx, 30% pRCA; EF 60%, severe AS (mean gradient 28 mmHg, peak 26, area 0.9)   CHOLECYSTECTOMY  2001   TRANSESOPHAGEAL ECHOCARDIOGRAM  05/22/2013   Preserved EF, moderate AS (valve area by planimetry between 1.0-1.26 cm sq), moderate aortic arch and descending aortic atherosclerosis.    TRANSTHORACIC ECHOCARDIOGRAM  05/19/2013   EF 60-65%, impaired diastolic function, mild LA dilatation, mild MR/AI/TR, mod AS, high normal RVSP (30.4 mmHg)     Social History:   reports that he quit smoking about 41 years ago. His smoking use included cigarettes. He has a 30.00 pack-year smoking history. He has never used smokeless tobacco. He reports that he does not drink alcohol and does not use drugs.   Family History:  His family history includes Heart disease in his mother; Heart disease (age of onset: 72) in his father; Stroke in his brother.   Allergies Allergies  Allergen Reactions   Aspirin Rash, Other (See Comments) and Hives    Reaction: Trouble breathing      Home Medications  Prior to Admission medications   Medication Sig Start Date End Date Taking? Authorizing Provider  clopidogrel (PLAVIX)  75 MG tablet TAKE 1 TABLET(75 MG) BY MOUTH DAILY Patient taking differently: Take 75 mg by mouth daily. 06/03/21  Yes Loel Dubonnet, NP  folic acid (FOLVITE) 1 MG tablet Take 1 tablet (1 mg total) by mouth daily. 03/03/22 04/04/22 Yes Pudota, Kathe Becton, MD  metoprolol tartrate (LOPRESSOR) 25 MG tablet Take 25 mg by mouth daily.   Yes [provider]  Multiple Vitamin (MULTIVITAMIN WITH MINERALS) TABS tablet Take 1 tablet by mouth daily. 03/03/22 2022-04-04 Yes Pudota, Kathe Becton, MD  nystatin powder Apply 1 Application topically 2 (two) times daily.   Yes [provider]  oxycodone (OXY-IR) 5 MG capsule Take 5 mg by mouth every 4 (four) hours as needed for pain.   Yes [provider]  polyethylene glycol (MIRALAX / GLYCOLAX) 17 g packet Take 17 g by mouth daily.   Yes [provider]  QUEtiapine (SEROQUEL) 25 MG tablet Take 1 tablet (25 mg total) by mouth at bedtime. 03/02/22 04/01/22 Yes Pudota, Kathe Becton, MD  rosuvastatin (CRESTOR) 20 MG tablet Take 1 tablet (20 mg total) by mouth daily. 03/03/22 04-Apr-2022 Yes Pudota, Kathe Becton, MD  sennosides-docusate sodium (SENOKOT-S) 8.6-50 MG tablet Take 1 tablet by mouth daily.   Yes [provider]  thiamine (VITAMIN B-1) 100 MG tablet Take 1 tablet (100 mg total) by mouth daily. 03/03/22 04-04-2022 Yes Pudota, Kathe Becton, MD  zinc sulfate 220 (50 Zn) MG capsule Take 220 mg by mouth daily.   Yes [provider]  metoprolol succinate (TOPROL-XL) 25 MG 24 hr tablet TAKE 1 TABLET(25 MG) BY MOUTH DAILY Patient taking differently: Take 25 mg by mouth daily. 04/14/21   Loel Dubonnet, NP     Critical care provider statement:   Total critical care time: 33 minutes   Performed by: Lanney Gins MD   Critical care time was exclusive of separately billable procedures and treating other patients.   Critical care was necessary to treat or prevent imminent or  life-threatening deterioration.   Critical care  was time spent personally by me on the following activities: development of treatment plan with patient and/or surrogate as well as nursing, discussions with consultants, evaluation of patient's response to treatment, examination of patient, obtaining history from patient or surrogate, ordering and performing treatments and interventions, ordering and review of laboratory studies, ordering and review of radiographic studies, pulse oximetry and re-evaluation of patient's condition.    Ottie Glazier, M.D.  Pulmonary & Critical Care Medicine

## 2022-03-27 NOTE — Progress Notes (Signed)
[name] (step-daughter) called and sts that [name] (daughter) can receive updates and information... [name]'s phone number is [phone].

## 2022-03-28 LAB — GLUCOSE, CAPILLARY
Glucose-Capillary: 103 mg/dL — ABNORMAL HIGH (ref 70–99)
Glucose-Capillary: 112 mg/dL — ABNORMAL HIGH (ref 70–99)
Glucose-Capillary: 114 mg/dL — ABNORMAL HIGH (ref 70–99)
Glucose-Capillary: 129 mg/dL — ABNORMAL HIGH (ref 70–99)
Glucose-Capillary: 18 mg/dL — CL (ref 70–99)
Glucose-Capillary: 288 mg/dL — ABNORMAL HIGH (ref 70–99)
Glucose-Capillary: 84 mg/dL (ref 70–99)
Glucose-Capillary: 96 mg/dL (ref 70–99)
Glucose-Capillary: <10 mg/dL — CL (ref 70–99)

## 2022-03-28 LAB — BASIC METABOLIC PANEL
Anion gap: 7 (ref 5–15)
BUN: 98 mg/dL — ABNORMAL HIGH (ref 8–23)
CO2: 21 mmol/L — ABNORMAL LOW (ref 22–32)
Calcium: 7 mg/dL — ABNORMAL LOW (ref 8.9–10.3)
Chloride: 117 mmol/L — ABNORMAL HIGH (ref 98–111)
Creatinine, Ser: 2.33 mg/dL — ABNORMAL HIGH (ref 0.61–1.24)
GFR, Estimated: 26 mL/min — ABNORMAL LOW (ref >60)
Glucose, Bld: 122 mg/dL — ABNORMAL HIGH (ref 70–99)
Potassium: 4.3 mmol/L (ref 3.5–5.1)
Sodium: 145 mmol/L (ref 135–145)

## 2022-03-28 LAB — MAGNESIUM: Magnesium: 2.6 mg/dL — ABNORMAL HIGH (ref 1.7–2.4)

## 2022-03-28 LAB — CBC
HCT: 35.9 % — ABNORMAL LOW (ref 39.0–52.0)
Hemoglobin: 12 g/dL — ABNORMAL LOW (ref 13.0–17.0)
MCH: 27.5 pg (ref 26.0–34.0)
MCHC: 33.4 g/dL (ref 30.0–36.0)
MCV: 82.2 fL (ref 80.0–100.0)
Platelets: 74 10*3/uL — ABNORMAL LOW (ref 150–400)
RBC: 4.37 MIL/uL (ref 4.22–5.81)
RDW: 16.4 % — ABNORMAL HIGH (ref 11.5–15.5)
WBC: 10 10*3/uL (ref 4.0–10.5)
nRBC: 0.6 % — ABNORMAL HIGH (ref 0.0–0.2)

## 2022-03-28 LAB — FERRITIN: Ferritin: >7500 ng/mL — ABNORMAL HIGH (ref 24–336)

## 2022-03-28 LAB — PHOSPHORUS: Phosphorus: 5.4 mg/dL — ABNORMAL HIGH (ref 2.5–4.6)

## 2022-03-28 LAB — D-DIMER, QUANTITATIVE: D-Dimer, Quant: 14.4 ug/mL-FEU — ABNORMAL HIGH (ref 0.00–0.50)

## 2022-03-28 LAB — C-REACTIVE PROTEIN: CRP: 18.3 mg/dL — ABNORMAL HIGH (ref <1.0)

## 2022-03-28 MED ORDER — DEXTROSE 50 % IV SOLN
INTRAVENOUS | Status: AC
  Administered 2022-03-28: 50 mL via INTRAVENOUS
  Filled 2022-03-28: qty 50

## 2022-03-28 NOTE — Progress Notes (Signed)
PT Cancellation Note  Patient Details Name: Martez Liston MRN: [id number] DOB: 1931/03/29   Cancelled Treatment:    Reason Eval/Treat Not Completed: Fatigue/lethargy limiting ability to participate (Per conversation with RN at 1120 and 1352 pt continues to be unable to follow commands, therefore, he is not appropriate for PT treatment at this time. Will continue efforts as appropriate.)   Vira Blanco, PT, DPT 1:53 PM,03/28/22 Physical Therapist - [place] [facility]

## 2022-03-28 NOTE — Progress Notes (Addendum)
Cbg check on 2 fingers resulted in unreadable at<10 and critical low 18. A line not working to draw off. Gave D50 and repositioned A line until blood draw back started. Rechecked CBG 228.

## 2022-03-28 NOTE — Progress Notes (Signed)
PT Cancellation Note  Patient Details Name: [name] MRN: [id number] DOB: 1930/09/07   Cancelled Treatment:    Reason Eval/Treat Not Completed: Fatigue/lethargy limiting ability to participate;Patient not medically ready (Per chart review, pt continues to require Precedex with recent dosage administered at 0656 this morning. Also with blood glucose level of < 10 which is contraindicated for participation with PT services. Will follow up with patient care later today, as medically appropriate.)   Vira Blanco, PT, DPT 8:40 AM,03/28/22 Physical Therapist - [place] [facility]

## 2022-03-28 NOTE — Progress Notes (Signed)
NAMEJaston [name], MRN:  [id number], DOB:  09-06-1930, LOS: 4 ADMISSION DATE:  03/28/2022, CONSULTATION DATE:  03/31/2022 REFERRING MD:  Dr. Blaine Hamper, CHIEF COMPLAINT:  Altered Mental Status, generalized weakness   Brief Pt Description / Synopsis:  88 y.o. Male admitted with Acute Metabolic Encephalopathy and severe sepsis with Septic shock in setting of UTI & COVID-19 infection.  Also found to have AKI.  History of Present Illness:  [name] is a 88 y.o. male with a past medical history significant for indwelling foley catheter, hypertension, hyperlipidemia, asthma, stroke, depression, severe aortic stenosis, mitral valve regurgitation, CAD, HFpEF, hyperbilirubinemia, with who presents to [facility] ED due to altered mental status and generalized weakness.  Pt remains altered and is unable to contribute to history, therefore history is obtained from chart review.  Per ED and nursing notes, he was sent in from his nursing facility due to altered mental status.  Patient's chronic indwelling Foley catheter was removed, and was unable to be replaced at the facility.  He was noted to have fever and chills, and to be incontinent with malodorous urine.   Please see "significant hospital events" section below for full detailed hospital course.  03/25/22- patient is more alert this am but is not appropriate with encephalopathy reaching and pulling on catheters.  He will start PT/OT today.  He is bacteremic with Klebsiella pneumoniae  03/26/22- patient on precedex much less aggitated not pulling on a line or central venous catheter. He's off levophed but still on vaso. He had PT/OT yesterday.  He has palliative care following.  He's on rocephin for bacteramia.  03/27/22- patient remains critically ill, he is on vasopressin, he is with worsening renal failure, and thrombocytopenia suspect due to Sepsis.  Met with daughter and reviewed goals of care , wishes to continue full code scope of care at this time.  03/28/22-  patient remains critically ill with septic shock bactermia, met with daughter yesterday and had GOC still remains full code but high risk of loss of life.   Pertinent  Medical History   Past Medical History:  Diagnosis Date   (HFpEF) heart failure with preserved ejection fraction ([place])    a. 03/2018 Echo: EF 60-65%, Gr1 DD.   Aortic atherosclerosis ([place]) 05/2013   Per TEE   Arrhythmia    Asthma    CHF (congestive heart failure) (HCC)    Colon polyp    Coronary artery disease    a. Nonobstructive by 2004 cath b. 05/2013 NSTEMI/Cath: >M 30, LAD 54m LCX 25mRCA 30p, EF 60%, ? Sev AS (mod by TEE).   GERD (gastroesophageal reflux disease)    Heart murmur    Hiatal hernia    Hyperlipidemia    Hypertension    Lymphedema    Mitral regurgitation    a. 03/2018 Echo: Mild to mod MR.   Obesity    Onychomycosis    Osteoarthritis    Recurrent cellulitis of lower extremity    Severe aortic stenosis    a. 2013 Echo: EF 55%, mod AS; b. TEE 05/2013: EF EF 60-65%, mod AS; c.10/2014 Echo: 55-60%, mod AS; d. 10/2016 Echo: EF 65-70%, Mod-Sev AS; e. 03/2018 [facility]: EF 60-65%, Sev AS, mild to mod AI. Mean grad (S) 5057m, Valve Area (VTI): 0.92cm^2.     Micro Data:  11/8: SARS-CoV-2 PCR>> Positive 11/8: Blood culture x2>> 11/8: Urine>>  Antimicrobials:  Cefepime 11/8 x1 dose Meropenem 11/8>> Molnupiravir 11/8>>  Significant Hospital Events: Including procedures, antibiotic start and stop  dates in addition to other pertinent events   11/8: Admitted by Hospitalist.  Remained hypotensive despite IVF resuscitation. Levophed started, PCCM consulted.  Right IJ CVC and right radial A-line placed    Objective   Blood pressure (!) 109/32, pulse (!) 49, temperature 98.1 F (36.7 C), temperature source Axillary, resp. rate 14, height _0  (1.753 m), weight 107.7 kg, SpO2 100 %.        Intake/Output Summary (Last 24 hours) at 03/28/2022 0859 Last data filed at 03/28/2022 0845 Gross per 24 hour   Intake 658.21 ml  Output 355 ml  Net 303.21 ml    Filed Weights   04/04/2022 0846 04/03/2022 1351 03/26/22 0420  Weight: 106.6 kg 106.5 kg 107.7 kg    Examination: General: Acute on chronically ill-appearing male, laying in bed, on 2 L nasal cannula, no acute distress HENT: Atraumatic, normocephalic, neck supple, positive JVD Lungs: Coarse breath sounds throughout, even, nonlabored, mild tachypnea Cardiovascular: Tachycardia, regular rhythm, S1-S2, positive murmur Abdomen: Soft, nontender, nondistended, no guarding room tenderness, bowel sounds positive x4 Extremities: 1+ pitting edema bilateral lower extremities, chronic venous insufficiency trophic changes and lymphedema, 1+ distal pulses Neuro: Somnolent, withdraws from pain, does not follow commands, pupils PERRLA GU: Foley catheter in place draining dark yellow urine  Resolved Hospital Problem list     Assessment & Plan:   Septic Shock-PRESENT ON ADMISSION - DUE TO KLEBSIELLA BACTEREMIA -Acute on Chronic HFpEF PMHx: HFpEF, CAD, aortic stenosis, mitral valve regurgitation , HTN, HLD Echocardiogram 02/26/2022: LVEF 55/60%, indeterminate Diastolic parameters, RV function normal, Moderate MR, moderate AR, severe AS -Continuous cardiac monitoring -Maintain MAP >65 -Cautious IV fluids -Vasopressors as needed to maintain MAP goal -Continue Midodrine if able to tolerate PO -Start stress dose steroids -Trend lactic acid until normalized -Check HS Troponin  -Consider repeat Echocardiogram  -Diuresis as blood pressure and renal function permits ~currently unable to diurese due to shock  COVID-19 Infection -Monitor fever curve -Trend WBC's, Procalcitonin, and inflammatory markers -Follow cultures as above -Continue empiric Meropenem &  Molnupiravir pending cultures & sensitivities -Continue steroids -DC REMDESEVIR  Acute Kidney Injury CT Abdomen & Pelvis without hydronephrosis & no definitive stones -Monitor I&O's / urinary  output -Follow BMP -Ensure adequate renal perfusion -Avoid nephrotoxic agents as able -Replace electrolytes as indicated -IV fluids -Trend CK -Low threshold for Nephrology consult  Elevated LFT's PMHx: Hyperbilirubinemia CT Abdomen & Pelvis without acute intra-abdominal findings -Trend LFTs  Thrombocytopenia   Suspect sepsis related , ddimer elevated , will order fibronogen , no oozing peripherally   Acute Metabolic Encephalopathy due to severe sepsis and AKI PMHx: Depression -Treatment of sepsis and metabolic disorders as outlined above -Avoid sedating meds as able -Provide supportive care -N.p.o. for now, currently protecting airway, but high risk for intubation -CT head is currently pending -Check ABG, UDS & thyroid panel    Patient is critically ill with severe shock and multiorgan failure.  Prognosis is extremely guarded.  High risk for decompensation, need for intubation, cardiac arrest and death.  Given advanced age and comorbidities recommend DNR status.  Best Practice (right click and "Reselect all SmartList Selections" daily)   Diet/type: NPO DVT prophylaxis: prophylactic heparin  GI prophylaxis: N/A Lines: N/A Foley:  Yes, and it is still needed Code Status:  full code Last date of multidisciplinary goals of care discussion [N/A]  11/8: Updated patient's stepdaughter at bedside  Labs   CBC: Recent Labs  Lab 03/26/2022 0845 03/25/22 0355 03/26/22 0359 03/27/22 0500 03/28/22 3662  WBC 13.5* 21.2* 14.5* 14.1* 10.0  NEUTROABS 12.6*  --   --   --   --   HGB 13.1 11.3* 11.3* 12.0* 12.0*  HCT 40.1 34.0* 34.2* 36.1* 35.9*  MCV 83.2 82.5 82.4 81.9 82.2  PLT 154 122* 107* 87* 74*     Basic Metabolic Panel: Recent Labs  Lab 04/03/2022 0845 03/25/22 0355 03/26/22 0359 03/27/22 0756 03/28/22 0512  NA 143 143 143 142 145  K 4.3 4.4 4.6 4.5 4.3  CL 108 111 111 114* 117*  CO2 24 21* 22 20* 21*  GLUCOSE 61* 123* 175* 154* 122*  BUN 59* 59* 76* 95* 98*   CREATININE 4.01* 2.97* 2.50* 2.64* 2.33*  CALCIUM 8.3* 7.7* 7.4* 6.9* 7.0*  MG  --  2.0 2.3 2.5* 2.6*  PHOS  --  4.2 5.7* 5.9* 5.4*    GFR: Estimated Creatinine Clearance: 26 mL/min (A) (by C-G formula based on SCr of 2.33 mg/dL (H)). Recent Labs  Lab 04/09/2022 0844 04/12/2022 0845 03/27/2022 1126 03/23/2022 1614 03/18/2022 1840 03/25/22 0355 03/26/22 0359 03/27/22 0500 03/28/22 0512  PROCALCITON 13.92  --   --   --   --  18.50 15.13  --   --   WBC  --    < >  --   --   --  21.2* 14.5* 14.1* 10.0  LATICACIDVEN  --    < > 3.7* 2.4* 2.2* 1.9  --   --   --    < > = values in this interval not displayed.     Liver Function Tests: Recent Labs  Lab 04/15/2022 0845 03/25/22 0355 03/26/22 0359  AST 65* 59* 77*  ALT 29 28 34  ALKPHOS 171* 98 99  BILITOT 3.1* 2.6* 1.7*  PROT 6.1* 5.4* 5.5*  ALBUMIN 2.6* 2.3* 2.2*    No results for input(s): "LIPASE", "AMYLASE" in the last 168 hours. No results for input(s): "AMMONIA" in the last 168 hours.  ABG    Component Value Date/Time   PHART 7.39 03/27/2022 1635   PCO2ART 39 03/19/2022 1635   PO2ART 73 (L) 03/18/2022 1635   HCO3 23.5 03/25/2022 0355   ACIDBASEDEF 1.1 03/25/2022 0355   O2SAT 74.7 03/25/2022 0355     Coagulation Profile: Recent Labs  Lab 04/08/2022 0845  INR 1.6*     Cardiac Enzymes: Recent Labs  Lab 03/31/2022 0844 03/25/22 0355  CKTOTAL 484* 322     HbA1C: Hemoglobin A1C  Date/Time Value Ref Range Status  05/21/2013 04:03 AM 5.9 4.2 - 6.3 % Final    Comment:    The American Diabetes Association recommends that a primary goal of therapy should be <7% and that physicians should reevaluate the treatment regimen in patients with HbA1c values consistently >8%.    Hgb A1c MFr Bld  Date/Time Value Ref Range Status  02/24/2022 08:52 PM 4.7 (L) 4.8 - 5.6 % Final    Comment:    (NOTE) Pre diabetes:          5.7%-6.4%  Diabetes:              >6.4%  Glycemic control for   <7.0% adults with diabetes    11/28/2019 03:38 PM 5.2 4.6 - 6.5 % Final    Comment:    Glycemic Control Guidelines for People with Diabetes:Non Diabetic:  <6%Goal of Therapy: <7%Additional Action Suggested:  >8%     CBG: Recent Labs  Lab 03/28/22 0019 03/28/22 0357 03/28/22 0830 03/28/22 0833 03/28/22 [place]  112* 103* <10* 18* 288*     Review of Systems:   Unable to assess due to altered mental status  Past Medical History:  He,  has a past medical history of (HFpEF) heart failure with preserved ejection fraction ([place]), Aortic atherosclerosis ([place]) (05/2013), Arrhythmia, Asthma, CHF (congestive heart failure) ([place]), Colon polyp, Coronary artery disease, GERD (gastroesophageal reflux disease), Heart murmur, Hiatal hernia, Hyperlipidemia, Hypertension, Lymphedema, Mitral regurgitation, Obesity, Onychomycosis, Osteoarthritis, Recurrent cellulitis of lower extremity, and Severe aortic stenosis.   Surgical History:   Past Surgical History:  Procedure Laterality Date   CARDIAC CATHETERIZATION  11/2002   CARDIAC CATHETERIZATION  05/21/2013   showing 30% oLM, 30% D1, 20% mLCx, 30% pRCA; EF 60%, severe AS (mean gradient 28 mmHg, peak 26, area 0.9)   CHOLECYSTECTOMY  2001   TRANSESOPHAGEAL ECHOCARDIOGRAM  05/22/2013   Preserved EF, moderate AS (valve area by planimetry between 1.0-1.26 cm sq), moderate aortic arch and descending aortic atherosclerosis.    TRANSTHORACIC ECHOCARDIOGRAM  05/19/2013   EF 60-65%, impaired diastolic function, mild LA dilatation, mild MR/AI/TR, mod AS, high normal RVSP (30.4 mmHg)     Social History:   reports that he quit smoking about 41 years ago. His smoking use included cigarettes. He has a 30.00 pack-year smoking history. He has never used smokeless tobacco. He reports that he does not drink alcohol and does not use drugs.   Family History:  His family history includes Heart disease in his mother; Heart disease (age of onset: 50) in his father; Stroke in his brother.    Allergies Allergies  Allergen Reactions   Aspirin Rash, Other (See Comments) and Hives    Reaction: Trouble breathing      Home Medications  Prior to Admission medications   Medication Sig Start Date End Date Taking? Authorizing Provider  clopidogrel (PLAVIX) 75 MG tablet TAKE 1 TABLET(75 MG) BY MOUTH DAILY Patient taking differently: Take 75 mg by mouth daily. 06/03/21  Yes Loel Dubonnet, NP  folic acid (FOLVITE) 1 MG tablet Take 1 tablet (1 mg total) by mouth daily. 03/03/22 04-28-22 Yes Pudota, Kathe Becton, MD  metoprolol tartrate (LOPRESSOR) 25 MG tablet Take 25 mg by mouth daily.   Yes [provider]  Multiple Vitamin (MULTIVITAMIN WITH MINERALS) TABS tablet Take 1 tablet by mouth daily. 03/03/22 04-28-22 Yes Pudota, Kathe Becton, MD  nystatin powder Apply 1 Application topically 2 (two) times daily.   Yes [provider]  oxycodone (OXY-IR) 5 MG capsule Take 5 mg by mouth every 4 (four) hours as needed for pain.   Yes [provider]  polyethylene glycol (MIRALAX / GLYCOLAX) 17 g packet Take 17 g by mouth daily.   Yes [provider]  QUEtiapine (SEROQUEL) 25 MG tablet Take 1 tablet (25 mg total) by mouth at bedtime. 03/02/22 04/01/22 Yes Pudota, Kathe Becton, MD  rosuvastatin (CRESTOR) 20 MG tablet Take 1 tablet (20 mg total) by mouth daily. 03/03/22 2022/04/28 Yes Pudota, Kathe Becton, MD  sennosides-docusate sodium (SENOKOT-S) 8.6-50 MG tablet Take 1 tablet by mouth daily.   Yes [provider]  thiamine (VITAMIN B-1) 100 MG tablet Take 1 tablet (100 mg total) by mouth daily. 03/03/22 04-28-22 Yes Pudota, Kathe Becton, MD  zinc sulfate 220 (50 Zn) MG capsule Take 220 mg by mouth daily.   Yes [provider]  metoprolol succinate (TOPROL-XL) 25 MG 24 hr tablet TAKE 1 TABLET(25 MG) BY MOUTH DAILY Patient taking differently: Take 25 mg by mouth daily. 04/14/21  Loel Dubonnet, NP     Critical care provider statement:   Total  critical care time: 33 minutes   Performed by: Lanney Gins MD   Critical care time was exclusive of separately billable procedures and treating other patients.   Critical care was necessary to treat or prevent imminent or life-threatening deterioration.   Critical care was time spent personally by me on the following activities: development of treatment plan with patient and/or surrogate as well as nursing, discussions with consultants, evaluation of patient's response to treatment, examination of patient, obtaining history from patient or surrogate, ordering and performing treatments and interventions, ordering and review of laboratory studies, ordering and review of radiographic studies, pulse oximetry and re-evaluation of patient's condition.    Ottie Glazier, M.D.  Pulmonary & Critical Care Medicine

## 2022-03-29 ENCOUNTER — Inpatient Hospital Stay: Payer: Medicare Other

## 2022-03-29 DIAGNOSIS — Z7189 Other specified counseling: Secondary | ICD-10-CM | POA: Diagnosis not present

## 2022-03-29 DIAGNOSIS — R6521 Severe sepsis with septic shock: Secondary | ICD-10-CM | POA: Diagnosis not present

## 2022-03-29 LAB — BASIC METABOLIC PANEL
Anion gap: 9 (ref 5–15)
BUN: 94 mg/dL — ABNORMAL HIGH (ref 8–23)
CO2: 22 mmol/L (ref 22–32)
Calcium: 7.2 mg/dL — ABNORMAL LOW (ref 8.9–10.3)
Chloride: 114 mmol/L — ABNORMAL HIGH (ref 98–111)
Creatinine, Ser: 2.24 mg/dL — ABNORMAL HIGH (ref 0.61–1.24)
GFR, Estimated: 27 mL/min — ABNORMAL LOW (ref >60)
Glucose, Bld: 102 mg/dL — ABNORMAL HIGH (ref 70–99)
Potassium: 4.1 mmol/L (ref 3.5–5.1)
Sodium: 145 mmol/L (ref 135–145)

## 2022-03-29 LAB — CBC
HCT: 36.4 % — ABNORMAL LOW (ref 39.0–52.0)
Hemoglobin: 11.9 g/dL — ABNORMAL LOW (ref 13.0–17.0)
MCH: 26.8 pg (ref 26.0–34.0)
MCHC: 32.7 g/dL (ref 30.0–36.0)
MCV: 82 fL (ref 80.0–100.0)
Platelets: 87 10*3/uL — ABNORMAL LOW (ref 150–400)
RBC: 4.44 MIL/uL (ref 4.22–5.81)
RDW: 17 % — ABNORMAL HIGH (ref 11.5–15.5)
WBC: 9.1 10*3/uL (ref 4.0–10.5)
nRBC: 2.3 % — ABNORMAL HIGH (ref 0.0–0.2)

## 2022-03-29 LAB — GLUCOSE, CAPILLARY
Glucose-Capillary: 121 mg/dL — ABNORMAL HIGH (ref 70–99)
Glucose-Capillary: 61 mg/dL — ABNORMAL LOW (ref 70–99)
Glucose-Capillary: 74 mg/dL (ref 70–99)
Glucose-Capillary: 85 mg/dL (ref 70–99)
Glucose-Capillary: 89 mg/dL (ref 70–99)
Glucose-Capillary: <10 mg/dL — CL (ref 70–99)

## 2022-03-29 LAB — LACTIC ACID, PLASMA: Lactic Acid, Venous: 2.1 mmol/L (ref 0.5–1.9)

## 2022-03-29 LAB — D-DIMER, QUANTITATIVE: D-Dimer, Quant: 15.18 ug/mL-FEU — ABNORMAL HIGH (ref 0.00–0.50)

## 2022-03-29 LAB — FERRITIN: Ferritin: >7500 ng/mL — ABNORMAL HIGH (ref 24–336)

## 2022-03-29 LAB — PHOSPHORUS: Phosphorus: 4.7 mg/dL — ABNORMAL HIGH (ref 2.5–4.6)

## 2022-03-29 LAB — MAGNESIUM: Magnesium: 2.5 mg/dL — ABNORMAL HIGH (ref 1.7–2.4)

## 2022-03-29 LAB — C-REACTIVE PROTEIN: CRP: 14.6 mg/dL — ABNORMAL HIGH (ref <1.0)

## 2022-03-29 MED ORDER — LACTATED RINGERS IV BOLUS: 1000.0000 mL | Freq: Once | INTRAVENOUS | Status: DC

## 2022-03-29 MED ORDER — CEFAZOLIN SODIUM-DEXTROSE 2-4 GM/100ML-% IV SOLN
2.0000 g | Freq: Two times a day (BID) | INTRAVENOUS | Status: DC
  Filled 2022-03-29: qty 100
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
## 2022-03-29 NOTE — Progress Notes (Signed)
[name], MRN:  [id number], DOB:  Mar 13, 1931, LOS: 5 ADMISSION DATE:  03/28/2022, CONSULTATION DATE:  03/22/2022 REFERRING MD:  Dr. Blaine Hamper, CHIEF COMPLAINT:  Altered Mental Status, generalized weakness   Brief Pt Description / Synopsis:  88 y.o. Male admitted with Acute Metabolic Encephalopathy and severe sepsis with Septic shock in setting of UTI & COVID-19 infection.  Also found to have AKI.  History of Present Illness:  [name] is a 88 y.o. male with a past medical history significant for indwelling foley catheter, hypertension, hyperlipidemia, asthma, stroke, depression, severe aortic stenosis, mitral valve regurgitation, CAD, HFpEF, hyperbilirubinemia, with who presents to [facility] ED due to altered mental status and generalized weakness.  Pt remains altered and is unable to contribute to history, therefore history is obtained from chart review.  Per ED and nursing notes, he was sent in from his nursing facility due to altered mental status.  Patient's chronic indwelling Foley catheter was removed, and was unable to be replaced at the facility.  He was noted to have fever and chills, and to be incontinent with malodorous urine.   Please see "significant hospital events" section below for full detailed hospital course.      Significant Hospital Events: Including procedures, antibiotic start and stop dates in addition to other pertinent events   11/8: Admitted by Hospitalist.  Remained hypotensive despite IVF resuscitation. Levophed started, PCCM consulted.  Right IJ CVC and right radial A-line placed 03/25/22- patient is more alert this am but is not appropriate with encephalopathy reaching and pulling on catheters.  He will start PT/OT today.  He is bacteremic with Klebsiella pneumoniae  03/26/22- patient on precedex much less aggitated not pulling on a line or central venous catheter. He's off levophed but still on vaso. He had PT/OT yesterday.  He has palliative care following.   He's on rocephin for bacteramia.  03/27/22- patient remains critically ill, he is on vasopressin, he is with worsening renal failure, and thrombocytopenia suspect due to Sepsis.  Met with daughter and reviewed goals of care , wishes to continue full code scope of care at this time.  03/28/22- patient remains critically ill with septic shock bactermia, met with daughter yesterday and had GOC still remains full code but high risk of loss of life.   11/13 remains encephalopathic  Pertinent  Medical History   Past Medical History:  Diagnosis Date   (HFpEF) heart failure with preserved ejection fraction ([place])    a. 03/2018 Echo: EF 60-65%, Gr1 DD.   Aortic atherosclerosis ([place]) 05/2013   Per TEE   Arrhythmia    Asthma    CHF (congestive heart failure) (HCC)    Colon polyp    Coronary artery disease    a. Nonobstructive by 2004 cath b. 05/2013 NSTEMI/Cath: >M 30, LAD 78m LCX 266mRCA 30p, EF 60%, ? Sev AS (mod by TEE).   GERD (gastroesophageal reflux disease)    Heart murmur    Hiatal hernia    Hyperlipidemia    Hypertension    Lymphedema    Mitral regurgitation    a. 03/2018 Echo: Mild to mod MR.   Obesity    Onychomycosis    Osteoarthritis    Recurrent cellulitis of lower extremity    Severe aortic stenosis    a. 2013 Echo: EF 55%, mod AS; b. TEE 05/2013: EF EF 60-65%, mod AS; c.10/2014 Echo: 55-60%, mod AS; d. 10/2016 Echo: EF 65-70%, Mod-Sev AS; e. 03/2018 Ehco: EF 60-65%, Sev AS, mild  to mod AI. Mean grad (S) 20mHg, Valve Area (VTI): 0.92cm^2.     Micro Data:  11/8: SARS-CoV-2 PCR>> Positive 11/8: Blood culture x2>> 11/8: Urine>>  Antimicrobials:  Cefepime 11/8 x1 dose Meropenem 11/8>> Molnupiravir 11/8>>   EVENTS OVERNIGHT Remains on pressors +encephalopathy AKI COVID infection  SEVERE LACTIC ACIDOSIS    Objective   Blood pressure (!) 99/31, pulse (!) 49, temperature 98.3 F (36.8 C), temperature source Axillary, resp. rate 16, height _0  (1.753 m), weight  109.4 kg, SpO2 100 %.        Intake/Output Summary (Last 24 hours) at 03/29/2022 0724 Last data filed at 03/29/2022 0600 Gross per 24 hour  Intake 451.49 ml  Output 763 ml  Net -311.51 ml    Filed Weights   04/08/2022 1351 03/26/22 0420 03/29/22 0617  Weight: 106.5 kg 107.7 kg 109.4 kg     REVIEW OF SYSTEMS  PATIENT IS UNABLE TO PROVIDE COMPLETE REVIEW OF SYSTEMS DUE TO SEVERE CRITICAL ILLNESS   PHYSICAL EXAMINATION:  GENERAL:critically ill appearing, EYES: Pupils equal, round, reactive to light.  No scleral icterus.  MOUTH: Moist mucosal membrane.  NECK: Supple.  PULMONARY: Lungs clear to auscultation, +rhonchi, +wheezing CARDIOVASCULAR: S1 and S2.  Regular rate and rhythm GASTROINTESTINAL: Soft, nontender, -distended. Positive bowel sounds.  MUSCULOSKELETAL: No swelling, clubbing, or edema.  NEUROLOGIC: obtunded SKIN:normal, warm to touch, Capillary refill delayed  Pulses present bilaterally    Assessment & Plan:   88yo white male admitted with severe septic shock Septic Shock-PRESENT ON ADMISSION - DUE TO KLEBSIELLA BACTEREMIA with COVID 19 infection complicated by severe acidosis and severe metabolic encephalopathy  SEPTIC shock SOURCE- KLEBSIELLA BACTEREMIA with COVID 19 infection  -use vasopressors to keep MAP>65 as needed -follow ABG and LA as needed -follow up cultures -emperic ABX -consider stress dose steroids -aggressive IV fluid Resuscitation -Diuresis as blood pressure and renal function permits ~currently unable to diurese due to shock  Severe COVID-19 infection Continue IV steroids  Maintain airborne and contact precautions  As needed bronchodilators (MDI) Vitamin C and zinc Antitussives High risk for intubation and death -Continue empiric Meropenem &  Molnupiravir pending cultures & sensitivities -Continue steroids -DC REMDESEVIR  ACUTE KIDNEY INJURY/Renal Failure -continue Foley Catheter-assess need -Avoid nephrotoxic agents -Follow  urine output, BMP -Ensure adequate renal perfusion, optimize oxygenation -Renal dose medications   Intake/Output Summary (Last 24 hours) at 03/29/2022 0729 Last data filed at 03/29/2022 0600 Gross per 24 hour  Intake 451.49 ml  Output 763 ml  Net -311.51 ml    Elevated LFT's PMHx: Hyperbilirubinemia CT Abdomen & Pelvis without acute intra-abdominal findings -Trend LFTs  Thrombocytopenia   Suspect sepsis related , ddimer elevated , will order fibronogen , no oozing peripherally    NEUROLOGY ACUTE METABOLIC ENCEPHALOPATHY -need for sedation Consider MRI brain to assess for CVA    Patient is critically ill with severe shock and multiorgan failure.  Prognosis is extremely guarded.  High risk for decompensation, need for intubation, cardiac arrest and death.  Given advanced age and comorbidities recommend DNR status.  Best Practice (right click and "Reselect all SmartList Selections" daily)   Diet/type: NPO DVT prophylaxis: prophylactic heparin  GI prophylaxis: N/A Lines: N/A Foley:  Yes, and it is still needed Code Status:  full code Last date of multidisciplinary goals of care discussion [N/A]    Labs   CBC: Recent Labs  Lab 04/01/2022 0845 03/25/22 0355 03/26/22 0359 03/27/22 0500 03/28/22 0512 03/29/22 0414  WBC 13.5* 21.2* 14.5* 14.1*  10.0 9.1  NEUTROABS 12.6*  --   --   --   --   --   HGB 13.1 11.3* 11.3* 12.0* 12.0* 11.9*  HCT 40.1 34.0* 34.2* 36.1* 35.9* 36.4*  MCV 83.2 82.5 82.4 81.9 82.2 82.0  PLT 154 122* 107* 87* 74* 87*     Basic Metabolic Panel: Recent Labs  Lab 03/25/22 0355 03/26/22 0359 03/27/22 0756 03/28/22 0512 03/29/22 0414  NA 143 143 142 145 145  K 4.4 4.6 4.5 4.3 4.1  CL 111 111 114* 117* 114*  CO2 21* 22 20* 21* 22  GLUCOSE 123* 175* 154* 122* 102*  BUN 59* 76* 95* 98* 94*  CREATININE 2.97* 2.50* 2.64* 2.33* 2.24*  CALCIUM 7.7* 7.4* 6.9* 7.0* 7.2*  MG 2.0 2.3 2.5* 2.6* 2.5*  PHOS 4.2 5.7* 5.9* 5.4* 4.7*     GFR: Estimated Creatinine Clearance: 27.3 mL/min (A) (by C-G formula based on SCr of 2.24 mg/dL (H)). Recent Labs  Lab 03/21/2022 0844 04/14/2022 0845 04/01/2022 1126 04/04/2022 1614 03/25/2022 1840 03/25/22 0355 03/26/22 0359 03/27/22 0500 03/28/22 0512 03/29/22 0414  PROCALCITON 13.92  --   --   --   --  18.50 15.13  --   --   --   WBC  --    < >  --   --   --  21.2* 14.5* 14.1* 10.0 9.1  LATICACIDVEN  --    < > 3.7* 2.4* 2.2* 1.9  --   --   --   --    < > = values in this interval not displayed.     Liver Function Tests: Recent Labs  Lab 03/23/2022 0845 03/25/22 0355 03/26/22 0359  AST 65* 59* 77*  ALT 29 28 34  ALKPHOS 171* 98 99  BILITOT 3.1* 2.6* 1.7*  PROT 6.1* 5.4* 5.5*  ALBUMIN 2.6* 2.3* 2.2*    No results for input(s): "LIPASE", "AMYLASE" in the last 168 hours. No results for input(s): "AMMONIA" in the last 168 hours.  ABG    Component Value Date/Time   PHART 7.39 04/14/2022 1635   PCO2ART 39 03/25/2022 1635   PO2ART 73 (L) 03/27/2022 1635   HCO3 23.5 03/25/2022 0355   ACIDBASEDEF 1.1 03/25/2022 0355   O2SAT 74.7 03/25/2022 0355     Coagulation Profile: Recent Labs  Lab 03/20/2022 0845  INR 1.6*     Cardiac Enzymes: Recent Labs  Lab 04/05/2022 0844 03/25/22 0355  CKTOTAL 484* 322     HbA1C: Hemoglobin A1C  Date/Time Value Ref Range Status  05/21/2013 04:03 AM 5.9 4.2 - 6.3 % Final    Comment:    The American Diabetes Association recommends that a primary goal of therapy should be <7% and that physicians should reevaluate the treatment regimen in patients with HbA1c values consistently >8%.    Hgb A1c MFr Bld  Date/Time Value Ref Range Status  02/24/2022 08:52 PM 4.7 (L) 4.8 - 5.6 % Final    Comment:    (NOTE) Pre diabetes:          5.7%-6.4%  Diabetes:              >6.4%  Glycemic control for   <7.0% adults with diabetes   11/28/2019 03:38 PM 5.2 4.6 - 6.5 % Final    Comment:    Glycemic Control Guidelines for People with  Diabetes:Non Diabetic:  <6%Goal of Therapy: <7%Additional Action Suggested:  >8%     CBG: Recent Labs  Lab 03/28/22 1117 03/28/22 1638  03/28/22 1955 03/28/22 2349 03/29/22 0421  GLUCAP 129* 114* 96 84 85     DVT/GI PRX  assessed I Assessed the need for Labs I Assessed the need for Foley I Assessed the need for Central Venous Line Family Discussion when available I Assessed the need for Mobilization I made an Assessment of medications to be adjusted accordingly Safety Risk assessment completed  CASE DISCUSSED IN MULTIDISCIPLINARY ROUNDS WITH ICU TEAM     Critical Care Time devoted to patient care services described in this note is 55 minutes.  Critical care was necessary to treat /prevent imminent and life-threatening deterioration. Overall, patient is critically ill, prognosis is guarded.  Patient with Multiorgan failure and at high risk for cardiac arrest and death.    Corrin Parker, M.D.  [facility]  Medical Director [place] Director [facility] Cardio-Pulmonary Department

## 2022-03-29 NOTE — Progress Notes (Signed)
Uneventful Zettlemoyer. Fingers and toes are cyanotic this am. Arterial line d/ced per verbal order - Dr.Kasa. Right arterial site did not bleed when removed.. Vasopressin and Precedex  discontinued per verbal order. 0900-1100 B/P borderline hypotensive. Does not open eyes. Does not move any extremity to pain.  Patient is in SB rate 40s to 50s. CVP checked. CVP 5-6. Given 1000 ml bolus of LR per verbal order. B/P rebounded and has stayed within acceptable limits. Patient has been lethargic and confused with incomprehensible speech all [place]. At times one word is understood. Screams help at times. Does not follow commands. Does not open eyes. Moves all extremities to pain this afternoon. Mumbles when turned. Sacral area has deep tissue injury with skin intact-covered with foam. All extremities cool this morning. Fingers remain slightly cyanotic. CBGs every 4 hours have ranged from 74-121. Step daughter called late this afternoon to check on patient.

## 2022-03-30 DIAGNOSIS — Z7189 Other specified counseling: Secondary | ICD-10-CM | POA: Diagnosis not present

## 2022-03-30 DIAGNOSIS — R6521 Severe sepsis with septic shock: Secondary | ICD-10-CM | POA: Diagnosis not present

## 2022-03-30 LAB — CBC
HCT: 36.1 % — ABNORMAL LOW (ref 39.0–52.0)
Hemoglobin: 11.7 g/dL — ABNORMAL LOW (ref 13.0–17.0)
MCH: 26.7 pg (ref 26.0–34.0)
MCHC: 32.4 g/dL (ref 30.0–36.0)
MCV: 82.4 fL (ref 80.0–100.0)
Platelets: 104 10*3/uL — ABNORMAL LOW (ref 150–400)
RBC: 4.38 MIL/uL (ref 4.22–5.81)
RDW: 17.2 % — ABNORMAL HIGH (ref 11.5–15.5)
WBC: 9 10*3/uL (ref 4.0–10.5)
nRBC: 1.4 % — ABNORMAL HIGH (ref 0.0–0.2)

## 2022-03-30 LAB — GLUCOSE, CAPILLARY
Glucose-Capillary: 115 mg/dL — ABNORMAL HIGH (ref 70–99)
Glucose-Capillary: 115 mg/dL — ABNORMAL HIGH (ref 70–99)
Glucose-Capillary: 128 mg/dL — ABNORMAL HIGH (ref 70–99)
Glucose-Capillary: 41 mg/dL — CL (ref 70–99)
Glucose-Capillary: 49 mg/dL — ABNORMAL LOW (ref 70–99)
Glucose-Capillary: 51 mg/dL — ABNORMAL LOW (ref 70–99)
Glucose-Capillary: 56 mg/dL — ABNORMAL LOW (ref 70–99)
Glucose-Capillary: 58 mg/dL — ABNORMAL LOW (ref 70–99)
Glucose-Capillary: 70 mg/dL (ref 70–99)
Glucose-Capillary: 98 mg/dL (ref 70–99)
Glucose-Capillary: <10 mg/dL — CL (ref 70–99)

## 2022-03-30 LAB — BASIC METABOLIC PANEL
Anion gap: 9 (ref 5–15)
BUN: 92 mg/dL — ABNORMAL HIGH (ref 8–23)
CO2: 23 mmol/L (ref 22–32)
Calcium: 7.3 mg/dL — ABNORMAL LOW (ref 8.9–10.3)
Chloride: 119 mmol/L — ABNORMAL HIGH (ref 98–111)
Creatinine, Ser: 2.01 mg/dL — ABNORMAL HIGH (ref 0.61–1.24)
GFR, Estimated: 31 mL/min — ABNORMAL LOW (ref >60)
Glucose, Bld: 75 mg/dL (ref 70–99)
Potassium: 3.4 mmol/L — ABNORMAL LOW (ref 3.5–5.1)
Sodium: 151 mmol/L — ABNORMAL HIGH (ref 135–145)

## 2022-03-30 LAB — PHOSPHORUS: Phosphorus: 3.9 mg/dL (ref 2.5–4.6)

## 2022-03-30 LAB — MAGNESIUM: Magnesium: 2.4 mg/dL (ref 1.7–2.4)

## 2022-03-30 MED ORDER — POTASSIUM CHLORIDE 10 MEQ/100ML IV SOLN
10.0000 meq | INTRAVENOUS | Status: AC
  Administered 2022-03-30 (×2): 10 meq via INTRAVENOUS
  Filled 2022-03-30 (×2): qty 100

## 2022-03-30 MED ORDER — CEFAZOLIN SODIUM-DEXTROSE 2-4 GM/100ML-% IV SOLN
2.0000 g | Freq: Three times a day (TID) | INTRAVENOUS | Status: DC
  Administered 2022-03-30 – 2022-03-31 (×4): 2 g via INTRAVENOUS
  Filled 2022-03-30 (×5): qty 100

## 2022-03-30 MED ORDER — DEXTROSE 10 % IV SOLN: INTRAVENOUS | Status: DC

## 2022-03-30 MED ORDER — DEXTROSE 50 % IV SOLN: 25.0000 mL | INTRAVENOUS | Status: AC

## 2022-03-30 NOTE — Progress Notes (Signed)
NAMEIzael [name], MRN:  [id number], DOB:  1930/07/21, LOS: 6 ADMISSION DATE:  04/08/2022, CONSULTATION DATE:  04/01/2022 REFERRING MD:  Dr. Blaine Hamper, CHIEF COMPLAINT:  Altered Mental Status, generalized weakness   Brief Pt Description / Synopsis:  88 y.o. Male admitted with Acute Metabolic Encephalopathy and severe sepsis with Septic shock in setting of UTI & COVID-19 infection.  Also found to have AKI.  History of Present Illness:  [name] is a 88 y.o. male with a past medical history significant for indwelling foley catheter, hypertension, hyperlipidemia, asthma, stroke, depression, severe aortic stenosis, mitral valve regurgitation, CAD, HFpEF, hyperbilirubinemia, with who presents to [facility] ED due to altered mental status and generalized weakness.  Pt remains altered and is unable to contribute to history, therefore history is obtained from chart review.  Per ED and nursing notes, he was sent in from his nursing facility due to altered mental status.  Patient's chronic indwelling Foley catheter was removed, and was unable to be replaced at the facility.  He was noted to have fever and chills, and to be incontinent with malodorous urine.   Please see "significant hospital events" section below for full detailed hospital course.      Significant Hospital Events: Including procedures, antibiotic start and stop dates in addition to other pertinent events   11/8: Admitted by Hospitalist.  Remained hypotensive despite IVF resuscitation. Levophed started, PCCM consulted.  Right IJ CVC and right radial A-line placed 03/25/22- patient is more alert this am but is not appropriate with encephalopathy reaching and pulling on catheters.  He will start PT/OT today.  He is bacteremic with Klebsiella pneumoniae  03/26/22- patient on precedex much less aggitated not pulling on a line or central venous catheter. He's off levophed but still on vaso. He had PT/OT yesterday.  He has palliative care following.   He's on rocephin for bacteramia.  03/27/22- patient remains critically ill, he is on vasopressin, he is with worsening renal failure, and thrombocytopenia suspect due to Sepsis.  Met with daughter and reviewed goals of care , wishes to continue full code scope of care at this time.  03/28/22- patient remains critically ill with septic shock bactermia, met with daughter yesterday and had GOC still remains full code but high risk of loss of life.   11/13 remains encephalopathic 11/14 remain encepahlopathic  Pertinent  Medical History   Past Medical History:  Diagnosis Date   (HFpEF) heart failure with preserved ejection fraction ([place])    a. 03/2018 Echo: EF 60-65%, Gr1 DD.   Aortic atherosclerosis ([place]) 05/2013   Per TEE   Arrhythmia    Asthma    CHF (congestive heart failure) (HCC)    Colon polyp    Coronary artery disease    a. Nonobstructive by 2004 cath b. 05/2013 NSTEMI/Cath: >M 30, LAD 66m LCX 249mRCA 30p, EF 60%, ? Sev AS (mod by TEE).   GERD (gastroesophageal reflux disease)    Heart murmur    Hiatal hernia    Hyperlipidemia    Hypertension    Lymphedema    Mitral regurgitation    a. 03/2018 Echo: Mild to mod MR.   Obesity    Onychomycosis    Osteoarthritis    Recurrent cellulitis of lower extremity    Severe aortic stenosis    a. 2013 Echo: EF 55%, mod AS; b. TEE 05/2013: EF EF 60-65%, mod AS; c.10/2014 Echo: 55-60%, mod AS; d. 10/2016 Echo: EF 65-70%, Mod-Sev AS; e. 03/2018 Ehco: EF 60-65%,  Sev AS, mild to mod AI. Mean grad (S) 8mHg, Valve Area (VTI): 0.92cm^2.     Micro Data:  11/8: SARS-CoV-2 PCR>> Positive 11/8: Blood culture x2>> 11/8: Urine>>  Antimicrobials:   Antibiotics Given (last 72 hours)     Date/Time Action Medication Dose Rate   03/27/22 0857 New Bag/Given   cefTRIAXone (ROCEPHIN) 2 g in sodium chloride 0.9 % 100 mL IVPB 2 g 200 mL/hr   03/28/22 0845 New Bag/Given   cefTRIAXone (ROCEPHIN) 2 g in sodium chloride 0.9 % 100 mL IVPB 2 g 200 mL/hr    03/29/22 0906 New Bag/Given   cefTRIAXone (ROCEPHIN) 2 g in sodium chloride 0.9 % 100 mL IVPB 2 g 200 mL/hr   03/30/22 0837 New Bag/Given   ceFAZolin (ANCEF) IVPB 2g/100 mL premix 2 g 200 mL/hr         EVENTS OVERNIGHT Low BP Off pressors +encephalopathy +COVID infection SEVERE LACTIC ACIDOSIS    Objective   Blood pressure 120/75, pulse (!) 59, temperature 98 F (36.7 C), temperature source Axillary, resp. rate 19, height _0  (1.753 m), weight 110 kg, SpO2 100 %. CVP:  [2 mmHg-23 mmHg] 2 mmHg      Intake/Output Summary (Last 24 hours) at 03/30/2022 0842 Last data filed at 03/30/2022 0700 Gross per 24 hour  Intake 1423.32 ml  Output 881 ml  Net 542.32 ml    Filed Weights   03/26/22 0420 03/29/22 0617 03/30/22 0442  Weight: 107.7 kg 109.4 kg 110 kg     REVIEW OF SYSTEMS  PATIENT IS UNABLE TO PROVIDE COMPLETE REVIEW OF SYSTEMS DUE TO SEVERE CRITICAL ILLNESS   PHYSICAL EXAMINATION:  GENERAL:critically ill appearing,  EYES: Pupils equal, round, reactive to light.  No scleral icterus.  MOUTH: Moist mucosal membrane. NECK: Supple.  PULMONARY: Lungs clear to auscultation, +rhonchi, +wheezing CARDIOVASCULAR: S1 and S2.  Regular rate and rhythm GASTROINTESTINAL: Soft, nontender, -distended. Positive bowel sounds.  MUSCULOSKELETAL: No swelling, clubbing, or edema.  NEUROLOGIC: obtunded,sedated SKIN:normal, warm to touch, Capillary refill delayed  Pulses present bilaterally   Assessment & Plan:   88yo white male admitted with severe septic shock Septic Shock-PRESENT ON ADMISSION - DUE TO KLEBSIELLA BACTEREMIA with COVID 19 infection complicated by severe acidosis and severe metabolic encephalopathy  SEPTIC shock SOURCE-KLEBSIELLA BACTEREMIA(source urine) with COVID 19 infection  -use vasopressors to keep MAP>65 as needed -follow ABG and LA as needed -Diuresis as blood pressure and renal function permits ~currently unable to diurese due to shock   COVID-19  infection Continue IV steroids  Maintain airborne and contact precautions  As needed bronchodilators (MDI) Vitamin C and zinc Antitussives High risk for intubation and death -Continue empiric ABX  ACUTE KIDNEY INJURY/Renal Failure -continue Foley Catheter-assess need -Avoid nephrotoxic agents -Follow urine output, BMP -Ensure adequate renal perfusion, optimize oxygenation -Renal dose medications   Intake/Output Summary (Last 24 hours) at 03/30/2022 0845 Last data filed at 03/30/2022 0700 Gross per 24 hour  Intake 1423.32 ml  Output 881 ml  Net 542.32 ml     Elevated LFT's PMHx: Hyperbilirubinemia CT Abdomen & Pelvis without acute intra-abdominal findings -Trend LFTs  Thrombocytopenia   Suspect sepsis related , ddimer elevated , will order fibronogen , no oozing peripherally   NEUROLOGY ACUTE METABOLIC ENCEPHALOPATHY Consider MRI brain to assess for CVA    Patient is critically ill with severe shock and multiorgan failure.  Prognosis is extremely guarded.  High risk for decompensation, need for intubation, cardiac arrest and death.  Given advanced age  and comorbidities recommend DNR status.  Best Practice (right click and "Reselect all SmartList Selections" daily)   Diet/type: NPO DVT prophylaxis: prophylactic heparin  GI prophylaxis: N/A Lines: N/A Foley:  Yes, and it is still needed Code Status:  full code   Labs   CBC: Recent Labs  Lab 04/08/2022 0845 03/25/22 0355 03/26/22 0359 03/27/22 0500 03/28/22 0512 03/29/22 0414 03/30/22 0428  WBC 13.5*   < > 14.5* 14.1* 10.0 9.1 9.0  NEUTROABS 12.6*  --   --   --   --   --   --   HGB 13.1   < > 11.3* 12.0* 12.0* 11.9* 11.7*  HCT 40.1   < > 34.2* 36.1* 35.9* 36.4* 36.1*  MCV 83.2   < > 82.4 81.9 82.2 82.0 82.4  PLT 154   < > 107* 87* 74* 87* 104*   < > = values in this interval not displayed.     Basic Metabolic Panel: Recent Labs  Lab 03/26/22 0359 03/27/22 0756 03/28/22 0512 03/29/22 0414  03/30/22 0428  NA 143 142 145 145 151*  K 4.6 4.5 4.3 4.1 3.4*  CL 111 114* 117* 114* 119*  CO2 22 20* 21* 22 23  GLUCOSE 175* 154* 122* 102* 75  BUN 76* 95* 98* 94* 92*  CREATININE 2.50* 2.64* 2.33* 2.24* 2.01*  CALCIUM 7.4* 6.9* 7.0* 7.2* 7.3*  MG 2.3 2.5* 2.6* 2.5* 2.4  PHOS 5.7* 5.9* 5.4* 4.7* 3.9    GFR: Estimated Creatinine Clearance: 30.4 mL/min (A) (by C-G formula based on SCr of 2.01 mg/dL (H)). Recent Labs  Lab 04/03/2022 0844 03/18/2022 0845 03/28/2022 1614 03/26/2022 1840 03/25/22 0355 03/26/22 0359 03/27/22 0500 03/28/22 0512 03/29/22 0414 03/29/22 2332 03/30/22 0428  PROCALCITON 13.92  --   --   --  18.50 15.13  --   --   --   --   --   WBC  --    < >  --   --  21.2* 14.5* 14.1* 10.0 9.1  --  9.0  LATICACIDVEN  --    < > 2.4* 2.2* 1.9  --   --   --   --  2.1*  --    < > = values in this interval not displayed.     Liver Function Tests: Recent Labs  Lab 04/14/2022 0845 03/25/22 0355 03/26/22 0359  AST 65* 59* 77*  ALT 29 28 34  ALKPHOS 171* 98 99  BILITOT 3.1* 2.6* 1.7*  PROT 6.1* 5.4* 5.5*  ALBUMIN 2.6* 2.3* 2.2*    No results for input(s): "LIPASE", "AMYLASE" in the last 168 hours. No results for input(s): "AMMONIA" in the last 168 hours.  ABG    Component Value Date/Time   PHART 7.39 03/19/2022 1635   PCO2ART 39 04/04/2022 1635   PO2ART 73 (L) 04/08/2022 1635   HCO3 23.5 03/25/2022 0355   ACIDBASEDEF 1.1 03/25/2022 0355   O2SAT 74.7 03/25/2022 0355     Coagulation Profile: Recent Labs  Lab 04/10/2022 0845  INR 1.6*     Cardiac Enzymes: Recent Labs  Lab 04/08/2022 0844 03/25/22 0355  CKTOTAL 484* 322     HbA1C: Hemoglobin A1C  Date/Time Value Ref Range Status  05/21/2013 04:03 AM 5.9 4.2 - 6.3 % Final    Comment:    The American Diabetes Association recommends that a primary goal of therapy should be <7% and that physicians should reevaluate the treatment regimen in patients with HbA1c values consistently >8%.  Hgb A1c MFr  Bld  Date/Time Value Ref Range Status  02/24/2022 08:52 PM 4.7 (L) 4.8 - 5.6 % Final    Comment:    (NOTE) Pre diabetes:          5.7%-6.4%  Diabetes:              >6.4%  Glycemic control for   <7.0% adults with diabetes   11/28/2019 03:38 PM 5.2 4.6 - 6.5 % Final    Comment:    Glycemic Control Guidelines for People with Diabetes:Non Diabetic:  <6%Goal of Therapy: <7%Additional Action Suggested:  >8%     CBG: Recent Labs  Lab 03/29/22 1912 03/29/22 2349 03/30/22 0041 03/30/22 0808 03/30/22 0836  GLUCAP <10* 61* 98 49* 115*     DVT/GI PRX  assessed I Assessed the need for Labs I Assessed the need for Foley I Assessed the need for Central Venous Line Family Discussion when available I Assessed the need for Mobilization I made an Assessment of medications to be adjusted accordingly Safety Risk assessment completed  CASE DISCUSSED IN MULTIDISCIPLINARY ROUNDS WITH ICU TEAM     Critical Care Time devoted to patient care services described in this note is 35  minutes.    Corrin Parker, M.D.  [facility]  Medical Director [place] Director [facility] Cardio-Pulmonary Department

## 2022-03-30 NOTE — Progress Notes (Signed)
Daily Progress Note   Patient Name: [name]       Date: 03/30/2022 DOB: February 12, 1931  Age: 88 y.o. MRN#: [id number] Attending Physician: Flora Lipps, MD Primary Care Physician: Leone Haven, MD Admit Date: 03/23/2022  Reason for Consultation/Follow-up: Establishing goals of care  Subjective: Notes and labs reviewed.  Spoke with attending.  Attending has talked to patient's daughter and decision has been made for DNR/DNI.  No feeding tube and no dialysis.  Daughter plans to come to bedside in the next few days.  Called to speak with daughter.  She states that since our conversation yesterday she was able to speak to [name] who confirmed that patient would not be happy with his quality of life or need for care.  She recapped her decision on DNR/DNI, no feeding tubes, no dialysis.  She states that she should be here tomorrow or the following Shiel.  We discussed his prognosis.  She discusses the need for his Social Security number in order to speak with the [place] regarding benefits including burial benefits.  Discussed that I would follow-up upon her arrival.  Recapped her ability to have an E link visit into her father's room and that multiple people can be on the E link at one time.  Length of Stay: 6  Current Medications: Scheduled Meds:   Chlorhexidine Gluconate Cloth  6 each Topical Daily    Continuous Infusions:   ceFAZolin (ANCEF) IV 2 g (03/30/22 0837)   lactated ringers      PRN Meds: acetaminophen, albuterol, dextromethorphan-guaiFENesin, dextrose, mouth rinse  Physical Exam Constitutional:      Comments: Eyes closed  Pulmonary:     Effort: Pulmonary effort is normal.             Vital Signs: BP (!) 112/40   Pulse 67   Temp 98 F (36.7 C) (Axillary)    Resp 20   Ht 5\' 9"  (1.753 m)   Wt 110 kg   SpO2 100%   BMI 35.81 kg/m  SpO2: SpO2: 100 % O2 Device: O2 Device: Room Air O2 Flow Rate: O2 Flow Rate (L/min): 3 L/min  Intake/output summary:  Intake/Output Summary (Last 24 hours) at 03/30/2022 1308 Last data filed at 03/30/2022 1200 Gross per 24 hour  Intake 23.32 ml  Output 1260 ml  Net -1236.68 ml   LBM: Last BM Date : 03/27/22 Baseline Weight: Weight: 106.6 kg Most recent weight: Weight: 110 kg    Patient Active Problem List   Diagnosis Date Noted   Severe sepsis with septic shock (CODE) (HCC) 04-23-22   Complicated UTI (urinary tract infection) 2022/04/23   COVID-19 virus infection April 23, 2022   Hypoglycemia 04/23/22   Acute metabolic encephalopathy April 23, 2022   Depression Apr 23, 2022   Severe sepsis with septic shock (HCC) 04/23/2022   Aneurysm (HCC) 02/27/2022   Pressure injury of skin 02/26/2022   Acute lacunar stroke (HCC) 02/26/2022   Sepsis (HCC) 02/25/2022   Acute kidney injury (HCC) 02/24/2022   Rhabdomyolysis 02/24/2022   Generalized weakness 02/24/2022   Somnolence 02/24/2022   Sinus arrhythmia 02/24/2022   (HFpEF) heart failure with preserved ejection fraction (HCC) 02/24/2022   Fall 02/24/2022   Right leg swelling 12/03/2019   Hyperbilirubinemia 12/03/2019   Cellulitis 03/17/2018   Acute on chronic heart failure (HCC) 10/19/2017   Poor social situation 09/06/2017   Chronic diastolic heart failure (HCC)    Venous ulcer (HCC) 07/08/2017   Osteoarthritis 05/28/2017   Varicose veins of both lower extremities with pain 04/10/2017   Iron deficiency anemia 11/25/2016   Chronic venous insufficiency 10/04/2016   Lymphedema 06/08/2016   Morbid obesity (HCC) 03/26/2015   Coronary artery disease, non-occlusive 06/05/2013   Hypertension 06/05/2013   Hyperlipidemia 02/23/2012   Diabetes mellitus (HCC) 02/23/2012   Aortic valve stenosis, severe 02/23/2012    Palliative Care Assessment & Plan    Recommendations/Plan: DNR/DNI.  No feeding tubes.  No dialysis.   Daughter states she will be here tomorrow or the following [place].    Code Status:    Code Status Orders  (From admission, onward)           Start     Ordered   03/30/22 1120  Do not attempt resuscitation (DNR)  Continuous       Question Answer Comment  In the event of cardiac or respiratory ARREST Do not call a "code blue"   In the event of cardiac or respiratory ARREST Do not perform Intubation, CPR, defibrillation or ACLS   In the event of cardiac or respiratory ARREST Use medication by any route, position, wound care, and other measures to relive pain and suffering. May use oxygen, suction and manual treatment of airway obstruction as needed for comfort.      03/30/22 1119           Code Status History     Date Active Date Inactive Code Status Order ID Comments User Context   23-Apr-2022 1157 03/30/2022 1119 Full Code [id number]  Lorretta Harp, MD ED   03/01/2022 1129 03/03/2022 0227 DNR [id number]  Canary Brim, NP Inpatient   02/24/2022 1954 03/01/2022 1129 Full Code [id number]  Verdene Lennert, MD ED   03/17/2018 1354 03/20/2018 2228 Full Code [id number]  Alford Highland, MD ED   09/27/2017 0734 09/29/2017 2034 Full Code [id number]  Marcelo Baldy, MD ED   08/07/2017 0428 08/10/2017 1359 Full Code [id number]  Oralia Manis, MD Inpatient   05/13/2016 0435 05/13/2016 1813 Full Code [id number]  Arnaldo Natal Inpatient   06/21/2015 0533 06/21/2015 1553 Full Code [id number]  Ihor Austin, MD ED       Prognosis: Poor   Care plan was discussed with attending/TOC/RN.  Spoke with person covering ethics pager who also is currently the [facility] for this patient. Discussed stepdaughter, daughter, and family dynamics.  There are  no known forms of H POA, and stepdaughter denied any H POA papers.  Will work with patient's daughter, and she can update stepdaughter as she sees fit.  Thank you for allowing the Palliative  Medicine Team to assist in the care of this patient.    Asencion Gowda, NP  Please contact Palliative Medicine Team phone at [phone] for questions and concerns.

## 2022-03-30 NOTE — Progress Notes (Signed)
Hypoglycemic Event  CBG: 40  Treatment: D50 50 mL (25 gm)  Symptoms: None  Follow-up CBG: Time:2050 CBG Result:115  Possible Reasons for Event: Inadequate meal intake  Comments/MD notified:NP Delton See gave order for IVF with dextrose    Eloisa Northern

## 2022-03-30 NOTE — Progress Notes (Signed)
PT Cancellation Note  Patient Details Name: [name] MRN: [id number] DOB: 1930-10-16   Cancelled Treatment:    Reason Eval/Treat Not Completed:  (Chart reviewed and nurse consulted. Patient remains grossly lethargic and unable to follow commands, unable to meaningfully participate with skilled intervention. Per guidelines, will complete initial order until patient medically improved and able to participate.  Please re-consult as medically appropriate and in alignment with goals of care.)  [name] H. Manson Passey, PT, DPT, NCS 03/30/22, 9:06 AM [phone]

## 2022-03-30 NOTE — Progress Notes (Signed)
OT Cancellation Note  Patient Details Name: [name] MRN: [id number] DOB: 21-Nov-1930   Cancelled Treatment:    Reason Eval/Treat Not Completed: Patient not medically ready. Per chart review and secure chat with nurse, pt remains grossly lethargic and unable to follow commands, unable to meaningfully participate with skilled intervention. Per guidelines, will complete initial order until patient medically improved and able to participate.  Please re-consult as medically appropriate and in alignment with goals of care.    Jackquline Denmark, MS, OTR/L , CBIS ascom [phone]  03/30/22, 9:41 AM

## 2022-03-30 NOTE — IPAL (Signed)
  Interdisciplinary Goals of Care Family Meeting   Date carried out: 03/30/2022  Location of the meeting: Phone conference  Member's involved: Physician, Bedside Registered Nurse, Social Worker, and Family Member or next of kin     GOALS OF CARE DISCUSSION  The Clinical status was relayed to family in detail-Daughter Darrel Reach  [phone]   Updated and notified of patients medical condition-  Explained to family course of therapy and the modalities   Patient with Progressive multiorgan failure with a very high probablity of a very minimal chance of meaningful recovery despite all aggressive and optimal medical therapy.    Daughter understands the situation.  Patient would NOT want CPR He would NOT want ventilator He hates doctors and hospitals and also would NOT want Hemodialysis and would NOT want   She has consented and agreed to DNR/DNI  status   Family are satisfied with Plan of action and management. All questions answered  Additional CC time 37 mins   Thorsten Climer Santiago Glad, M.D.  [facility]  Medical Director [facility] Medical Director [facility] Cardio-Pulmonary Department

## 2022-03-30 NOTE — Progress Notes (Signed)
PHARMACY CONSULT NOTE   Pharmacy Consult for Electrolyte Monitoring and Replacement   Recent Labs: Potassium (mmol/L)  Date Value  03/30/2022 3.4 (L)  05/26/2014 3.6   Magnesium (mg/dL)  Date Value  86/76/1950 2.4   Calcium (mg/dL)  Date Value  93/26/7124 7.3 (L)   Calcium, Total (mg/dL)  Date Value  58/01/9832 8.8   Albumin (g/dL)  Date Value  82/50/5397 2.2 (L)  04/02/2020 3.7  05/26/2014 3.3 (L)   Phosphorus (mg/dL)  Date Value  67/34/1937 3.9   Sodium (mmol/L)  Date Value  03/30/2022 151 (H)  06/20/2020 143  05/26/2014 141     Assessment: 88 y.o. male w/ PMH of indwelling foley catheter, hypertension, hyperlipidemia, asthma, stroke, depression, severe aortic stenosis, mitral valve regurgitation, CAD, dCHF, hyperbilirubinemia admitted with Acute Metabolic Encephalopathy and severe sepsis with Septic shock in setting of UTI & COVID-19 infection    Goal of Therapy:  Electrolytes WNL  Plan:  ---10 mEq IV KCl x 2 ---recheck electrolytes in am  Lowella Bandy ,PharmD Clinical Pharmacist 03/30/2022 7:08 AM

## 2022-03-30 NOTE — Progress Notes (Addendum)
Normal Labat for this patient. He screams when you touch him and mumbles incomprehensible words. Treated first thing for low blood sugar with 1 amp of D 50 50 ml IVP per orders. Patient rebounded back to CBG of 115. Traeted again for low blood sugar at 1553 . Given 1 amp of D 50 50 mls. per order.  Rebounded again to CBG of 128. Mouth care given every 2 hours and prn.  Patient made DNR/DNI per his biological daughter not his step daughter after speaking with Dr.Kasa.  Patient given 2 runs of IV potassium for K+ of 3.4. No recheck ordered for today.

## 2022-03-30 NOTE — TOC Initial Note (Signed)
Transition of Care [facility]) - Initial/Assessment Note    Patient Details  Name: [name] MRN: [id number] Date of Birth: 1930-08-31  Transition of Care [facility]) CM/SW Contact:    Allayne Butcher, RN Phone Number: 03/30/2022, 4:42 PM  Clinical Narrative:                 Patient admitted to the hospital with sepsis and COVID 19.  Patient came in from [facility].  Before he went to [facility] for short term rehab he was living with his step daughter.    Patient's daughter, Darrel Reach lives in [place], she reports that they would talk on the phone but haven't seen each other in years.  She is probably coming up to see patient tomorrow.  After talking with the ICU MD she did make the patient a DNR, she says he does not like hospitals and would not want to be on a vent or have a feeding tube. She had questions about VA benefits as her father is a veteran and 100% disabled, she called and they told her that she needed his DOD ID number to get any benefits started.  Copy of Military ID on file, gave the daughter the DOD number so she can reach back out to the [place].    Expected Discharge Plan: Skilled Nursing Facility Barriers to Discharge: Continued Medical Work up   Patient Goals and CMS Choice Patient states their goals for this hospitalization and ongoing recovery are:: long term care if he survives hospitalization CMS Medicare.gov Compare Post Acute Care list provided to:: Patient Represenative (must comment) Choice offered to / list presented to : Adult Children  Expected Discharge Plan and Services Expected Discharge Plan: Skilled Nursing Facility   Discharge Planning Services: CM Consult Post Acute Care Choice: Skilled Nursing Facility Living arrangements for the past 2 months: Single Family Home                 DME Arranged: N/A DME Agency: NA       HH Arranged: NA HH Agency: NA        Prior Living Arrangements/Services Living arrangements for the past 2 months: Single Family  Home Lives with:: Relatives Patient language and need for interpreter reviewed:: Yes        Need for Family Participation in Patient Care: Yes (Comment) Care giver support system in place?: Yes (comment)   Criminal Activity/Legal Involvement Pertinent to Current Situation/Hospitalization: No - Comment as needed  Activities of Daily Living Home Assistive Devices/Equipment: Hoyer Lift ADL Screening (condition at time of admission) Patient's cognitive ability adequate to safely complete daily activities?: No Is the patient deaf or have difficulty hearing?: No Does the patient have difficulty seeing, even when wearing glasses/contacts?: No Does the patient have difficulty concentrating, remembering, or making decisions?: Yes Patient able to express need for assistance with ADLs?: Yes Does the patient have difficulty dressing or bathing?: Yes Independently performs ADLs?: No Communication: Independent Dressing (OT): Dependent Is this a change from baseline?: Pre-admission baseline Grooming: Dependent Is this a change from baseline?: Pre-admission baseline Feeding: Needs assistance Is this a change from baseline?: Pre-admission baseline Bathing: Dependent Is this a change from baseline?: Pre-admission baseline Toileting: Dependent Is this a change from baseline?: Pre-admission baseline In/Out Bed: Dependent Is this a change from baseline?: Pre-admission baseline Walks in Home: Dependent Is this a change from baseline?: Pre-admission baseline Does the patient have difficulty walking or climbing stairs?: Yes Weakness of Legs: Both Weakness of Arms/Hands: Both  Permission Sought/Granted      Share Information with NAME: [name]     Permission granted to share info w Relationship: daughter  Permission granted to share info w Contact Information: [phone]  Emotional Assessment Appearance:: Appears stated age Attitude/Demeanor/Rapport: Unable to Assess Affect (typically  observed): Unable to Assess Orientation: : Fluctuating Orientation (Suspected and/or reported Sundowners) Alcohol / Substance Use: Not Applicable Psych Involvement: No (comment)  Admission diagnosis:  Cystitis [N30.90] Severe sepsis (HCC) [A41.9, R65.20] Acute metabolic encephalopathy [G93.41] Severe sepsis with septic shock (HCC) [A41.9, R65.21] Patient Active Problem List   Diagnosis Date Noted   Severe sepsis with septic shock (CODE) (HCC) 04/07/2022   Complicated UTI (urinary tract infection) 04/15/2022   COVID-19 virus infection 04/10/2022   Hypoglycemia 03/27/2022   Acute metabolic encephalopathy 03/30/2022   Depression 03/23/2022   Severe sepsis with septic shock (HCC) 03/26/2022   Aneurysm (HCC) 02/27/2022   Pressure injury of skin 02/26/2022   Acute lacunar stroke (HCC) 02/26/2022   Sepsis (HCC) 02/25/2022   Acute kidney injury (HCC) 02/24/2022   Rhabdomyolysis 02/24/2022   Generalized weakness 02/24/2022   Somnolence 02/24/2022   Sinus arrhythmia 02/24/2022   (HFpEF) heart failure with preserved ejection fraction (HCC) 02/24/2022   Fall 02/24/2022   Right leg swelling 12/03/2019   Hyperbilirubinemia 12/03/2019   Cellulitis 03/17/2018   Acute on chronic heart failure (HCC) 10/19/2017   Poor social situation 09/06/2017   Chronic diastolic heart failure (HCC)    Venous ulcer (HCC) 07/08/2017   Osteoarthritis 05/28/2017   Varicose veins of both lower extremities with pain 04/10/2017   Iron deficiency anemia 11/25/2016   Chronic venous insufficiency 10/04/2016   Lymphedema 06/08/2016   Morbid obesity (HCC) 03/26/2015   Coronary artery disease, non-occlusive 06/05/2013   Hypertension 06/05/2013   Hyperlipidemia 02/23/2012   Diabetes mellitus (HCC) 02/23/2012   Aortic valve stenosis, severe 02/23/2012   PCP:  Glori Luis, MD Pharmacy:   [facility] - [place], [place] - [address] [place]  [address] Phone: [phone] Fax: [phone]  [id number] [place], [place] - [address] AT [facility] [address] [place] [address] Phone: [phone] Fax: [phone]     Social Determinants of Health (SDOH) Interventions    Readmission Risk Interventions     No data to display

## 2022-03-31 DIAGNOSIS — Z7189 Other specified counseling: Secondary | ICD-10-CM | POA: Diagnosis not present

## 2022-03-31 DIAGNOSIS — R6521 Severe sepsis with septic shock: Secondary | ICD-10-CM | POA: Diagnosis not present

## 2022-03-31 LAB — CBC
HCT: 38.6 % — ABNORMAL LOW (ref 39.0–52.0)
Hemoglobin: 12.5 g/dL — ABNORMAL LOW (ref 13.0–17.0)
MCH: 26.7 pg (ref 26.0–34.0)
MCHC: 32.4 g/dL (ref 30.0–36.0)
MCV: 82.5 fL (ref 80.0–100.0)
Platelets: 99 10*3/uL — ABNORMAL LOW (ref 150–400)
RBC: 4.68 MIL/uL (ref 4.22–5.81)
RDW: 17.2 % — ABNORMAL HIGH (ref 11.5–15.5)
WBC: 9.6 10*3/uL (ref 4.0–10.5)
nRBC: 0.5 % — ABNORMAL HIGH (ref 0.0–0.2)

## 2022-03-31 LAB — GLUCOSE, CAPILLARY
Glucose-Capillary: 128 mg/dL — ABNORMAL HIGH (ref 70–99)
Glucose-Capillary: 55 mg/dL — ABNORMAL LOW (ref 70–99)
Glucose-Capillary: 72 mg/dL (ref 70–99)
Glucose-Capillary: 86 mg/dL (ref 70–99)
Glucose-Capillary: 92 mg/dL (ref 70–99)
Glucose-Capillary: 95 mg/dL (ref 70–99)

## 2022-03-31 LAB — BASIC METABOLIC PANEL
Anion gap: 6 (ref 5–15)
BUN: 69 mg/dL — ABNORMAL HIGH (ref 8–23)
CO2: 25 mmol/L (ref 22–32)
Calcium: 7.6 mg/dL — ABNORMAL LOW (ref 8.9–10.3)
Chloride: 121 mmol/L — ABNORMAL HIGH (ref 98–111)
Creatinine, Ser: 1.7 mg/dL — ABNORMAL HIGH (ref 0.61–1.24)
GFR, Estimated: 38 mL/min — ABNORMAL LOW (ref >60)
Glucose, Bld: 121 mg/dL — ABNORMAL HIGH (ref 70–99)
Potassium: 3 mmol/L — ABNORMAL LOW (ref 3.5–5.1)
Sodium: 152 mmol/L — ABNORMAL HIGH (ref 135–145)

## 2022-03-31 LAB — PHOSPHORUS: Phosphorus: 2.5 mg/dL (ref 2.5–4.6)

## 2022-03-31 LAB — MAGNESIUM: Magnesium: 2.4 mg/dL (ref 1.7–2.4)

## 2022-03-31 MED ORDER — DEXTROSE-NACL 5-0.9 % IV SOLN: INTRAVENOUS | Status: DC

## 2022-03-31 MED ORDER — MORPHINE SULFATE (PF) 2 MG/ML IV SOLN
1.0000 mg | Freq: Four times a day (QID) | INTRAVENOUS | Status: DC | PRN
  Administered 2022-03-31: 1 mg via INTRAVENOUS
  Filled 2022-03-31: qty 1

## 2022-03-31 MED ORDER — LORAZEPAM 2 MG/ML IJ SOLN
2.0000 mg | Freq: Once | INTRAMUSCULAR | Status: AC
  Administered 2022-03-31: 2 mg via INTRAVENOUS
  Filled 2022-03-31: qty 1

## 2022-03-31 MED ORDER — ENOXAPARIN SODIUM 60 MG/0.6ML IJ SOSY
0.5000 mg/kg | PREFILLED_SYRINGE | INTRAMUSCULAR | Status: DC
  Administered 2022-03-31: 52.5 mg via SUBCUTANEOUS
  Filled 2022-03-31: qty 0.6

## 2022-03-31 MED ORDER — POTASSIUM CHLORIDE 10 MEQ/100ML IV SOLN
10.0000 meq | INTRAVENOUS | Status: AC
  Administered 2022-03-31 (×4): 10 meq via INTRAVENOUS
  Filled 2022-03-31 (×4): qty 100

## 2022-03-31 NOTE — Progress Notes (Signed)
PHARMACIST - PHYSICIAN COMMUNICATION  CONCERNING:  Enoxaparin (Lovenox) for DVT Prophylaxis    RECOMMENDATION: Patient was prescribed enoxaparin 40mg  q24 hours for VTE prophylaxis.   Filed Weights   03/29/22 0617 03/30/22 0442 03/31/22 0229  Weight: 109.4 kg (241 lb 2.9 oz) 110 kg (242 lb 8.1 oz) 105.5 kg (232 lb 9.4 oz)    Body mass index is 34.35 kg/m.  Estimated Creatinine Clearance: 35.3 mL/min (A) (by C-G formula based on SCr of 1.7 mg/dL (H)).   Based on [facility] policy patient is candidate for enoxaparin 0.5mg /kg TBW SQ every 24 hours based on BMI being >30.  DESCRIPTION: Pharmacy has adjusted enoxaparin dose per [facility] policy.  Patient is now receiving enoxaparin 52.5 mg every 24 hours   [facility] 03/31/2022 2:23 PM

## 2022-03-31 NOTE — Evaluation (Signed)
Clinical/Bedside Swallow Evaluation Patient Details  Name: Billal Rollo MRN: [id number] Date of Birth: 01/22/1931  Today's Date: 03/31/2022 Time: SLP Start Time (ACUTE ONLY): 1345 SLP Stop Time (ACUTE ONLY): 1430 SLP Time Calculation (min) (ACUTE ONLY): 45 min  Past Medical History:  Past Medical History:  Diagnosis Date   (HFpEF) heart failure with preserved ejection fraction (HCC)    a. 03/2018 Echo: EF 60-65%, Gr1 DD.   Aortic atherosclerosis (HCC) 05/2013   Per TEE   Arrhythmia    Asthma    CHF (congestive heart failure) (HCC)    Colon polyp    Coronary artery disease    a. Nonobstructive by 2004 cath b. 05/2013 NSTEMI/Cath: >M 30, LAD 99m, LCX 57m, RCA 30p, EF 60%, ? Sev AS (mod by TEE).   GERD (gastroesophageal reflux disease)    Heart murmur    Hiatal hernia    Hyperlipidemia    Hypertension    Lymphedema    Mitral regurgitation    a. 03/2018 Echo: Mild to mod MR.   Obesity    Onychomycosis    Osteoarthritis    Recurrent cellulitis of lower extremity    Severe aortic stenosis    a. 2013 Echo: EF 55%, mod AS; b. TEE 05/2013: EF EF 60-65%, mod AS; c.10/2014 Echo: 55-60%, mod AS; d. 10/2016 Echo: EF 65-70%, Mod-Sev AS; e. 03/2018 Ehco: EF 60-65%, Sev AS, mild to mod AI. Mean grad (S) , Valve Area (VTI): 0.92cm^2.   Past Surgical History:  Past Surgical History:  Procedure Laterality Date   CARDIAC CATHETERIZATION  11/2002   CARDIAC CATHETERIZATION  05/21/2013   showing 30% oLM, 30% D1, 20% mLCx, 30% pRCA; EF 60%, severe AS (mean gradient 28 mmHg, peak 26, area 0.9)   CHOLECYSTECTOMY  2001   TRANSESOPHAGEAL ECHOCARDIOGRAM  05/22/2013   Preserved EF, moderate AS (valve area by planimetry between 1.0-1.26 cm sq), moderate aortic arch and descending aortic atherosclerosis.    TRANSTHORACIC ECHOCARDIOGRAM  05/19/2013   EF 60-65%, impaired diastolic function, mild LA dilatation, mild MR/AI/TR, mod AS, high normal RVSP (30.4 mmHg)   HPI:  Pt is a 88 y.o. male with a past medical  history significant for cognitive decline per MD note, indwelling foley catheter, hypertension, hyperlipidemia, asthma, stroke, depression, severe aortic stenosis, mitral valve regurgitation, CAD, HFpEF, hyperbilirubinemia, GERD, Obesity, lymphadema who presents to [facility] ED due to altered mental status and generalized weakness.   Patient's chronic indwelling Foley catheter was removed, and was unable to be replaced at the facility.  He was noted to have fever and chills, and to be incontinent with malodorous urine.  Pt was dx'd w/ Septic shock secondary to Klebsiella sepsis/UTI and COVID-19 infection; AKI at admit.  CXR on 27-Mar-2022: Progressive vascular congestion with mild pulmonary edema.  Elevated right hemidiaphragm with right lower lobe atelectasis  unchanged. No pleural effusion.    Assessment / Plan / Recommendation  Clinical Impression   Pt seen for BSE today. Pt awake, muttered/mumbled speech often incoherent. Was oriented to first name but did not follow commands consistently given MOD cues.  On RA; afebrile. WBC wnl.   OF NOTE: Palliative Care is meeting w/ family tomorrow to determine GOC moving forward in setting of pt's illness/decline of status. Abbreviated BSE performed today pending outcome of GOC meeting w/ family/Palliative Care tomorrow.  Pt appears to present w/ oropharyngeal phase dysphagia in setting of Illness; noted recent CXR results indicating "elevated right hemidiaphragm with right lower lobe atelectasis, unchanged". There is suspected decline  in Cognitive status; potential neuromuscular impact also. ANY Cognitive decline can impact overall awareness/engagement during po tasks which can impact Timing of pharyngeal swallowing which increases risk for aspiration, pulmonary decline.  Pt is at increased risk for aspiration/aspiration pneumonia as well as inability to meet nutrition needs orally, safely.  Pt required MOD tactile/verbal/visual cues for orientation to bolus  presentation, follow through w/ tasks, and self-feeding support. He often muttered/mumbled w/ ice chip held orally demonstrating decreased attention to task of bolus management and swallowing. Pt consumed trials of single ice chips (no other trials given this assessment) w/ no immediate, overt clinical s/s of aspiration noted; no cough and no decline in respiratory presentation noted during/post trials. In setting of decreased attention to task, and baseline Cognitive status, suspect potential delayed pharyngeal swallowin initiation -- this could easily have impact on safety of pharyngeal swallow w/ all oral intake. Oral phase was c/b slow, deliberate bolus management and oral clearing of all boluses given.  OM exam was cursory d/t pt's reduced follow through, but revealed generalized oral weakness.   Recommend continue NPO status w/ Pleasure single ice chips w/ NSG supervision; post oral care and w/ general aspiration precautions. Feeding support and supervision. Reduce Distractions and talking during oral intake. Stop if increased coughing noted. NSG/MD updated; MD agreed. ST services will f/u w/ pt's status next 1-2 days.  SLP Visit Diagnosis: Dysphagia, oropharyngeal phase (R13.12) (declined cognitive status; acute illness)    Aspiration Risk  Moderate aspiration risk;Risk for inadequate nutrition/hydration    Diet Recommendation   continue NPO status w/ Pleasure single ice chips w/ NSG supervision; post oral care and w/ general aspiration precautions. Feeding support and supervision. Reduce Distractions and talking during oral intake. Stop if increased coughing noted. Medication Administration: Via alternative means    Other  Recommendations Recommended Consults:  (Palliative Care following) Oral Care Recommendations: Oral care BID;Oral care prior to ice chip/H20;Staff/trained caregiver to provide oral care Other Recommendations:  (TBD)    Recommendations for follow up therapy are one component  of a multi-disciplinary discharge planning process, led by the attending physician.  Recommendations may be updated based on patient status, additional functional criteria and insurance authorization.  Follow up Recommendations Follow physician's recommendations for discharge plan and follow up therapies (TBD)      Assistance Recommended at Discharge  TBD  Functional Status Assessment Patient has had a recent decline in their functional status and/or demonstrates limited ability to make significant improvements in function in a reasonable and predictable amount of time  Frequency and Duration min 2x/week  1 week       Prognosis Prognosis for Safe Diet Advancement: Guarded Barriers to Reach Goals: Cognitive deficits;Language deficits;Time post onset;Severity of deficits Barriers/Prognosis Comment: acute illness      Swallow Study   General Date of Onset: 2022/03/27 HPI: Pt is a 88 y.o. male with a past medical history significant for indwelling foley catheter, hypertension, hyperlipidemia, asthma, stroke, depression, severe aortic stenosis, mitral valve regurgitation, CAD, HFpEF, hyperbilirubinemia, with who presents to [facility] ED due to altered mental status and generalized weakness.   Patient's chronic indwelling Foley catheter was removed, and was unable to be replaced at the facility.  He was noted to have fever and chills, and to be incontinent with malodorous urine.  Pt was dx'd w/ Septic shock secondary to Klebsiella sepsis/UTI and COVID-19 infection; AKI at admit.  CXR on 27-Mar-2022: Progressive vascular congestion with mild pulmonary edema.  Elevated right hemidiaphragm with right lower lobe  atelectasis  unchanged. No pleural effusion. Type of Study: Bedside Swallow Evaluation Previous Swallow Assessment: none Diet Prior to this Study: NPO Temperature Spikes Noted: No (wbc not elevated) Respiratory Status: Room air History of Recent Intubation: No Behavior/Cognition:  Alert;Cooperative;Pleasant mood;Confused;Distractible;Requires cueing;Doesn't follow directions Oral Cavity Assessment: Excessive secretions;Dry Oral Care Completed by SLP: Yes Oral Cavity - Dentition: Edentulous Vision:  (n/a) Self-Feeding Abilities: Total assist Patient Positioning: Upright in bed (needed positioning) Baseline Vocal Quality: Low vocal intensity (muttered speech) Volitional Cough: Cognitively unable to elicit Volitional Swallow: Unable to elicit    Oral/Motor/Sensory Function Overall Oral Motor/Sensory Function: Generalized oral weakness   Ice Chips Ice chips: Within functional limits Presentation: Spoon (fed; 10 trials)   Thin Liquid Thin Liquid: Not tested    Nectar Thick Nectar Thick Liquid: Not tested   Honey Thick Honey Thick Liquid: Not tested   Puree Puree: Not tested   Solid     Solid: Not tested       Jerilynn Som, MS, CCC-SLP Speech Language Pathologist Rehab Services; [facility] [phone] (ascom) Diamante Rubin 03/31/2022,5:06 PM

## 2022-03-31 NOTE — Progress Notes (Signed)
Telephone report called to [employer].  Patient taken to room 107 via bed. Mask in place for transport.

## 2022-03-31 NOTE — Progress Notes (Signed)
PHARMACY CONSULT NOTE   Pharmacy Consult for Electrolyte Monitoring and Replacement   Recent Labs: Potassium (mmol/L)  Date Value  03/31/2022 3.0 (L)  05/26/2014 3.6   Magnesium (mg/dL)  Date Value  44/81/8563 2.4   Calcium (mg/dL)  Date Value  14/97/0263 7.6 (L)   Calcium, Total (mg/dL)  Date Value  78/58/8502 8.8   Albumin (g/dL)  Date Value  77/41/2878 2.2 (L)  04/02/2020 3.7  05/26/2014 3.3 (L)   Phosphorus (mg/dL)  Date Value  67/67/2094 2.5   Sodium (mmol/L)  Date Value  03/31/2022 152 (H)  06/20/2020 143  05/26/2014 141     Assessment: 88 y.o. male w/ PMH of indwelling foley catheter, hypertension, hyperlipidemia, asthma, stroke, depression, severe aortic stenosis, mitral valve regurgitation, CAD, dCHF, hyperbilirubinemia admitted with Acute Metabolic Encephalopathy and severe sepsis with Septic shock in setting of UTI & COVID-19 infection    MIVF: dextrose 10 % at 75 mL/hr  Goal of Therapy:  Electrolytes WNL  Plan:  ---10 mEq IV KCl x 4 per NP ---recheck electrolytes in am  Lowella Bandy ,PharmD Clinical Pharmacist 03/31/2022 7:14 AM

## 2022-03-31 NOTE — Progress Notes (Signed)
Hypoglycemic Event  CBG: 51  Treatment: D50 50 mL (25 gm)  Symptoms: None  Follow-up CBG: Time:0017 CBG Result:128   Possible Reasons for Event: Inadequate meal intake  Comments/MD notified:n/a    Eloisa Northern

## 2022-03-31 NOTE — Progress Notes (Signed)
Patient alert and able to engage in short conversation. Patient's speech garbled, but able to be understood if reminded to speak intentionally and slowly. Patient expresses being in pain and his dislike of all the "medical stuff." Patient resistant to being placed on O2 via [place], but agreeable after being told his daughter would hopefully be coming to see him. When asked what he thought about that, he replied "I'm not sure," with tears in his eyes. Patient asked if she was "driving here." Patient able to verbalize having been in the [employer]. Patient repositioned and attempts made to make him as comfortable as possible.

## 2022-03-31 NOTE — Progress Notes (Signed)
Assisted tele visit to patient with daughter.  Beretta Ginsberg Anderson, RN   

## 2022-03-31 NOTE — Progress Notes (Signed)
[facility]  - [place] at [facility]   PATIENT NAME: [name]    MR#:  [id number]  DATE OF BIRTH:  07-Mar-1931  SUBJECTIVE:   Patient has mild cognitive decline and unable to answer some of the basic question. Requesting ice water. No family at bedside. Daughter is coming from out of town tomorrow for inflammation   VITALS:  Blood pressure (!) 147/41, pulse 97, temperature (!) 97 F (36.1 C), temperature source Axillary, resp. rate 18, height 5\' 9"  (1.753 m), weight 105.5 kg, SpO2 91 %.  PHYSICAL EXAMINATION:  limited due to patient participation. Chronically ill. Obese GENERAL:  88 y.o.-year-old patient lying in the bed with no acute distress.  LUNGS: Normal breath sounds bilaterally, no wheezing CARDIOVASCULAR: S1, S2 normal. No murmurs,   ABDOMEN: Soft, nontender, nondistended. Bowel sounds present.  EXTREMITIES: +  edema b/l.    NEUROLOGIC: nonfocal  patient is alert and awake   LABORATORY PANEL:  CBC Recent Labs  Lab 03/31/22 0221  WBC 9.6  HGB 12.5*  HCT 38.6*  PLT 99*    Chemistries  Recent Labs  Lab 03/26/22 0359 03/27/22 0756 03/31/22 0221  NA 143   < > 152*  K 4.6   < > 3.0*  CL 111   < > 121*  CO2 22   < > 25  GLUCOSE 175*   < > 121*  BUN 76*   < > 69*  CREATININE 2.50*   < > 1.70*  CALCIUM 7.4*   < > 7.6*  MG 2.3   < > 2.4  AST 77*  --   --   ALT 34  --   --   ALKPHOS 99  --   --   BILITOT 1.7*  --   --    < > = values in this interval not displayed.    Assessment and Plan [name] is a 88 y.o. male with a past medical history significant for indwelling foley catheter, hypertension, hyperlipidemia, asthma, stroke, depression, severe aortic stenosis, mitral valve regurgitation, CAD, HFpEF, hyperbilirubinemia, with who presents to [facility] ED due to altered mental status and generalized weakness.  Patient's chronic indwelling Foley catheter was removed, and was unable to be replaced at the facility.  He was noted to have fever and  chills, and to be incontinent with malodorous urine.   Septic shock secondary to Klebsiella sepsis/UTI and COVID-19 infection -- sepsis now improved -- patient completed seven days of IV antibiotic -- afebrile -- blood cultures were positive for Klebsiella  COVID-19 infection -- no respiratory distress -- received IV steroids -- PRN nebs  Acute kidney injury in the setting of sepsis/ATN chronic Foley catheter -- creatinine improving down to 1.7 -- came in with creatinine of 4.4 -- baseline creatinine 0.9 -- good urine output  Hypoglycemia poor PO intake/NPO status -- PRN IV dextrose drip  Hypernatremia -- continued D5 water at 75 mL  Nutrition -- speech therapy to see patient  Hyperlipidemia -- resume meds when able to take orally  Patient has multiple comorbidities and overall has a poor prognosis. Palliative care following patient. Appreciate their input. Will await daughter to visit tomorrow and discuss overall health condition. Per notes daughter is leading towards comfort care/ hospice which is reasonable given multiple comorbidities and overall poor prognosis long-term.  Family communication :none today. Consults : CODE STATUS: DNR/DNI DVT Prophylaxis :lovenox Level of care: Telemetry Medical Status is: Inpatient Remains inpatient appropriate because: awaiting for daughter to visit and  discuss overall prognosis and discharge plan    TOTAL TIME TAKING CARE OF THIS PATIENT: 35 minutes.  >50% time spent on counselling and coordination of care  Note: This dictation was prepared with Dragon dictation along with smaller phrase technology. Any transcriptional errors that result from this process are unintentional.  [name] M.D    [facility]   CC: Primary care physician; Glori Luis, MD

## 2022-03-31 NOTE — Progress Notes (Signed)
Daily Progress Note   Patient Name: [name]       Date: 03/31/2022 DOB: July 10, 1930  Age: 88 y.o. MRN#: [id number] Attending Physician: Enedina Finner, MD Primary Care Physician: Glori Luis, MD Admit Date: 04/09/2022  Reason for Consultation/Follow-up: Establishing goals of care  Subjective: Notes and labs reviewed.  Epic chat received from nurse, and attending.  Patient is out of ICU, and is resting in bed.  Called to speak with his daughter Darrel Reach.  [name] states that she will be leaving to come here during the night, and should be here early tomorrow morning.  Discussed updates.  She states that she understands his very poor prognosis.  She states she has been trying to get the information needed to speak with the [facility] regarding [facility] benefits.  We will follow-up tomorrow morning.  Length of Stay: 7  Current Medications: Scheduled Meds:   Chlorhexidine Gluconate Cloth  6 each Topical Daily   enoxaparin (LOVENOX) injection  0.5 mg/kg Subcutaneous Q24H    Continuous Infusions:  dextrose 5 % and 0.9% NaCl     lactated ringers      PRN Meds: acetaminophen, albuterol, dextromethorphan-guaiFENesin, dextrose, mouth rinse  Physical Exam Pulmonary:     Effort: Pulmonary effort is normal.  Neurological:     Mental Status: He is alert.             Vital Signs: BP (!) 147/41   Pulse 97   Temp (!) 97 F (36.1 C) (Axillary)   Resp 18   Ht 5\' 9"  (1.753 m)   Wt 105.5 kg   SpO2 91%   BMI 34.35 kg/m  SpO2: SpO2: 91 % O2 Device: O2 Device: Room Air O2 Flow Rate: O2 Flow Rate (L/min): 3 L/min  Intake/output summary:  Intake/Output Summary (Last 24 hours) at 03/31/2022 1445 Last data filed at 03/31/2022 1309 Gross per 24 hour  Intake 958.94 ml  Output 2175 ml  Net  -1216.06 ml   LBM: Last BM Date : 03/27/22 Baseline Weight: Weight: 106.6 kg Most recent weight: Weight: 105.5 kg    Patient Active Problem List   Diagnosis Date Noted   Severe sepsis with septic shock (CODE) (HCC) 03/30/2022   Complicated UTI (urinary tract infection) 03/31/2022   COVID-19 virus infection 04/13/2022   Hypoglycemia 03/27/2022   Acute metabolic  encephalopathy 04-11-2022   Depression 2022/04/11   Severe sepsis with septic shock (HCC) April 11, 2022   Aneurysm (HCC) 02/27/2022   Pressure injury of skin 02/26/2022   Acute lacunar stroke (HCC) 02/26/2022   Sepsis (HCC) 02/25/2022   Acute kidney injury (HCC) 02/24/2022   Rhabdomyolysis 02/24/2022   Generalized weakness 02/24/2022   Somnolence 02/24/2022   Sinus arrhythmia 02/24/2022   (HFpEF) heart failure with preserved ejection fraction (HCC) 02/24/2022   Fall 02/24/2022   Right leg swelling 12/03/2019   Hyperbilirubinemia 12/03/2019   Cellulitis 03/17/2018   Acute on chronic heart failure (HCC) 10/19/2017   Poor social situation 09/06/2017   Chronic diastolic heart failure (HCC)    Venous ulcer (HCC) 07/08/2017   Osteoarthritis 05/28/2017   Varicose veins of both lower extremities with pain 04/10/2017   Iron deficiency anemia 11/25/2016   Chronic venous insufficiency 10/04/2016   Lymphedema 06/08/2016   Morbid obesity (HCC) 03/26/2015   Coronary artery disease, non-occlusive 06/05/2013   Hypertension 06/05/2013   Hyperlipidemia 02/23/2012   Diabetes mellitus (HCC) 02/23/2012   Aortic valve stenosis, severe 02/23/2012    Palliative Care Assessment & Plan    Recommendations/Plan: Daughter will be leaving to come here during the night and will be here at the hospital in the morning.  Code Status:    Code Status Orders  (From admission, onward)           Start     Ordered   03/30/22 1120  Do not attempt resuscitation (DNR)  Continuous       Question Answer Comment  In the event of cardiac or  respiratory ARREST Do not call a "code blue"   In the event of cardiac or respiratory ARREST Do not perform Intubation, CPR, defibrillation or ACLS   In the event of cardiac or respiratory ARREST Use medication by any route, position, wound care, and other measures to relive pain and suffering. May use oxygen, suction and manual treatment of airway obstruction as needed for comfort.      03/30/22 1119           Code Status History     Date Active Date Inactive Code Status Order ID Comments User Context   2022-04-11 1157 03/30/2022 1119 Full Code [id number]  Lorretta Harp, MD ED   03/01/2022 1129 03/03/2022 0227 DNR [id number]  Canary Brim, NP Inpatient   02/24/2022 1954 03/01/2022 1129 Full Code [id number]  Verdene Lennert, MD ED   03/17/2018 1354 03/20/2018 2228 Full Code [id number]  Alford Highland, MD ED   09/27/2017 0734 09/29/2017 2034 Full Code [id number]  Marcelo Baldy, MD ED   08/07/2017 0428 08/10/2017 1359 Full Code [id number]  Oralia Manis, MD Inpatient   05/13/2016 0435 05/13/2016 1813 Full Code [id number]  Arnaldo Natal Inpatient   06/21/2015 0533 06/21/2015 1553 Full Code [id number]  Ihor Austin, MD ED       Prognosis: Very poor    Thank you for allowing the Palliative Medicine Team to assist in the care of this patient.    Morton Stall, NP  Please contact Palliative Medicine Team phone at [phone] for questions and concerns.

## 2022-03-31 NOTE — Progress Notes (Signed)
Dr. Allena Katz at bedside.  Order to be placed for speech therapy to clear patient to eat.  Dr. Allena Katz to talk with family about goals in care due to him refusing care.  Dr. Allena Katz acknowledged abnormal right hand and radial pulse assessment, no orders given.

## 2022-03-31 NOTE — Plan of Care (Signed)
  Problem: Clinical Measurements: Goal: Diagnostic test results will improve Outcome: Not Progressing Note: Patient's blood sugar level keeps dropping below 60. No signs of hypoglycemia noted. Treated per hypoglycemia protocol. Goal: Signs and symptoms of infection will decrease Outcome: Progressing   Problem: Respiratory: Goal: Ability to maintain adequate ventilation will improve Outcome: Progressing

## 2022-03-31 NOTE — Progress Notes (Signed)
Patient is alert to self.  Patient follows all commands when asked.  This nurse attempted to do foley care, turn and reposition patient and he refused and starting yelling out.  When this nurse asked patient was he hurting and he said yes.  Before exiting room this nurse asked if he needed anything and he said he "pepsi".  Patient also noted to have blue cyanotic finger tips on right hand and radial pulse could not be palpated but dopplered.  Left radial pulse was able to be palpated and color was within normal limits.  Blood sugar on right hand read 55 and on left read 86.  Patient does not have any symptoms of hypoglycemia. Patient is NPO.  All mentioned above will be discussed with Dr. Allena Katz.

## 2022-04-01 DIAGNOSIS — Z515 Encounter for palliative care: Secondary | ICD-10-CM | POA: Diagnosis not present

## 2022-04-01 DIAGNOSIS — Z7189 Other specified counseling: Secondary | ICD-10-CM | POA: Diagnosis not present

## 2022-04-01 DIAGNOSIS — R6521 Severe sepsis with septic shock: Secondary | ICD-10-CM | POA: Diagnosis not present

## 2022-04-01 LAB — BASIC METABOLIC PANEL
Anion gap: 4 — ABNORMAL LOW (ref 5–15)
BUN: 45 mg/dL — ABNORMAL HIGH (ref 8–23)
CO2: 28 mmol/L (ref 22–32)
Calcium: 8.1 mg/dL — ABNORMAL LOW (ref 8.9–10.3)
Chloride: 125 mmol/L — ABNORMAL HIGH (ref 98–111)
Creatinine, Ser: 1.4 mg/dL — ABNORMAL HIGH (ref 0.61–1.24)
GFR, Estimated: 48 mL/min — ABNORMAL LOW (ref >60)
Glucose, Bld: 123 mg/dL — ABNORMAL HIGH (ref 70–99)
Potassium: 3.4 mmol/L — ABNORMAL LOW (ref 3.5–5.1)
Sodium: 157 mmol/L — ABNORMAL HIGH (ref 135–145)

## 2022-04-01 LAB — CBC
HCT: 39.3 % (ref 39.0–52.0)
Hemoglobin: 12.3 g/dL — ABNORMAL LOW (ref 13.0–17.0)
MCH: 26.5 pg (ref 26.0–34.0)
MCHC: 31.3 g/dL (ref 30.0–36.0)
MCV: 84.7 fL (ref 80.0–100.0)
Platelets: 89 10*3/uL — ABNORMAL LOW (ref 150–400)
RBC: 4.64 MIL/uL (ref 4.22–5.81)
RDW: 18.2 % — ABNORMAL HIGH (ref 11.5–15.5)
WBC: 9.2 10*3/uL (ref 4.0–10.5)
nRBC: 0.3 % — ABNORMAL HIGH (ref 0.0–0.2)

## 2022-04-01 LAB — GLUCOSE, CAPILLARY
Glucose-Capillary: 100 mg/dL — ABNORMAL HIGH (ref 70–99)
Glucose-Capillary: 31 mg/dL — CL (ref 70–99)
Glucose-Capillary: 32 mg/dL — CL (ref 70–99)
Glucose-Capillary: 41 mg/dL — CL (ref 70–99)
Glucose-Capillary: 81 mg/dL (ref 70–99)
Glucose-Capillary: 85 mg/dL (ref 70–99)

## 2022-04-01 MED ORDER — MORPHINE BOLUS VIA INFUSION
1.0000 mg | INTRAVENOUS | Status: DC | PRN
  Administered 2022-04-02 (×7): 1 mg via INTRAVENOUS

## 2022-04-01 MED ORDER — GLYCOPYRROLATE 0.2 MG/ML IJ SOLN
0.2000 mg | INTRAMUSCULAR | Status: DC | PRN
  Administered 2022-04-02: 0.2 mg via INTRAVENOUS

## 2022-04-01 MED ORDER — LORAZEPAM 2 MG/ML IJ SOLN
1.0000 mg | INTRAMUSCULAR | Status: DC | PRN
  Administered 2022-04-02 (×3): 1 mg via INTRAVENOUS
  Filled 2022-04-01 (×3): qty 1

## 2022-04-01 MED ORDER — ONDANSETRON 4 MG PO TBDP: 4.0000 mg | ORAL_TABLET | Freq: Four times a day (QID) | ORAL | Status: DC | PRN

## 2022-04-01 MED ORDER — GLYCOPYRROLATE 1 MG PO TABS: 1.0000 mg | ORAL_TABLET | ORAL | Status: DC | PRN

## 2022-04-01 MED ORDER — SODIUM CHLORIDE 0.9 % IV SOLN: 250.0000 mL | INTRAVENOUS | Status: DC | PRN

## 2022-04-01 MED ORDER — ACETAMINOPHEN 650 MG RE SUPP: 650.0000 mg | Freq: Four times a day (QID) | RECTAL | Status: DC | PRN

## 2022-04-01 MED ORDER — LORAZEPAM 1 MG PO TABS: 1.0000 mg | ORAL_TABLET | ORAL | Status: DC | PRN

## 2022-04-01 MED ORDER — HALOPERIDOL LACTATE 5 MG/ML IJ SOLN: 0.5000 mg | INTRAMUSCULAR | Status: DC | PRN

## 2022-04-01 MED ORDER — ONDANSETRON HCL 4 MG/2ML IJ SOLN: 4.0000 mg | Freq: Four times a day (QID) | INTRAMUSCULAR | Status: DC | PRN

## 2022-04-01 MED ORDER — SODIUM CHLORIDE 0.9% FLUSH: 3.0000 mL | INTRAVENOUS | Status: DC | PRN

## 2022-04-01 MED ORDER — BIOTENE DRY MOUTH MT LIQD: 15.0000 mL | OROMUCOSAL | Status: DC | PRN

## 2022-04-01 MED ORDER — HALOPERIDOL LACTATE 2 MG/ML PO CONC: 0.5000 mg | ORAL | Status: DC | PRN

## 2022-04-01 MED ORDER — HALOPERIDOL 0.5 MG PO TABS: 0.5000 mg | ORAL_TABLET | ORAL | Status: DC | PRN

## 2022-04-01 MED ORDER — DEXTROSE 50 % IV SOLN
25.0000 g | INTRAVENOUS | Status: AC
  Filled 2022-04-01: qty 50

## 2022-04-01 MED ORDER — POTASSIUM CHLORIDE 10 MEQ/100ML IV SOLN
10.0000 meq | INTRAVENOUS | Status: AC
  Administered 2022-04-01 (×2): 10 meq via INTRAVENOUS
  Filled 2022-04-01 (×2): qty 100

## 2022-04-01 MED ORDER — GLYCOPYRROLATE 0.2 MG/ML IJ SOLN
0.2000 mg | INTRAMUSCULAR | Status: DC | PRN
  Filled 2022-04-01: qty 1

## 2022-04-01 MED ORDER — MORPHINE 100MG IN NS 100ML (1MG/ML) PREMIX INFUSION
1.2500 mg/h | INTRAVENOUS | Status: DC
  Administered 2022-04-01: 1 mg/h via INTRAVENOUS
  Filled 2022-04-01: qty 100

## 2022-04-01 MED ORDER — ACETAMINOPHEN 325 MG PO TABS: 650.0000 mg | ORAL_TABLET | Freq: Four times a day (QID) | ORAL | Status: DC | PRN

## 2022-04-01 MED ORDER — SODIUM CHLORIDE 0.9% FLUSH
3.0000 mL | Freq: Two times a day (BID) | INTRAVENOUS | Status: DC
  Administered 2022-04-01 (×2): 3 mL via INTRAVENOUS

## 2022-04-01 MED ORDER — LORAZEPAM 2 MG/ML PO CONC: 1.0000 mg | ORAL | Status: DC | PRN

## 2022-04-01 MED ORDER — POLYVINYL ALCOHOL 1.4 % OP SOLN: 1.0000 [drp] | Freq: Four times a day (QID) | OPHTHALMIC | Status: DC | PRN

## 2022-04-01 NOTE — Progress Notes (Signed)
Nutrition Brief Note  Chart reviewed. Pt now transitioning to comfort care.  No further nutrition interventions planned at this time.  Please re-consult as needed.   Darus Hershman W, RD, LDN, CDCES Registered Dietitian II Certified Diabetes Care and Education Specialist Please refer to [facility]ION for RD and/or RD on-call/weekend/after hours pager   

## 2022-04-01 NOTE — TOC Progression Note (Signed)
Transition of Care [facility]) - Progression Note    Patient Details  Name: [name] MRN: [id number] Date of Birth: 1931/03/19  [facility] CM/SW Contact  Allayne Butcher, RN Phone Number: 04/01/2022, 11:01 AM  Clinical Narrative:    Received a call from patient's daughter, she had originally planned to be here today but those plans fell through.  She is going to try and come tomorrow.  She will be looking for a flight.     Expected Discharge Plan: Skilled Nursing Facility Barriers to Discharge: Continued Medical Work up  Expected Discharge Plan and Services Expected Discharge Plan: Skilled Nursing Facility   Discharge Planning Services: CM Consult Post Acute Care Choice: Skilled Nursing Facility Living arrangements for the past 2 months: Single Family Home                 DME Arranged: N/A DME Agency: NA       HH Arranged: NA HH Agency: NA         Social Determinants of Health (SDOH) Interventions    Readmission Risk Interventions     No data to display

## 2022-04-01 NOTE — Progress Notes (Addendum)
Pt with CBG of 32 @1218 . Hypoglycemic standing orders activated. MD notified. 1 AMP D5 provided. Recheck CBG @ 1249 resulted at 31. Additional AMP given, MD notified. Repeat CBG result 41 at 1320. Third AMP D% administered. Follow up CBG @ 1410 resulted 100. MD notified. Pt resting comfortable in bed. Will continue to monitor.

## 2022-04-01 NOTE — Progress Notes (Signed)
[facility]  -  at [facility]   PATIENT NAME: Travaris Cocco    MR#:  [id number]  DATE OF BIRTH:  Jul 10, 1930  SUBJECTIVE:   Patient will lethargic today unable to provide any history review of systems. Sugars dipping down in the 30s. Received some dextrose and per RN. VITALS:  Blood pressure (!) 149/43, pulse (!) 54, temperature 98.7 F (37.1 C), temperature source Oral, resp. rate 20, height 5\' 9"  (1.753 m), weight 108.3 kg, SpO2 100 %.  PHYSICAL EXAMINATION:  limited due to patient participation. Chronically ill. Obese GENERAL:  88 y.o.-year-old patient lying in the bed with no acute distress.  LUNGS: Normal breath sounds bilaterally, no wheezing CARDIOVASCULAR: S1, S2 normal. No murmurs,   EXTREMITIES: +  edema b/l. Cyanotic cool NEUROLOGIC: lethargy Pressure Injury 02/25/22 Buttocks Medial Stage 1 -  Intact skin with non-blanchable redness of a localized area usually over a bony prominence. Red, flaky, dry (Active)  02/25/22 1530  Location: Buttocks  Location Orientation: Medial  Staging: Stage 1 -  Intact skin with non-blanchable redness of a localized area usually over a bony prominence.  Wound Description (Comments): Red, flaky, dry  Present on Admission: Yes     LABORATORY PANEL:  CBC Recent Labs  Lab 04/01/22 0534  WBC 9.2  HGB 12.3*  HCT 39.3  PLT 89*     Chemistries  Recent Labs  Lab 03/26/22 0359 03/27/22 0756 03/31/22 0221 04/01/22 0534  NA 143   < > 152* 157*  K 4.6   < > 3.0* 3.4*  CL 111   < > 121* 125*  CO2 22   < > 25 28  GLUCOSE 175*   < > 121* 123*  BUN 76*   < > 69* 45*  CREATININE 2.50*   < > 1.70* 1.40*  CALCIUM 7.4*   < > 7.6* 8.1*  MG 2.3   < > 2.4  --   AST 77*  --   --   --   ALT 34  --   --   --   ALKPHOS 99  --   --   --   BILITOT 1.7*  --   --   --    < > = values in this interval not displayed.     Assessment and Plan Damont Parlato is a 88 y.o. male with a past medical history significant for  indwelling foley catheter, hypertension, hyperlipidemia, asthma, stroke, depression, severe aortic stenosis, mitral valve regurgitation, CAD, HFpEF, hyperbilirubinemia, with who presents to [facility] ED due to altered mental status and generalized weakness.  Patient's chronic indwelling Foley catheter was removed, and was unable to be replaced at the facility.  He was noted to have fever and chills, and to be incontinent with malodorous urine.   Septic shock secondary to Klebsiella sepsis/UTI and COVID-19 infection  COVID-19 infection  Acute kidney injury in the setting of sepsis/ATN chronic Foley catheter  Severe Hypoglycemia  Severe Hypernatremia/hyperchloremia  Hyperlipidemia Patient has multiple comorbidities and overall has a poor prognosis. Palliative care following patient. Appreciate their input.  Discussed with patient's daughter [facility] who also discussed today with palliative care. She understands patient will not make it through this hospital stay. She is in agreement with comfort care/DNR DNI.   Family communication :dter cecilia CODE STATUS: DNR/DNI DVT Prophylaxis :lovenox Level of care: Telemetry Medical Status is: Inpatient Remains inpatient appropriate because: patient on comfort care and morphine drip  TOTAL TIME TAKING CARE OF THIS PATIENT:  35 minutes.  >50% time spent on counselling and coordination of care  Note: This dictation was prepared with Dragon dictation along with smaller phrase technology. Any transcriptional errors that result from this process are unintentional.  Fritzi Mandes M.D    [facility]   CC: Primary care physician; Leone Haven, MD
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
## 2022-04-01 NOTE — Consult Note (Addendum)
   [facility] CM Inpatient Consult   04/01/2022  Leary Millikin 08/22/1930 [id number]  [facility] [[facility]]  Accountable Care Organization [ACO] Patient: Medicare ACO REACH  [facility] Liaison remote coverage review for [facility]   *Reviewed for transitioned out of ICU, less than 30 days readmission noted  Primary Care Provider:  Glori Luis, MD, with [facility] is listed for the transition of care follow up   Patient screened for hospitalization with noted high risk score for unplanned readmission risk  and length of stay - [place] 7.  Also, to assess for potential [facility]  [[facility]] Care Management service needs for post hospital transition for care coordination.  Review of patient's electronic medical record reveals patient is recently from a skilled nursing facility.  Also, reviewed palliative notes and inpatient Transition of Care [TOC] team notes for ongoing post hospital needs.   Plan:  Continue to follow progress and disposition to assess for post hospital community care coordination/management needs.  Referral request for community care coordination: when disposition is known and if care needs assessed.  Apr 17, 2022 1506: Follow up:  Patient noted as comfort measures at this time  Of note, [facility] Care Management/Population Health does not replace or interfere with any arrangements made by the Inpatient Transition of Care team.  For questions contact:   Charlesetta Shanks, RN BSN CCM [facility]  [phone] business mobile phone Toll free office [phone]  *Concierge Line  [phone] Fax number: [phone] [email or web address]

## 2022-04-01 NOTE — Plan of Care (Signed)
Addendum: When discussing patient's children, stepdaughter states that patient does not have a healthcare power of attorney.

## 2022-04-02 DIAGNOSIS — R6521 Severe sepsis with septic shock: Secondary | ICD-10-CM | POA: Diagnosis not present

## 2022-04-02 DIAGNOSIS — Z7189 Other specified counseling: Secondary | ICD-10-CM | POA: Diagnosis not present

## 2022-04-16 NOTE — TOC Progression Note (Addendum)
Transition of Care [facility]) - Progression Note    Patient Details  Name: [name] MRN: [id number] Date of Birth: 10/07/1930  Transition of [facility]) CM/SW Contact  Tempie Hoist, [place] Phone Number: 04/13/2022, 10:36 AM  Clinical Narrative:     TOC is calling the patient's daughter regarding discharge to a LTC bed. The patient has VA benefits.  The patient will discharge home with home hospice. TOC reaching out to [facility].  1502: This patient will transfer to home hospice with [facility] tomorrow.  Expected Discharge Plan: Skilled Nursing Facility Barriers to Discharge: Continued Medical Work up  Expected Discharge Plan and Services Expected Discharge Plan: Skilled Nursing Facility   Discharge Planning Services: CM Consult Post Acute Care Choice: Skilled Nursing Facility Living arrangements for the past 2 months: Single Family Home                 DME Arranged: N/A DME Agency: NA       HH Arranged: NA HH Agency: NA         Social Determinants of Health (SDOH) Interventions    Readmission Risk Interventions     No data to display

## 2022-04-16 NOTE — Progress Notes (Signed)
[facility]  - [place] at [facility]   PATIENT NAME: [name]    MR#:  [id number]  DATE OF BIRTH:  Jan 14, 1931  SUBJECTIVE:   Patient will lethargic today unable to provide any history review of systems. Now on Comfort care with morphine gtt Spoke with dter [name] today VITALS:  Blood pressure (!) 113/44, pulse 97, temperature (!) 97 F (36.1 C), temperature source Axillary, resp. rate (!) 26, height 5\' 9"  (1.753 m), weight 108.3 kg, SpO2 97 %.  PHYSICAL EXAMINATION:  limited due to patient participation. Chronically ill. Obese GENERAL:  88 y.o.-year-old patient lying in the bed with no acute distress. Appears ill LUNGS: Normal breath sounds bilaterally CARDIOVASCULAR: S1, S2 normal. EXTREMITIES: +  edema b/l. Cyanotic cool NEUROLOGIC: lethargy Pressure Injury 02/25/22 Buttocks Medial Stage 1 -  Intact skin with non-blanchable redness of a localized area usually over a bony prominence. Red, flaky, dry (Active)  02/25/22 1530  Location: Buttocks  Location Orientation: Medial  Staging: Stage 1 -  Intact skin with non-blanchable redness of a localized area usually over a bony prominence.  Wound Description (Comments): Red, flaky, dry  Present on Admission: Yes     LABORATORY PANEL:  CBC Recent Labs  Lab 04/01/22 0534  WBC 9.2  HGB 12.3*  HCT 39.3  PLT 89*     Chemistries  Recent Labs  Lab 03/31/22 0221 04/01/22 0534  NA 152* 157*  K 3.0* 3.4*  CL 121* 125*  CO2 25 28  GLUCOSE 121* 123*  BUN 69* 45*  CREATININE 1.70* 1.40*  CALCIUM 7.6* 8.1*  MG 2.4  --      Assessment and Plan Raegan Cea is a 88 y.o. male with a past medical history significant for indwelling foley catheter, hypertension, hyperlipidemia, asthma, stroke, depression, severe aortic stenosis, mitral valve regurgitation, CAD, HFpEF, hyperbilirubinemia, with who [facility] ED due to altered mental status and generalized weakness.  Patient's chronic indwelling Foley  catheter was removed, and was unable to be replaced at the facility.  He was noted to have fever and chills, and to be incontinent with malodorous urine.   Septic shock secondary to Klebsiella sepsis/UTI and COVID-19 infection  COVID-19 infection  Acute kidney injury in the setting of sepsis/ATN chronic Foley catheter  Severe Hypoglycemia  Severe Hypernatremia/hyperchloremia  Hyperlipidemia Patient has multiple comorbidities and overall has a poor prognosis. Palliative care following patient. Appreciate their input.  Discussed with patient's daughter [name] who also discussed today with palliative care. She understands patient will not make it through this [name] stay. She is in agreement with comfort care/DNR DNI.   Family communication :dter cecilia CODE STATUS: DNR/DNI DVT Prophylaxis :lovenox Level of care: Telemetry Medical Status is: Inpatient Remains inpatient appropriate because: patient on comfort care and morphine drip  TOTAL TIME TAKING CARE OF THIS PATIENT: 25 minutes.  >50% time spent on counselling and coordination of care  Note: This dictation was prepared with Dragon dictation along with smaller phrase technology. Any transcriptional errors that result from this process are unintentional.  [name] M.D    [facility]   CC: Primary care physician; Enedina Finner, MD

## 2022-04-16 NOTE — Care Management Important Message (Signed)
Important Message  Patient Details  Name: [name] MRN: [id number] Date of Birth: [birth year]   Medicare Important Message Given:  Other (see comment)  Patient is on comfort care and out of respect to patient and family no Important Message from [facility] given.   Olegario Messier A Barbarita Hutmacher 04-03-22, 8:53 AM

## 2022-04-16 NOTE — Progress Notes (Signed)
Spoke with patient's daughter [name] via phone. Updated on patient's status. Daughter stated she wasn't sure she would be able to make it today. Tearful on the phone. Assured her patient would be kept as comfortable as possible and she would be called with any changes.

## 2022-04-16 NOTE — Progress Notes (Signed)
Patient assessed for absence of spontaneous breathing, no audible apical pulse and no palpable carotid pulse. Second verification by Verl Bangs, RN. MD Enedina Finner notified, along with Hospice Rep. Patient's daughter Mare Loan called to notify. No family will be coming to bedside. Cecilia requested to have patient taken to morgue and [facility] to call patient's brother for funeral arrangements. AC made aware. Patient prepared for morgue.

## 2022-04-16 NOTE — Progress Notes (Addendum)
SLP Cancellation Note  Patient Details Name: Cai Hufstetler MRN: [id number] DOB: 12-20-1930   Cancelled treatment:       Reason Eval/Treat Not Completed:  (chart reviewed; pt has transitioned to comfort care. ST order has been discontinued by MD.)   Jerilynn Som, MS, CCC-SLP Speech Language Pathologist Rehab Services; [facility] [phone] (ascom) [place] April 16, 2022, 8:39 AM

## 2022-04-16 NOTE — Death Summary Note (Signed)
DEATH SUMMARY   Patient Details  Name: [name] MRN: [id number] DOB: [birth year] WUJ:[id number], Jeffery Mao, MD Admission/Discharge Information   Admit Date:  April 17, 2022  Date of Death: Date of Death: Apr 26, 2022  Time of Death: Time of Death: 1825  Length of Stay: 9   Principle Cause of death: Klebsiella sepsis  Hospital Diagnoses: Principal Problem:   Severe sepsis with septic shock (CODE) (HCC) Active Problems:   Complicated UTI (urinary tract infection)   Acute kidney injury (HCC)   COVID-19 virus infection   Hyperlipidemia   Hypoglycemia   Chronic diastolic heart failure (HCC)   Acute metabolic encephalopathy   Coronary artery disease, non-occlusive   Hypertension   Hyperbilirubinemia   Depression   Severe sepsis with septic shock [facility])   Hospital Course: [name] is a 88 y.o. male with a past medical history significant for indwelling foley catheter, hypertension, hyperlipidemia, asthma, stroke, depression, severe aortic stenosis, mitral valve regurgitation, CAD, HFpEF, hyperbilirubinemia, with who presents to [facility] ED due to altered mental status and generalized weakness.  Patient's chronic indwelling Foley catheter was removed, and was unable to be replaced at the facility.  He was noted to have fever and chills, and to be incontinent with malodorous urine.    Septic shock secondary to Klebsiella sepsis/UTI and COVID-19 infection   COVID-19 infection   Acute kidney injury in the setting of sepsis/ATN chronic Foley catheter   Severe Hypoglycemia   Severe Hypernatremia/hyperchloremia   Hyperlipidemia Patient has multiple comorbidities and overall has a poor prognosis. Palliative care following patient.   Discussed with patient's daughter [name] who also discussed today with palliative care. She understands patient will not make it through this hospital stay. She is in agreement with comfort care/DNR DNI. Overall grim prognosisi. Pt was started on IV  morphine drip for comfort measures      CODE STATUS: DNR/DNI   The results of significant diagnostics from this hospitalization (including imaging, microbiology, ancillary and laboratory) are listed below for reference.   Significant Diagnostic Studies: US Venous Img Upper Uni Right(DVT)  Result Date: 03/29/2022 CLINICAL DATA:  88 year old male with right arm swelling. EXAM: RIGHT UPPER EXTREMITY VENOUS DOPPLER ULTRASOUND TECHNIQUE: Gray-scale sonography with graded compression, as well as color Doppler and duplex ultrasound were performed to evaluate the upper extremity deep venous system from the level of the subclavian vein and including the jugular, axillary, basilic, radial, ulnar and upper cephalic vein. Spectral Doppler was utilized to evaluate flow at rest and with distal augmentation maneuvers. COMPARISON:  None Available. FINDINGS: Contralateral Subclavian Vein: Respiratory phasicity is normal and symmetric with the symptomatic side. No evidence of thrombus. Normal compressibility. Internal Jugular Vein: No evidence of thrombus. Normal compressibility, respiratory phasicity and response to augmentation. Subclavian Vein: No evidence of thrombus. Normal compressibility, respiratory phasicity and response to augmentation. Axillary Vein: No evidence of thrombus. Normal compressibility, respiratory phasicity and response to augmentation. Cephalic Vein: Occlusive, heterogeneously hypoechoic thrombus peripherally, patent centrally. Basilic Vein: No evidence of thrombus. Normal compressibility, respiratory phasicity and response to augmentation. Brachial Veins: No evidence of thrombus. Normal compressibility, respiratory phasicity and response to augmentation. Radial Veins: No evidence of thrombus. Normal compressibility, respiratory phasicity and response to augmentation. Ulnar Veins: No evidence of thrombus. Normal compressibility, respiratory phasicity and response to augmentation. Other Findings:   None visualized. IMPRESSION: 1. Superficial thrombophlebitis about the peripheral right cephalic vein. The cephalic vein is patent centrally. No evidence of surrounding suppuration. 2. No evidence of right upper extremity deep vein  thrombosis. Marliss Coots, MD [facility] Electronically Signed   By: Marliss Coots M.D.   On: 03/29/2022 12:27   CT HEAD WO CONTRAST ( )  Result Date: 04/07/2022 CLINICAL DATA:  Altered mental status, generalized weakness EXAM: CT HEAD WITHOUT CONTRAST TECHNIQUE: Contiguous axial images were obtained from the base of the skull through the vertex without intravenous contrast. RADIATION DOSE REDUCTION: This exam was performed according to the departmental dose-optimization program which includes automated exposure control, adjustment of the mA and/or kV according to patient size and/or use of iterative reconstruction technique. COMPARISON:  02/24/2022 CT head FINDINGS: Evaluation is limited by motion artifact. Brain: No evidence of acute infarction, hemorrhage, mass, mass effect, or midline shift. No hydrocephalus or extra-axial fluid collection. Milder than expected cerebral atrophy for age. Vascular: No hyperdense vessel. Skull: Normal. Negative for fracture or focal lesion. Sinuses/Orbits: Postsurgical changes in the paranasal sinuses. No significant mucosal thickening. The orbits are grossly unremarkable. Other: The mastoid air cells are well aerated. IMPRESSION: Evaluation is limited by motion artifact. Within this limitation, no acute intracranial process. Electronically Signed   By: Wiliam Ke M.D.   On: 04/11/2022 19:51   DG Chest Port 1 View  Result Date: 03/19/2022 CLINICAL DATA:  Central line placement EXAM: PORTABLE CHEST 1 VIEW COMPARISON:  Chest 03/18/2022 FINDINGS: Right jugular central venous catheter tip in the mid SVC. No pneumothorax Cardiac enlargement. Pulmonary vascular congestion with progression.  Progressive mild pulmonary edema. Elevated right hemidiaphragm with right lower lobe atelectasis unchanged. No pleural effusion IMPRESSION: Satisfactory central line placement Progressive vascular congestion with mild pulmonary edema. Electronically Signed   By: Marlan Palau M.D.   On: 03/19/2022 16:27   CT ABDOMEN PELVIS WO CONTRAST  Result Date: 03/25/2022 CLINICAL DATA:  Sepsis EXAM: CT ABDOMEN AND PELVIS WITHOUT CONTRAST TECHNIQUE: Multidetector CT imaging of the abdomen and pelvis was performed following the standard protocol without IV contrast. RADIATION DOSE REDUCTION: This exam was performed according to the departmental dose-optimization program which includes automated exposure control, adjustment of the mA and/or kV according to patient size and/or use of iterative reconstruction technique. COMPARISON:  CT abdomen and pelvis dated February 24, 2022 FINDINGS: Lower chest: Right-greater-than-left bibasilar atelectasis. Coronary artery calcifications aortic valve calcifications. Hepatobiliary: No focal liver abnormality is seen. Status post cholecystectomy. No biliary dilatation. Pancreas: Unremarkable. No pancreatic ductal dilatation or surrounding inflammatory changes. Spleen: Normal in size without focal abnormality. Adrenals/Urinary Tract: Bilateral adrenal glands are unremarkable. No hydronephrosis. Bilateral renal vascular calcifications with no definite nephrolithiasis. Indeterminate exophytic lesion the interpolar region of the left kidney measuring 10 mm on series 3, image 37, simple appearing on renal ultrasound dated February 25, 2022 and likely a proteinaceous cyst. Additional simple appearing renal cysts are seen bilaterally. No specific follow-up imaging is recommended for renal lesions. Bladder is decompressed and contains a Foley catheter. Stomach/Bowel: Stomach is within normal limits. Appendix appears normal. No evidence of bowel wall thickening, distention, or inflammatory changes.  Vascular/Lymphatic: Atherosclerotic disease of the abdominal aorta infrarenal aortic aneurysm, measuring up to 5.3 cm, unchanged when compared with prior. Unchanged bilateral common iliac and left internal iliac artery aneurysms, largest is a left common iliac artery aneurysm measuring up to 4.3 cm. Reproductive: Calcifications of the prostate. Other: No abdominal wall hernia or abnormality. No abdominopelvic ascites. Musculoskeletal: No acute or significant osseous findings. IMPRESSION: 1. No acute findings in the abdomen or pelvis. 2. Infrarenal abdominal aortic aneurysm, measuring up to 5.3 cm, unchanged when compared  with prior. Recommend follow-up CT/MR every 6 months and vascular consultation. This recommendation follows ACR consensus guidelines: White Paper of the ACR Incidental Findings Committee II on Vascular Findings. J Am Coll Radiol 2013; 73:532-992. 3. Aortic Atherosclerosis (ICD10-I70.0). Electronically Signed   By: Allegra Lai M.D.   On: 2022/04/07 10:46   DG Chest Port 1 View  Result Date: 2022-04-07 CLINICAL DATA:  Questionable sepsis EXAM: PORTABLE CHEST 1 VIEW COMPARISON:  Portable exam 0850 hours compared to 02/24/2022 FINDINGS: Enlargement of cardiac silhouette with pulmonary vascular congestion. Atherosclerotic calcification aorta. Chronic eventration of RIGHT diaphragm with RIGHT basilar atelectasis. Remaining lungs clear. No acute infiltrate, pleural effusion, or pneumothorax. Osseous structures unremarkable. IMPRESSION: Enlargement of cardiac silhouette with pulmonary vascular congestion. Persistent eventration RIGHT diaphragm with RIGHT basilar atelectasis. Aortic Atherosclerosis (ICD10-I70.0). Electronically Signed   By: Ulyses Southward M.D.   On: 04-07-2022 09:02    Microbiology: No results found for this or any previous visit (from the past 240 hour(s)).  Time spent: 20 minutes  Signed: Enedina Finner, MD 03/27/2022

## 2022-04-16 DEATH — deceased

## 2022-04-24 ENCOUNTER — Other Ambulatory Visit: Payer: Self-pay | Admitting: Family

## 2022-04-24 DIAGNOSIS — I5032 Chronic diastolic (congestive) heart failure: Secondary | ICD-10-CM

## 2022-04-24 DIAGNOSIS — I350 Nonrheumatic aortic (valve) stenosis: Secondary | ICD-10-CM
